# Patient Record
Sex: Female | Born: 1940 | Race: White | Hispanic: No | Smoking: Never smoker
Health system: Southern US, Community
[De-identification: ages and names within clinical notes are randomized; demographics above are authoritative.]

## PROBLEM LIST (undated history)

## (undated) ENCOUNTER — Ambulatory Visit: Source: Home / Self Care

## (undated) DIAGNOSIS — K829 Disease of gallbladder, unspecified: Secondary | ICD-10-CM

## (undated) DIAGNOSIS — K219 Gastro-esophageal reflux disease without esophagitis: Secondary | ICD-10-CM

## (undated) DIAGNOSIS — M549 Dorsalgia, unspecified: Secondary | ICD-10-CM

## (undated) DIAGNOSIS — I499 Cardiac arrhythmia, unspecified: Secondary | ICD-10-CM

## (undated) DIAGNOSIS — R112 Nausea with vomiting, unspecified: Secondary | ICD-10-CM

## (undated) DIAGNOSIS — E669 Obesity, unspecified: Secondary | ICD-10-CM

## (undated) DIAGNOSIS — C801 Malignant (primary) neoplasm, unspecified: Secondary | ICD-10-CM

## (undated) DIAGNOSIS — C50919 Malignant neoplasm of unspecified site of unspecified female breast: Secondary | ICD-10-CM

## (undated) DIAGNOSIS — M1990 Unspecified osteoarthritis, unspecified site: Secondary | ICD-10-CM

## (undated) DIAGNOSIS — M797 Fibromyalgia: Secondary | ICD-10-CM

## (undated) DIAGNOSIS — M2550 Pain in unspecified joint: Secondary | ICD-10-CM

## (undated) DIAGNOSIS — M5430 Sciatica, unspecified side: Secondary | ICD-10-CM

## (undated) DIAGNOSIS — Z9889 Other specified postprocedural states: Secondary | ICD-10-CM

## (undated) DIAGNOSIS — E785 Hyperlipidemia, unspecified: Secondary | ICD-10-CM

## (undated) DIAGNOSIS — M5020 Other cervical disc displacement, unspecified cervical region: Secondary | ICD-10-CM

## (undated) DIAGNOSIS — Z8042 Family history of malignant neoplasm of prostate: Secondary | ICD-10-CM

## (undated) DIAGNOSIS — G2581 Restless legs syndrome: Secondary | ICD-10-CM

## (undated) DIAGNOSIS — R600 Localized edema: Secondary | ICD-10-CM

## (undated) DIAGNOSIS — M069 Rheumatoid arthritis, unspecified: Secondary | ICD-10-CM

## (undated) DIAGNOSIS — Z8719 Personal history of other diseases of the digestive system: Secondary | ICD-10-CM

## (undated) DIAGNOSIS — D649 Anemia, unspecified: Secondary | ICD-10-CM

## (undated) DIAGNOSIS — IMO0001 Reserved for inherently not codable concepts without codable children: Secondary | ICD-10-CM

## (undated) DIAGNOSIS — E119 Type 2 diabetes mellitus without complications: Secondary | ICD-10-CM

## (undated) DIAGNOSIS — Z803 Family history of malignant neoplasm of breast: Secondary | ICD-10-CM

## (undated) DIAGNOSIS — N390 Urinary tract infection, site not specified: Secondary | ICD-10-CM

## (undated) DIAGNOSIS — K5790 Diverticulosis of intestine, part unspecified, without perforation or abscess without bleeding: Secondary | ICD-10-CM

## (undated) HISTORY — DX: Restless legs syndrome: G25.81

## (undated) HISTORY — PX: INCONTINENCE SURGERY: SHX676

## (undated) HISTORY — DX: Other cervical disc displacement, unspecified cervical region: M50.20

## (undated) HISTORY — DX: Malignant neoplasm of unspecified site of unspecified female breast: C50.919

## (undated) HISTORY — DX: Disease of gallbladder, unspecified: K82.9

## (undated) HISTORY — DX: Localized edema: R60.0

## (undated) HISTORY — PX: CYSTOSCOPY WITH BIOPSY: SHX5122

## (undated) HISTORY — DX: Rheumatoid arthritis, unspecified: M06.9

## (undated) HISTORY — PX: OTHER SURGICAL HISTORY: SHX169

## (undated) HISTORY — PX: ABDOMINAL HYSTERECTOMY: SHX81

## (undated) HISTORY — DX: Unspecified osteoarthritis, unspecified site: M19.90

## (undated) HISTORY — PX: CHOLECYSTECTOMY: SHX55

## (undated) HISTORY — DX: Anemia, unspecified: D64.9

## (undated) HISTORY — PX: JOINT REPLACEMENT: SHX530

## (undated) HISTORY — DX: Family history of malignant neoplasm of prostate: Z80.42

## (undated) HISTORY — DX: Malignant (primary) neoplasm, unspecified: C80.1

## (undated) HISTORY — DX: Dorsalgia, unspecified: M54.9

## (undated) HISTORY — DX: Type 2 diabetes mellitus without complications: E11.9

## (undated) HISTORY — DX: Obesity, unspecified: E66.9

## (undated) HISTORY — PX: KNEE ARTHROSCOPY: SUR90

## (undated) HISTORY — PX: ROTATOR CUFF REPAIR: SHX139

## (undated) HISTORY — DX: Pain in unspecified joint: M25.50

## (undated) HISTORY — PX: APPENDECTOMY: SHX54

## (undated) HISTORY — DX: Family history of malignant neoplasm of breast: Z80.3

---

## 1984-11-26 HISTORY — PX: OTHER SURGICAL HISTORY: SHX169

## 1984-11-26 HISTORY — PX: BREAST SURGERY: SHX581

## 2000-02-21 ENCOUNTER — Other Ambulatory Visit: Admission: RE | Admit: 2000-02-21 | Discharge: 2000-02-21 | Payer: Self-pay | Admitting: Obstetrics & Gynecology

## 2001-03-11 ENCOUNTER — Ambulatory Visit (HOSPITAL_BASED_OUTPATIENT_CLINIC_OR_DEPARTMENT_OTHER): Admission: RE | Admit: 2001-03-11 | Discharge: 2001-03-11 | Payer: Self-pay | Admitting: Orthopaedic Surgery

## 2001-03-24 ENCOUNTER — Other Ambulatory Visit: Admission: RE | Admit: 2001-03-24 | Discharge: 2001-03-24 | Payer: Self-pay | Admitting: Obstetrics & Gynecology

## 2002-06-12 ENCOUNTER — Other Ambulatory Visit: Admission: RE | Admit: 2002-06-12 | Discharge: 2002-06-12 | Payer: Self-pay | Admitting: Obstetrics & Gynecology

## 2003-07-09 ENCOUNTER — Other Ambulatory Visit: Admission: RE | Admit: 2003-07-09 | Discharge: 2003-07-09 | Payer: Self-pay | Admitting: Obstetrics & Gynecology

## 2004-08-23 ENCOUNTER — Other Ambulatory Visit: Admission: RE | Admit: 2004-08-23 | Discharge: 2004-08-23 | Payer: Self-pay | Admitting: Obstetrics & Gynecology

## 2005-09-20 ENCOUNTER — Other Ambulatory Visit: Admission: RE | Admit: 2005-09-20 | Discharge: 2005-09-20 | Payer: Self-pay | Admitting: Obstetrics & Gynecology

## 2007-11-27 HISTORY — PX: CARDIAC CATHETERIZATION: SHX172

## 2008-04-01 ENCOUNTER — Encounter: Admission: RE | Admit: 2008-04-01 | Discharge: 2008-04-01 | Payer: Self-pay | Admitting: Neurological Surgery

## 2011-04-13 NOTE — Op Note (Signed)
[place]  Patient:    Kiara Brown, CROSBY                         MRN: [id number] Proc. Date: 03/11/01 Attending:  Lubertha Basque. Jerl Santos, M.D.                           Operative Report  PREOPERATIVE DIAGNOSES: 1. Left knee torn medial meniscus. 2. Left knee arthritis.  POSTOPERATIVE DIAGNOSES: 1. Left knee torn medial meniscus. 2. Left knee arthritis.  PROCEDURES: 1. Left knee partial medial meniscectomy. 2. Left knee removal of loose bodies. 3. Left knee thorough chondroplasty, medial femoral condyle and patella.  ANESTHESIA:  General.  SURGEON:  Lubertha Basque. Jerl Santos, M.D.  ASSISTANT:  Prince Rome, P.A.  INDICATION FOR PROCEDURE:  The patient is a 70 year old woman with a known history of inflammatory arthritis.  She has been having difficulty with severe left knee pain for many months.  This has persisted despite various oral medications and an injection of cortisone, which did afford her transient relief only.  She underwent a preoperative MRI scan, which showed perhaps some degeneration of the medial meniscus with no frank tear.  This did show an effusion.  At this point, she is offered as an arthroscopy, as this has affected her activity level and her ability to rest.  The procedure was discussed with the patient, and informed operative consent was obtained after discussion of possible complications of, reaction to anesthesia, and infection.  DESCRIPTION OF PROCEDURE:  The patient was taken to the operating suite, where general anesthetic was applied without difficulty.  She was positioned supine and prepped and draped in normal sterile fashion.  After the administration of preop IV antibiotics, an arthroscopy of the left knee was performed in two inferior portals.  The suprapatellar pouch was benign, while the patellofemoral joint exhibited some grade 3 chondromalacia on its apex, and a chondroplasty was performed back to stable  structures.  The patella did track very well.  In the medial compartment, she had some degenerative tearing of the posterior horn.  This was addressed with a 10% partial medial meniscectomy.  She had some grade 3 chondromalacia on the medial femoral condyle, but most of this was grade 2.  She had several large cartilaginous loose bodies in the knee.  A few of these were removed from the area of the notch.  The lateral compartment was benign, with no evidence of meniscal or articular cartilage injury.  The knee was thoroughly irrigated at the end of the case, followed by placement of Marcaine with epinephrine and morphine plus a cortisone preparation.  Adaptic was placed over her portals, followed by dry gauze and a loose Ace wrap.  Estimated blood loss and intraoperative fluids can be obtained from the anesthesia records.  DISPOSITION:  The patient was extubated in the operating room and taken to the recovery room in stable condition.  Plans were for her to go home the same day and follow up in the office in less than a week.  I will contact her by phone tonight. DD:  03/11/01 TD:  03/12/01 Job: [id number]

## 2012-07-07 ENCOUNTER — Other Ambulatory Visit: Payer: Self-pay | Admitting: Orthopedic Surgery

## 2012-07-07 MED ORDER — DEXAMETHASONE SODIUM PHOSPHATE 10 MG/ML IJ SOLN: 10.0000 mg | Freq: Once | INTRAMUSCULAR | Status: DC

## 2012-07-07 MED ORDER — BUPIVACAINE 0.25 % ON-Q PUMP SINGLE CATH 300ML: 300.0000 mL | INJECTION | Status: DC

## 2012-07-07 NOTE — Progress Notes (Signed)
Preoperative surgical orders have been place into the Epic hospital system for Kiara Brown on 07/07/2012, 8:24 AM  by Patrica Duel for surgery on 08/04/2012.  Preop Total Knee orders including Bupivacaine On-Q pump, IV Tylenol, and IV Decadron as long as there are no contraindications to the above medications. Avel Peace, PA-C

## 2012-07-21 ENCOUNTER — Encounter (HOSPITAL_COMMUNITY): Payer: Self-pay | Admitting: Pharmacy Technician

## 2012-07-29 ENCOUNTER — Encounter (HOSPITAL_COMMUNITY): Payer: Self-pay

## 2012-07-29 ENCOUNTER — Encounter (HOSPITAL_COMMUNITY)
Admission: RE | Admit: 2012-07-29 | Discharge: 2012-07-29 | Disposition: A | Discharge status: Home / Self Care | Payer: Medicare Other | Source: Ambulatory Visit | Attending: Orthopedic Surgery | Admitting: Orthopedic Surgery

## 2012-07-29 ENCOUNTER — Ambulatory Visit (HOSPITAL_COMMUNITY)
Admission: RE | Admit: 2012-07-29 | Discharge: 2012-07-29 | Disposition: A | Discharge status: Home / Self Care | Payer: Medicare Other | Source: Ambulatory Visit | Attending: Orthopedic Surgery | Admitting: Orthopedic Surgery

## 2012-07-29 DIAGNOSIS — M1710 Unilateral primary osteoarthritis, unspecified knee: Secondary | ICD-10-CM | POA: Insufficient documentation

## 2012-07-29 DIAGNOSIS — Z01812 Encounter for preprocedural laboratory examination: Secondary | ICD-10-CM | POA: Insufficient documentation

## 2012-07-29 HISTORY — DX: Sciatica, unspecified side: M54.30

## 2012-07-29 HISTORY — DX: Hyperlipidemia, unspecified: E78.5

## 2012-07-29 HISTORY — DX: Personal history of other diseases of the digestive system: Z87.19

## 2012-07-29 HISTORY — DX: Other specified postprocedural states: Z98.890

## 2012-07-29 HISTORY — DX: Diverticulosis of intestine, part unspecified, without perforation or abscess without bleeding: K57.90

## 2012-07-29 HISTORY — DX: Unspecified osteoarthritis, unspecified site: M19.90

## 2012-07-29 HISTORY — DX: Nausea with vomiting, unspecified: R11.2

## 2012-07-29 HISTORY — DX: Fibromyalgia: M79.7

## 2012-07-29 LAB — SURGICAL PCR SCREEN: MRSA, PCR: NEGATIVE

## 2012-07-29 LAB — COMPREHENSIVE METABOLIC PANEL
AST: 37 U/L (ref 0–37)
Albumin: 3.7 g/dL (ref 3.5–5.2)
Calcium: 9.1 mg/dL (ref 8.4–10.5)
Creatinine, Ser: 0.91 mg/dL (ref 0.50–1.10)
Total Protein: 7.1 g/dL (ref 6.0–8.3)

## 2012-07-29 LAB — URINALYSIS, ROUTINE W REFLEX MICROSCOPIC
Protein, ur: NEGATIVE mg/dL
Urobilinogen, UA: 0.2 mg/dL (ref 0.0–1.0)

## 2012-07-29 LAB — CBC
MCH: 31.4 pg (ref 26.0–34.0)
MCV: 93.6 fL (ref 78.0–100.0)
Platelets: 326 10*3/uL (ref 150–400)
RDW: 13.5 % (ref 11.5–15.5)
WBC: 6.5 10*3/uL (ref 4.0–10.5)

## 2012-07-29 LAB — URINE MICROSCOPIC-ADD ON

## 2012-07-29 LAB — PROTIME-INR: INR: 0.98 (ref 0.00–1.49)

## 2012-07-29 NOTE — Progress Notes (Signed)
07/29/12 1119  OBSTRUCTIVE SLEEP APNEA  Have you ever been diagnosed with sleep apnea through a sleep study? No  Do you snore loudly (loud enough to be heard through closed doors)?  1  Do you often feel tired, fatigued, or sleepy during the daytime? 1  Has anyone observed you stop breathing during your sleep? 0  Do you have, or are you being treated for high blood pressure? 1  BMI more than 35 kg/m2? 1  Age over 71 years old? 1  Neck circumference greater than 40 cm/18 inches? 0  Gender: 0  Obstructive Sleep Apnea Score 5   Score 4 or greater  Updated health history;Results sent to PCP

## 2012-07-29 NOTE — Patient Instructions (Addendum)
20 AMANDEEP HOGSTON  07/29/2012   Your procedure is scheduled on:  08/04/12 Monday  Surgery [id number]  Report to [facility] at    1315  PM     1:15 PM  Call this number if you have problems the morning of surgery: [phone]     Or PST   [id number]  [facility]   Remember:   Do not eat food:After Midnight. Sunday NIGHT  May have clear liquids: UNTIL  0715 AM  Monday  THEN NONE  Clear liquids include soda, tea, black coffee, apple or grape juice, broth.  Take these medicines the morning of surgery with A SIP OF WATER:     ATENOLOL, WELLBUTRIN                                 TRAMADOL IF NEEDED   Do not wear jewelry, make-up or nail polish.  Do not wear lotions, powders, or perfumes. You may wear deodorant.  Do not shave 48 hours prior to surgery.  Do not bring valuables to the hospital.  Contacts, dentures or bridgework may not be worn into surgery.  Leave suitcase in the car. After surgery it may be brought to your room.  For patients admitted to the hospital, checkout time is 11:00 AM the day of discharge.   Patients discharged the day of surgery will not be allowed to drive home.  Name and phone number of your driver: [facility]                                                                     Special Instructions: CHG Shower Use Special Wash: 1/2 bottle night before surgery and 1/2 bottle morning of surgery. REGULAR SOAP FACE AND PRIVATES              LADIES- NO SHAVING 48 HOURS BEFORE USING BETASEPT SOAP.       Please read over the following fact sheets that you were given: MRSA Information

## 2012-07-29 NOTE — Pre-Procedure Instructions (Signed)
Faxed copy of labs to Dr Lequita Halt with confirmation and request to review urine with micro and positive antibioties

## 2012-07-31 NOTE — Pre-Procedure Instructions (Signed)
Received fax from Dr Lequita Halt- no action re labs from 07/29/12

## 2012-08-03 ENCOUNTER — Other Ambulatory Visit: Payer: Self-pay | Admitting: Orthopedic Surgery

## 2012-08-03 NOTE — H&P (Signed)
Kiara Brown  DOB: 14-Mar-1941 Married / Language: English / Race: White Female  Date of Admission:  08/04/2012  Chief Complaint:  Left Knee Pain  History of Present Illness The patient is a 71 year old female who comes in for a preoperative History and Physical. The patient is scheduled for a left total knee arthroplasty to be performed by Dr. Gus Rankin. Aluisio, MD at [facility] on 08/04/2012. The patient is a 71 year old female who presents with knee complaints. The patient is seen today for a second opinion. The patient reports left knee and right knee symptoms including: pain, instability, stiffness and grinding which began 1 year(s) ago without any known injury.The patient feels that the symptoms are worsening. The patient has the current diagnosis of knee osteoarthritis. Prior to being seen today the patient was previously evaluated by a colleague. Previous work-up for this problem has included knee x-rays and arthroscopy (on the left in 2002, and right in 2004). Past treatment for this problem has included intra-articular injection of corticosteroids (as well as Synvisc. The Synvisc didn't seem to help at all. Her last cortisone injection was in April.). Current treatment includes application of ice, nonsteroidal anti-inflammatory drugs (Celebrex) and non-opioid analgesics (Tylenol). Glena Norfolk has recently been treated by Dr. Thamas Jaegers in [facility]. He did the injections. Unfortunately, her knee is at a stage where it is hurting her all the time. This is preventing her from doing things she desires. She has pain in it day and night. The left knee is far worse than the right but her right knee hurts also. The knees are limiting her activities. She was told by Dr. Thamas Jaegers that she was too obese to have the knee replacement and she would be at high risk for infection and amputation. She is subsequently here today for a second opinion. She is ready to now proceed with  surgery. They have been treated conservatively in the past for the above stated problem and despite conservative measures, they continue to have progressive pain and severe functional limitations and dysfunction. They have failed non-operative management including home exercise, medications, and injections. It is felt that they would benefit from undergoing total joint replacement. Risks and benefits of the procedure have been discussed with the patient and they elect to proceed with surgery. There are no active contraindications to surgery such as ongoing infection or rapidly progressive neurological disease.  Problem List/Past Medical Osteoarthritis, Knee (715.96) Rheumatoid Arthritis Fibromyalgia Osteoarthritis Chronic Pain Diverticulosis Hypertension Peripheral Neuropathy Hyperlipidemia. Diet-controlled  Allergies No Known Drug Allergies. 05/22/2012   Family History Diabetes Mellitus. mother, sister and brother Heart Disease. mother, father, brother, grandfather mothers side and grandfather fathers side Heart disease in female family member before age 58 Congestive Heart Failure. mother and brother Cancer. mother, sister and brother Cerebrovascular Accident. mother, brother and grandfather fathers side Chronic Obstructive Lung Disease. brother Heart disease in female family member before age 33 Hypertension. mother and brother Osteoporosis. brother Sister 1. Living. age 52, Myasthenia Gravis   Social History Exercise. Exercises weekly; does other Drug/Alcohol Rehab (Currently). no Living situation. live with spouse Illicit drug use. no Alcohol use. never consumed alcohol Current work status. retired Copywriter, advertising. 2 Tobacco use. never smoker Tobacco / smoke exposure. no Number of flights of stairs before winded. less than 1 Marital status. married Previously in rehab. no Pain Contract. no   Medication History Orencia (250MG  For Solution,  Intravenous) Active. Gabapentin (100MG  Capsule, Oral) Active. BuPROPion HCl ER (XL) (150MG  Tablet  ER 24HR, Oral) Active. CeleBREX (200MG  Capsule, Oral) Active. TraMADol HCl (50MG  Tablet, Oral) Active. Atenolol (50MG  Tablet, Oral) Active. Folic Acid (1MG  Tablet, Oral) Active. Aspirin Childrens (81MG  Tablet Chewable, Oral) Active. Vitamin ( Oral daily) Active.   Pregnancy / Birth History Pregnant. no   Past Surgical History Arthroscopy of Knee. bilateral Arthroscopy of Shoulder. right Breast Biopsy. left; Benign Gallbladder Surgery. laporoscopic Hysterectomy. Date: 66. partial (non-cancerous) Other Surgery. Ulnar nerve release on right arm Rotator Cuff Repair. right Appendectomy. Date: 53. Done at same time of Hysterectomy Cystoscopy. Benign Tumor Removal and Sling Placement   Review of Systems General:Present- Weight Gain. Not Present- Chills, Fever, Night Sweats, Appetite Loss, Fatigue, Feeling sick and Weight Loss. Skin:Not Present- Itching, Rash, Skin Color Changes, Ulcer, Psoriasis and Change in Hair or Nails. HEENT:Not Present- Sensitivity to light, Nose Bleed, Visual Loss, Decreased Hearing and Ringing in the Ears. Neck:Not Present- Swollen Glands and Neck Mass. Respiratory:Present- Shortness of breath. Not Present- Snoring, Chronic Cough and Bloody sputum. Cardiovascular:Present- Shortness of Breath and Swelling of Extremities. Not Present- Chest Pain, Leg Cramps and Palpitations. Gastrointestinal:Present- Heartburn. Not Present- Bloody Stool, Abdominal Pain, Vomiting, Nausea and Incontinence of Stool. Musculoskeletal:Present- Joint Stiffness, Joint Swelling, Joint Pain and Back Pain. Not Present- Muscle Weakness and Muscle Pain. Neurological:Present- Tingling, Numbness and Burning. Not Present- Tremor, Headaches and Dizziness. Psychiatric:Not Present- Anxiety, Depression and Memory Loss. Endocrine:Not Present- Cold Intolerance, Heat Intolerance,  Excessive hunger and Excessive Thirst.   Vitals Weight: 240 lb Height: 64 in Body Surface Area: 2.22 m Body Mass Index: 41.2 kg/m Pulse: 76 (Regular) Resp.: 12 (Unlabored) BP: 142/82 (Sitting, Right Arm, Standard)   Physical Exam The physical exam findings are as follows:   General Mental Status - Alert, cooperative and good historian. General Appearance- pleasant. Not in acute distress. Orientation- Oriented X3. Build & Nutrition- Well nourished and Well developed.   Head and Neck Head- normocephalic, atraumatic . Neck Global Assessment- supple. no bruit auscultated on the right and no bruit auscultated on the left.   Eye Pupil- Bilateral- Regular and Round. Motion- Bilateral- EOMI. Patient does wear contacts.  Chest and Lung Exam Auscultation: Breath sounds:- clear at anterior chest wall and - clear at posterior chest wall. Adventitious sounds:- No Adventitious sounds.   Cardiovascular Auscultation:Rhythm- Regular rate and rhythm. Heart Sounds- S1 WNL and S2 WNL. Murmurs & Other Heart Sounds:Auscultation of the heart reveals - No Murmurs.   Abdomen Inspection:Contour- Generalized moderate distention. Palpation/Percussion:Tenderness- Abdomen is non-tender to palpation. Rigidity (guarding)- Abdomen is soft. Auscultation:Auscultation of the abdomen reveals - Bowel sounds normal.   Female Genitourinary Not done, not pertinent to present illness  Musculoskeletal She is a very pleasant, well developed female who is alert and oriented. No apparent distress. Both knees show no effusion. She does have significant morbid obesity with a lot of adipose distribution about her knees. Range is about 5-120 on each side. There is marked crepitus on range of motion. She does not have any peripheral edema. There is no instability noted about the knees.  RADIOGRAPHS: AP and lateral of both knees show advanced endstage arthritis in  both, mainly affecting the medial and patellofemoral compartments with bone on bone changes. She is a little worse on the left than the right. She also has tibial subluxation.  Assessment & Plan Osteoarthritis, Knee (715.96) Impression: Left Knee  Note: Patient is for a left total knee repalcement by Dr. Lequita Halt.  Patient wants to look into rehab.  PCP - Dr. Gildardo Griffes - Patient has  been seen preoperatively and felt to be stable for surgery.  Signed electronically by Roberts Gaudy, PA-C

## 2012-08-04 ENCOUNTER — Encounter (HOSPITAL_COMMUNITY): Payer: Self-pay | Admitting: Anesthesiology

## 2012-08-04 ENCOUNTER — Encounter (HOSPITAL_COMMUNITY): Payer: Self-pay | Admitting: *Deleted

## 2012-08-04 ENCOUNTER — Encounter (HOSPITAL_COMMUNITY)
Admission: RE | Disposition: A | Discharge status: Skilled Nursing Facility | Payer: Self-pay | Source: Ambulatory Visit | Attending: Orthopedic Surgery

## 2012-08-04 ENCOUNTER — Inpatient Hospital Stay (HOSPITAL_COMMUNITY): Payer: Medicare Other | Admitting: Anesthesiology

## 2012-08-04 ENCOUNTER — Inpatient Hospital Stay (HOSPITAL_COMMUNITY)
Admission: RE | Admit: 2012-08-04 | Discharge: 2012-08-07 | DRG: 470 | Disposition: A | Discharge status: Skilled Nursing Facility | Payer: Medicare Other | Source: Ambulatory Visit | Attending: Orthopedic Surgery | Admitting: Orthopedic Surgery

## 2012-08-04 DIAGNOSIS — Z96659 Presence of unspecified artificial knee joint: Secondary | ICD-10-CM

## 2012-08-04 DIAGNOSIS — Z6841 Body Mass Index (BMI) 40.0 and over, adult: Secondary | ICD-10-CM

## 2012-08-04 DIAGNOSIS — Z7982 Long term (current) use of aspirin: Secondary | ICD-10-CM

## 2012-08-04 DIAGNOSIS — Z90710 Acquired absence of both cervix and uterus: Secondary | ICD-10-CM

## 2012-08-04 DIAGNOSIS — G5600 Carpal tunnel syndrome, unspecified upper limb: Secondary | ICD-10-CM | POA: Diagnosis present

## 2012-08-04 DIAGNOSIS — M069 Rheumatoid arthritis, unspecified: Secondary | ICD-10-CM | POA: Diagnosis present

## 2012-08-04 DIAGNOSIS — M1710 Unilateral primary osteoarthritis, unspecified knee: Principal | ICD-10-CM | POA: Diagnosis present

## 2012-08-04 DIAGNOSIS — R110 Nausea: Secondary | ICD-10-CM | POA: Diagnosis not present

## 2012-08-04 DIAGNOSIS — M179 Osteoarthritis of knee, unspecified: Secondary | ICD-10-CM | POA: Diagnosis present

## 2012-08-04 DIAGNOSIS — Z79899 Other long term (current) drug therapy: Secondary | ICD-10-CM

## 2012-08-04 DIAGNOSIS — G8929 Other chronic pain: Secondary | ICD-10-CM | POA: Diagnosis present

## 2012-08-04 DIAGNOSIS — K449 Diaphragmatic hernia without obstruction or gangrene: Secondary | ICD-10-CM | POA: Diagnosis present

## 2012-08-04 DIAGNOSIS — I10 Essential (primary) hypertension: Secondary | ICD-10-CM | POA: Diagnosis present

## 2012-08-04 DIAGNOSIS — Z9089 Acquired absence of other organs: Secondary | ICD-10-CM

## 2012-08-04 DIAGNOSIS — M5430 Sciatica, unspecified side: Secondary | ICD-10-CM | POA: Diagnosis present

## 2012-08-04 DIAGNOSIS — E785 Hyperlipidemia, unspecified: Secondary | ICD-10-CM | POA: Diagnosis present

## 2012-08-04 DIAGNOSIS — G609 Hereditary and idiopathic neuropathy, unspecified: Secondary | ICD-10-CM | POA: Diagnosis present

## 2012-08-04 DIAGNOSIS — E876 Hypokalemia: Secondary | ICD-10-CM | POA: Diagnosis not present

## 2012-08-04 DIAGNOSIS — Z8719 Personal history of other diseases of the digestive system: Secondary | ICD-10-CM

## 2012-08-04 DIAGNOSIS — G4730 Sleep apnea, unspecified: Secondary | ICD-10-CM | POA: Diagnosis present

## 2012-08-04 DIAGNOSIS — E871 Hypo-osmolality and hyponatremia: Secondary | ICD-10-CM | POA: Diagnosis not present

## 2012-08-04 HISTORY — PX: TOTAL KNEE ARTHROPLASTY: SHX125

## 2012-08-04 SURGERY — ARTHROPLASTY, KNEE, TOTAL
Anesthesia: Spinal | Site: Knee | Laterality: Left | Wound class: Clean

## 2012-08-04 MED ORDER — CEFAZOLIN SODIUM-DEXTROSE 2-3 GM-% IV SOLR
2.0000 g | Freq: Four times a day (QID) | INTRAVENOUS | Status: AC
  Administered 2012-08-04 – 2012-08-05 (×2): 2 g via INTRAVENOUS
  Filled 2012-08-04 (×2): qty 50

## 2012-08-04 MED ORDER — ONDANSETRON HCL 4 MG/2ML IJ SOLN: 4.0000 mg | Freq: Four times a day (QID) | INTRAMUSCULAR | Status: DC | PRN

## 2012-08-04 MED ORDER — ONDANSETRON HCL 4 MG PO TABS: 4.0000 mg | ORAL_TABLET | Freq: Four times a day (QID) | ORAL | Status: DC | PRN

## 2012-08-04 MED ORDER — DIPHENHYDRAMINE HCL 12.5 MG/5ML PO ELIX
12.5000 mg | ORAL_SOLUTION | Freq: Four times a day (QID) | ORAL | Status: DC | PRN
  Filled 2012-08-04: qty 5

## 2012-08-04 MED ORDER — SODIUM CHLORIDE 0.9 % IR SOLN
Status: DC | PRN
  Administered 2012-08-04: 1000 mL

## 2012-08-04 MED ORDER — PHENYLEPHRINE HCL 10 MG/ML IJ SOLN
INTRAMUSCULAR | Status: DC | PRN
  Administered 2012-08-04: 80 ug via INTRAVENOUS
  Administered 2012-08-04: 160 ug via INTRAVENOUS

## 2012-08-04 MED ORDER — OXYCODONE HCL 5 MG PO TABS
5.0000 mg | ORAL_TABLET | ORAL | Status: DC | PRN
  Administered 2012-08-04 – 2012-08-05 (×3): 5 mg via ORAL
  Administered 2012-08-05 (×4): 10 mg via ORAL
  Administered 2012-08-06 (×2): 5 mg via ORAL
  Filled 2012-08-04: qty 2
  Filled 2012-08-04 (×3): qty 1
  Filled 2012-08-04: qty 2
  Filled 2012-08-04: qty 1
  Filled 2012-08-04: qty 2
  Filled 2012-08-04: qty 1
  Filled 2012-08-04: qty 2

## 2012-08-04 MED ORDER — MEPERIDINE HCL 50 MG/ML IJ SOLN
6.2500 mg | INTRAMUSCULAR | Status: DC | PRN
  Administered 2012-08-04: 12.5 mg via INTRAVENOUS

## 2012-08-04 MED ORDER — RIVAROXABAN 10 MG PO TABS
10.0000 mg | ORAL_TABLET | Freq: Every day | ORAL | Status: DC
  Administered 2012-08-05 – 2012-08-07 (×3): 10 mg via ORAL
  Filled 2012-08-04 (×5): qty 1

## 2012-08-04 MED ORDER — PHENOL 1.4 % MT LIQD
1.0000 | OROMUCOSAL | Status: DC | PRN
Start: 2012-08-04 — End: 2012-08-07
  Filled 2012-08-04: qty 177

## 2012-08-04 MED ORDER — PROMETHAZINE HCL 25 MG/ML IJ SOLN: 6.2500 mg | INTRAMUSCULAR | Status: DC | PRN

## 2012-08-04 MED ORDER — BUPIVACAINE ON-Q PAIN PUMP (FOR ORDER SET NO CHG)
INJECTION | Status: DC
  Filled 2012-08-04: qty 1

## 2012-08-04 MED ORDER — LACTATED RINGERS IV SOLN
INTRAVENOUS | Status: DC | PRN
  Administered 2012-08-04 (×3): via INTRAVENOUS

## 2012-08-04 MED ORDER — LACTATED RINGERS IV SOLN: INTRAVENOUS | Status: DC

## 2012-08-04 MED ORDER — MORPHINE SULFATE (PF) 1 MG/ML IV SOLN
INTRAVENOUS | Status: DC
  Administered 2012-08-04: 22:00:00 via INTRAVENOUS
  Administered 2012-08-04: 14 mg via INTRAVENOUS
  Administered 2012-08-05: 16 mg via INTRAVENOUS
  Filled 2012-08-04: qty 25

## 2012-08-04 MED ORDER — MORPHINE SULFATE (PF) 1 MG/ML IV SOLN
INTRAVENOUS | Status: DC
  Administered 2012-08-04: 1 mg via INTRAVENOUS

## 2012-08-04 MED ORDER — POLYETHYLENE GLYCOL 3350 17 G PO PACK: 17.0000 g | PACK | Freq: Every day | ORAL | Status: DC | PRN

## 2012-08-04 MED ORDER — ACETAMINOPHEN 10 MG/ML IV SOLN
1000.0000 mg | Freq: Four times a day (QID) | INTRAVENOUS | Status: AC
  Administered 2012-08-04 – 2012-08-05 (×4): 1000 mg via INTRAVENOUS
  Filled 2012-08-04 (×7): qty 100

## 2012-08-04 MED ORDER — SODIUM CHLORIDE 0.9 % IV SOLN: INTRAVENOUS | Status: DC

## 2012-08-04 MED ORDER — DIPHENHYDRAMINE HCL 12.5 MG/5ML PO ELIX: 12.5000 mg | ORAL_SOLUTION | Freq: Four times a day (QID) | ORAL | Status: DC | PRN

## 2012-08-04 MED ORDER — DOCUSATE SODIUM 100 MG PO CAPS
100.0000 mg | ORAL_CAPSULE | Freq: Two times a day (BID) | ORAL | Status: DC
Start: 2012-08-04 — End: 2012-08-07
  Administered 2012-08-04 – 2012-08-06 (×5): 100 mg via ORAL

## 2012-08-04 MED ORDER — METHOCARBAMOL 500 MG PO TABS
500.0000 mg | ORAL_TABLET | Freq: Four times a day (QID) | ORAL | Status: DC | PRN
  Administered 2012-08-05 – 2012-08-07 (×6): 500 mg via ORAL
  Filled 2012-08-04 (×7): qty 1

## 2012-08-04 MED ORDER — MENTHOL 3 MG MT LOZG
1.0000 | LOZENGE | OROMUCOSAL | Status: DC | PRN
  Filled 2012-08-04: qty 9

## 2012-08-04 MED ORDER — GABAPENTIN 100 MG PO CAPS
100.0000 mg | ORAL_CAPSULE | Freq: Every day | ORAL | Status: DC
  Administered 2012-08-04 – 2012-08-06 (×3): 100 mg via ORAL
  Filled 2012-08-04 (×4): qty 1

## 2012-08-04 MED ORDER — SODIUM CHLORIDE 0.9 % IJ SOLN: 9.0000 mL | INTRAMUSCULAR | Status: DC | PRN

## 2012-08-04 MED ORDER — METOCLOPRAMIDE HCL 5 MG/ML IJ SOLN
5.0000 mg | Freq: Three times a day (TID) | INTRAMUSCULAR | Status: DC | PRN
  Administered 2012-08-05: 10 mg via INTRAVENOUS
  Filled 2012-08-04: qty 2

## 2012-08-04 MED ORDER — BUPIVACAINE IN DEXTROSE 0.75-8.25 % IT SOLN
INTRATHECAL | Status: DC | PRN
  Administered 2012-08-04: 1.8 mL via INTRATHECAL

## 2012-08-04 MED ORDER — HYDROMORPHONE HCL PF 1 MG/ML IJ SOLN: 0.2500 mg | INTRAMUSCULAR | Status: DC | PRN

## 2012-08-04 MED ORDER — DIPHENHYDRAMINE HCL 50 MG/ML IJ SOLN: 12.5000 mg | Freq: Four times a day (QID) | INTRAMUSCULAR | Status: DC | PRN

## 2012-08-04 MED ORDER — TRAMADOL HCL 50 MG PO TABS
50.0000 mg | ORAL_TABLET | Freq: Four times a day (QID) | ORAL | Status: DC | PRN
  Administered 2012-08-06: 50 mg via ORAL
  Filled 2012-08-04: qty 1

## 2012-08-04 MED ORDER — 0.9 % SODIUM CHLORIDE (POUR BTL) OPTIME
TOPICAL | Status: DC | PRN
  Administered 2012-08-04: 1000 mL

## 2012-08-04 MED ORDER — NALOXONE HCL 0.4 MG/ML IJ SOLN: 0.4000 mg | INTRAMUSCULAR | Status: DC | PRN

## 2012-08-04 MED ORDER — FENTANYL CITRATE 0.05 MG/ML IJ SOLN
INTRAMUSCULAR | Status: DC | PRN
  Administered 2012-08-04: 50 ug via INTRAVENOUS

## 2012-08-04 MED ORDER — MIDAZOLAM HCL 5 MG/5ML IJ SOLN
INTRAMUSCULAR | Status: DC | PRN
  Administered 2012-08-04: 2 mg via INTRAVENOUS

## 2012-08-04 MED ORDER — MEPERIDINE HCL 50 MG/ML IJ SOLN
INTRAMUSCULAR | Status: AC
  Filled 2012-08-04: qty 1

## 2012-08-04 MED ORDER — METOCLOPRAMIDE HCL 10 MG PO TABS
5.0000 mg | ORAL_TABLET | Freq: Three times a day (TID) | ORAL | Status: DC | PRN
  Administered 2012-08-05: 10 mg via ORAL
  Filled 2012-08-04: qty 1

## 2012-08-04 MED ORDER — ACETAMINOPHEN 650 MG RE SUPP: 650.0000 mg | Freq: Four times a day (QID) | RECTAL | Status: DC | PRN

## 2012-08-04 MED ORDER — PROPOFOL INFUSION 10 MG/ML OPTIME
INTRAVENOUS | Status: DC | PRN
  Administered 2012-08-04: 100 ug/kg/min via INTRAVENOUS

## 2012-08-04 MED ORDER — LIDOCAINE HCL (CARDIAC) 20 MG/ML IV SOLN
INTRAVENOUS | Status: DC | PRN
  Administered 2012-08-04: 100 mg via INTRAVENOUS

## 2012-08-04 MED ORDER — POTASSIUM CHLORIDE IN NACL 20-0.9 MEQ/L-% IV SOLN
INTRAVENOUS | Status: DC
  Administered 2012-08-05: 04:00:00 via INTRAVENOUS
  Filled 2012-08-04 (×4): qty 1000

## 2012-08-04 MED ORDER — ACETAMINOPHEN 325 MG PO TABS: 650.0000 mg | ORAL_TABLET | Freq: Four times a day (QID) | ORAL | Status: DC | PRN

## 2012-08-04 MED ORDER — BUPIVACAINE 0.25 % ON-Q PUMP SINGLE CATH 100 ML
INJECTION | Status: DC | PRN
  Administered 2012-08-04: 100 mL

## 2012-08-04 MED ORDER — ATENOLOL 50 MG PO TABS
50.0000 mg | ORAL_TABLET | Freq: Every day | ORAL | Status: DC
  Administered 2012-08-05 – 2012-08-07 (×3): 50 mg via ORAL
  Filled 2012-08-04 (×4): qty 1

## 2012-08-04 MED ORDER — DIPHENHYDRAMINE HCL 12.5 MG/5ML PO ELIX: 12.5000 mg | ORAL_SOLUTION | ORAL | Status: DC | PRN

## 2012-08-04 MED ORDER — ONDANSETRON HCL 4 MG/2ML IJ SOLN
4.0000 mg | Freq: Four times a day (QID) | INTRAMUSCULAR | Status: DC | PRN
  Administered 2012-08-04 – 2012-08-05 (×2): 4 mg via INTRAVENOUS
  Filled 2012-08-04 (×2): qty 2

## 2012-08-04 MED ORDER — DEXTROSE 5 % IV SOLN
3.0000 g | INTRAVENOUS | Status: AC
  Administered 2012-08-04: 2 g via INTRAVENOUS
  Filled 2012-08-04: qty 3000

## 2012-08-04 MED ORDER — EPHEDRINE SULFATE 50 MG/ML IJ SOLN
INTRAMUSCULAR | Status: DC | PRN
  Administered 2012-08-04: 5 mg via INTRAVENOUS
  Administered 2012-08-04: 10 mg via INTRAVENOUS

## 2012-08-04 MED ORDER — ACETAMINOPHEN 10 MG/ML IV SOLN
1000.0000 mg | Freq: Once | INTRAVENOUS | Status: AC
  Administered 2012-08-04: 1000 mg via INTRAVENOUS

## 2012-08-04 MED ORDER — BUPROPION HCL ER (XL) 150 MG PO TB24
150.0000 mg | ORAL_TABLET | Freq: Every day | ORAL | Status: DC
  Administered 2012-08-05 – 2012-08-07 (×3): 150 mg via ORAL
  Filled 2012-08-04 (×4): qty 1

## 2012-08-04 MED ORDER — METHOCARBAMOL 100 MG/ML IJ SOLN
500.0000 mg | Freq: Four times a day (QID) | INTRAVENOUS | Status: DC | PRN
  Administered 2012-08-04: 500 mg via INTRAVENOUS
  Filled 2012-08-04: qty 5

## 2012-08-04 MED ORDER — FLEET ENEMA 7-19 GM/118ML RE ENEM: 1.0000 | ENEMA | Freq: Once | RECTAL | Status: AC | PRN

## 2012-08-04 MED ORDER — BISACODYL 10 MG RE SUPP: 10.0000 mg | Freq: Every day | RECTAL | Status: DC | PRN

## 2012-08-04 SURGICAL SUPPLY — 56 items
BAG SPEC THK2 15X12 ZIP CLS (MISCELLANEOUS) ×1
BAG ZIPLOCK 12X15 (MISCELLANEOUS) ×2 IMPLANT
BANDAGE ELASTIC 6 VELCRO ST LF (GAUZE/BANDAGES/DRESSINGS) ×2 IMPLANT
BANDAGE ESMARK 6X9 LF (GAUZE/BANDAGES/DRESSINGS) ×1 IMPLANT
BLADE SAG 18X100X1.27 (BLADE) ×2 IMPLANT
BLADE SAW SGTL 11.0X1.19X90.0M (BLADE) ×2 IMPLANT
BNDG CMPR 9X6 STRL LF SNTH (GAUZE/BANDAGES/DRESSINGS) ×1
BNDG ESMARK 6X9 LF (GAUZE/BANDAGES/DRESSINGS) ×2
BOWL SMART MIX CTS (DISPOSABLE) ×2 IMPLANT
CATH KIT ON-Q SILVERSOAK 5 (CATHETERS) ×1 IMPLANT
CATH KIT ON-Q SILVERSOAK 5IN (CATHETERS) ×2 IMPLANT
CEMENT HV SMART SET (Cement) ×4 IMPLANT
CLOTH BEACON ORANGE TIMEOUT ST (SAFETY) ×2 IMPLANT
CUFF TOURN SGL QUICK 34 (TOURNIQUET CUFF)
CUFF TOURN SGL QUICK 44 (TOURNIQUET CUFF) ×1 IMPLANT
CUFF TRNQT CYL 34X4X40X1 (TOURNIQUET CUFF) ×1 IMPLANT
DRAPE EXTREMITY T 121X128X90 (DRAPE) ×2 IMPLANT
DRAPE POUCH INSTRU U-SHP 10X18 (DRAPES) ×2 IMPLANT
DRAPE U-SHAPE 47X51 STRL (DRAPES) ×2 IMPLANT
DRSG ADAPTIC 3X8 NADH LF (GAUZE/BANDAGES/DRESSINGS) ×2 IMPLANT
DURAPREP 26ML APPLICATOR (WOUND CARE) ×2 IMPLANT
ELECT REM PT RETURN 9FT ADLT (ELECTROSURGICAL) ×2
ELECTRODE REM PT RTRN 9FT ADLT (ELECTROSURGICAL) ×1 IMPLANT
EVACUATOR 1/8 PVC DRAIN (DRAIN) ×2 IMPLANT
FACESHIELD LNG OPTICON STERILE (SAFETY) ×10 IMPLANT
GLOVE BIO SURGEON STRL SZ8 (GLOVE) ×2 IMPLANT
GLOVE BIOGEL PI IND STRL 6.5 (GLOVE) IMPLANT
GLOVE BIOGEL PI IND STRL 8 (GLOVE) ×2 IMPLANT
GLOVE BIOGEL PI INDICATOR 6.5 (GLOVE) ×1
GLOVE BIOGEL PI INDICATOR 8 (GLOVE) ×2
GLOVE ECLIPSE 8.0 STRL XLNG CF (GLOVE) ×2 IMPLANT
GLOVE SURG SS PI 6.5 STRL IVOR (GLOVE) ×5 IMPLANT
GOWN STRL NON-REIN LRG LVL3 (GOWN DISPOSABLE) ×4 IMPLANT
GOWN STRL REIN XL XLG (GOWN DISPOSABLE) ×3 IMPLANT
HANDPIECE INTERPULSE COAX TIP (DISPOSABLE) ×2
IMMOBILIZER KNEE 20 (SOFTGOODS) ×2
IMMOBILIZER KNEE 20 THIGH 36 (SOFTGOODS) ×1 IMPLANT
KIT BASIN OR (CUSTOM PROCEDURE TRAY) ×2 IMPLANT
MANIFOLD NEPTUNE II (INSTRUMENTS) ×2 IMPLANT
NS IRRIG 1000ML POUR BTL (IV SOLUTION) ×2 IMPLANT
PACK TOTAL JOINT (CUSTOM PROCEDURE TRAY) ×2 IMPLANT
PAD ABD 7.5X8 STRL (GAUZE/BANDAGES/DRESSINGS) ×2 IMPLANT
PADDING CAST COTTON 6X4 STRL (CAST SUPPLIES) ×4 IMPLANT
POSITIONER SURGICAL ARM (MISCELLANEOUS) ×2 IMPLANT
SET HNDPC FAN SPRY TIP SCT (DISPOSABLE) ×1 IMPLANT
SPONGE GAUZE 4X4 12PLY (GAUZE/BANDAGES/DRESSINGS) ×2 IMPLANT
STRIP CLOSURE SKIN 1/2X4 (GAUZE/BANDAGES/DRESSINGS) ×3 IMPLANT
SUCTION FRAZIER 12FR DISP (SUCTIONS) ×2 IMPLANT
SUT MNCRL AB 4-0 PS2 18 (SUTURE) ×2 IMPLANT
SUT VIC AB 2-0 CT1 27 (SUTURE) ×6
SUT VIC AB 2-0 CT1 TAPERPNT 27 (SUTURE) ×3 IMPLANT
SUT VLOC 180 0 24IN GS25 (SUTURE) ×2 IMPLANT
TOWEL OR 17X26 10 PK STRL BLUE (TOWEL DISPOSABLE) ×4 IMPLANT
TRAY FOLEY CATH 14FRSI W/METER (CATHETERS) ×2 IMPLANT
WATER STERILE IRR 1500ML POUR (IV SOLUTION) ×2 IMPLANT
WRAP KNEE MAXI GEL POST OP (GAUZE/BANDAGES/DRESSINGS) ×4 IMPLANT

## 2012-08-04 NOTE — Interval H&P Note (Signed)
History and Physical Interval Note:  08/04/2012 2:16 PM  Kiara Brown  has presented today for surgery, with the diagnosis of Osteoarthritis of the Left Knee  The various methods of treatment have been discussed with the patient and family. After consideration of risks, benefits and other options for treatment, the patient has consented to  Procedure(s) (LRB) with comments: TOTAL KNEE ARTHROPLASTY (Left) as a surgical intervention .  The patient's history has been reviewed, patient examined, no change in status, stable for surgery.  I have reviewed the patient's chart and labs.  Questions were answered to the patient's satisfaction.     Loanne Drilling

## 2012-08-04 NOTE — H&P (View-Only) (Signed)
Kiara Brown  DOB: 08/01/1941 Married / Language: English / Race: White Female  Date of Admission:  08/04/2012  Chief Complaint:  Left Knee Pain  History of Present Illness The patient is a 71 year old female who comes in for a preoperative History and Physical. The patient is scheduled for a left total knee arthroplasty to be performed by Dr. Frank V. Aluisio, MD at [facility] on 08/04/2012. The patient is a 70 year old female who presents with knee complaints. The patient is seen today for a second opinion. The patient reports left knee and right knee symptoms including: pain, instability, stiffness and grinding which began 1 year(s) ago without any known injury.The patient feels that the symptoms are worsening. The patient has the current diagnosis of knee osteoarthritis. Prior to being seen today the patient was previously evaluated by a colleague. Previous work-up for this problem has included knee x-rays and arthroscopy (on the left in 2002, and right in 2004). Past treatment for this problem has included intra-articular injection of corticosteroids (as well as Synvisc. The Synvisc didn't seem to help at all. Her last cortisone injection was in April.). Current treatment includes application of ice, nonsteroidal anti-inflammatory drugs (Celebrex) and non-opioid analgesics (Tylenol). [name] has recently been treated by Dr. Lennon in [place]. He did the injections. Unfortunately, her knee is at a stage where it is hurting her all the time. This is preventing her from doing things she desires. She has pain in it day and night. The left knee is far worse than the right but her right knee hurts also. The knees are limiting her activities. She was told by Dr. Lennon that she was too obese to have the knee replacement and she would be at high risk for infection and amputation. She is subsequently here today for a second opinion. She is ready to now proceed with  surgery. They have been treated conservatively in the past for the above stated problem and despite conservative measures, they continue to have progressive pain and severe functional limitations and dysfunction. They have failed non-operative management including home exercise, medications, and injections. It is felt that they would benefit from undergoing total joint replacement. Risks and benefits of the procedure have been discussed with the patient and they elect to proceed with surgery. There are no active contraindications to surgery such as ongoing infection or rapidly progressive neurological disease.  Problem List/Past Medical Osteoarthritis, Knee (715.96) Rheumatoid Arthritis Fibromyalgia Osteoarthritis Chronic Pain Diverticulosis Hypertension Peripheral Neuropathy Hyperlipidemia. Diet-controlled  Allergies No Known Drug Allergies. 05/22/2012   Family History Diabetes Mellitus. mother, sister and brother Heart Disease. mother, father, brother, grandfather mothers side and grandfather fathers side Heart disease in female family member before age 65 Congestive Heart Failure. mother and brother Cancer. mother, sister and brother Cerebrovascular Accident. mother, brother and grandfather fathers side Chronic Obstructive Lung Disease. brother Heart disease in female family member before age 55 Hypertension. mother and brother Osteoporosis. brother Sister 1. Living. age 66, Myasthenia Gravis   Social History Exercise. Exercises weekly; does other Drug/Alcohol Rehab (Currently). no Living situation. live with spouse Illicit drug use. no Alcohol use. never consumed alcohol Current work status. retired Children. 2 Tobacco use. never smoker Tobacco / smoke exposure. no Number of flights of stairs before winded. less than 1 Marital status. married Previously in rehab. no Pain Contract. no   Medication History Orencia (250MG For Solution,  Intravenous) Active. Gabapentin (100MG Capsule, Oral) Active. BuPROPion HCl ER (XL) (150MG Tablet   ER 24HR, Oral) Active. CeleBREX (200MG Capsule, Oral) Active. TraMADol HCl (50MG Tablet, Oral) Active. Atenolol (50MG Tablet, Oral) Active. Folic Acid (1MG Tablet, Oral) Active. Aspirin Childrens (81MG Tablet Chewable, Oral) Active. Vitamin ( Oral daily) Active.   Pregnancy / Birth History Pregnant. no   Past Surgical History Arthroscopy of Knee. bilateral Arthroscopy of Shoulder. right Breast Biopsy. left; Benign Gallbladder Surgery. laporoscopic Hysterectomy. Date: 1981. partial (non-cancerous) Other Surgery. Ulnar nerve release on right arm Rotator Cuff Repair. right Appendectomy. Date: 1981. Done at same time of Hysterectomy Cystoscopy. Benign Tumor Removal and Sling Placement   Review of Systems General:Present- Weight Gain. Not Present- Chills, Fever, Night Sweats, Appetite Loss, Fatigue, Feeling sick and Weight Loss. Skin:Not Present- Itching, Rash, Skin Color Changes, Ulcer, Psoriasis and Change in Hair or Nails. HEENT:Not Present- Sensitivity to light, Nose Bleed, Visual Loss, Decreased Hearing and Ringing in the Ears. Neck:Not Present- Swollen Glands and Neck Mass. Respiratory:Present- Shortness of breath. Not Present- Snoring, Chronic Cough and Bloody sputum. Cardiovascular:Present- Shortness of Breath and Swelling of Extremities. Not Present- Chest Pain, Leg Cramps and Palpitations. Gastrointestinal:Present- Heartburn. Not Present- Bloody Stool, Abdominal Pain, Vomiting, Nausea and Incontinence of Stool. Musculoskeletal:Present- Joint Stiffness, Joint Swelling, Joint Pain and Back Pain. Not Present- Muscle Weakness and Muscle Pain. Neurological:Present- Tingling, Numbness and Burning. Not Present- Tremor, Headaches and Dizziness. Psychiatric:Not Present- Anxiety, Depression and Memory Loss. Endocrine:Not Present- Cold Intolerance, Heat Intolerance,  Excessive hunger and Excessive Thirst.   Vitals Weight: 240 lb Height: 64 in Body Surface Area: 2.22 m Body Mass Index: 41.2 kg/m Pulse: 76 (Regular) Resp.: 12 (Unlabored) BP: 142/82 (Sitting, Right Arm, Standard)   Physical Exam The physical exam findings are as follows:   General Mental Status - Alert, cooperative and good historian. General Appearance- pleasant. Not in acute distress. Orientation- Oriented X3. Build & Nutrition- Well nourished and Well developed.   Head and Neck Head- normocephalic, atraumatic . Neck Global Assessment- supple. no bruit auscultated on the right and no bruit auscultated on the left.   Eye Pupil- Bilateral- Regular and Round. Motion- Bilateral- EOMI. Patient does wear contacts.  Chest and Lung Exam Auscultation: Breath sounds:- clear at anterior chest wall and - clear at posterior chest wall. Adventitious sounds:- No Adventitious sounds.   Cardiovascular Auscultation:Rhythm- Regular rate and rhythm. Heart Sounds- S1 WNL and S2 WNL. Murmurs & Other Heart Sounds:Auscultation of the heart reveals - No Murmurs.   Abdomen Inspection:Contour- Generalized moderate distention. Palpation/Percussion:Tenderness- Abdomen is non-tender to palpation. Rigidity (guarding)- Abdomen is soft. Auscultation:Auscultation of the abdomen reveals - Bowel sounds normal.   Female Genitourinary Not done, not pertinent to present illness  Musculoskeletal She is a very pleasant, well developed female who is alert and oriented. No apparent distress. Both knees show no effusion. She does have significant morbid obesity with a lot of adipose distribution about her knees. Range is about 5-120 on each side. There is marked crepitus on range of motion. She does not have any peripheral edema. There is no instability noted about the knees.  RADIOGRAPHS: AP and lateral of both knees show advanced endstage arthritis in  both, mainly affecting the medial and patellofemoral compartments with bone on bone changes. She is a little worse on the left than the right. She also has tibial subluxation.  Assessment & Plan Osteoarthritis, Knee (715.96) Impression: Left Knee  Note: Patient is for a left total knee repalcement by Dr. Aluisio.  Patient wants to look into rehab.  PCP - Dr. Kevin Burns - Patient has   been seen preoperatively and felt to be stable for surgery.  Signed electronically by DREW L PERKINS, PA-C  

## 2012-08-04 NOTE — Transfer of Care (Signed)
Immediate Anesthesia Transfer of Care Note  Patient: Kiara Brown  Procedure(s) Performed: Procedure(s) (LRB) with comments: TOTAL KNEE ARTHROPLASTY (Left)  Patient Location: PACU  Anesthesia Type: Regional and Spinal  Level of Consciousness: awake, alert , sedated and patient cooperative  Airway & Oxygen Therapy: Patient Spontanous Breathing and Patient connected to nasal cannula oxygen  Post-op Assessment: Report given to PACU RN and Post -op Vital signs reviewed and stable  Post vital signs: Reviewed and stable  Complications: No apparent anesthesia complications

## 2012-08-04 NOTE — Anesthesia Preprocedure Evaluation (Addendum)
Anesthesia Evaluation  Patient identified by MRN, date of birth, ID band Patient awake    Reviewed: Allergy & Precautions, H&P , NPO status , Patient's Chart, lab work & pertinent test results  History of Anesthesia Complications (+) AWARENESS UNDER ANESTHESIA  Airway Mallampati: III TM Distance: <3 FB Neck ROM: Limited    Dental No notable dental hx.    Pulmonary sleep apnea ,    + decreased breath sounds      Cardiovascular hypertension, Pt. on medications Rhythm:Regular Rate:Normal     Neuro/Psych negative neurological ROS  negative psych ROS   GI/Hepatic Neg liver ROS, hiatal hernia,   Endo/Other  Morbid obesity  Renal/GU negative Renal ROS  negative genitourinary   Musculoskeletal negative musculoskeletal ROS (+)   Abdominal   Peds negative pediatric ROS (+)  Hematology negative hematology ROS (+)   Anesthesia Other Findings   Reproductive/Obstetrics negative OB ROS                          Anesthesia Physical Anesthesia Plan  ASA: III  Anesthesia Plan: Spinal   Post-op Pain Management:    Induction:   Airway Management Planned: Simple Face Mask  Additional Equipment:   Intra-op Plan:   Post-operative Plan:   Informed Consent: I have reviewed the patients History and Physical, chart, labs and discussed the procedure including the risks, benefits and alternatives for the proposed anesthesia with the patient or authorized representative who has indicated his/her understanding and acceptance.   Dental advisory given  Plan Discussed with: CRNA and Surgeon  Anesthesia Plan Comments:        Anesthesia Quick Evaluation

## 2012-08-04 NOTE — Op Note (Signed)
Pre-operative diagnosis- Osteoarthritis  Left knee(s)  Post-operative diagnosis- Osteoarthritis Left knee(s)  Procedure-  Left  Total Knee Arthroplasty  Surgeon- Kiara Rankin. Caldwell Kronenberger, MD  Assistant- Leilani Able, PA-C   Anesthesia-  Spinal EBL-* No blood loss amount entered *  Drains Hemovac  Tourniquet time-  Total Tourniquet Time Documented: Thigh (Left) - 55 minutes   Complications- None  Condition-PACU - hemodynamically stable.   Brief Clinical Note  Kiara Brown is a 70 y.o. year old female with morbid obesity and end stage OA of her left knee with progressively worsening pain and dysfunction. She has constant pain, with activity and at rest and significant functional deficits with difficulties even with ADLs. She has had extensive non-op management including analgesics, injections of cortisone and viscosupplements, and home exercise program, but remains in significant pain with significant dysfunction. Radiographs show bone on bone arthritis medial and patellofemoral compartments with varus deformity. She presents now for left Total Knee Arthroplasty.    Procedure in detail---   The patient is brought into the operating room and positioned supine on the operating table. After successful administration of  Spinal,   a tourniquet is placed high on the  Left thigh(s) and the lower extremity is prepped and draped in the usual sterile fashion. Time out is performed by the operating team and then the  Left lower extremity is wrapped in Esmarch, knee flexed and the tourniquet inflated to 300 mmHg.       A midline incision is made with a ten blade through a very thick layer of subcutaneous tissue to the level of the extensor mechanism. Special retraction was necessary to access the extensor mechanism. A fresh blade is used to make a medial parapatellar arthrotomy. Soft tissue over the proximal medial tibia is subperiosteally elevated to the joint line with a knife and into the semimembranosus  bursa with a Cobb elevator. Soft tissue over the proximal lateral tibia is elevated with attention being paid to avoiding the patellar tendon on the tibial tubercle. The patella is everted, knee flexed 90 degrees and the ACL and PCL are removed. Findings are bone on bone medial and patellofemoral with large medial osteophytes.        The drill is used to create a starting hole in the distal femur and the canal is thoroughly irrigated with sterile saline to remove the fatty contents. The 5 degree Left  valgus alignment guide is placed into the femoral canal and the distal femoral cutting block is pinned to remove 10 mm off the distal femur. Resection is made with an oscillating saw.      The tibia is subluxed forward and the menisci are removed. The extramedullary alignment guide is placed referencing proximally at the medial aspect of the tibial tubercle and distally along the second metatarsal axis and tibial crest. The block is pinned to remove 2mm off the more deficient medial  side. Resection is made with an oscillating saw. Size 3 is the most appropriate size for the tibia and the proximal tibia is prepared with the modular drill and keel punch for that size. I then prepared for the MBT revision tray which I chose to utilize for better metaphyseal fixation given her morbid obesity.      The femoral sizing guide is placed and size 2.5 is most appropriate. Rotation is marked off the epicondylar axis and confirmed by creating a rectangular flexion gap at 90 degrees. The size 2.5 cutting block is pinned in this rotation and the  anterior, posterior and chamfer cuts are made with the oscillating saw. The intercondylar block is then placed and that cut is made.      Trial size 3 MBT revision tibial component, trial size 2.5 posterior stabilized femur and a 12.5  mm posterior stabilized rotating platform insert trial is placed. Full extension is achieved with excellent varus/valgus and anterior/posterior balance  throughout full range of motion. The patella is everted and thickness measured to be 22  mm. Free hand resection is taken to 12 mm, a 35 template is placed, lug holes are drilled, trial patella is placed, and it tracks normally. Osteophytes are removed off the posterior femur with the trial in place. All trials are removed and the cut bone surfaces prepared with pulsatile lavage. Cement is mixed and once ready for implantation, the size 3 MBT revision tibial implant, size  2.5 posterior stabilized femoral component, and the size 35 patella are cemented in place and the patella is held with the clamp. The trial insert is placed and the knee held in full extension. All extruded cement is removed and once the cement is hard the permanent 12.5 mm posterior stabilized rotating platform insert is placed into the tibial tray.      The wound is copiously irrigated with saline solution and the extensor mechanism closed over a hemovac drain with #1 PDS suture. The tourniquet is released for a total tourniquet time of 53  minutes. Flexion against gravity is 125 degrees, at which point the thigh and calf meet preventing further flexion, and the patella tracks normally. Subcutaneous tissue is closed with 2.0 vicryl and subcuticular with running 4.0 Monocryl. The catheter for the Marcaine pain pump is placed and the pump is initiated. The incision is cleaned and dried and steri-strips and a bulky sterile dressing are applied. The limb is placed into a knee immobilizer and the patient is awakened and transported to recovery in stable condition.      Please note that a surgical assistant was a medical necessity for this procedure in order to perform it in a safe and expeditious manner. Surgical assistant was necessary to retract the ligaments and vital neurovascular structures to prevent injury to them and also necessary for proper positioning of the limb to allow for anatomic placement of the prosthesis.   Kiara Rankin Lopaka Karge,  MD    08/04/2012, 3:53 PM

## 2012-08-04 NOTE — Anesthesia Postprocedure Evaluation (Signed)
  Anesthesia Post-op Note  Patient: Kiara Brown  Procedure(s) Performed: Procedure(s) (LRB): TOTAL KNEE ARTHROPLASTY (Left)  Patient Location: PACU  Anesthesia Type: Spinal  Level of Consciousness: awake and alert   Airway and Oxygen Therapy: Patient Spontanous Breathing  Post-op Pain: mild  Post-op Assessment: Post-op Vital signs reviewed, Patient's Cardiovascular Status Stable, Respiratory Function Stable, Patent Airway and No signs of Nausea or vomiting  Post-op Vital Signs: stable  Complications: No apparent anesthesia complications

## 2012-08-04 NOTE — Preoperative (Signed)
Beta Blockers   Reason not to administer Beta Blockers:Atenolol 50 mg at 0900 08-04-12

## 2012-08-04 NOTE — Anesthesia Procedure Notes (Signed)
Spinal  Patient location during procedure: OR Staffing Performed by: anesthesiologist  Preanesthetic Checklist Completed: patient identified, site marked, surgical consent, pre-op evaluation, timeout performed, IV checked, risks and benefits discussed and monitors and equipment checked Spinal Block Patient position: sitting Prep: Betadine Patient monitoring: heart rate, continuous pulse ox and blood pressure Injection technique: single-shot Needle Needle type: Quincke  Needle gauge: 22 G Needle length: 9 cm Additional Notes Expiration date of kit checked and confirmed. Patient tolerated procedure well, without complications.     

## 2012-08-04 NOTE — Plan of Care (Signed)
Problem: Consults Goal: Diagnosis- Total Joint Replacement Left total knee     

## 2012-08-05 ENCOUNTER — Encounter (HOSPITAL_COMMUNITY): Payer: Self-pay | Admitting: Orthopedic Surgery

## 2012-08-05 LAB — CBC
Hemoglobin: 11.3 g/dL — ABNORMAL LOW (ref 12.0–15.0)
MCV: 94.7 fL (ref 78.0–100.0)
Platelets: 262 10*3/uL (ref 150–400)
RBC: 3.56 MIL/uL — ABNORMAL LOW (ref 3.87–5.11)
WBC: 12.3 10*3/uL — ABNORMAL HIGH (ref 4.0–10.5)

## 2012-08-05 LAB — BASIC METABOLIC PANEL
CO2: 27 mEq/L (ref 19–32)
Chloride: 100 mEq/L (ref 96–112)
Creatinine, Ser: 0.88 mg/dL (ref 0.50–1.10)

## 2012-08-05 MED ORDER — MORPHINE SULFATE 2 MG/ML IJ SOLN: 1.0000 mg | INTRAMUSCULAR | Status: DC | PRN

## 2012-08-05 NOTE — Progress Notes (Signed)
Utilization review completed.  

## 2012-08-05 NOTE — Progress Notes (Signed)
Clinical Social Work Department CLINICAL SOCIAL WORK PLACEMENT NOTE 08/05/2012  Patient:  Kiara Brown, Kiara Brown  Account Number:  [id number] Admit date:  08/04/2012  Clinical Social Worker:  Cori Razor, LCSW  Date/time:  08/05/2012 02:45 PM  Clinical Social Work is seeking post-discharge placement for this patient at the following level of care:   SKILLED NURSING   (*CSW will update this form in Epic as items are completed)     Patient/family provided with [facility] Department of Clinical Social Work's list of facilities offering this level of care within the geographic area requested by the patient (or if unable, by the patient's family).    Patient/family informed of their freedom to choose among providers that offer the needed level of care, that participate in Medicare, Medicaid or managed care program needed by the patient, have an available bed and are willing to accept the patient.    Patient/family informed of [facility]' ownership interest in [facility], as well as of the fact that they are under no obligation to receive care at this facility.  PASARR submitted to EDS on 08/05/2012 PASARR number received from EDS on 08/05/2012  FL2 transmitted to all facilities in geographic area requested by pt/family on  08/05/2012 FL2 transmitted to all facilities within larger geographic area on   Patient informed that his/her managed care company has contracts with or will negotiate with  certain facilities, including the following:     Patient/family informed of bed offers received:  08/05/2012 Patient chooses bed at [facility] Physician recommends and patient chooses bed at    Patient to be transferred to [facility] on   Patient to be transferred to facility by   The following physician request were entered in Epic:   Additional Comments:  Cori Razor LCSW [phone]

## 2012-08-05 NOTE — Evaluation (Signed)
Physical Therapy Evaluation Patient Details Name: Kiara Brown MRN: [id number] DOB: 23-Nov-1941 Today's Date: 08/05/2012 Time: 4098-1191 PT Time Calculation (min): 35 min  PT Assessment / Plan / Recommendation Clinical Impression  Pt with L TKR presents with decreased L LE strength/ROM, increased pain and c/o dizziness with OOB limiting functional mobility    PT Assessment  Patient needs continued PT services    Follow Up Recommendations  Skilled nursing facility    Barriers to Discharge        Equipment Recommendations  Defer to next venue    Recommendations for Other Services OT consult   Frequency 7X/week    Precautions / Restrictions Precautions Precautions: Knee Required Braces or Orthoses: Knee Immobilizer - Left Knee Immobilizer - Left: Discontinue once straight leg raise with < 10 degree lag Restrictions Weight Bearing Restrictions: No Other Position/Activity Restrictions: WBAT   Pertinent Vitals/Pain 6/10 with activity; pt premedicated, ice packs provided; Pt with c/o dizziness - BP 150/83      Mobility  Bed Mobility Bed Mobility: Supine to Sit Supine to Sit: 3: Mod assist Details for Bed Mobility Assistance: cues for sequence and use of R LE to self assist Transfers Transfers: Sit to Stand;Stand to Sit Sit to Stand: 1: +2 Total assist;From elevated surface;From bed;With upper extremity assist Sit to Stand: Patient Percentage: 60% Stand to Sit: 1: +2 Total assist;With armrests;To chair/3-in-1 Stand to Sit: Patient Percentage: 70% Details for Transfer Assistance: cues for use of UEs and for LE management with assist Ambulation/Gait Ambulation/Gait Assistance: 1: +2 Total assist Ambulation/Gait: Patient Percentage: 70% Ambulation Distance (Feet): 4 Feet Assistive device: Rolling walker Ambulation/Gait Assistance Details: cues for sequence and posture - ltd by c/o increased dizziness Gait Pattern: Step-to pattern    Exercises Total Joint  Exercises Ankle Circles/Pumps: AROM;15 reps;Supine;Both Quad Sets: AROM;10 reps;Supine;Both Heel Slides: AAROM;10 reps;Supine;Left (multiple rests required to complete 10 reps) Straight Leg Raises: AAROM;10 reps;Supine;Left   PT Diagnosis: Difficulty walking  PT Problem List: Decreased strength;Decreased range of motion;Decreased activity tolerance;Decreased mobility;Decreased knowledge of use of DME;Pain;Obesity;Decreased knowledge of precautions PT Treatment Interventions: DME instruction;Gait training;Therapeutic activities;Functional mobility training;Stair training;Patient/family education;Therapeutic exercise   PT Goals Acute Rehab PT Goals PT Goal Formulation: With patient Time For Goal Achievement: 08/11/12 Potential to Achieve Goals: Good Pt will go Supine/Side to Sit: with supervision PT Goal: Supine/Side to Sit - Progress: Goal set today Pt will go Sit to Supine/Side: with supervision PT Goal: Sit to Supine/Side - Progress: Goal set today Pt will go Sit to Stand: with supervision PT Goal: Sit to Stand - Progress: Goal set today Pt will go Stand to Sit: with supervision PT Goal: Stand to Sit - Progress: Goal set today Pt will Ambulate: 51 - 150 feet;with supervision;with rolling walker PT Goal: Ambulate - Progress: Goal set today  Visit Information  Last PT Received On: 08/05/12 Assistance Needed: +2    Subjective Data  Subjective: I was sick all night Patient Stated Goal: Rehab and then home alone   Prior Functioning  Home Living Lives With: Alone Home Adaptive Equipment: Straight cane Prior Function Level of Independence: Independent with assistive device(s) Communication Communication: No difficulties    Cognition  Overall Cognitive Status: Appears within functional limits for tasks assessed/performed Arousal/Alertness: Awake/alert Orientation Level: Appears intact for tasks assessed Behavior During Session: [facility] for tasks performed    Extremity/Trunk  Assessment Right Upper Extremity Assessment RUE ROM/Strength/Tone: [facility] for tasks assessed Left Upper Extremity Assessment LUE ROM/Strength/Tone: [facility] for tasks assessed Right Lower Extremity  Assessment RLE ROM/Strength/Tone: [facility] for tasks assessed Left Lower Extremity Assessment LLE ROM/Strength/Tone: Deficits LLE ROM/Strength/Tone Deficits: 2/5 Quads, AAROM at knee -10 - 30 with ++ muscle guarding   Balance    End of Session PT - End of Session Equipment Utilized During Treatment: Left knee immobilizer Activity Tolerance: Other (comment) (dizzy) Patient left: in chair;with call bell/phone within reach;with family/visitor present Nurse Communication: Mobility status  GP     Kienna Moncada 08/05/2012, 9:37 AM

## 2012-08-05 NOTE — Progress Notes (Signed)
   Subjective: 1 Day Post-Op Procedure(s) (LRB): TOTAL KNEE ARTHROPLASTY (Left) Patient reports pain as mild.   Patient seen in rounds with Dr. Lequita Halt. Patient is well, but has had some minor complaints of nausea. We will start therapy today.  Plan is to go Home after hospital stay.  Objective: Vital signs in last 24 hours: Temp:  [95.9 F (35.5 C)-98.4 F (36.9 C)] 97.8 F (36.6 C) (09/10 0620) Pulse Rate:  [54-72] 67  (09/10 0620) Resp:  [11-18] 11  (09/10 0620) BP: (117-152)/(57-79) 130/72 mmHg (09/10 0620) SpO2:  [97 %-100 %] 99 % (09/10 0620) Weight:  [109.317 kg (241 lb)] 109.317 kg (241 lb) (09/09 1745)  Intake/Output from previous day:  Intake/Output Summary (Last 24 hours) at 08/05/12 0738 Last data filed at 08/05/12 0647  Gross per 24 hour  Intake   3475 ml  Output   1745 ml  Net   1730 ml    Intake/Output this shift:    Labs:  Basename 08/05/12 0410  HGB 11.3*    Basename 08/05/12 0410  WBC 12.3*  RBC 3.56*  HCT 33.7*  PLT 262    Basename 08/05/12 0410  NA 136  K 4.2  CL 100  CO2 27  BUN 17  CREATININE 0.88  GLUCOSE 156*  CALCIUM 8.7   No results found for this basename: LABPT:2,INR:2 in the last 72 hours  EXAM General - Patient is Alert, Appropriate and Oriented Extremity - Neurovascular intact Sensation intact distally Dorsiflexion/Plantar flexion intact Dressing - dressing C/D/I Motor Function - intact, moving foot and toes well on exam.  Hemovac pulled without difficulty.  Past Medical History  Diagnosis Date  . Diverticulosis   . Hyperlipidemia   . H/O hiatal hernia   . Arthritis     osteo, rheumatoid  . Sleep apnea     STOP BANG SCORE 5  . Hypertension     EKG, LOV with clearance Dr Lawerance Bach 7/13 CHART  . PONV (postoperative nausea and vomiting)     "THROAT WITH EXTREME BRUSING"  . Fibromyalgia     peripheral neuropathy  . Sciatica   . Cubital tunnel syndrome     Assessment/Plan: 1 Day Post-Op Procedure(s)  (LRB): TOTAL KNEE ARTHROPLASTY (Left) Principal Problem:  *OA (osteoarthritis) of knee   Advance diet Up with therapy Discharge home with home health  DVT Prophylaxis - Xarelto Weight-Bearing as tolerated to left leg No vaccines. D/C PCA Morphine, Change to IV push D/C O2 and Pulse OX and try on Room [address]  Patrica Duel 08/05/2012, 7:38 AM

## 2012-08-05 NOTE — Progress Notes (Signed)
Clinical Social Work Department BRIEF PSYCHOSOCIAL ASSESSMENT 08/05/2012  Patient:  Kiara Brown, Kiara Brown     Account Number:  [id number]     Admit date:  08/04/2012  Clinical Social Worker:  Candie Chroman  Date/Time:  08/05/2012 02:38 PM  Referred by:  Physician  Date Referred:  08/05/2012 Referred for  SNF Placement   Other Referral:   Interview type:  Patient Other interview type:    PSYCHOSOCIAL DATA Living Status:  HUSBAND Admitted from facility:   Level of care:   Primary support name:  Dellis Filbert Primary support relationship to patient:  SPOUSE Degree of support available:   unclear    CURRENT CONCERNS Current Concerns  Post-Acute Placement   Other Concerns:    SOCIAL WORK ASSESSMENT / PLAN Pt is a 71 yr old female living at home prior to hospitalization. CSW met with pt to assist with d/c planning. ST SNF placement is needed following hospital d/c. Pt has made arrangements to have rehab at [facility]. SNF contacted and d/c plan has been confirmed. CSW will follow to assist with d/c planning to SNF.   Assessment/plan status:  Psychosocial Support/Ongoing Assessment of Needs Other assessment/ plan:   Information/referral to community resources:   None needed at this time.    PATIENT'S/FAMILY'S RESPONSE TO PLAN OF CARE: Pt is looking forward to feeling better and having rehab at [facility].    Cori Razor LCSW [phone]

## 2012-08-06 DIAGNOSIS — E871 Hypo-osmolality and hyponatremia: Secondary | ICD-10-CM | POA: Diagnosis not present

## 2012-08-06 LAB — BASIC METABOLIC PANEL
BUN: 13 mg/dL (ref 6–23)
CO2: 29 mEq/L (ref 19–32)
Chloride: 94 mEq/L — ABNORMAL LOW (ref 96–112)
Creatinine, Ser: 0.89 mg/dL (ref 0.50–1.10)

## 2012-08-06 LAB — CBC
HCT: 32.7 % — ABNORMAL LOW (ref 36.0–46.0)
MCV: 93.2 fL (ref 78.0–100.0)
RBC: 3.51 MIL/uL — ABNORMAL LOW (ref 3.87–5.11)
RDW: 13.7 % (ref 11.5–15.5)
WBC: 14.4 10*3/uL — ABNORMAL HIGH (ref 4.0–10.5)

## 2012-08-06 MED ORDER — SODIUM CHLORIDE 0.9 % IV SOLN
Freq: Once | INTRAVENOUS | Status: AC
  Administered 2012-08-06: 500 mL/h via INTRAVENOUS

## 2012-08-06 MED ORDER — TRAMADOL HCL 50 MG PO TABS
50.0000 mg | ORAL_TABLET | Freq: Four times a day (QID) | ORAL | Status: DC | PRN
  Administered 2012-08-06: 50 mg via ORAL
  Administered 2012-08-07 (×2): 100 mg via ORAL
  Filled 2012-08-06 (×2): qty 2
  Filled 2012-08-06: qty 1

## 2012-08-06 NOTE — Progress Notes (Signed)
Physical Therapy Treatment Patient Details Name: Kiara Brown MRN: [id number] DOB: [birth year] Today's Date: 08/06/2012 Time: 6010-9323 PT Time Calculation (min): 39 min  PT Assessment / Plan / Recommendation Comments on Treatment Session  Pt with c/o increased dizziness with ambulation - returned to sitting, became briefly unresponsive.  RN assessing BP    Follow Up Recommendations  Skilled nursing facility    Barriers to Discharge        Equipment Recommendations  Defer to next venue    Recommendations for Other Services OT consult  Frequency 7X/week   Plan Discharge plan remains appropriate    Precautions / Restrictions Precautions Precautions: Knee Required Braces or Orthoses: Knee Immobilizer - Left Knee Immobilizer - Left: Discontinue once straight leg raise with < 10 degree lag Restrictions Weight Bearing Restrictions: No Other Position/Activity Restrictions: WBAT   Pertinent Vitals/Pain 5-6/10    Mobility  Bed Mobility Bed Mobility: Supine to Sit Supine to Sit: 3: Mod assist Details for Bed Mobility Assistance: cues for sequence and use of R LE to self assist Transfers Transfers: Sit to Stand;Stand to Sit Sit to Stand: 1: +2 Total assist;From bed;From elevated surface;With upper extremity assist Sit to Stand: Patient Percentage: 70% Stand to Sit: 1: +2 Total assist;With armrests;To chair/3-in-1 Stand to Sit: Patient Percentage: 50% Details for Transfer Assistance: cues for use of UEs and for LE management with assist Ambulation/Gait Ambulation/Gait Assistance: 1: +2 Total assist Ambulation/Gait: Patient Percentage: 70% Ambulation Distance (Feet): 20 Feet Assistive device: Rolling walker Ambulation/Gait Assistance Details: cues for posture, sequence, and position from RW Gait Pattern: Step-to pattern    Exercises Total Joint Exercises Ankle Circles/Pumps: AROM;20 reps;Supine;Both Quad Sets: AROM;20 reps;Both;Supine Heel Slides: AAROM;20  reps;Supine;Left (multiple rests required to complete full reps) Straight Leg Raises: AAROM;20 reps;Left;Supine   PT Diagnosis:    PT Problem List:   PT Treatment Interventions:     PT Goals Acute Rehab PT Goals PT Goal Formulation: With patient Time For Goal Achievement: 08/11/12 Potential to Achieve Goals: Good Pt will go Supine/Side to Sit: with supervision PT Goal: Supine/Side to Sit - Progress: Progressing toward goal Pt will go Sit to Supine/Side: with supervision PT Goal: Sit to Supine/Side - Progress: Progressing toward goal Pt will go Sit to Stand: with supervision PT Goal: Sit to Stand - Progress: Progressing toward goal Pt will go Stand to Sit: with supervision PT Goal: Stand to Sit - Progress: Progressing toward goal Pt will Ambulate: 51 - 150 feet;with supervision;with rolling walker PT Goal: Ambulate - Progress: Progressing toward goal  Visit Information  Last PT Received On: 08/06/12 Assistance Needed: +2    Subjective Data  Subjective: I think I need to sit down (after walking 20') Patient Stated Goal: Rehab and then home alone   Cognition  Overall Cognitive Status: Appears within functional limits for tasks assessed/performed Arousal/Alertness: Awake/alert Orientation Level: Appears intact for tasks assessed Behavior During Session: [facility] for tasks performed    Balance     End of Session PT - End of Session Activity Tolerance: Other (comment) Patient left: in chair;with call bell/phone within reach;with family/visitor present Nurse Communication: Mobility status   GP     Phoebe Marter 08/06/2012, 1:01 PM

## 2012-08-06 NOTE — Progress Notes (Signed)
Patient light-headed during PT. Patient states" weakness." BP 110/85. Avel Peace PA notified. New orders given.

## 2012-08-06 NOTE — Evaluation (Signed)
Occupational Therapy Evaluation Patient Details Name: Kiara Brown MRN: [id number] DOB: 1941/03/08 Today's Date: 08/06/2012 Time: 4098-1191 OT Time Calculation (min): 22 min  OT Assessment / Plan / Recommendation Clinical Impression  This 71 year old female was admitted for L TKA.  Pt currently needs A x 2 for mobility, LB ADLs for safety.  During eval, pt had brief episode of feeling dizzy and unresponsiveness--RN alerted.  Pt will benefit from continued OT to increase independence with adls with min to mod A level goals in acute    OT Assessment  Patient needs continued OT Services    Follow Up Recommendations  Skilled nursing facility    Barriers to Discharge      Equipment Recommendations  Defer to next venue    Recommendations for Other Services    Frequency  Min 2X/week    Precautions / Restrictions Precautions Precautions: Knee Required Braces or Orthoses: Knee Immobilizer - Left Knee Immobilizer - Left: Discontinue once straight leg raise with < 10 degree lag Restrictions Weight Bearing Restrictions: No Other Position/Activity Restrictions: WBAT   Pertinent Vitals/Pain No c/o pain.  Dynamap BP 132/85--RN retook manually with lower number; HR 90 sats 95% on RA    ADL  Eating/Feeding: Simulated;Independent Where Assessed - Eating/Feeding: Chair Grooming: Performed;Wash/dry face;Set up Where Assessed - Grooming: Supported sitting Upper Body Bathing: Simulated;Set up Where Assessed - Upper Body Bathing: Supported sitting Lower Body Bathing: +2 Total assistance Lower Body Bathing: Patient Percentage: 30% Where Assessed - Lower Body Bathing: Supported sit to stand Upper Body Dressing: Simulated;Set up Where Assessed - Upper Body Dressing: Supported sitting Lower Body Dressing: Simulated;+2 Total assistance Lower Body Dressing: Patient Percentage: 10% Where Assessed - Lower Body Dressing: Supported sit to stand Toilet Transfer: Simulated;+2 Total  assistance Toilet Transfer: Patient Percentage: 70% Statistician Method: Sit to Barista:  (bed, chair after walking to door) Therapist, nutritional and Hygiene: Simulated;+2 Total assistance Toileting - Architect and Hygiene: Patient Percentage: 30% Where Assessed - Glass blower/designer Manipulation and Hygiene: Sit to stand from 3-in-1 or toilet Transfers/Ambulation Related to ADLs: While walking just past door, pt stated she needed to sit and was momentarily unresponsive:  Vitals taken and RN alerted:  RN retook BP and called MD ADL Comments: Educated on use of AE.  Pt did not try any today--felt weak after episode    OT Diagnosis: Generalized weakness  OT Problem List: Decreased strength;Decreased activity tolerance;Decreased knowledge of use of DME or AE;Cardiopulmonary status limiting activity;Pain OT Treatment Interventions: Self-care/ADL training;DME and/or AE instruction;Patient/family education;Therapeutic activities   OT Goals Acute Rehab OT Goals OT Goal Formulation: With patient Time For Goal Achievement: 08/13/12 Potential to Achieve Goals: Good ADL Goals Pt Will Perform Grooming: with min assist;Standing at sink;Supported (min guard) ADL Goal: Grooming - Progress: Goal set today Pt Will Perform Lower Body Bathing: Sit to stand from chair;with adaptive equipment;with min assist ADL Goal: Lower Body Bathing - Progress: Goal set today Pt Will Perform Lower Body Dressing: with mod assist;Sit to stand from chair;with adaptive equipment ADL Goal: Lower Body Dressing - Progress: Goal set today Pt Will Transfer to Toilet: with min assist;Ambulation;3-in-1 ADL Goal: Toilet Transfer - Progress: Goal set today Pt Will Perform Toileting - Hygiene: with min assist;Sit to stand from 3-in-1/toilet (min guard) ADL Goal: Toileting - Hygiene - Progress: Goal set today  Visit Information  Last OT Received On: 08/06/12 Assistance Needed:  +2 PT/OT Co-Evaluation/Treatment: Yes    Subjective Data  Subjective: where do i get that (AE) Patient Stated Goal: rehab then home   Prior Functioning  Vision/Perception  Home Living Lives With: Alone Prior Function Level of Independence: Independent with assistive device(s) Communication Communication: No difficulties      Cognition  Overall Cognitive Status: Appears within functional limits for tasks assessed/performed Arousal/Alertness: Awake/alert Orientation Level: Appears intact for tasks assessed Behavior During Session: [facility] for tasks performed    Extremity/Trunk Assessment Right Upper Extremity Assessment RUE ROM/Strength/Tone: [facility] for tasks assessed Left Upper Extremity Assessment LUE ROM/Strength/Tone: WFL for tasks assessed   Mobility  Shoulder Instructions  Bed Mobility Bed Mobility: Supine to Sit Supine to Sit: 3: Mod assist Details for Bed Mobility Assistance: cues for sequence and use of R LE to self assist Transfers Sit to Stand: 1: +2 Total assist;From bed;From elevated surface;With upper extremity assist Sit to Stand: Patient Percentage: 70% Stand to Sit: 1: +2 Total assist;With armrests;To chair/3-in-1 Stand to Sit: Patient Percentage: 50% Details for Transfer Assistance: cues for use of UEs and for LE management with assist       Exercise     Balance     End of Session OT - End of Session Activity Tolerance: Treatment limited secondary to medical complications (Comment) Patient left: in chair;with call bell/phone within reach;with family/visitor present Nurse Communication: Other (comment) (pt briefly passed out)  GO     Kiara Brown 08/06/2012, 12:57 PM Kiara Brown, OTR/L [phone] 08/06/2012

## 2012-08-06 NOTE — Progress Notes (Signed)
Patient became dizzy while transfering from [facility] back to bed. Took blood pressure once back in bed was 91/58. Placed patient in trendelenburg and retook blood pressure after ten minutes. BP 109/68. Will continue to monitor.

## 2012-08-06 NOTE — Progress Notes (Signed)
Physical Therapy Treatment Patient Details Name: Kiara Brown MRN: [id number] DOB: 24-Jan-1941 Today's Date: 08/06/2012 Time: 7829-5621 PT Time Calculation (min): 19 min  PT Assessment / Plan / Recommendation Comments on Treatment Session  Pt with c/o increased dizziness with ambulation - RN aware    Follow Up Recommendations  Skilled nursing facility    Barriers to Discharge        Equipment Recommendations  Defer to next venue    Recommendations for Other Services OT consult  Frequency 7X/week   Plan Discharge plan remains appropriate    Precautions / Restrictions Precautions Precautions: Knee Required Braces or Orthoses: Knee Immobilizer - Left Knee Immobilizer - Left: Discontinue once straight leg raise with < 10 degree lag Restrictions Weight Bearing Restrictions: No Other Position/Activity Restrictions: WBAT   Pertinent Vitals/Pain 5/10    Mobility  Bed Mobility Bed Mobility: Sit to Supine Sit to Supine: 3: Mod assist Details for Bed Mobility Assistance: cues for sequence and use of R LE to self assist Transfers Transfers: Sit to Stand;Stand to Sit Sit to Stand: 4: Min assist;3: Mod assist Stand to Sit: 1: +2 Total assist;To bed;With upper extremity assist Stand to Sit: Patient Percentage: 70% Details for Transfer Assistance: cues for use of UEs and for LE management with assist Ambulation/Gait Ambulation/Gait Assistance: 1: +2 Total assist Ambulation/Gait: Patient Percentage: 70% Ambulation Distance (Feet): 20 Feet Assistive device: Rolling walker Ambulation/Gait Assistance Details: cues for posture, sequence, and position from RW Gait Pattern: Step-to pattern    Exercises     PT Diagnosis:    PT Problem List:   PT Treatment Interventions:     PT Goals Acute Rehab PT Goals PT Goal Formulation: With patient Time For Goal Achievement: 08/11/12 Potential to Achieve Goals: Good Pt will go Supine/Side to Sit: with supervision PT Goal: Supine/Side to  Sit - Progress: Progressing toward goal Pt will go Sit to Supine/Side: with supervision PT Goal: Sit to Supine/Side - Progress: Progressing toward goal Pt will go Sit to Stand: with supervision PT Goal: Sit to Stand - Progress: Progressing toward goal Pt will go Stand to Sit: with supervision PT Goal: Stand to Sit - Progress: Progressing toward goal Pt will Ambulate: 51 - 150 feet;with supervision;with rolling walker PT Goal: Ambulate - Progress: Progressing toward goal  Visit Information  Last PT Received On: 08/06/12 Assistance Needed: +2    Subjective Data  Subjective: I think I need to sit down (after walking 20') Patient Stated Goal: Rehab and then home alone   Cognition  Overall Cognitive Status: Appears within functional limits for tasks assessed/performed Arousal/Alertness: Awake/alert Orientation Level: Appears intact for tasks assessed Behavior During Session: [facility] for tasks performed    Balance     End of Session PT - End of Session Equipment Utilized During Treatment: Left knee immobilizer Activity Tolerance: Other (comment) Patient left: with call bell/phone within reach;in bed Nurse Communication: Mobility status   GP     Kiara Brown 08/06/2012, 4:30 PM

## 2012-08-06 NOTE — Care Management Note (Signed)
    Page 1 of 2   08/06/2012     5:36:28 PM   CARE MANAGEMENT NOTE 08/06/2012  Patient:  Kiara Brown, Kiara Brown   Account Number:  [id number]  Date Initiated:  08/06/2012  Documentation initiated by:  Colleen Can  Subjective/Objective Assessment:   dx osteoarthritis left knee; total knee replacemnt     Action/Plan:   SNF rehab   Anticipated DC Date:  08/07/2012   Anticipated DC Plan:  SKILLED NURSING FACILITY  In-house referral  Clinical Social Worker      DC Planning Services  NA      [facility] Choice  NA   Choice offered to / List presented to:  NA   DME arranged  NA      DME agency  NA     HH arranged  NA      HH agency  NA   Status of service:  Completed, signed off Medicare Important Message given?  NA - LOS <3 / Initial given by admissions (If response is "NO", the following Medicare IM given date fields will be blank) Date Medicare IM given:   Date Additional Medicare IM given:    Discharge Disposition:    Per UR Regulation:    If discussed at Long Length of Stay Meetings, dates discussed:    Comments:

## 2012-08-06 NOTE — Progress Notes (Signed)
   Subjective: 2 Days Post-Op Procedure(s) (LRB): TOTAL KNEE ARTHROPLASTY (Left) Patient reports pain as mild.   Patient seen in rounds for Dr. Lequita Halt. Patient is well, and has had no acute complaints or problems Plan is to go Skilled nursing facility after hospital stay.  Objective: Vital signs in last 24 hours: Temp:  [97.9 F (36.6 C)-99.3 F (37.4 C)] 99.3 F (37.4 C) (09/11 0502) Pulse Rate:  [70-104] 99  (09/11 0530) Resp:  [12-16] 14  (09/11 0502) BP: (91-141)/(58-79) 109/68 mmHg (09/11 0530) SpO2:  [93 %-99 %] 93 % (09/11 0502)  Intake/Output from previous day:  Intake/Output Summary (Last 24 hours) at 08/06/12 0802 Last data filed at 08/05/12 2328  Gross per 24 hour  Intake    240 ml  Output   1400 ml  Net  -1160 ml    Intake/Output this shift:    Labs:  Basename 08/06/12 0418 08/05/12 0410  HGB 10.7* 11.3*    Basename 08/06/12 0418 08/05/12 0410  WBC 14.4* 12.3*  RBC 3.51* 3.56*  HCT 32.7* 33.7*  PLT 260 262    Basename 08/06/12 0418 08/05/12 0410  NA 129* 136  K 4.0 4.2  CL 94* 100  CO2 29 27  BUN 13 17  CREATININE 0.89 0.88  GLUCOSE 141* 156*  CALCIUM 8.7 8.7   No results found for this basename: LABPT:2,INR:2 in the last 72 hours  EXAM General - Patient is Alert, Appropriate and Oriented Extremity - Neurovascular intact Sensation intact distally Dorsiflexion/Plantar flexion intact No cellulitis present Dressing/Incision - clean, dry, no drainage, healing Motor Function - intact, moving foot and toes well on exam.   Past Medical History  Diagnosis Date  . Diverticulosis   . Hyperlipidemia   . H/O hiatal hernia   . Arthritis     osteo, rheumatoid  . Sleep apnea     STOP BANG SCORE 5  . Hypertension     EKG, LOV with clearance Dr Lawerance Bach 7/13 CHART  . PONV (postoperative nausea and vomiting)     "THROAT WITH EXTREME BRUSING"  . Fibromyalgia     peripheral neuropathy  . Sciatica   . Cubital tunnel syndrome      Assessment/Plan: 2 Days Post-Op Procedure(s) (LRB): TOTAL KNEE ARTHROPLASTY (Left) Principal Problem:  *OA (osteoarthritis) of knee Active Problems:  Postop Hyponatremia Recheck BMET in AM  Up with therapy Plan for discharge tomorrow Discharge to SNF - Looking into [facility]  DVT Prophylaxis - Xarelto Weight-Bearing as tolerated to left leg  PERKINS, ALEXZANDREW 08/06/2012, 8:02 AM

## 2012-08-07 DIAGNOSIS — E876 Hypokalemia: Secondary | ICD-10-CM | POA: Diagnosis not present

## 2012-08-07 LAB — CBC
HCT: 28.9 % — ABNORMAL LOW (ref 36.0–46.0)
MCH: 31.5 pg (ref 26.0–34.0)
MCHC: 33.9 g/dL (ref 30.0–36.0)
MCV: 92.9 fL (ref 78.0–100.0)
RDW: 13.9 % (ref 11.5–15.5)

## 2012-08-07 LAB — BASIC METABOLIC PANEL
BUN: 13 mg/dL (ref 6–23)
Creatinine, Ser: 0.79 mg/dL (ref 0.50–1.10)
GFR calc Af Amer: >90 mL/min (ref >90)
GFR calc non Af Amer: 82 mL/min — ABNORMAL LOW (ref >90)

## 2012-08-07 MED ORDER — BISACODYL 10 MG RE SUPP: 10.0000 mg | Freq: Every day | RECTAL | Status: AC | PRN

## 2012-08-07 MED ORDER — OXYCODONE HCL 5 MG PO TABS: 5.0000 mg | ORAL_TABLET | ORAL | Status: AC | PRN

## 2012-08-07 MED ORDER — METHOCARBAMOL 500 MG PO TABS: 500.0000 mg | ORAL_TABLET | Freq: Four times a day (QID) | ORAL | Status: AC | PRN

## 2012-08-07 MED ORDER — RIVAROXABAN 10 MG PO TABS: 10.0000 mg | ORAL_TABLET | Freq: Every day | ORAL | Status: DC

## 2012-08-07 MED ORDER — DSS 100 MG PO CAPS: 100.0000 mg | ORAL_CAPSULE | Freq: Two times a day (BID) | ORAL | Status: AC

## 2012-08-07 MED ORDER — POTASSIUM CHLORIDE CRYS ER 20 MEQ PO TBCR
40.0000 meq | EXTENDED_RELEASE_TABLET | ORAL | Status: DC
  Filled 2012-08-07 (×2): qty 2

## 2012-08-07 MED ORDER — POLYSACCHARIDE IRON COMPLEX 150 MG PO CAPS: 150.0000 mg | ORAL_CAPSULE | Freq: Every day | ORAL | Status: DC

## 2012-08-07 MED ORDER — POLYSACCHARIDE IRON COMPLEX 150 MG PO CAPS
150.0000 mg | ORAL_CAPSULE | Freq: Every day | ORAL | Status: DC
  Filled 2012-08-07: qty 1

## 2012-08-07 MED ORDER — POLYETHYLENE GLYCOL 3350 17 G PO PACK: 17.0000 g | PACK | Freq: Every day | ORAL | Status: AC | PRN

## 2012-08-07 NOTE — Progress Notes (Signed)
   Subjective: 3 Days Post-Op Procedure(s) (LRB): TOTAL KNEE ARTHROPLASTY (Left) Patient reports pain as mild.   Patient seen in rounds with Dr. Lequita Halt. Patient is well, and has had no acute complaints or problems Patient is ready to go [facility] today.  Objective: Vital signs in last 24 hours: Temp:  [98.8 F (37.1 C)-100.1 F (37.8 C)] 98.8 F (37.1 C) (09/12 0618) Pulse Rate:  [88-105] 95  (09/12 0618) Resp:  [14-16] 14  (09/12 0618) BP: (110-145)/(66-85) 145/67 mmHg (09/12 0618) SpO2:  [92 %-94 %] 94 % (09/12 0618)  Intake/Output from previous day:  Intake/Output Summary (Last 24 hours) at 08/07/12 0754 Last data filed at 08/07/12 0618  Gross per 24 hour  Intake    660 ml  Output   2525 ml  Net  -1865 ml    Intake/Output this shift:    Labs:  Basename 08/07/12 0418 08/06/12 0418 08/05/12 0410  HGB 9.8* 10.7* 11.3*    Basename 08/07/12 0418 08/06/12 0418  WBC 14.2* 14.4*  RBC 3.11* 3.51*  HCT 28.9* 32.7*  PLT 241 260    Basename 08/07/12 0418 08/06/12 0418  NA 132* 129*  K 3.4* 4.0  CL 97 94*  CO2 24 29  BUN 13 13  CREATININE 0.79 0.89  GLUCOSE 134* 141*  CALCIUM 8.5 8.7   No results found for this basename: LABPT:2,INR:2 in the last 72 hours  EXAM: General - Patient is Alert, Appropriate and Oriented Extremity - Neurovascular intact Sensation intact distally Dorsiflexion/Plantar flexion intact No cellulitis present Incision - clean, dry, no drainage, healing Motor Function - intact, moving foot and toes well on exam.   Assessment/Plan: 3 Days Post-Op Procedure(s) (LRB): TOTAL KNEE ARTHROPLASTY (Left) Procedure(s) (LRB): TOTAL KNEE ARTHROPLASTY (Left) Past Medical History  Diagnosis Date  . Diverticulosis   . Hyperlipidemia   . H/O hiatal hernia   . Arthritis     osteo, rheumatoid  . Sleep apnea     STOP BANG SCORE 5  . Hypertension     EKG, LOV with clearance Dr Lawerance Bach 7/13 CHART  . PONV (postoperative nausea and vomiting)    "THROAT WITH EXTREME BRUSING"  . Fibromyalgia     peripheral neuropathy  . Sciatica   . Cubital tunnel syndrome    Principal Problem:  *OA (osteoarthritis) of knee Active Problems:  Postop Hyponatremia  Postop Hypokalemia   Discharge to SNF Diet - Cardiac diet Follow up - in 2 weeks Activity - WBAT Disposition - Skilled nursing facility Condition Upon Discharge - Good D/C Meds - See DC Summary DVT Prophylaxis - Xarelto  PERKINS, ALEXZANDREW 08/07/2012, 7:54 AM

## 2012-08-07 NOTE — Discharge Summary (Signed)
Physician Discharge Summary   Patient ID: Kiara Brown MRN: [id number] DOB/AGE: 1940/12/05 71 y.o.  Admit date: 08/04/2012 Discharge date: 08/07/2012  Primary Diagnosis: Osteoarthritis Left knee   Admission Diagnoses:  Past Medical History  Diagnosis Date  . Diverticulosis   . Hyperlipidemia   . H/O hiatal hernia   . Arthritis     osteo, rheumatoid  . Sleep apnea     STOP BANG SCORE 5  . Hypertension     EKG, LOV with clearance Dr Lawerance Bach 7/13 CHART  . PONV (postoperative nausea and vomiting)     "THROAT WITH EXTREME BRUSING"  . Fibromyalgia     peripheral neuropathy  . Sciatica   . Cubital tunnel syndrome    Discharge Diagnoses:   Principal Problem:  *OA (osteoarthritis) of knee Active Problems:  Postop Hyponatremia  Postop Hypokalemia  Procedure:  Procedure(s) (LRB): TOTAL KNEE ARTHROPLASTY (Left)   Consults: None  HPI: Kiara Brown is a 71 y.o. year old female with morbid obesity and end stage OA of her left knee with progressively worsening pain and dysfunction. She has constant pain, with activity and at rest and significant functional deficits with difficulties even with ADLs. She has had extensive non-op management including analgesics, injections of cortisone and viscosupplements, and home exercise program, but remains in significant pain with significant dysfunction. Radiographs show bone on bone arthritis medial and patellofemoral compartments with varus deformity. She presents now for left Total Knee Arthroplasty.       Laboratory Data: Hospital Outpatient Visit on 07/29/2012  Component Date Value Range Status  . aPTT 07/29/2012 36  24 - 37 seconds Final  . WBC 07/29/2012 6.5  4.0 - 10.5 K/uL Final  . RBC 07/29/2012 4.08  3.87 - 5.11 MIL/uL Final  . Hemoglobin 07/29/2012 12.8  12.0 - 15.0 g/dL Final  . HCT 40/98/1191 38.2  36.0 - 46.0 % Final  . MCV 07/29/2012 93.6  78.0 - 100.0 fL Final  . MCH 07/29/2012 31.4  26.0 - 34.0 pg Final  . MCHC  07/29/2012 33.5  30.0 - 36.0 g/dL Final  . RDW 47/82/9562 13.5  11.5 - 15.5 % Final  . Platelets 07/29/2012 326  150 - 400 K/uL Final  . Sodium 07/29/2012 138  135 - 145 mEq/L Final  . Potassium 07/29/2012 3.7  3.5 - 5.1 mEq/L Final  . Chloride 07/29/2012 101  96 - 112 mEq/L Final  . CO2 07/29/2012 26  19 - 32 mEq/L Final  . Glucose, Bld 07/29/2012 129* 70 - 99 mg/dL Final  . BUN 13/06/6577 13  6 - 23 mg/dL Final  . Creatinine, Ser 07/29/2012 0.91  0.50 - 1.10 mg/dL Final  . Calcium 46/96/2952 9.1  8.4 - 10.5 mg/dL Final  . Total Protein 07/29/2012 7.1  6.0 - 8.3 g/dL Final  . Albumin 84/13/2440 3.7  3.5 - 5.2 g/dL Final  . AST 09/22/2535 37  0 - 37 U/L Final  . ALT 07/29/2012 34  0 - 35 U/L Final  . Alkaline Phosphatase 07/29/2012 89  39 - 117 U/L Final  . Total Bilirubin 07/29/2012 0.3  0.3 - 1.2 mg/dL Final  . GFR calc non Af Amer 07/29/2012 62* >90 mL/min Final  . GFR calc Af Amer 07/29/2012 72* >90 mL/min Final   Comment:                                 The eGFR has  been calculated                          using the CKD EPI equation.                          This calculation has not been                          validated in all clinical                          situations.                          eGFR's persistently                          <90 mL/min signify                          possible Chronic Kidney Disease.  Marland Kitchen Prothrombin Time 07/29/2012 13.2  11.6 - 15.2 seconds Final  . INR 07/29/2012 0.98  0.00 - 1.49 Final  . Color, Urine 07/29/2012 YELLOW  YELLOW Final  . APPearance 07/29/2012 CLEAR  CLEAR Final  . Specific Gravity, Urine 07/29/2012 1.011  1.005 - 1.030 Final  . pH 07/29/2012 6.0  5.0 - 8.0 Final  . Glucose, UA 07/29/2012 NEGATIVE  NEGATIVE mg/dL Final  . Hgb urine dipstick 07/29/2012 SMALL* NEGATIVE Final  . Bilirubin Urine 07/29/2012 NEGATIVE  NEGATIVE Final  . Ketones, ur 07/29/2012 NEGATIVE  NEGATIVE mg/dL Final  . Protein, ur 16/08/9603 NEGATIVE   NEGATIVE mg/dL Final  . Urobilinogen, UA 07/29/2012 0.2  0.0 - 1.0 mg/dL Final  . Nitrite 54/07/8118 NEGATIVE  NEGATIVE Final  . Leukocytes, UA 07/29/2012 SMALL* NEGATIVE Final  . MRSA, PCR 07/29/2012 NEGATIVE  NEGATIVE Final  . Staphylococcus aureus 07/29/2012 NEGATIVE  NEGATIVE Final   Comment:                                 The Xpert SA Assay (FDA                          approved for NASAL specimens                          in patients over 87 years of age),                          is one component of                          a comprehensive surveillance                          program.  Test performance has                          been validated by [employer]  for patients greater                          than or equal to 4 year old.                          It is not intended                          to diagnose infection nor to                          guide or monitor treatment.  . ABO/RH(D) 07/29/2012 A NEG   Final  . Antibody Screen 07/29/2012 POS   Final  . Sample Expiration 07/29/2012 08/03/2012   Final  . Antibody Identification 07/29/2012 ANTI-D   Final  . DAT, IgG 07/29/2012 NEG   Final  . Squamous Epithelial / LPF 07/29/2012 RARE  RARE Final  . WBC, UA 07/29/2012 0-2  <3 WBC/hpf Final  . RBC / HPF 07/29/2012 0-2  <3 RBC/hpf Final  . Bacteria, UA 07/29/2012 RARE  RARE Final    Basename 08/07/12 0418 08/06/12 0418 08/05/12 0410  HGB 9.8* 10.7* 11.3*    Basename 08/07/12 0418 08/06/12 0418  WBC 14.2* 14.4*  RBC 3.11* 3.51*  HCT 28.9* 32.7*  PLT 241 260    Basename 08/07/12 0418 08/06/12 0418  NA 132* 129*  K 3.4* 4.0  CL 97 94*  CO2 24 29  BUN 13 13  CREATININE 0.79 0.89  GLUCOSE 134* 141*  CALCIUM 8.5 8.7   No results found for this basename: LABPT:2,INR:2 in the last 72 hours  X-Rays:Dg Chest 2 View  07/29/2012  *RADIOLOGY REPORT*  Clinical Data: Preop for knee arthroscopy.  CHEST - 2 VIEW  Comparison: None  Findings:  The cardiac silhouette, mediastinal and hilar contours are within normal limits.  The lungs are clear.  No pleural effusion.  The bony thorax is intact.  IMPRESSION: No acute cardiopulmonary findings.   Original Report Authenticated By: P. Loralie Champagne, M.D.     EKG:No orders found for this or any previous visit.   Hospital Course: Patient was admitted to [facility] and taken to the OR and underwent the above state procedure without complications.  Patient tolerated the procedure well and was later transferred to the recovery room and then to the orthopaedic floor for postoperative care.  They were given PO and IV analgesics for pain control following their surgery.  They were given 24 hours of postoperative antibiotics and started on DVT prophylaxis in the form of Xarelto.   PT and OT were ordered for total joint protocol.  Discharge planning consulted to help with postop disposition and equipment needs.  Patient had a good night on the evening of surgery and started to get up OOB with therapy on day one.  PCA Morphine was discontinued and they were weaned over to PO meds.  Hemovac drain was pulled without difficulty.  Continued to work with therapy into day two.  Dressing was changed on day two and the incision was healing well.  By day three, the patient had progressed with therapy and meeting their goals.  Incision was healing well.  Patient was seen in rounds and was ready to go home.  Discharge Medications: Prior to Admission medications   Medication Sig Start Date End Date Taking? Authorizing  Provider  acetaminophen (TYLENOL) 500 MG tablet Take 500 mg by mouth every 6 (six) hours as needed. Pain   Yes Historical Provider, MD  atenolol (TENORMIN) 50 MG tablet Take 50 mg by mouth daily before breakfast.   Yes Historical Provider, MD  buPROPion (WELLBUTRIN XL) 150 MG 24 hr tablet Take 150 mg by mouth daily before breakfast.   Yes Historical Provider, MD  celecoxib (CELEBREX) 200 MG  capsule Take 200 mg by mouth daily.   Yes Historical Provider, MD  gabapentin (NEURONTIN) 100 MG capsule Take 100 mg by mouth at bedtime.   Yes Historical Provider, MD  traMADol (ULTRAM) 50 MG tablet Take 50 mg by mouth every 6 (six) hours as needed.   Yes Historical Provider, MD  bisacodyl (DULCOLAX) 10 MG suppository Place 1 suppository (10 mg total) rectally daily as needed. 08/07/12 08/17/12  Keean Wilmeth, PA  docusate sodium 100 MG CAPS Take 100 mg by mouth 2 (two) times daily. 08/07/12 08/17/12  Sahas Sluka, PA  iron polysaccharides (NIFEREX) 150 MG capsule Take 1 capsule (150 mg total) by mouth daily. 08/07/12 08/07/13  Shamanda Len, PA  methocarbamol (ROBAXIN) 500 MG tablet Take 1 tablet (500 mg total) by mouth every 6 (six) hours as needed. 08/07/12 08/17/12  Rion Catala, PA  oxyCODONE (OXY IR/ROXICODONE) 5 MG immediate release tablet Take 1-2 tablets (5-10 mg total) by mouth every 4 (four) hours as needed for pain. 08/07/12 08/17/12  Mckale Haffey, PA  polyethylene glycol (MIRALAX / GLYCOLAX) packet Take 17 g by mouth daily as needed. 08/07/12 08/10/12  Harjit Leider Julien Girt, PA  rivaroxaban (XARELTO) 10 MG TABS tablet Take 1 tablet (10 mg total) by mouth daily with breakfast. Take Xarelto for two and a half more weeks, then discontinue Xarelto. Once the patient has completed the Xarelto, they may resume the 81 mg Aspirin. 08/07/12   Kalynne Womac Julien Girt, PA    Diet: Cardiac diet Activity:WBAT Follow-up:in 2 weeks Disposition - Skilled nursing facility - [facility] Discharged Condition: good   Discharge Orders    Future Orders Please Complete By Expires   Diet - low sodium heart healthy      Diet Carb Modified      Call MD / Call 911      Comments:   If you experience chest pain or shortness of breath, CALL 911 and be transported to the hospital emergency room.  If you develope a fever above 101 F, pus (white drainage) or increased drainage or redness at the  wound, or calf pain, call your surgeon's office.   Discharge instructions      Comments:   Pick up stool softner and laxative for home. Do not submerge incision under water. May shower. Continue to use ice for pain and swelling from surgery.  Take Xarelto for two and a half more weeks, then discontinue Xarelto. Once the patient has completed the Xarelto, they may resume the 81 mg Aspirin.  When discharged from the skilled rehab facility, please have the facility set up the patient's Home Health Physical Therapy prior to being released.  Also provide the patient with their medications at time of release from the facility to include their pain medication, the muscle relaxants, and their blood thinner medication.  If the patient is still at the rehab facility at time of follow up appointment, please also assist the patient in arranging follow up appointment in our office and any transportation needs.   Increase activity slowly as tolerated      Patient  may shower      Comments:   You may shower without a dressing once there is no drainage.  Do not wash over the wound.  If drainage remains, do not shower until drainage stops.   Driving restrictions      Comments:   No driving until released by the physician.   Lifting restrictions      Comments:   No lifting until released by the physician.   TED hose      Comments:   Use stockings (TED hose) for 3 weeks on both leg(s).  You may remove them at night for sleeping.   Change dressing      Comments:   Change dressing daily with sterile 4 x 4 inch gauze dressing and apply TED hose. Do not submerge the incision under water.   Do not put a pillow under the knee. Place it under the heel.      Do not sit on low chairs, stoools or toilet seats, as it may be difficult to get up from low surfaces      Constipation Prevention      Comments:   Drink plenty of fluids.  Prune juice may be helpful.  You may use a stool softener, such as Colace (over the  counter) 100 mg twice a day.  Use MiraLax (over the counter) for constipation as needed.       Medication List     As of 08/07/2012  8:03 AM    STOP taking these medications         aspirin EC 81 MG tablet      folic acid 1 MG tablet   Commonly known as: FOLVITE      multivitamin with minerals Tabs      sodium chloride 0.9 % SOLN with abatacept 250 MG SOLR      TAKE these medications         acetaminophen 500 MG tablet   Commonly known as: TYLENOL   Take 500 mg by mouth every 6 (six) hours as needed. Pain      atenolol 50 MG tablet   Commonly known as: TENORMIN   Take 50 mg by mouth daily before breakfast.      bisacodyl 10 MG suppository   Commonly known as: DULCOLAX   Place 1 suppository (10 mg total) rectally daily as needed.      buPROPion 150 MG 24 hr tablet   Commonly known as: WELLBUTRIN XL   Take 150 mg by mouth daily before breakfast.      celecoxib 200 MG capsule   Commonly known as: CELEBREX   Take 200 mg by mouth daily.      DSS 100 MG Caps   Take 100 mg by mouth 2 (two) times daily.      gabapentin 100 MG capsule   Commonly known as: NEURONTIN   Take 100 mg by mouth at bedtime.      iron polysaccharides 150 MG capsule   Commonly known as: NIFEREX   Take 1 capsule (150 mg total) by mouth daily.      methocarbamol 500 MG tablet   Commonly known as: ROBAXIN   Take 1 tablet (500 mg total) by mouth every 6 (six) hours as needed.      oxyCODONE 5 MG immediate release tablet   Commonly known as: Oxy IR/ROXICODONE   Take 1-2 tablets (5-10 mg total) by mouth every 4 (four) hours as needed for pain.  polyethylene glycol packet   Commonly known as: MIRALAX / GLYCOLAX   Take 17 g by mouth daily as needed.      rivaroxaban 10 MG Tabs tablet   Commonly known as: XARELTO   Take 1 tablet (10 mg total) by mouth daily with breakfast. Take Xarelto for two and a half more weeks, then discontinue Xarelto.  Once the patient has completed the Xarelto,  they may resume the 81 mg Aspirin.      traMADol 50 MG tablet   Commonly known as: ULTRAM   Take 50 mg by mouth every 6 (six) hours as needed.           Follow-up Information    Follow up with Loanne Drilling, MD. Schedule an appointment as soon as possible for a visit in 2 weeks.   Contact information:   [facility] [address], [address] [place] [address] [phone]          Signed: Patrica Duel 08/07/2012, 8:03 AM

## 2012-08-07 NOTE — Progress Notes (Signed)
Physical Therapy Treatment Patient Details Name: Kiara Brown MRN: [id number] DOB: 04-22-1941 Today's Date: 08/07/2012 Time: 7829-5621 PT Time Calculation (min): 20 min  PT Assessment / Plan / Recommendation Comments on Treatment Session  OOB deferred at pt request - transfer to SNF pending    Follow Up Recommendations  Skilled nursing facility    Barriers to Discharge        Equipment Recommendations  Defer to next venue    Recommendations for Other Services OT consult  Frequency 7X/week   Plan Discharge plan remains appropriate    Precautions / Restrictions Precautions Precautions: Knee Required Braces or Orthoses: Knee Immobilizer - Left Knee Immobilizer - Left: Discontinue once straight leg raise with < 10 degree lag Restrictions Weight Bearing Restrictions: No Other Position/Activity Restrictions: WBAT   Pertinent Vitals/Pain 5/10; premedicated    Mobility       Exercises Total Joint Exercises Ankle Circles/Pumps: AROM;20 reps;Supine;Both Quad Sets: AROM;20 reps;Both;Supine Heel Slides: AAROM;20 reps;Supine;Left Straight Leg Raises: AAROM;20 reps;Left;Supine   PT Diagnosis:    PT Problem List:   PT Treatment Interventions:     PT Goals Acute Rehab PT Goals PT Goal Formulation: With patient Time For Goal Achievement: 08/11/12  Visit Information  Last PT Received On: 08/07/12 Assistance Needed: +1    Subjective Data  Patient Stated Goal: Rehab and then home alone   Cognition  Overall Cognitive Status: Appears within functional limits for tasks assessed/performed Arousal/Alertness: Awake/alert Orientation Level: Appears intact for tasks assessed Behavior During Session: [facility] for tasks performed    Balance     End of Session PT - End of Session Activity Tolerance: Patient tolerated treatment well Patient left: with call bell/phone within reach;in bed   GP     Braylea Brancato 08/07/2012, 1:01 PM

## 2012-08-07 NOTE — Progress Notes (Signed)
Clinical Social Work Department CLINICAL SOCIAL WORK PLACEMENT NOTE 08/07/2012  Patient:  Kiara Brown, Kiara Brown  Account Number:  [id number] Admit date:  08/04/2012  Clinical Social Worker:  Cori Razor, LCSW  Date/time:  08/05/2012 02:45 PM  Clinical Social Work is seeking post-discharge placement for this patient at the following level of care:   SKILLED NURSING   (*CSW will update this form in Epic as items are completed)     Patient/family provided with [facility] Department of Clinical Social Work's list of facilities offering this level of care within the geographic area requested by the patient (or if unable, by the patient's family).    Patient/family informed of their freedom to choose among providers that offer the needed level of care, that participate in Medicare, Medicaid or managed care program needed by the patient, have an available bed and are willing to accept the patient.    Patient/family informed of [facility]' ownership interest in [facility], as well as of the fact that they are under no obligation to receive care at this facility.  PASARR submitted to [employer] on 08/05/2012 PASARR number received from [employer] on 08/05/2012  FL2 transmitted to all facilities in geographic area requested by pt/family on  08/05/2012 FL2 transmitted to all facilities within larger geographic area on   Patient informed that his/her managed care company has contracts with or will negotiate with  certain facilities, including the following:     Patient/family informed of bed offers received:  08/05/2012 Patient chooses bed at [facility] Physician recommends and patient chooses bed at    Patient to be transferred to [facility] on  08/07/2012 Patient to be transferred to facility by [facility]  The following physician request were entered in Epic:   Additional Comments:  Cori Razor LCSW [phone]

## 2012-08-08 LAB — TYPE AND SCREEN
ABO/RH(D): A NEG
DAT, IgG: NEGATIVE
Unit division: 0

## 2012-09-01 ENCOUNTER — Ambulatory Visit: Payer: Medicare Other | Attending: Orthopedic Surgery | Admitting: Physical Therapy

## 2012-09-01 DIAGNOSIS — IMO0001 Reserved for inherently not codable concepts without codable children: Secondary | ICD-10-CM | POA: Insufficient documentation

## 2012-09-01 DIAGNOSIS — R262 Difficulty in walking, not elsewhere classified: Secondary | ICD-10-CM | POA: Insufficient documentation

## 2012-09-01 DIAGNOSIS — Z96659 Presence of unspecified artificial knee joint: Secondary | ICD-10-CM | POA: Insufficient documentation

## 2012-09-01 DIAGNOSIS — M25669 Stiffness of unspecified knee, not elsewhere classified: Secondary | ICD-10-CM | POA: Insufficient documentation

## 2012-09-01 DIAGNOSIS — M25569 Pain in unspecified knee: Secondary | ICD-10-CM | POA: Insufficient documentation

## 2012-09-04 ENCOUNTER — Ambulatory Visit: Payer: Medicare Other | Admitting: Physical Therapy

## 2012-09-09 ENCOUNTER — Ambulatory Visit: Payer: Medicare Other | Admitting: Physical Therapy

## 2012-09-11 ENCOUNTER — Ambulatory Visit: Payer: Medicare Other | Admitting: Physical Therapy

## 2012-09-16 ENCOUNTER — Ambulatory Visit: Payer: Medicare Other | Admitting: Physical Therapy

## 2012-09-18 ENCOUNTER — Ambulatory Visit: Payer: Medicare Other | Admitting: Physical Therapy

## 2012-09-25 ENCOUNTER — Encounter: Payer: BLUE CROSS/BLUE SHIELD | Admitting: Physical Therapy

## 2012-10-02 ENCOUNTER — Ambulatory Visit: Payer: Medicare Other | Admitting: Physical Therapy

## 2012-10-06 ENCOUNTER — Ambulatory Visit: Payer: Medicare Other | Attending: Orthopedic Surgery | Admitting: Physical Therapy

## 2012-10-06 DIAGNOSIS — M25569 Pain in unspecified knee: Secondary | ICD-10-CM | POA: Insufficient documentation

## 2012-10-06 DIAGNOSIS — M25669 Stiffness of unspecified knee, not elsewhere classified: Secondary | ICD-10-CM | POA: Insufficient documentation

## 2012-10-06 DIAGNOSIS — IMO0001 Reserved for inherently not codable concepts without codable children: Secondary | ICD-10-CM | POA: Insufficient documentation

## 2012-10-06 DIAGNOSIS — R262 Difficulty in walking, not elsewhere classified: Secondary | ICD-10-CM | POA: Insufficient documentation

## 2012-10-06 DIAGNOSIS — Z96659 Presence of unspecified artificial knee joint: Secondary | ICD-10-CM | POA: Insufficient documentation

## 2013-06-03 ENCOUNTER — Other Ambulatory Visit: Payer: Self-pay | Admitting: Obstetrics & Gynecology

## 2013-06-03 DIAGNOSIS — Z1239 Encounter for other screening for malignant neoplasm of breast: Secondary | ICD-10-CM

## 2013-06-03 DIAGNOSIS — Z803 Family history of malignant neoplasm of breast: Secondary | ICD-10-CM

## 2013-07-20 ENCOUNTER — Other Ambulatory Visit: Payer: BLUE CROSS/BLUE SHIELD

## 2013-07-20 ENCOUNTER — Ambulatory Visit
Admission: RE | Admit: 2013-07-20 | Discharge: 2013-07-20 | Disposition: A | Discharge status: Home / Self Care | Payer: Medicare Other | Source: Ambulatory Visit | Attending: Obstetrics & Gynecology | Admitting: Obstetrics & Gynecology

## 2013-07-20 DIAGNOSIS — Z803 Family history of malignant neoplasm of breast: Secondary | ICD-10-CM

## 2013-07-20 DIAGNOSIS — Z1239 Encounter for other screening for malignant neoplasm of breast: Secondary | ICD-10-CM

## 2013-07-20 MED ORDER — GADOBENATE DIMEGLUMINE 529 MG/ML IV SOLN
20.0000 mL | Freq: Once | INTRAVENOUS | Status: AC | PRN
  Administered 2013-07-20: 20 mL via INTRAVENOUS

## 2013-07-22 ENCOUNTER — Other Ambulatory Visit: Payer: BLUE CROSS/BLUE SHIELD

## 2014-02-18 ENCOUNTER — Other Ambulatory Visit: Payer: Self-pay | Admitting: Orthopedic Surgery

## 2014-03-04 ENCOUNTER — Encounter (HOSPITAL_COMMUNITY): Payer: Self-pay | Admitting: Pharmacy Technician

## 2014-03-04 NOTE — Patient Instructions (Addendum)
20 Kiara Brown  03/04/2014   Your procedure is scheduled on: WEDNESDAY APRIL 22ND, 2015  Report to [place] at 1015 AM.  Call this number if you have problems the morning of surgery [phone]   Remember:  Do not eat food or drink liquids :After Midnight.     Take these medicines the morning of surgery with A SIP OF WATER: ATENOLOL, WELLBUTRIN                               You may not have any metal on your body including hair pins and piercings  Do not wear jewelry, make-up, lotions, powders, or deodorant.   Men may shave face and neck.  Do not bring valuables to the hospital. [place].  Contacts, dentures or bridgework may not be worn into surgery.  Leave suitcase in the car. After surgery it may be brought to your room.  For patients admitted to the hospital, checkout time is 11:00 AM the day of discharge.   Patients discharged the day of surgery will not be allowed to drive home.  Name and phone number of your driver: gerald spouse cell [phone]  Special Instructions: N/A  [facility] - Preparing for Surgery Before surgery, you can play an important role.  Because skin is not sterile, your skin needs to be as free of germs as possible.  You can reduce the number of germs on your skin by washing with CHG (chlorahexidine gluconate) soap before surgery.  CHG is an antiseptic cleaner which kills germs and bonds with the skin to continue killing germs even after washing. Please DO NOT use if you have an allergy to CHG or antibacterial soaps.  If your skin becomes reddened/irritated stop using the CHG and inform your nurse when you arrive at Short Stay. Do not shave (including legs and underarms) for at least 48 hours prior to the first CHG shower.  You may shave your face. Please follow these instructions carefully:  1.  Shower with CHG Soap the night before surgery and the  morning of Surgery.  2.  If you choose to wash your  hair, wash your hair first as usual with your  normal  shampoo.  3.  After you shampoo, rinse your hair and body thoroughly to remove the  shampoo.                           4.  Use CHG as you would any other liquid soap.  You can apply chg directly  to the skin and wash                       Gently with a scrungie or clean washcloth.  5.  Apply the CHG Soap to your body ONLY FROM THE NECK DOWN.   Do not use on open                           Wound or open sores. Avoid contact with eyes, ears mouth and genitals (private parts).                        Genitals (private parts) with your normal soap.             6.  Wash  thoroughly, paying special attention to the area where your surgery  will be performed.  7.  Thoroughly rinse your body with warm water from the neck down.  8.  DO NOT shower/wash with your normal soap after using and rinsing off  the CHG Soap.                9.  Pat yourself dry with a clean towel.            10.  Wear clean pajamas.            11.  Place clean sheets on your bed the night of your first shower and do not  sleep with pets. Day of Surgery : Do not apply any lotions/deodorants the morning of surgery.  Please wear clean clothes to the hospital/surgery center.  FAILURE TO FOLLOW THESE INSTRUCTIONS MAY RESULT IN THE CANCELLATION OF YOUR SURGERY PATIENT SIGNATURE_________________________________  NURSE SIGNATURE__________________________________  Incentive Spirometer  An incentive spirometer is a tool that can help keep your lungs clear and active. This tool measures how well you are filling your lungs with each breath. Taking long deep breaths may help reverse or decrease the chance of developing breathing (pulmonary) problems (especially infection) following:  A long period of time when you are unable to move or be active. BEFORE THE PROCEDURE   If the spirometer includes an indicator to show your best effort, your nurse or respiratory therapist will set it to a  desired goal.  If possible, sit up straight or lean slightly forward. Try not to slouch.  Hold the incentive spirometer in an upright position. INSTRUCTIONS FOR USE  1. Sit on the edge of your bed if possible, or sit up as far as you can in bed or on a chair. 2. Hold the incentive spirometer in an upright position. 3. Breathe out normally. 4. Place the mouthpiece in your mouth and seal your lips tightly around it. 5. Breathe in slowly and as deeply as possible, raising the piston or the ball toward the top of the column. 6. Hold your breath for 3-5 seconds or for as long as possible. Allow the piston or ball to fall to the bottom of the column. 7. Remove the mouthpiece from your mouth and breathe out normally. 8. Rest for a few seconds and repeat Steps 1 through 7 at least 10 times every 1-2 hours when you are awake. Take your time and take a few normal breaths between deep breaths. 9. The spirometer may include an indicator to show your best effort. Use the indicator as a goal to work toward during each repetition. 10. After each set of 10 deep breaths, practice coughing to be sure your lungs are clear. If you have an incision (the cut made at the time of surgery), support your incision when coughing by placing a pillow or rolled up towels firmly against it. Once you are able to get out of bed, walk around indoors and cough well. You may stop using the incentive spirometer when instructed by your caregiver.  RISKS AND COMPLICATIONS  Take your time so you do not get dizzy or light-headed.  If you are in pain, you may need to take or ask for pain medication before doing incentive spirometry. It is harder to take a deep breath if you are having pain. AFTER USE  Rest and breathe slowly and easily.  It can be helpful to keep track of a log of your progress. Your caregiver can provide you  with a simple table to help with this. If you are using the spirometer at home, follow these  instructions: [place] IF:   You are having difficultly using the spirometer.  You have trouble using the spirometer as often as instructed.  Your pain medication is not giving enough relief while using the spirometer.  You develop fever of 100.5 F (38.1 C) or higher. SEEK IMMEDIATE MEDICAL CARE IF:   You cough up bloody sputum that had not been present before.  You develop fever of 102 F (38.9 C) or greater.  You develop worsening pain at or near the incision site. MAKE SURE YOU:   Understand these instructions.  Will watch your condition.  Will get help right away if you are not doing well or get worse. Document Released: 03/25/2007 Document Revised: 02/04/2012 Document Reviewed: 05/26/2007 [facility] Patient Information 2014 [place], [place].

## 2014-03-04 NOTE — Progress Notes (Signed)
EKG 10-13-13 [facility] CHEST 2 VIEW XRAY 10-13-13 [facility]

## 2014-03-05 ENCOUNTER — Encounter (HOSPITAL_COMMUNITY)
Admission: RE | Admit: 2014-03-05 | Discharge: 2014-03-05 | Disposition: A | Discharge status: Home / Self Care | Payer: Medicare Other | Source: Ambulatory Visit | Attending: Orthopedic Surgery | Admitting: Orthopedic Surgery

## 2014-03-05 ENCOUNTER — Encounter (HOSPITAL_COMMUNITY): Payer: Self-pay

## 2014-03-05 DIAGNOSIS — Z01812 Encounter for preprocedural laboratory examination: Secondary | ICD-10-CM | POA: Insufficient documentation

## 2014-03-05 HISTORY — DX: Gastro-esophageal reflux disease without esophagitis: K21.9

## 2014-03-05 HISTORY — DX: Cardiac arrhythmia, unspecified: I49.9

## 2014-03-05 LAB — BASIC METABOLIC PANEL
BUN: 16 mg/dL (ref 6–23)
CALCIUM: 9.1 mg/dL (ref 8.4–10.5)
CO2: 25 mEq/L (ref 19–32)
CREATININE: 0.89 mg/dL (ref 0.50–1.10)
Chloride: 102 mEq/L (ref 96–112)
GFR calc non Af Amer: 63 mL/min — ABNORMAL LOW (ref >90)
GFR, EST AFRICAN AMERICAN: 73 mL/min — AB (ref >90)
Glucose, Bld: 124 mg/dL — ABNORMAL HIGH (ref 70–99)
Potassium: 4 mEq/L (ref 3.7–5.3)
Sodium: 139 mEq/L (ref 137–147)

## 2014-03-05 LAB — CBC
HCT: 37.4 % (ref 36.0–46.0)
Hemoglobin: 12.5 g/dL (ref 12.0–15.0)
MCH: 29.9 pg (ref 26.0–34.0)
MCHC: 33.4 g/dL (ref 30.0–36.0)
MCV: 89.5 fL (ref 78.0–100.0)
Platelets: 269 10*3/uL (ref 150–400)
RBC: 4.18 MIL/uL (ref 3.87–5.11)
RDW: 14.4 % (ref 11.5–15.5)
WBC: 8.1 10*3/uL (ref 4.0–10.5)

## 2014-03-16 DIAGNOSIS — M659 Synovitis and tenosynovitis, unspecified: Secondary | ICD-10-CM | POA: Diagnosis present

## 2014-03-16 NOTE — H&P (Signed)
CC- Kiara Brown is a 73 y.o. female who presents with left knee pain.  HPI- . Knee Pain: Patient presents with knee pain involving the  left knee. Onset of the symptoms was several months ago. 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 She had an attempt at PT which was not successful. She presents for arthroscopy and synovectomy. Current symptoms include crepitus sensation and popping sensation. Pain is aggravated by going up and down stairs and rising after sitting.    Past Medical History  Diagnosis Date  . Diverticulosis   . Hyperlipidemia   . H/O hiatal hernia   . Dysrhythmia     hx rapid heartbeat, no cardiologist  . GERD (gastroesophageal reflux disease)   . Fibromyalgia     peripheral neuropathy  . Sciatica   . Fibromyalgia   . PONV (postoperative nausea and vomiting)     "THROAT WITH EXTREME [id number]"1324, ulnar nerve surgery  . Arthritis     osteo, rheumatoid    Past Surgical History  Procedure Laterality Date  . Rotator cuff repair      right  . Appendectomy    . Breast surgery      biopsy  . Cystoscopy with biopsy    . Abdominal hysterectomy    . Knee arthroscopy      bilateral  . Cardiac catheterization  2009    "no blockage per pt"  . Cholecystectomy    . Incontinence surgery    . Total knee arthroplasty  08/04/2012    Procedure: TOTAL KNEE ARTHROPLASTY;  Surgeon: Gearlean Alf, MD;  Location: [facility];  Service: Orthopedics;  Laterality: Left;  . Ulnar nerve surgery Right 1986    Prior to Admission medications   Medication Sig Start Date End Date Taking? Authorizing Provider  Abatacept (ORENCIA IV) Inject 1,000 mg into the vein every 30 (thirty) days. Monthly at Dr. Prudencio Pair office. Last does 02/12/14    Historical Provider, MD  acetaminophen (TYLENOL) 500 MG tablet Take 500 mg by mouth every 6 (six) hours as needed (Pain). Pain    Historical  Provider, MD  aspirin EC 81 MG tablet Take 81 mg by mouth every evening.    Historical Provider, MD  atenolol (TENORMIN) 50 MG tablet Take 50 mg by mouth daily before breakfast.    Historical Provider, MD  buPROPion (WELLBUTRIN XL) 150 MG 24 hr tablet Take 150 mg by mouth daily before breakfast.    Historical Provider, MD  folic acid (FOLVITE) 1 MG tablet Take 1 mg by mouth daily.    Historical Provider, MD  traMADol (ULTRAM) 50 MG tablet Take 50 mg by mouth every 6 (six) hours as needed (Pain).    Historical Provider, MD   KNEE EXAM antalgic gait, no effusion, marked crepitus on range of motion  Physical Examination: General appearance - alert, well appearing, and in no distress Mental status - alert, oriented to person, place, and time Chest - clear to auscultation, no wheezes, rales or rhonchi, symmetric air entry Heart - normal rate, regular rhythm, normal S1, S2, no murmurs, rubs, clicks or gallops Abdomen - soft, nontender, nondistended, no masses or organomegaly Neurological - alert, oriented, normal speech, no focal findings or movement disorder noted   Asessment/Plan--- Left knee hypertrophic synovitis- - Plan left knee arthroscopy with synovectomy. Procedure risks and potential comps discussed with patient who elects to proceed. Goals are decreased pain  and increased function with a high likelihood of achieving both

## 2014-03-17 ENCOUNTER — Encounter (HOSPITAL_COMMUNITY)
Admission: RE | Disposition: A | Discharge status: Home / Self Care | Payer: Self-pay | Source: Ambulatory Visit | Attending: Orthopedic Surgery

## 2014-03-17 ENCOUNTER — Encounter (HOSPITAL_COMMUNITY): Payer: Self-pay

## 2014-03-17 ENCOUNTER — Ambulatory Visit (HOSPITAL_COMMUNITY): Payer: Medicare Other | Admitting: Anesthesiology

## 2014-03-17 ENCOUNTER — Encounter (HOSPITAL_COMMUNITY): Payer: Medicare Other | Admitting: Anesthesiology

## 2014-03-17 ENCOUNTER — Ambulatory Visit (HOSPITAL_COMMUNITY)
Admission: RE | Admit: 2014-03-17 | Discharge: 2014-03-17 | Disposition: A | Discharge status: Home / Self Care | Payer: Medicare Other | Source: Ambulatory Visit | Attending: Orthopedic Surgery | Admitting: Orthopedic Surgery

## 2014-03-17 DIAGNOSIS — Z96659 Presence of unspecified artificial knee joint: Secondary | ICD-10-CM | POA: Insufficient documentation

## 2014-03-17 DIAGNOSIS — G609 Hereditary and idiopathic neuropathy, unspecified: Secondary | ICD-10-CM | POA: Insufficient documentation

## 2014-03-17 DIAGNOSIS — Z79899 Other long term (current) drug therapy: Secondary | ICD-10-CM | POA: Insufficient documentation

## 2014-03-17 DIAGNOSIS — M1990 Unspecified osteoarthritis, unspecified site: Secondary | ICD-10-CM | POA: Insufficient documentation

## 2014-03-17 DIAGNOSIS — M659 Synovitis and tenosynovitis, unspecified: Secondary | ICD-10-CM | POA: Diagnosis present

## 2014-03-17 DIAGNOSIS — IMO0001 Reserved for inherently not codable concepts without codable children: Secondary | ICD-10-CM | POA: Insufficient documentation

## 2014-03-17 DIAGNOSIS — I10 Essential (primary) hypertension: Secondary | ICD-10-CM | POA: Insufficient documentation

## 2014-03-17 DIAGNOSIS — Z7982 Long term (current) use of aspirin: Secondary | ICD-10-CM | POA: Insufficient documentation

## 2014-03-17 DIAGNOSIS — M069 Rheumatoid arthritis, unspecified: Secondary | ICD-10-CM | POA: Insufficient documentation

## 2014-03-17 DIAGNOSIS — E785 Hyperlipidemia, unspecified: Secondary | ICD-10-CM | POA: Insufficient documentation

## 2014-03-17 DIAGNOSIS — K219 Gastro-esophageal reflux disease without esophagitis: Secondary | ICD-10-CM | POA: Insufficient documentation

## 2014-03-17 DIAGNOSIS — G4730 Sleep apnea, unspecified: Secondary | ICD-10-CM | POA: Insufficient documentation

## 2014-03-17 DIAGNOSIS — M6789 Other specified disorders of synovium and tendon, multiple sites: Secondary | ICD-10-CM | POA: Insufficient documentation

## 2014-03-17 HISTORY — PX: KNEE ARTHROSCOPY: SHX127

## 2014-03-17 HISTORY — DX: Urinary tract infection, site not specified: N39.0

## 2014-03-17 SURGERY — ARTHROSCOPY, KNEE
Anesthesia: General | Site: Knee | Laterality: Left

## 2014-03-17 MED ORDER — ACETAMINOPHEN 10 MG/ML IV SOLN
1000.0000 mg | Freq: Once | INTRAVENOUS | Status: AC
  Administered 2014-03-17: 1000 mg via INTRAVENOUS
  Filled 2014-03-17: qty 100

## 2014-03-17 MED ORDER — EPHEDRINE SULFATE 50 MG/ML IJ SOLN
INTRAMUSCULAR | Status: DC | PRN
  Administered 2014-03-17: 10 mg via INTRAVENOUS
  Administered 2014-03-17: 15 mg via INTRAVENOUS

## 2014-03-17 MED ORDER — HYDROCODONE-ACETAMINOPHEN 5-325 MG PO TABS: 1.0000 | ORAL_TABLET | Freq: Four times a day (QID) | ORAL | Status: DC | PRN

## 2014-03-17 MED ORDER — ATENOLOL 50 MG PO TABS
50.0000 mg | ORAL_TABLET | Freq: Once | ORAL | Status: AC
  Administered 2014-03-17: 50 mg via ORAL
  Filled 2014-03-17: qty 1

## 2014-03-17 MED ORDER — KETAMINE HCL 10 MG/ML IJ SOLN
INTRAMUSCULAR | Status: AC
  Filled 2014-03-17: qty 1

## 2014-03-17 MED ORDER — LIDOCAINE HCL (CARDIAC) 20 MG/ML IV SOLN
INTRAVENOUS | Status: AC
  Filled 2014-03-17: qty 5

## 2014-03-17 MED ORDER — SODIUM CHLORIDE 0.9 % IV SOLN: INTRAVENOUS | Status: DC

## 2014-03-17 MED ORDER — CHLORHEXIDINE GLUCONATE 4 % EX LIQD: 60.0000 mL | Freq: Once | CUTANEOUS | Status: DC

## 2014-03-17 MED ORDER — DEXAMETHASONE SODIUM PHOSPHATE 10 MG/ML IJ SOLN
10.0000 mg | Freq: Once | INTRAMUSCULAR | Status: AC
  Administered 2014-03-17: 10 mg via INTRAVENOUS

## 2014-03-17 MED ORDER — PROPOFOL 10 MG/ML IV BOLUS
INTRAVENOUS | Status: DC | PRN
  Administered 2014-03-17: 20 mg via INTRAVENOUS
  Administered 2014-03-17: 200 mg via INTRAVENOUS
  Administered 2014-03-17: 20 mg via INTRAVENOUS

## 2014-03-17 MED ORDER — DEXAMETHASONE SODIUM PHOSPHATE 10 MG/ML IJ SOLN
INTRAMUSCULAR | Status: AC
  Filled 2014-03-17: qty 1

## 2014-03-17 MED ORDER — FENTANYL CITRATE 0.05 MG/ML IJ SOLN
INTRAMUSCULAR | Status: AC
  Filled 2014-03-17: qty 2

## 2014-03-17 MED ORDER — HYDROMORPHONE HCL PF 1 MG/ML IJ SOLN
0.2500 mg | INTRAMUSCULAR | Status: DC | PRN
  Administered 2014-03-17 (×2): 0.25 mg via INTRAVENOUS
  Administered 2014-03-17: 0.5 mg via INTRAVENOUS

## 2014-03-17 MED ORDER — PROPOFOL 10 MG/ML IV BOLUS
INTRAVENOUS | Status: AC
  Filled 2014-03-17: qty 20

## 2014-03-17 MED ORDER — BUPIVACAINE HCL 0.25 % IJ SOLN
INTRAMUSCULAR | Status: DC | PRN
  Administered 2014-03-17: 20 mL

## 2014-03-17 MED ORDER — CEFAZOLIN SODIUM-DEXTROSE 2-3 GM-% IV SOLR
INTRAVENOUS | Status: AC
  Filled 2014-03-17: qty 50

## 2014-03-17 MED ORDER — ONDANSETRON HCL 4 MG/2ML IJ SOLN
INTRAMUSCULAR | Status: AC
  Filled 2014-03-17: qty 2

## 2014-03-17 MED ORDER — BUPIVACAINE HCL (PF) 0.25 % IJ SOLN
INTRAMUSCULAR | Status: AC
  Filled 2014-03-17: qty 30

## 2014-03-17 MED ORDER — CEFAZOLIN SODIUM-DEXTROSE 2-3 GM-% IV SOLR
2.0000 g | INTRAVENOUS | Status: AC
  Administered 2014-03-17: 2 g via INTRAVENOUS

## 2014-03-17 MED ORDER — OXYCODONE HCL 5 MG PO TABS
5.0000 mg | ORAL_TABLET | Freq: Once | ORAL | Status: AC | PRN
  Administered 2014-03-17: 5 mg via ORAL
  Filled 2014-03-17: qty 1

## 2014-03-17 MED ORDER — OXYCODONE HCL 5 MG/5ML PO SOLN
5.0000 mg | Freq: Once | ORAL | Status: AC | PRN
  Filled 2014-03-17: qty 5

## 2014-03-17 MED ORDER — PHENYLEPHRINE 40 MCG/ML (10ML) SYRINGE FOR IV PUSH (FOR BLOOD PRESSURE SUPPORT)
PREFILLED_SYRINGE | INTRAVENOUS | Status: AC
  Filled 2014-03-17: qty 10

## 2014-03-17 MED ORDER — MEPERIDINE HCL 50 MG/ML IJ SOLN: 6.2500 mg | INTRAMUSCULAR | Status: DC | PRN

## 2014-03-17 MED ORDER — PROMETHAZINE HCL 25 MG/ML IJ SOLN: 6.2500 mg | INTRAMUSCULAR | Status: DC | PRN

## 2014-03-17 MED ORDER — METHOCARBAMOL 500 MG PO TABS
500.0000 mg | ORAL_TABLET | Freq: Once | ORAL | Status: AC
  Administered 2014-03-17: 500 mg via ORAL
  Filled 2014-03-17: qty 1

## 2014-03-17 MED ORDER — SODIUM CHLORIDE 0.9 % IJ SOLN
INTRAMUSCULAR | Status: AC
  Filled 2014-03-17: qty 10

## 2014-03-17 MED ORDER — HYDROMORPHONE HCL PF 1 MG/ML IJ SOLN
INTRAMUSCULAR | Status: AC
  Filled 2014-03-17: qty 1

## 2014-03-17 MED ORDER — EPHEDRINE SULFATE 50 MG/ML IJ SOLN
INTRAMUSCULAR | Status: AC
  Filled 2014-03-17: qty 1

## 2014-03-17 MED ORDER — LACTATED RINGERS IV SOLN
INTRAVENOUS | Status: DC | PRN
  Administered 2014-03-17: 11:00:00 via INTRAVENOUS

## 2014-03-17 MED ORDER — LACTATED RINGERS IV SOLN
INTRAVENOUS | Status: DC
  Administered 2014-03-17: 12:00:00 via INTRAVENOUS
  Administered 2014-03-17: 1000 mL via INTRAVENOUS

## 2014-03-17 MED ORDER — ONDANSETRON HCL 4 MG/2ML IJ SOLN
INTRAMUSCULAR | Status: DC | PRN
  Administered 2014-03-17 (×2): 2 mg via INTRAVENOUS

## 2014-03-17 MED ORDER — FENTANYL CITRATE 0.05 MG/ML IJ SOLN
INTRAMUSCULAR | Status: DC | PRN
  Administered 2014-03-17: 50 ug via INTRAVENOUS
  Administered 2014-03-17: 100 ug via INTRAVENOUS

## 2014-03-17 MED ORDER — KETAMINE HCL 10 MG/ML IJ SOLN
INTRAMUSCULAR | Status: DC | PRN
  Administered 2014-03-17: 20 mg via INTRAVENOUS

## 2014-03-17 MED ORDER — LACTATED RINGERS IR SOLN
Status: DC | PRN
  Administered 2014-03-17: 12000 mL

## 2014-03-17 MED ORDER — METHOCARBAMOL 500 MG PO TABS: 500.0000 mg | ORAL_TABLET | Freq: Four times a day (QID) | ORAL | Status: DC

## 2014-03-17 SURGICAL SUPPLY — 28 items
BANDAGE ELASTIC 6 VELCRO ST LF (GAUZE/BANDAGES/DRESSINGS) ×2 IMPLANT
BLADE 4.2CUDA (BLADE) ×3 IMPLANT
CLOTH BEACON ORANGE TIMEOUT ST (SAFETY) ×3 IMPLANT
COUNTER NEEDLE 20 DBL MAG RED (NEEDLE) ×3 IMPLANT
COVER SURGICAL LIGHT HANDLE (MISCELLANEOUS) ×2 IMPLANT
CUFF TOURN SGL QUICK 34 (TOURNIQUET CUFF) ×3
CUFF TRNQT CYL 34X4X40X1 (TOURNIQUET CUFF) ×1 IMPLANT
DRAPE U-SHAPE 47X51 STRL (DRAPES) ×3 IMPLANT
DRSG EMULSION OIL 3X3 NADH (GAUZE/BANDAGES/DRESSINGS) ×3 IMPLANT
DRSG PAD ABDOMINAL 8X10 ST (GAUZE/BANDAGES/DRESSINGS) ×2 IMPLANT
DURAPREP 26ML APPLICATOR (WOUND CARE) ×3 IMPLANT
GLOVE BIO SURGEON STRL SZ8 (GLOVE) ×3 IMPLANT
GLOVE BIOGEL PI IND STRL 8 (GLOVE) ×1 IMPLANT
GLOVE BIOGEL PI INDICATOR 8 (GLOVE) ×2
GOWN STRL REUS W/TWL LRG LVL3 (GOWN DISPOSABLE) ×5 IMPLANT
HOVERMATT SINGLE USE (MISCELLANEOUS) ×2 IMPLANT
MANIFOLD NEPTUNE II (INSTRUMENTS) ×3 IMPLANT
PACK ARTHROSCOPY WL (CUSTOM PROCEDURE TRAY) ×3 IMPLANT
PACK ICE MAXI GEL EZY WRAP (MISCELLANEOUS) ×9 IMPLANT
PADDING CAST COTTON 6X4 STRL (CAST SUPPLIES) ×4 IMPLANT
POSITIONER SURGICAL ARM (MISCELLANEOUS) ×3 IMPLANT
SET ARTHROSCOPY TUBING (MISCELLANEOUS) ×3
SET ARTHROSCOPY TUBING LN (MISCELLANEOUS) ×1 IMPLANT
SPONGE GAUZE 4X4 12PLY (GAUZE/BANDAGES/DRESSINGS) ×2 IMPLANT
SUT ETHILON 4 0 PS 2 18 (SUTURE) ×3 IMPLANT
TOWEL OR 17X26 10 PK STRL BLUE (TOWEL DISPOSABLE) ×3 IMPLANT
WAND 90 DEG TURBOVAC W/CORD (SURGICAL WAND) ×2 IMPLANT
WRAP KNEE MAXI GEL POST OP (GAUZE/BANDAGES/DRESSINGS) ×3 IMPLANT

## 2014-03-17 NOTE — Progress Notes (Signed)
Has  Been treated for UTI since PAT visit and completed cipro

## 2014-03-17 NOTE — Interval H&P Note (Signed)
History and Physical Interval Note:  03/17/2014 11:02 AM  Kiara Brown  has presented today for surgery, with the diagnosis of left knee hypertrophic synovitis  The various methods of treatment have been discussed with the patient and family. After consideration of risks, benefits and other options for treatment, the patient has consented to  Procedure(s): ARTHROSCOPY LEFT KNEE WITH SYNOVECTOMY (Left) as a surgical intervention .  The patient's history has been reviewed, patient examined, no change in status, stable for surgery.  I have reviewed the patient's chart and labs.  Questions were answered to the patient's satisfaction.     Dione Plover Candyce Gambino

## 2014-03-17 NOTE — Anesthesia Postprocedure Evaluation (Signed)
Anesthesia Post Note  Patient: Kiara Brown  Procedure(s) Performed: Procedure(s) (LRB): ARTHROSCOPY LEFT KNEE WITH SYNOVECTOMY (Left)  Anesthesia type: General  Patient location: PACU  Post pain: Pain level controlled  Post assessment: Post-op Vital signs reviewed  Last Vitals: BP 110/61  Pulse 68  Temp(Src) 36.6 C (Oral)  Resp 16  SpO2 96%  Post vital signs: Reviewed  Level of consciousness: sedated  Complications: No apparent anesthesia complications

## 2014-03-17 NOTE — Anesthesia Preprocedure Evaluation (Signed)
Anesthesia Evaluation  Patient identified by MRN, date of birth, ID band Patient awake    Reviewed: Allergy & Precautions, H&P , NPO status , Patient's Chart, lab work & pertinent test results  History of Anesthesia Complications (+) PONV and history of anesthetic complications  Airway Mallampati: III TM Distance: <3 FB Neck ROM: Limited    Dental no notable dental hx.    Pulmonary sleep apnea ,    + decreased breath sounds      Cardiovascular hypertension, Pt. on medications and Pt. on home beta blockers + dysrhythmias Rhythm:Regular Rate:Normal     Neuro/Psych negative neurological ROS  negative psych ROS   GI/Hepatic Neg liver ROS, hiatal hernia, GERD-  ,  Endo/Other  Morbid obesity  Renal/GU negative Renal ROS     Musculoskeletal negative musculoskeletal ROS (+)   Abdominal   Peds  Hematology negative hematology ROS (+)   Anesthesia Other Findings   Reproductive/Obstetrics negative OB ROS                           Anesthesia Physical  Anesthesia Plan  ASA: III  Anesthesia Plan: General   Post-op Pain Management:    Induction: Intravenous  Airway Management Planned: LMA  Additional Equipment:   Intra-op Plan:   Post-operative Plan: Extubation in OR  Informed Consent: I have reviewed the patients History and Physical, chart, labs and discussed the procedure including the risks, benefits and alternatives for the proposed anesthesia with the patient or authorized representative who has indicated his/her understanding and acceptance.   Dental advisory given  Plan Discussed with: CRNA  Anesthesia Plan Comments:         Anesthesia Quick Evaluation

## 2014-03-17 NOTE — Op Note (Signed)
NAMEHARRY, BARK NO.:  [id number]  MEDICAL RECORD NO.:  [id number]  LOCATION:  [facility]                         FACILITY:  [facility]  PHYSICIAN:  Gaynelle Arabian, M.D.    DATE OF BIRTH:  December 25, 1940  DATE OF PROCEDURE: DATE OF DISCHARGE:  03/17/2014                              OPERATIVE REPORT   PREOPERATIVE DIAGNOSIS:  Left knee hypertrophic synovitis.  POSTOPERATIVE DIAGNOSIS:  Left knee hypertrophic synovitis.  PROCEDURE:  Left knee arthroscopy with synovectomy.  SURGEON:  Gaynelle Arabian, M.D.  ASSISTANT:  No assistant.  ANESTHESIA:  General.  ESTIMATED BLOOD LOSS:  Minimal.  DRAINS:  None.  COMPLICATIONS:  None.  CONDITION:  Stable to recovery.  BRIEF CLINICAL NOTE:  Ms. Lomeli is a 73 year old female, status post left total knee arthroplasty who has done extremely well and then over the past several months, she has developed painful popping in the knee. Exam and history consistent with patellar clunk syndrome.  She presents now for arthroscopy and synovectomy.  PROCEDURE IN DETAIL:  After successful administration of general anesthetic, tourniquet placed high on her left thigh and left lower extremity was prepped and draped in usual sterile fashion.  Standard superomedial, inferolateral incisions sites were made and the inflow cannula passed, superomedial camera passed inferolateral.  Arthroscopic visualization proceeds.  There is a large nodule of hypertrophic synovial tissue at the junction of the quadriceps tendon and the superior pole of patella.  This was essentially obliterated the suprapatellar pouch.  A superolateral portal site was created with an 11 blade and then using combination of the shaver and the ArthroCare device, this nodule was debrided back to normal-appearing tissue and cauterized.  The medial and lateral gutters were visualized.  Any abnormal tissue there was removed.  Inferiorly everything looks normal. Joints again  inspected.  No other abnormal tissue noted.  The suprapatellar pouch was completely restored.  The arthroscopic equipments were removed from the inferior portals which were closed with interrupted 4-0 nylon.  20 mL of 0.25% Marcaine with epinephrine was injected through the inflow cannula and that was removed and that portal closed with nylon.  Incisions cleaned and dried and a bulky sterile dressing applied.  She was then awakened and transported to recovery in stable condition.     Gaynelle Arabian, M.D.     FA/MEDQ  D:  03/17/2014  T:  03/17/2014  Job:  [id number]

## 2014-03-17 NOTE — Discharge Instructions (Signed)
Arthroscopic Procedure, Knee An arthroscopic procedure can find what is wrong with your knee. PROCEDURE Arthroscopy is a surgical technique that allows your orthopedic surgeon to diagnose and treat your knee injury with accuracy. They will look into your knee through a small instrument. This is almost like a small (pencil sized) telescope. Because arthroscopy affects your knee less than open knee surgery, you can anticipate a more rapid recovery. Taking an active role by following your caregiver's instructions will help with rapid and complete recovery. Use crutches, rest, elevation, ice, and knee exercises as instructed. The length of recovery depends on various factors including type of injury, age, physical condition, medical conditions, and your rehabilitation. Your knee is the joint between the large bones (femur and tibia) in your leg. Cartilage covers these bone ends which are smooth and slippery and allow your knee to bend and move smoothly. Two menisci, thick, semi-lunar shaped pads of cartilage which form a rim inside the joint, help absorb shock and stabilize your knee. Ligaments bind the bones together and support your knee joint. Muscles move the joint, help support your knee, and take stress off the joint itself. Because of this all programs and physical therapy to rehabilitate an injured or repaired knee require rebuilding and strengthening your muscles. AFTER THE PROCEDURE  After the procedure, you will be moved to a recovery area until most of the effects of the medication have worn off. Your caregiver will discuss the test results with you.   Only take over-the-counter or prescription medicines for pain, discomfort, or fever as directed by your caregiver.  SEEK MEDICAL CARE IF:   You have increased bleeding from your wounds.   You see redness, swelling, or have increasing pain in your wounds.   You have pus coming from your wound.   You have an oral temperature above 102 F (38.9  C).   You notice a bad smell coming from the wound or dressing.   You have severe pain with any motion of your knee.  SEEK IMMEDIATE MEDICAL CARE IF:   You develop a rash.   You have difficulty breathing.   You have any allergic problems.  FURTHER INSTRUCTIONS:  You may start showering two days after being discharged home but do not submerge the incisions under water.   Change dressing 48 hours after the procedure and then cover the small incisions with band aids until your follow up visit.  Avoid periods of inactivity such as sitting longer than an hour when not asleep. This helps prevent blood clots.   You may put full weight on your legs and walk as much as is comfortable.   Do not drive while taking narcotics.  Wear the elastic stockings for three weeks following surgery during the day but you may remove then at night.  Make sure you keep all of your appointments after your operation with all of your doctors and caregivers. You should call the office at [phone] and make an appointment for approximately one week after the date of your surgery.  Please pick up a stool softener and laxative for home use as long as you are requiring pain medications.  Continue to use ice on the knee for pain and swelling from surgery. You may notice swelling that will progress down to the foot and ankle.  This is normal after surgery.  Elevate the leg when you are not up walking on it.   RANGE OF MOTION AND STRENGTHENING EXERCISES  Rehabilitation of the knee is  important following a knee injury or an operation. After just a few days of immobilization, the muscles of the thigh which control the knee become weakened and shrink (atrophy). Knee exercises are designed to build up the tone and strength of the thigh muscles and to improve knee motion. Often times heat used for twenty to thirty minutes before working out will loosen up your tissues and help with improving the range of motion but do not  use heat for the first two weeks following surgery. These exercises can be done on a training (exercise) mat, on the floor, on a table or on a bed. Use what ever works the best and is most comfortable for you Knee exercises include: ° ° ° ° ° ° °QUAD STRENGTHENING EXERCISES °Strengthening Quadriceps Sets ° °Tighten muscles on top of thigh by pushing knees down into floor or table. °Hold for 20 seconds. Repeat 10 times. °Do 2 sessions per day. ° ° ° °Strengthening Terminal Knee Extension ° °With knee bent over bolster, straighten knee by tightening muscle on top of thigh. Be sure to keep bottom of knee on bolster. °Hold for 20 seconds. Repeat 10 times. °Do 2 sessions per day. ° ° °Straight Leg with Bent Knee ° °Lie on back with opposite leg bent. Keep involved knee slightly bent at knee and raise leg 4-6". Hold for 10 seconds. °Repeat 20 times per set. °Do 2 sets per session. °Do 2 sessions per day. ° °

## 2014-03-17 NOTE — Brief Op Note (Signed)
03/17/2014  12:00 PM  PATIENT:  Kiara Brown  73 y.o. female  PRE-OPERATIVE DIAGNOSIS:  left knee hypertrophic synovitis  POST-OPERATIVE DIAGNOSIS:  left knee hypertrophic synovitis  PROCEDURE:  Procedure(s): ARTHROSCOPY LEFT KNEE WITH SYNOVECTOMY (Left)  SURGEON:  Surgeon(s) and Role:    * Gearlean Alf, MD - Primary  PHYSICIAN ASSISTANT:   ASSISTANTS: none   ANESTHESIA:   general  EBL:  Total I/O In: 0  Out: 40 [Blood:40]  LOCAL MEDICATIONS USED:  MARCAINE     COUNTS:  YES  TOURNIQUET:   Total Tourniquet Time Documented: Thigh (Left) - 0 minutes Total: Thigh (Left) - 0 minutes   DICTATION: .Other Dictation: Dictation Number [id number]  PLAN OF CARE: Discharge to home after PACU  PATIENT DISPOSITION:  PACU - hemodynamically stable.

## 2014-03-17 NOTE — Transfer of Care (Signed)
Immediate Anesthesia Transfer of Care Note  Patient: Kiara Brown  Procedure(s) Performed: Procedure(s): ARTHROSCOPY LEFT KNEE WITH SYNOVECTOMY (Left)  Patient Location: PACU  Anesthesia Type:General  Level of Consciousness: sedated and patient cooperative  Airway & Oxygen Therapy: Patient Spontanous Breathing and Patient connected to face mask oxygen  Post-op Assessment: Report given to PACU RN and Post -op Vital signs reviewed and stable  Post vital signs: stable  Complications: No apparent anesthesia complications

## 2014-03-18 ENCOUNTER — Encounter (HOSPITAL_COMMUNITY): Payer: Self-pay | Admitting: Orthopedic Surgery

## 2015-03-15 ENCOUNTER — Ambulatory Visit: Payer: Self-pay | Admitting: Orthopedic Surgery

## 2015-03-15 NOTE — H&P (Signed)
Darrick Penna DOB: 01/16/1941 Married / Language: Cleophus Molt / Race: White Female Date of Admission:  04/11/2015 CC:  Right Knee Pain History of Present Illness The patient is a 74 year old female who comes in for a preoperative History and Physical. The patient is scheduled for a right total knee arthroplasty to be performed by Dr. Dione Plover. Aluisio, MD at [facility] on 04-11-2015. The patient is a 74 year old female who presents for follow up of their knee. The patient is being followed for their right knee pain. They are several months out from Synvisc series. Symptoms reported include: pain, aching, instability and difficulty ambulating. The patient feels that they are doing poorly. Current treatment includes: relative rest and icing after activity. The following medication has been used for pain control: tramadol. She states the Visco just really has not helped much. The cortisone has worked well for her, but it is short lived. She has decided to go ahead and get her knee replaced and has got that scheduled for May and does have an appointment to come in and see Dr. Wynelle Link for preoperatively discussion. She is going great with her total knee on the left. She is now set up for the right knee to be replaced. They have been treated conservatively in the past for the above stated problem and despite conservative measures, they continue to have progressive pain and severe functional limitations and dysfunction. They have failed non-operative management including home exercise, medications, and injections. It is felt that they would benefit from undergoing total joint replacement. Risks and benefits of the procedure have been discussed with the patient and they elect to proceed with surgery. There are no active contraindications to surgery such as ongoing infection or rapidly progressive neurological disease.  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 brother Sister 1 Living. age 84, Myasthenia Gravis Cerebrovascular Accident mother, brother and grandfather fathers side Heart Disease mother, father, brother, grandfather mothers side and grandfather fathers side Diabetes Mellitus mother, sister and brother Heart disease in female family member before age 60 Cancer mother, sister and brother Congestive Heart Failure mother and brother  Social History Living situation live with spouse Illicit drug use no Tobacco use Never smoker. never smoker Exercise Exercises weekly; does other Drug/Alcohol Rehab (Currently) no Marital status married Number of flights of stairs before winded less than 1 Children 2 Current work status retired Alcohol use never consumed alcohol Previously in rehab no Pain Contract no Tobacco / smoke exposure no Advance Directives Living Will, Healthcare POA  Medication History  Tylenol (prn) Active. Aspirin Childrens (81MG  Tablet Chewable, Oral) Active. Vitamin (Oral daily) Active. Orencia (250MG  For Solution, Intravenous) Active. BuPROPion HCl ER (XL) (150MG  Tablet ER 24HR, Oral) Active. Atenolol (50MG  Tablet, Oral) Active. Folic Acid (1MG  Tablet, Oral) Active. Omeprazole (20MG  Capsule DR, Oral) Active. TraMADol HCl (50MG  Tablet, Oral) Active.  Past Surgical History Rotator Cuff Repair  right Arthroscopy of Knee bilateral Hysterectomy Date: 15. partial (non-cancerous) Gallbladder Surgery laporoscopic Other Surgery Ulnar nerve release on right arm Arthroscopy of Shoulder right  Cystoscopy Benign Tumor Removal and Sling Placement Breast Biopsy left; Benign Appendectomy Date: 74. Done at same time of Hysterectomy Total Knee Replacement - Left Date: 2013.  Review of Systems General Present- Fatigue. Not Present- Chills, Fever, Memory Loss, Night Sweats, Weight Gain and Weight Loss. Skin Not Present- Eczema, Hives, Itching, Lesions and Rash. HEENT Not Present- Dentures, Double Vision, Headache, Hearing Loss, Tinnitus and Visual Loss. Respiratory Present- Shortness of breath with exertion. Not Present- Allergies, Chronic Cough, Coughing up blood and Shortness of breath at rest. Cardiovascular Not Present- Chest Pain, Difficulty Breathing Lying Down, Murmur, Palpitations, Racing/skipping heartbeats and Swelling. Gastrointestinal Not Present- Abdominal Pain, Bloody Stool, Constipation, Diarrhea, Difficulty Swallowing, Heartburn, Jaundice, Loss of appetitie, Nausea and Vomiting. Female Genitourinary Not Present- Blood in Urine, Discharge, Flank Pain, Incontinence, Painful Urination, Urgency, Urinary frequency, Urinary Retention, Urinating at Night and Weak urinary stream. Musculoskeletal Present- Back Pain, Joint Pain, Joint Swelling, Morning Stiffness and Muscle Pain. Not Present- Muscle Weakness and Spasms. Neurological Not Present- Blackout spells, Difficulty with balance, Dizziness, Paralysis, Tremor and Weakness. Psychiatric Not Present- Insomnia.  Vitals Weight: 246 lb Height: 63in Body Surface Area: 2.11 m Body Mass Index: 43.58 kg/m  BP: 158/82 (Sitting, Left Arm, Standard)  Physical Exam General Mental Status -Alert, cooperative and good historian. General Appearance-pleasant, Not in acute distress. Orientation-Oriented X3. Build &  Nutrition-Well nourished and Well developed.  Head and Neck Head-normocephalic, atraumatic . Neck Global Assessment - supple, no bruit auscultated on the right, no bruit auscultated on the left.  Eye Pupil - Bilateral-Regular and Round. Motion - Bilateral-EOMI. Note: Patient does wear contacts.   Chest and Lung Exam Auscultation Breath sounds - clear at anterior chest wall and clear at posterior chest wall. Adventitious sounds - No Adventitious sounds.  Cardiovascular Auscultation Rhythm - Regular rate and rhythm. Heart Sounds - S1 WNL and S2 WNL. Murmurs & Other Heart Sounds - Auscultation of the heart reveals - No Murmurs.  Abdomen Inspection Contour - Generalized moderate distention. Palpation/Percussion Tenderness - Abdomen is non-tender to palpation. Rigidity (guarding) - Abdomen is soft. Auscultation Auscultation of the abdomen reveals - Bowel sounds normal.  Female Genitourinary Note: Not done, not pertinent to present illness   Musculoskeletal Note: As far as her right knee is concerned today, she has full extension and further flexion to 115 degrees. Crepitation throughout the range of motion. No effusion. Hip and thigh obesity is noted. RADIOGRAPHS: New x rays are obtained and show bone on bone medial compartment, moderate lateral, and moderately severe patellofemoral changes.  Assessment & Plan Primary osteoarthritis of right knee (M17.11) Note:Surgical Plans: Right Total Knee Replacement  Disposition: Home versus [place]  PCP: Dr. Nance Pew - Patient has been seen preoperatively and felt to be stable for surgery.  IV TXA  Anesthesia Issues: None  Signed electronically by Joelene Millin, III PA-C

## 2015-03-15 NOTE — H&P (Signed)
H&P     Kiara Brown DOB: 1941-07-23 Married / Language: Cleophus Molt / Race: White Female Date of Admission: 04/11/2015 CC: Right Knee Pain History of Present Illness The patient is a 74 year old female who comes in for a preoperative History and Physical. The patient is scheduled for a right total knee arthroplasty to be performed by Dr. Dione Plover. Aluisio, MD at [facility] on 04-11-2015. The patient is a 74 year old female who presents for follow up of their knee. The patient is being followed for their right knee pain. They are several months out from Synvisc series. Symptoms reported include: pain, aching, instability and difficulty ambulating. The patient feels that they are doing poorly. Current treatment includes: relative rest and icing after activity. The following medication has been used for pain control: tramadol. She states the Visco just really has not helped much. The cortisone has worked well for her, but it is short lived. She has decided to go ahead and get her knee replaced and has got that scheduled for May and does have an appointment to come in and see Dr. Wynelle Link for preoperatively discussion. She is going great with her total knee on the left. She is now set up for the right knee to be replaced. They have been treated conservatively in the past for the above stated problem and despite conservative measures, they continue to have progressive pain and severe functional limitations and dysfunction. They have failed non-operative management including home exercise, medications, and injections. It is felt that they would benefit from undergoing total joint replacement. Risks and benefits of the procedure have been discussed with the patient and they elect to proceed with surgery. There are no active contraindications to surgery such as ongoing infection or rapidly progressive neurological disease.  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 brother Sister 1 Living. age 51, Myasthenia Gravis Cerebrovascular Accident mother, brother and grandfather fathers side Heart Disease mother, father, brother, grandfather mothers side and grandfather fathers side Diabetes Mellitus mother, sister and brother Heart disease in female family member before age 19 Cancer mother, sister and brother Congestive Heart Failure mother and brother  Social History Living situation live with spouse Illicit drug use no Tobacco use Never smoker. never smoker Exercise Exercises weekly; does other Drug/Alcohol Rehab (Currently) no Marital status married Number of flights of stairs before winded less than 1 Children 2 Current work status retired Alcohol use never consumed alcohol Previously in rehab no Pain Contract no Tobacco / smoke exposure no Advance Directives Living Will, Healthcare POA  Medication History  Tylenol (prn) Active. Aspirin Childrens (81MG  Tablet Chewable, Oral) Active. Vitamin (Oral daily) Active. Orencia (250MG  For Solution, Intravenous) Active. BuPROPion HCl ER (XL) (150MG  Tablet ER 24HR, Oral) Active. Atenolol (50MG  Tablet, Oral) Active. Folic Acid (1MG  Tablet, Oral) Active. Omeprazole (20MG  Capsule DR, Oral) Active. TraMADol HCl (50MG  Tablet, Oral) Active.  Past Surgical History Rotator  Cuff Repair right Arthroscopy of Knee bilateral Hysterectomy Date: 13. partial (non-cancerous) Gallbladder Surgery laporoscopic Other Surgery Ulnar nerve release on right arm Arthroscopy  of Shoulder right Cystoscopy Benign Tumor Removal and Sling Placement Breast Biopsy left; Benign Appendectomy Date: 42. Done at same time of Hysterectomy Total Knee Replacement - Left Date: 2013.  Review of Systems General Present- Fatigue. Not Present- Chills, Fever, Memory Loss, Night Sweats, Weight Gain and Weight Loss. Skin Not Present- Eczema, Hives, Itching, Lesions and Rash. HEENT Not Present- Dentures, Double Vision, Headache, Hearing Loss, Tinnitus and Visual Loss. Respiratory Present- Shortness of breath with exertion. Not Present- Allergies, Chronic Cough, Coughing up blood and Shortness of breath at rest. Cardiovascular Not Present- Chest Pain, Difficulty Breathing Lying Down, Murmur, Palpitations, Racing/skipping heartbeats and Swelling. Gastrointestinal Not Present- Abdominal Pain, Bloody Stool, Constipation, Diarrhea, Difficulty Swallowing, Heartburn, Jaundice, Loss of appetitie, Nausea and Vomiting. Female Genitourinary Not Present- Blood in Urine, Discharge, Flank Pain, Incontinence, Painful Urination, Urgency, Urinary frequency, Urinary Retention, Urinating at Night and Weak urinary stream. Musculoskeletal Present- Back Pain, Joint Pain, Joint Swelling, Morning Stiffness and Muscle Pain. Not Present- Muscle Weakness and Spasms. Neurological Not Present- Blackout spells, Difficulty with balance, Dizziness, Paralysis, Tremor and Weakness. Psychiatric Not Present- Insomnia.  Vitals Weight: 246 lb Height: 63in Body Surface Area: 2.11 m Body Mass Index: 43.58 kg/m  BP: 158/82 (Sitting, Left Arm, Standard)  Physical Exam General Mental Status -Alert, cooperative and good historian. General Appearance-pleasant, Not in acute distress. Orientation-Oriented  X3. Build & Nutrition-Well nourished and Well developed.  Head and Neck Head-normocephalic, atraumatic . Neck Global Assessment - supple, no bruit auscultated on the right, no bruit auscultated on the left.  Eye Pupil - Bilateral-Regular and Round. Motion - Bilateral-EOMI. Note: Patient does wear contacts.   Chest and Lung Exam Auscultation Breath sounds - clear at anterior chest wall and clear at posterior chest wall. Adventitious sounds - No Adventitious sounds.  Cardiovascular Auscultation Rhythm - Regular rate and rhythm. Heart Sounds - S1 WNL and S2 WNL. Murmurs & Other Heart Sounds - Auscultation of the heart reveals - No Murmurs.  Abdomen Inspection Contour - Generalized moderate distention. Palpation/Percussion Tenderness - Abdomen is non-tender to palpation. Rigidity (guarding) - Abdomen is soft. Auscultation Auscultation of the abdomen reveals - Bowel sounds normal.  Female Genitourinary Note: Not done, not pertinent to present illness   Musculoskeletal Note: As far as her right knee is concerned today, she has full extension and further flexion to 115 degrees. Crepitation throughout the range of motion. No effusion. Hip and thigh obesity is noted. RADIOGRAPHS: New x rays are obtained and show bone on bone medial compartment, moderate lateral, and moderately severe patellofemoral changes.  Assessment & Plan Primary osteoarthritis of right knee (M17.11) Note:Surgical Plans: Right Total Knee Replacement  Disposition: Home versus [place]  PCP: Dr. Nance Pew - Patient has been seen preoperatively and felt to be stable for surgery.  IV TXA  Anesthesia Issues: None  Signed electronically by Joelene Millin, III PA-C

## 2015-03-24 NOTE — Progress Notes (Signed)
Please put orders in Epic surgery 04-11-15 pre op 04-06-15 Thanks

## 2015-03-31 ENCOUNTER — Ambulatory Visit: Payer: Self-pay | Admitting: Orthopedic Surgery

## 2015-03-31 NOTE — Progress Notes (Signed)
Preoperative surgical orders have been place into the Epic hospital system for Kiara Brown on 03/31/2015, 9:31 PM  by Mickel Crow for surgery on 04-11-15.  Preop Total Knee orders including Experal, IV Tylenol, and IV Decadron as long as there are no contraindications to the above medications. Arlee Muslim, PA-C

## 2015-04-05 ENCOUNTER — Other Ambulatory Visit (HOSPITAL_COMMUNITY): Payer: Self-pay | Admitting: *Deleted

## 2015-04-05 NOTE — Progress Notes (Signed)
Medical Clearance note from Dr. Nance Pew 03-08-15 on chart for 04-11-15 surgery

## 2015-04-05 NOTE — Patient Instructions (Addendum)
Kiara Brown  04/05/2015   Your procedure is scheduled on:  04-11-15  Report to [facility] Main  Entrance and follow signs to               [place] at 6:25 AM.  Call this number if you have problems the morning of surgery [phone]   Remember: ONLY 1 PERSON MAY GO WITH YOU TO SHORT STAY TO GET  READY MORNING OF [place].  Do not eat food or drink liquids :After Midnight.     Take these medicines the morning of surgery with A SIP OF WATER: atenolol, bupropion, duloxetine, omeprazole                               You may not have any metal on your body including hair pins and              piercings  Do not wear jewelry, make-up, lotions, powders or perfumes., deodorant.             Do not wear nail polish.  Do not shave  48 hours prior to surgery.     Do not bring valuables to the hospital. [place].  Contacts, dentures or bridgework may not be worn into surgery.  Leave suitcase in the car. After surgery it may be brought to your room.      :  Special Instructions: coughing and deep breathing exercises, leg exercises              Please read over the following fact sheets you were given: _____________________________________________________________________             [facility] - Preparing for Surgery Before surgery, you can play an important role.  Because skin is not sterile, your skin needs to be as free of germs as possible.  You can reduce the number of germs on your skin by washing with CHG (chlorahexidine gluconate) soap before surgery.  CHG is an antiseptic cleaner which kills germs and bonds with the skin to continue killing germs even after washing. Please DO NOT use if you have an allergy to CHG or antibacterial soaps.  If your skin becomes reddened/irritated stop using the CHG and inform your nurse when you arrive at Short Stay. Do not shave (including legs and underarms)  for at least 48 hours prior to the first CHG shower.  You may shave your face/neck. Please follow these instructions carefully:  1.  Shower with CHG Soap the night before surgery and the  morning of Surgery.  2.  If you choose to wash your hair, wash your hair first as usual with your  normal  shampoo.  3.  After you shampoo, rinse your hair and body thoroughly to remove the  shampoo.                           4.  Use CHG as you would any other liquid soap.  You can apply chg directly  to the skin and wash                       Gently with a scrungie or clean washcloth.  5.  Apply the CHG Soap to your body ONLY FROM THE NECK DOWN.   Do not use on face/ open                           Wound or open sores. Avoid contact with eyes, ears mouth and genitals (private parts).                       Wash face,  Genitals (private parts) with your normal soap.             6.  Wash thoroughly, paying special attention to the area where your surgery  will be performed.  7.  Thoroughly rinse your body with warm water from the neck down.  8.  DO NOT shower/wash with your normal soap after using and rinsing off  the CHG Soap.                9.  Pat yourself dry with a clean towel.            10.  Wear clean pajamas.            11.  Place clean sheets on your bed the night of your first shower and do not  sleep with pets. Day of Surgery : Do not apply any lotions/deodorants the morning of surgery.  Please wear clean clothes to the hospital/surgery center.  FAILURE TO FOLLOW THESE INSTRUCTIONS MAY RESULT IN THE CANCELLATION OF YOUR SURGERY PATIENT SIGNATURE_________________________________  NURSE SIGNATURE__________________________________  ________________________________________________________________________  WHAT IS A BLOOD TRANSFUSION? Blood Transfusion Information  A transfusion is the replacement of blood or some of its parts. Blood is made up of multiple cells which provide different  functions.  Red blood cells carry oxygen and are used for blood loss replacement.  White blood cells fight against infection.  Platelets control bleeding.  Plasma helps clot blood.  Other blood products are available for specialized needs, such as hemophilia or other clotting disorders. BEFORE THE TRANSFUSION  Who gives blood for transfusions?   Healthy volunteers who are fully evaluated to make sure their blood is safe. This is blood bank blood. Transfusion therapy is the safest it has ever been in the practice of medicine. Before blood is taken from a donor, a complete history is taken to make sure that person has no history of diseases nor engages in risky social behavior (examples are intravenous drug use or sexual activity with multiple partners). The donor's travel history is screened to minimize risk of transmitting infections, such as malaria. The donated blood is tested for signs of infectious diseases, such as HIV and hepatitis. The blood is then tested to be sure it is compatible with you in order to minimize the chance of a transfusion reaction. If you or a relative donates blood, this is often done in anticipation of surgery and is not appropriate for emergency situations. It takes many days to process the donated blood. RISKS AND COMPLICATIONS Although transfusion therapy is very safe and saves many lives, the main dangers of transfusion include:  1. Getting an infectious disease. 2. Developing a transfusion reaction. This is an allergic reaction to something in the blood you were given. Every precaution is taken to prevent this. The decision to have a blood transfusion has been considered carefully by your caregiver before blood is given. Blood is not given unless the benefits outweigh the risks. AFTER THE TRANSFUSION  Right after receiving a  blood transfusion, you will usually feel much better and more energetic. This is especially true if your red blood cells have gotten low  (anemic). The transfusion raises the level of the red blood cells which carry oxygen, and this usually causes an energy increase.  The nurse administering the transfusion will monitor you carefully for complications. HOME CARE INSTRUCTIONS  No special instructions are needed after a transfusion. You may find your energy is better. Speak with your caregiver about any limitations on activity for underlying diseases you may have. SEEK MEDICAL CARE IF:   Your condition is not improving after your transfusion.  You develop redness or irritation at the intravenous (IV) site. SEEK IMMEDIATE MEDICAL CARE IF:  Any of the following symptoms occur over the next 12 hours:  Shaking chills.  You have a temperature by mouth above 102 F (38.9 C), not controlled by medicine.  Chest, back, or muscle pain.  People around you feel you are not acting correctly or are confused.  Shortness of breath or difficulty breathing.  Dizziness and fainting.  You get a rash or develop hives.  You have a decrease in urine output.  Your urine turns a dark color or changes to pink, red, or brown. Any of the following symptoms occur over the next 10 days:  You have a temperature by mouth above 102 F (38.9 C), not controlled by medicine.  Shortness of breath.  Weakness after normal activity.  The white part of the eye turns yellow (jaundice).  You have a decrease in the amount of urine or are urinating less often.  Your urine turns a dark color or changes to pink, red, or brown. Document Released: 11/09/2000 Document Revised: 02/04/2012 Document Reviewed: 06/28/2008 ExitCare Patient Information 2014 [place].  _______________________________________________________________________  Incentive Spirometer  An incentive spirometer is a tool that can help keep your lungs clear and active. This tool measures how well you are filling your lungs with each breath. Taking long deep breaths may help  reverse or decrease the chance of developing breathing (pulmonary) problems (especially infection) following:  A long period of time when you are unable to move or be active. BEFORE THE PROCEDURE   If the spirometer includes an indicator to show your best effort, your nurse or respiratory therapist will set it to a desired goal.  If possible, sit up straight or lean slightly forward. Try not to slouch.  Hold the incentive spirometer in an upright position. INSTRUCTIONS FOR USE  3. Sit on the edge of your bed if possible, or sit up as far as you can in bed or on a chair. 4. Hold the incentive spirometer in an upright position. 5. Breathe out normally. 6. Place the mouthpiece in your mouth and seal your lips tightly around it. 7. Breathe in slowly and as deeply as possible, raising the piston or the ball toward the top of the column. 8. Hold your breath for 3-5 seconds or for as long as possible. Allow the piston or ball to fall to the bottom of the column. 9. Remove the mouthpiece from your mouth and breathe out normally. 10. Rest for a few seconds and repeat Steps 1 through 7 at least 10 times every 1-2 hours when you are awake. Take your time and take a few normal breaths between deep breaths. 11. The spirometer may include an indicator to show your best effort. Use the indicator as a goal to work toward during each repetition. 12. After each set of 10  deep breaths, practice coughing to be sure your lungs are clear. If you have an incision (the cut made at the time of surgery), support your incision when coughing by placing a pillow or rolled up towels firmly against it. Once you are able to get out of bed, walk around indoors and cough well. You may stop using the incentive spirometer when instructed by your caregiver.  RISKS AND COMPLICATIONS  Take your time so you do not get dizzy or light-headed.  If you are in pain, you may need to take or ask for pain medication before doing  incentive spirometry. It is harder to take a deep breath if you are having pain. AFTER USE  Rest and breathe slowly and easily.  It can be helpful to keep track of a log of your progress. Your caregiver can provide you with a simple table to help with this. If you are using the spirometer at home, follow these instructions: [place] IF:   You are having difficultly using the spirometer.  You have trouble using the spirometer as often as instructed.  Your pain medication is not giving enough relief while using the spirometer.  You develop fever of 100.5 F (38.1 C) or higher. SEEK IMMEDIATE MEDICAL CARE IF:   You cough up bloody sputum that had not been present before.  You develop fever of 102 F (38.9 C) or greater.  You develop worsening pain at or near the incision site. MAKE SURE YOU:   Understand these instructions.  Will watch your condition.  Will get help right away if you are not doing well or get worse. Document Released: 03/25/2007 Document Revised: 02/04/2012 Document Reviewed: 05/26/2007 [facility] Patient Information 2014 [place], [place].   ________________________________________________________________________

## 2015-04-06 ENCOUNTER — Encounter (HOSPITAL_COMMUNITY)
Admission: RE | Admit: 2015-04-06 | Discharge: 2015-04-06 | Disposition: A | Discharge status: Home / Self Care | Payer: Medicare Other | Source: Ambulatory Visit | Attending: Orthopedic Surgery | Admitting: Orthopedic Surgery

## 2015-04-06 ENCOUNTER — Encounter (HOSPITAL_COMMUNITY): Payer: Self-pay

## 2015-04-06 ENCOUNTER — Other Ambulatory Visit: Payer: Self-pay

## 2015-04-06 DIAGNOSIS — M1711 Unilateral primary osteoarthritis, right knee: Secondary | ICD-10-CM | POA: Diagnosis not present

## 2015-04-06 DIAGNOSIS — Z01810 Encounter for preprocedural cardiovascular examination: Secondary | ICD-10-CM | POA: Diagnosis not present

## 2015-04-06 DIAGNOSIS — Z01812 Encounter for preprocedural laboratory examination: Secondary | ICD-10-CM | POA: Diagnosis present

## 2015-04-06 HISTORY — DX: Reserved for inherently not codable concepts without codable children: IMO0001

## 2015-04-06 LAB — URINALYSIS, ROUTINE W REFLEX MICROSCOPIC
Bilirubin Urine: NEGATIVE
GLUCOSE, UA: NEGATIVE mg/dL
KETONES UR: NEGATIVE mg/dL
NITRITE: NEGATIVE
PH: 6 (ref 5.0–8.0)
PROTEIN: NEGATIVE mg/dL
Specific Gravity, Urine: 1.008 (ref 1.005–1.030)
Urobilinogen, UA: 0.2 mg/dL (ref 0.0–1.0)

## 2015-04-06 LAB — COMPREHENSIVE METABOLIC PANEL
ALBUMIN: 3.8 g/dL (ref 3.5–5.0)
ALK PHOS: 89 U/L (ref 38–126)
ALT: 16 U/L (ref 14–54)
ANION GAP: 9 (ref 5–15)
AST: 20 U/L (ref 15–41)
BUN: 15 mg/dL (ref 6–20)
CHLORIDE: 98 mmol/L — AB (ref 101–111)
CO2: 27 mmol/L (ref 22–32)
Calcium: 8.9 mg/dL (ref 8.9–10.3)
Creatinine, Ser: 0.93 mg/dL (ref 0.44–1.00)
GFR calc Af Amer: >60 mL/min (ref >60)
GFR calc non Af Amer: 60 mL/min — ABNORMAL LOW (ref >60)
Glucose, Bld: 98 mg/dL (ref 70–99)
POTASSIUM: 4 mmol/L (ref 3.5–5.1)
SODIUM: 134 mmol/L — AB (ref 135–145)
TOTAL PROTEIN: 7 g/dL (ref 6.5–8.1)
Total Bilirubin: 0.5 mg/dL (ref 0.3–1.2)

## 2015-04-06 LAB — SURGICAL PCR SCREEN
MRSA, PCR: NEGATIVE
STAPHYLOCOCCUS AUREUS: NEGATIVE

## 2015-04-06 LAB — URINE MICROSCOPIC-ADD ON

## 2015-04-06 LAB — CBC
HCT: 38.8 % (ref 36.0–46.0)
Hemoglobin: 12.2 g/dL (ref 12.0–15.0)
MCH: 28.8 pg (ref 26.0–34.0)
MCHC: 31.4 g/dL (ref 30.0–36.0)
MCV: 91.7 fL (ref 78.0–100.0)
Platelets: 310 10*3/uL (ref 150–400)
RBC: 4.23 MIL/uL (ref 3.87–5.11)
RDW: 14.4 % (ref 11.5–15.5)
WBC: 9.2 10*3/uL (ref 4.0–10.5)

## 2015-04-06 LAB — PROTIME-INR
INR: 0.92 (ref 0.00–1.49)
PROTHROMBIN TIME: 12.5 s (ref 11.6–15.2)

## 2015-04-06 LAB — APTT: APTT: 35 s (ref 24–37)

## 2015-04-06 NOTE — Progress Notes (Signed)
Faxed UA and Micro results of 04-06-15 to Dr. Wynelle Link by [facility]

## 2015-04-06 NOTE — Progress Notes (Signed)
   04/06/15 1306  OBSTRUCTIVE SLEEP APNEA  Have you ever been diagnosed with sleep apnea through a sleep study? No  Do you snore loudly (loud enough to be heard through closed doors)?  1  Do you often feel tired, fatigued, or sleepy during the daytime? 1  Has anyone observed you stop breathing during your sleep? 0  Do you have, or are you being treated for high blood pressure? 1  BMI more than 35 kg/m2? 1  Age over 74 years old? 1  Neck circumference greater than 40 cm/16 inches? 0  Gender: 0  Obstructive Sleep Apnea Score 5

## 2015-04-07 NOTE — Progress Notes (Signed)
Pt will need new tube of blood drawn day of surgery due to positive antibodies with type and screen per PAT visit 04/06/2015

## 2015-04-08 ENCOUNTER — Other Ambulatory Visit (HOSPITAL_COMMUNITY): Payer: Self-pay | Admitting: *Deleted

## 2015-04-11 ENCOUNTER — Inpatient Hospital Stay (HOSPITAL_COMMUNITY): Payer: Medicare Other | Admitting: Anesthesiology

## 2015-04-11 ENCOUNTER — Encounter (HOSPITAL_COMMUNITY): Payer: Self-pay | Admitting: *Deleted

## 2015-04-11 ENCOUNTER — Inpatient Hospital Stay (HOSPITAL_COMMUNITY)
Admission: RE | Admit: 2015-04-11 | Discharge: 2015-04-13 | DRG: 470 | Disposition: A | Discharge status: Skilled Nursing Facility | Payer: Medicare Other | Source: Ambulatory Visit | Attending: Orthopedic Surgery | Admitting: Orthopedic Surgery

## 2015-04-11 ENCOUNTER — Encounter (HOSPITAL_COMMUNITY)
Admission: RE | Disposition: A | Discharge status: Skilled Nursing Facility | Payer: Self-pay | Source: Ambulatory Visit | Attending: Orthopedic Surgery

## 2015-04-11 DIAGNOSIS — M069 Rheumatoid arthritis, unspecified: Secondary | ICD-10-CM | POA: Diagnosis present

## 2015-04-11 DIAGNOSIS — K219 Gastro-esophageal reflux disease without esophagitis: Secondary | ICD-10-CM | POA: Diagnosis present

## 2015-04-11 DIAGNOSIS — M1711 Unilateral primary osteoarthritis, right knee: Secondary | ICD-10-CM | POA: Diagnosis present

## 2015-04-11 DIAGNOSIS — Z6841 Body Mass Index (BMI) 40.0 and over, adult: Secondary | ICD-10-CM

## 2015-04-11 DIAGNOSIS — G629 Polyneuropathy, unspecified: Secondary | ICD-10-CM | POA: Diagnosis present

## 2015-04-11 DIAGNOSIS — M179 Osteoarthritis of knee, unspecified: Secondary | ICD-10-CM | POA: Diagnosis present

## 2015-04-11 DIAGNOSIS — Z79899 Other long term (current) drug therapy: Secondary | ICD-10-CM

## 2015-04-11 DIAGNOSIS — Z96652 Presence of left artificial knee joint: Secondary | ICD-10-CM | POA: Diagnosis present

## 2015-04-11 DIAGNOSIS — M797 Fibromyalgia: Secondary | ICD-10-CM | POA: Diagnosis present

## 2015-04-11 DIAGNOSIS — E785 Hyperlipidemia, unspecified: Secondary | ICD-10-CM | POA: Diagnosis present

## 2015-04-11 DIAGNOSIS — G8929 Other chronic pain: Secondary | ICD-10-CM | POA: Diagnosis present

## 2015-04-11 DIAGNOSIS — K5732 Diverticulitis of large intestine without perforation or abscess without bleeding: Secondary | ICD-10-CM | POA: Diagnosis present

## 2015-04-11 DIAGNOSIS — Z7982 Long term (current) use of aspirin: Secondary | ICD-10-CM

## 2015-04-11 DIAGNOSIS — I10 Essential (primary) hypertension: Secondary | ICD-10-CM | POA: Diagnosis present

## 2015-04-11 DIAGNOSIS — Z90710 Acquired absence of both cervix and uterus: Secondary | ICD-10-CM | POA: Diagnosis not present

## 2015-04-11 DIAGNOSIS — M1710 Unilateral primary osteoarthritis, unspecified knee: Secondary | ICD-10-CM | POA: Diagnosis present

## 2015-04-11 HISTORY — PX: TOTAL KNEE ARTHROPLASTY: SHX125

## 2015-04-11 LAB — TYPE AND SCREEN
ABO/RH(D): A NEG
Antibody Screen: POSITIVE
DAT, IGG: NEGATIVE
UNIT DIVISION: 0

## 2015-04-11 SURGERY — ARTHROPLASTY, KNEE, TOTAL
Anesthesia: Spinal | Site: Knee | Laterality: Right

## 2015-04-11 MED ORDER — ONDANSETRON HCL 4 MG/2ML IJ SOLN: 4.0000 mg | Freq: Once | INTRAMUSCULAR | Status: DC | PRN

## 2015-04-11 MED ORDER — BUPIVACAINE LIPOSOME 1.3 % IJ SUSP
INTRAMUSCULAR | Status: DC | PRN
  Administered 2015-04-11: 20 mL

## 2015-04-11 MED ORDER — TRANEXAMIC ACID 1000 MG/10ML IV SOLN
1000.0000 mg | INTRAVENOUS | Status: AC
  Administered 2015-04-11: 1000 mg via INTRAVENOUS
  Filled 2015-04-11: qty 10

## 2015-04-11 MED ORDER — PROPOFOL 10 MG/ML IV BOLUS
INTRAVENOUS | Status: AC
  Filled 2015-04-11: qty 20

## 2015-04-11 MED ORDER — ACETAMINOPHEN 325 MG PO TABS: 650.0000 mg | ORAL_TABLET | Freq: Four times a day (QID) | ORAL | Status: DC | PRN

## 2015-04-11 MED ORDER — POLYETHYLENE GLYCOL 3350 17 G PO PACK: 17.0000 g | PACK | Freq: Every day | ORAL | Status: DC | PRN

## 2015-04-11 MED ORDER — DIPHENHYDRAMINE HCL 12.5 MG/5ML PO ELIX: 12.5000 mg | ORAL_SOLUTION | ORAL | Status: DC | PRN

## 2015-04-11 MED ORDER — ONDANSETRON HCL 4 MG/2ML IJ SOLN: 4.0000 mg | Freq: Four times a day (QID) | INTRAMUSCULAR | Status: DC | PRN

## 2015-04-11 MED ORDER — LACTATED RINGERS IV SOLN
INTRAVENOUS | Status: DC
  Administered 2015-04-11: 09:00:00 via INTRAVENOUS
  Administered 2015-04-11: 1000 mL via INTRAVENOUS
  Administered 2015-04-11: 10:00:00 via INTRAVENOUS

## 2015-04-11 MED ORDER — 0.9 % SODIUM CHLORIDE (POUR BTL) OPTIME
TOPICAL | Status: DC | PRN
  Administered 2015-04-11: 1000 mL

## 2015-04-11 MED ORDER — SODIUM CHLORIDE 0.9 % IR SOLN
Status: DC | PRN
  Administered 2015-04-11: 1000 mL

## 2015-04-11 MED ORDER — SODIUM CHLORIDE 0.9 % IV SOLN: INTRAVENOUS | Status: DC

## 2015-04-11 MED ORDER — KETOROLAC TROMETHAMINE 15 MG/ML IJ SOLN
7.5000 mg | Freq: Four times a day (QID) | INTRAMUSCULAR | Status: AC | PRN
  Administered 2015-04-11 – 2015-04-12 (×4): 7.5 mg via INTRAVENOUS
  Filled 2015-04-11 (×4): qty 1

## 2015-04-11 MED ORDER — ONDANSETRON HCL 4 MG/2ML IJ SOLN
INTRAMUSCULAR | Status: DC | PRN
  Administered 2015-04-11: 4 mg via INTRAVENOUS

## 2015-04-11 MED ORDER — DEXAMETHASONE SODIUM PHOSPHATE 10 MG/ML IJ SOLN
INTRAMUSCULAR | Status: AC
  Filled 2015-04-11: qty 1

## 2015-04-11 MED ORDER — DEXAMETHASONE SODIUM PHOSPHATE 10 MG/ML IJ SOLN
10.0000 mg | Freq: Once | INTRAMUSCULAR | Status: AC
  Administered 2015-04-11: 10 mg via INTRAVENOUS

## 2015-04-11 MED ORDER — PHENOL 1.4 % MT LIQD: 1.0000 | OROMUCOSAL | Status: DC | PRN

## 2015-04-11 MED ORDER — PHENYLEPHRINE 40 MCG/ML (10ML) SYRINGE FOR IV PUSH (FOR BLOOD PRESSURE SUPPORT)
PREFILLED_SYRINGE | INTRAVENOUS | Status: AC
  Filled 2015-04-11: qty 10

## 2015-04-11 MED ORDER — TRAMADOL HCL 50 MG PO TABS: 50.0000 mg | ORAL_TABLET | Freq: Four times a day (QID) | ORAL | Status: DC | PRN

## 2015-04-11 MED ORDER — ACETAMINOPHEN 10 MG/ML IV SOLN
1000.0000 mg | Freq: Once | INTRAVENOUS | Status: AC
  Administered 2015-04-11: 1000 mg via INTRAVENOUS
  Filled 2015-04-11: qty 100

## 2015-04-11 MED ORDER — MENTHOL 3 MG MT LOZG: 1.0000 | LOZENGE | OROMUCOSAL | Status: DC | PRN

## 2015-04-11 MED ORDER — CEFAZOLIN SODIUM-DEXTROSE 2-3 GM-% IV SOLR
INTRAVENOUS | Status: AC
  Filled 2015-04-11: qty 50

## 2015-04-11 MED ORDER — SODIUM CHLORIDE 0.9 % IJ SOLN
INTRAMUSCULAR | Status: AC
  Filled 2015-04-11: qty 10

## 2015-04-11 MED ORDER — METOCLOPRAMIDE HCL 5 MG/ML IJ SOLN
5.0000 mg | Freq: Three times a day (TID) | INTRAMUSCULAR | Status: DC | PRN
Start: 2015-04-11 — End: 2015-04-13

## 2015-04-11 MED ORDER — SODIUM CHLORIDE 0.9 % IJ SOLN
INTRAMUSCULAR | Status: DC | PRN
  Administered 2015-04-11: 30 mL

## 2015-04-11 MED ORDER — BUPROPION HCL ER (XL) 150 MG PO TB24
150.0000 mg | ORAL_TABLET | Freq: Every day | ORAL | Status: DC
  Administered 2015-04-12 – 2015-04-13 (×2): 150 mg via ORAL
  Filled 2015-04-11 (×3): qty 1

## 2015-04-11 MED ORDER — OXYCODONE HCL 5 MG PO TABS
5.0000 mg | ORAL_TABLET | ORAL | Status: DC | PRN
  Administered 2015-04-11 (×4): 5 mg via ORAL
  Administered 2015-04-12 (×2): 10 mg via ORAL
  Administered 2015-04-12: 5 mg via ORAL
  Administered 2015-04-12: 10 mg via ORAL
  Administered 2015-04-12: 5 mg via ORAL
  Administered 2015-04-12: 10 mg via ORAL
  Administered 2015-04-13: 5 mg via ORAL
  Administered 2015-04-13: 10 mg via ORAL
  Administered 2015-04-13: 5 mg via ORAL
  Administered 2015-04-13: 10 mg via ORAL
  Filled 2015-04-11: qty 2
  Filled 2015-04-11: qty 1
  Filled 2015-04-11 (×2): qty 2
  Filled 2015-04-11: qty 1
  Filled 2015-04-11: qty 2
  Filled 2015-04-11: qty 1
  Filled 2015-04-11: qty 2
  Filled 2015-04-11: qty 1
  Filled 2015-04-11 (×3): qty 2

## 2015-04-11 MED ORDER — DEXAMETHASONE SODIUM PHOSPHATE 10 MG/ML IJ SOLN
10.0000 mg | Freq: Once | INTRAMUSCULAR | Status: DC
  Filled 2015-04-11: qty 1

## 2015-04-11 MED ORDER — PANTOPRAZOLE SODIUM 40 MG PO TBEC
80.0000 mg | DELAYED_RELEASE_TABLET | Freq: Every day | ORAL | Status: DC
  Filled 2015-04-11: qty 2

## 2015-04-11 MED ORDER — METHOCARBAMOL 500 MG PO TABS
500.0000 mg | ORAL_TABLET | Freq: Four times a day (QID) | ORAL | Status: DC | PRN
  Administered 2015-04-11 – 2015-04-12 (×4): 500 mg via ORAL
  Filled 2015-04-11 (×5): qty 1

## 2015-04-11 MED ORDER — ONDANSETRON HCL 4 MG/2ML IJ SOLN
INTRAMUSCULAR | Status: AC
  Filled 2015-04-11: qty 2

## 2015-04-11 MED ORDER — ACETAMINOPHEN 650 MG RE SUPP: 650.0000 mg | Freq: Four times a day (QID) | RECTAL | Status: DC | PRN

## 2015-04-11 MED ORDER — RIVAROXABAN 10 MG PO TABS
10.0000 mg | ORAL_TABLET | Freq: Every day | ORAL | Status: DC
  Administered 2015-04-12 – 2015-04-13 (×2): 10 mg via ORAL
  Filled 2015-04-11 (×3): qty 1

## 2015-04-11 MED ORDER — PROPOFOL INFUSION 10 MG/ML OPTIME
INTRAVENOUS | Status: DC | PRN
  Administered 2015-04-11: 120 ug/kg/min via INTRAVENOUS

## 2015-04-11 MED ORDER — METOCLOPRAMIDE HCL 10 MG PO TABS: 5.0000 mg | ORAL_TABLET | Freq: Three times a day (TID) | ORAL | Status: DC | PRN

## 2015-04-11 MED ORDER — PROPOFOL 10 MG/ML IV BOLUS
INTRAVENOUS | Status: DC | PRN
  Administered 2015-04-11 (×3): 20 mg via INTRAVENOUS

## 2015-04-11 MED ORDER — DOCUSATE SODIUM 100 MG PO CAPS
100.0000 mg | ORAL_CAPSULE | Freq: Two times a day (BID) | ORAL | Status: DC
  Administered 2015-04-11 – 2015-04-13 (×4): 100 mg via ORAL

## 2015-04-11 MED ORDER — CEFAZOLIN SODIUM-DEXTROSE 2-3 GM-% IV SOLR
2.0000 g | INTRAVENOUS | Status: AC
Start: 2015-04-11 — End: 2015-04-11
  Administered 2015-04-11: 2 g via INTRAVENOUS

## 2015-04-11 MED ORDER — HYDROMORPHONE HCL 1 MG/ML IJ SOLN: 0.2500 mg | INTRAMUSCULAR | Status: DC | PRN

## 2015-04-11 MED ORDER — BISACODYL 10 MG RE SUPP: 10.0000 mg | Freq: Every day | RECTAL | Status: DC | PRN

## 2015-04-11 MED ORDER — PHENYLEPHRINE HCL 10 MG/ML IJ SOLN
INTRAMUSCULAR | Status: DC | PRN
  Administered 2015-04-11 (×5): 80 ug via INTRAVENOUS

## 2015-04-11 MED ORDER — DULOXETINE HCL 30 MG PO CPEP
30.0000 mg | ORAL_CAPSULE | Freq: Every day | ORAL | Status: DC
  Administered 2015-04-12 – 2015-04-13 (×2): 30 mg via ORAL
  Filled 2015-04-11 (×2): qty 1

## 2015-04-11 MED ORDER — CEFAZOLIN SODIUM-DEXTROSE 2-3 GM-% IV SOLR
2.0000 g | Freq: Four times a day (QID) | INTRAVENOUS | Status: AC
  Administered 2015-04-11 (×2): 2 g via INTRAVENOUS
  Filled 2015-04-11 (×2): qty 50

## 2015-04-11 MED ORDER — MORPHINE SULFATE 2 MG/ML IJ SOLN
1.0000 mg | INTRAMUSCULAR | Status: DC | PRN
  Administered 2015-04-11: 1 mg via INTRAVENOUS
  Administered 2015-04-11: 2 mg via INTRAVENOUS
  Filled 2015-04-11 (×2): qty 1

## 2015-04-11 MED ORDER — BUPIVACAINE LIPOSOME 1.3 % IJ SUSP
20.0000 mL | Freq: Once | INTRAMUSCULAR | Status: DC
  Filled 2015-04-11: qty 20

## 2015-04-11 MED ORDER — ONDANSETRON HCL 4 MG PO TABS: 4.0000 mg | ORAL_TABLET | Freq: Four times a day (QID) | ORAL | Status: DC | PRN

## 2015-04-11 MED ORDER — EPHEDRINE SULFATE 50 MG/ML IJ SOLN
INTRAMUSCULAR | Status: AC
  Filled 2015-04-11: qty 1

## 2015-04-11 MED ORDER — MEPERIDINE HCL 50 MG/ML IJ SOLN: 6.2500 mg | INTRAMUSCULAR | Status: DC | PRN

## 2015-04-11 MED ORDER — FLEET ENEMA 7-19 GM/118ML RE ENEM: 1.0000 | ENEMA | Freq: Once | RECTAL | Status: AC | PRN

## 2015-04-11 MED ORDER — ACETAMINOPHEN 500 MG PO TABS
1000.0000 mg | ORAL_TABLET | Freq: Four times a day (QID) | ORAL | Status: AC
  Administered 2015-04-11 – 2015-04-12 (×4): 1000 mg via ORAL
  Filled 2015-04-11 (×4): qty 2

## 2015-04-11 MED ORDER — METHOCARBAMOL 1000 MG/10ML IJ SOLN
500.0000 mg | Freq: Four times a day (QID) | INTRAVENOUS | Status: DC | PRN
  Filled 2015-04-11 (×2): qty 5

## 2015-04-11 MED ORDER — SODIUM CHLORIDE 0.9 % IJ SOLN
INTRAMUSCULAR | Status: AC
  Filled 2015-04-11: qty 50

## 2015-04-11 MED ORDER — SODIUM CHLORIDE 0.9 % IV SOLN
INTRAVENOUS | Status: DC
  Administered 2015-04-11 – 2015-04-12 (×2): via INTRAVENOUS

## 2015-04-11 MED ORDER — ATENOLOL 100 MG PO TABS
100.0000 mg | ORAL_TABLET | Freq: Every day | ORAL | Status: DC
  Administered 2015-04-12 – 2015-04-13 (×2): 100 mg via ORAL
  Filled 2015-04-11 (×2): qty 1

## 2015-04-11 MED ORDER — BUPIVACAINE HCL (PF) 0.25 % IJ SOLN
INTRAMUSCULAR | Status: AC
  Filled 2015-04-11: qty 30

## 2015-04-11 MED ORDER — BUPIVACAINE HCL 0.25 % IJ SOLN
INTRAMUSCULAR | Status: DC | PRN
  Administered 2015-04-11: 20 mL

## 2015-04-11 SURGICAL SUPPLY — 64 items
BAG SPEC THK2 15X12 ZIP CLS (MISCELLANEOUS) ×1
BAG ZIPLOCK 12X15 (MISCELLANEOUS) ×3 IMPLANT
BANDAGE ELASTIC 6 VELCRO ST LF (GAUZE/BANDAGES/DRESSINGS) ×3 IMPLANT
BANDAGE ESMARK 6X9 LF (GAUZE/BANDAGES/DRESSINGS) ×1 IMPLANT
BLADE SAG 18X100X1.27 (BLADE) ×3 IMPLANT
BLADE SAW SGTL 11.0X1.19X90.0M (BLADE) ×3 IMPLANT
BNDG CMPR 9X6 STRL LF SNTH (GAUZE/BANDAGES/DRESSINGS) ×1
BNDG ESMARK 6X9 LF (GAUZE/BANDAGES/DRESSINGS) ×3
BOWL SMART MIX CTS (DISPOSABLE) ×3 IMPLANT
CAP KNEE TOTAL 3 SIGMA ×2 IMPLANT
CEMENT HV SMART SET (Cement) ×6 IMPLANT
CLOSURE WOUND 1/2 X4 (GAUZE/BANDAGES/DRESSINGS) ×2
CUFF TOURN SGL QUICK 34 (TOURNIQUET CUFF) ×3
CUFF TRNQT CYL 34X4X40X1 (TOURNIQUET CUFF) ×1 IMPLANT
DECANTER SPIKE VIAL GLASS SM (MISCELLANEOUS) ×3 IMPLANT
DRAPE EXTREMITY T 121X128X90 (DRAPE) ×3 IMPLANT
DRAPE POUCH INSTRU U-SHP 10X18 (DRAPES) ×3 IMPLANT
DRAPE U-SHAPE 47X51 STRL (DRAPES) ×3 IMPLANT
DRSG ADAPTIC 3X8 NADH LF (GAUZE/BANDAGES/DRESSINGS) ×3 IMPLANT
DRSG PAD ABDOMINAL 8X10 ST (GAUZE/BANDAGES/DRESSINGS) ×3 IMPLANT
DURAPREP 26ML APPLICATOR (WOUND CARE) ×3 IMPLANT
ELECT REM PT RETURN 9FT ADLT (ELECTROSURGICAL) ×3
ELECTRODE REM PT RTRN 9FT ADLT (ELECTROSURGICAL) ×1 IMPLANT
EVACUATOR 1/8 PVC DRAIN (DRAIN) ×3 IMPLANT
FACESHIELD WRAPAROUND (MASK) ×12 IMPLANT
FACESHIELD WRAPAROUND OR TEAM (MASK) ×5 IMPLANT
GAUZE SPONGE 4X4 12PLY STRL (GAUZE/BANDAGES/DRESSINGS) ×3 IMPLANT
GLOVE BIO SURGEON STRL SZ8 (GLOVE) ×3 IMPLANT
GLOVE BIOGEL PI IND STRL 6.5 (GLOVE) IMPLANT
GLOVE BIOGEL PI IND STRL 8 (GLOVE) ×1 IMPLANT
GLOVE BIOGEL PI INDICATOR 6.5 (GLOVE) ×6
GLOVE BIOGEL PI INDICATOR 8 (GLOVE) ×2
GLOVE SURG SS PI 6.0 STRL IVOR (GLOVE) ×2 IMPLANT
GLOVE SURG SS PI 6.5 STRL IVOR (GLOVE) ×4 IMPLANT
GOWN BRE IMP SLV SIRUS LXLNG (GOWN DISPOSABLE) ×2 IMPLANT
GOWN STRL REUS W/TWL LRG LVL3 (GOWN DISPOSABLE) ×7 IMPLANT
HANDPIECE INTERPULSE COAX TIP (DISPOSABLE) ×3
IMMOBILIZER KNEE 20 (SOFTGOODS) ×3
IMMOBILIZER KNEE 20 THIGH 36 (SOFTGOODS) ×1 IMPLANT
KIT BASIN OR (CUSTOM PROCEDURE TRAY) ×3 IMPLANT
MANIFOLD NEPTUNE II (INSTRUMENTS) ×3 IMPLANT
NDL SAFETY ECLIPSE 18X1.5 (NEEDLE) ×2 IMPLANT
NEEDLE HYPO 18GX1.5 SHARP (NEEDLE) ×6
NS IRRIG 1000ML POUR BTL (IV SOLUTION) ×3 IMPLANT
PACK TOTAL JOINT (CUSTOM PROCEDURE TRAY) ×3 IMPLANT
PADDING CAST COTTON 6X4 STRL (CAST SUPPLIES) ×9 IMPLANT
PEN SKIN MARKING BROAD (MISCELLANEOUS) ×3 IMPLANT
POSITIONER SURGICAL ARM (MISCELLANEOUS) ×3 IMPLANT
SET HNDPC FAN SPRY TIP SCT (DISPOSABLE) ×1 IMPLANT
STRIP CLOSURE SKIN 1/2X4 (GAUZE/BANDAGES/DRESSINGS) ×4 IMPLANT
SUCTION FRAZIER 12FR DISP (SUCTIONS) ×3 IMPLANT
SUT MNCRL AB 4-0 PS2 18 (SUTURE) ×3 IMPLANT
SUT VIC AB 2-0 CT1 27 (SUTURE) ×12
SUT VIC AB 2-0 CT1 TAPERPNT 27 (SUTURE) ×3 IMPLANT
SUT VLOC 180 0 24IN GS25 (SUTURE) ×3 IMPLANT
SYR 20CC LL (SYRINGE) ×3 IMPLANT
SYR 50ML LL SCALE MARK (SYRINGE) ×3 IMPLANT
TOWEL OR 17X26 10 PK STRL BLUE (TOWEL DISPOSABLE) ×3 IMPLANT
TOWEL OR NON WOVEN STRL DISP B (DISPOSABLE) ×2 IMPLANT
TRAY FOLEY W/METER SILVER 14FR (SET/KITS/TRAYS/PACK) ×3 IMPLANT
TRAY REVISION SZ 2.5 (Knees) ×2 IMPLANT
WATER STERILE IRR 1500ML POUR (IV SOLUTION) ×3 IMPLANT
WRAP KNEE MAXI GEL POST OP (GAUZE/BANDAGES/DRESSINGS) ×3 IMPLANT
YANKAUER SUCT BULB TIP 10FT TU (MISCELLANEOUS) ×3 IMPLANT

## 2015-04-11 NOTE — Anesthesia Procedure Notes (Addendum)
Spinal Patient location during procedure: OR Start time: 04/11/2015 9:00 AM End time: 04/11/2015 9:07 AM Staffing Anesthesiologist: Lillia Abed Performed by: anesthesiologist  Preanesthetic Checklist Completed: patient identified, site marked, surgical consent, pre-op evaluation, timeout performed, IV checked, risks and benefits discussed and monitors and equipment checked Spinal Block Patient position: sitting Prep: Betadine Patient monitoring: heart rate, cardiac monitor, continuous pulse ox and blood pressure Approach: right paramedian Location: L3-4 Injection technique: single-shot Needle Needle type: Sprotte  Needle gauge: 24 G Needle length: 9 cm Needle insertion depth: 6 cm Assessment Sensory level: T8  Procedure Name: MAC Date/Time: 04/11/2015 9:06 AM Performed by: Dione Booze Pre-anesthesia Checklist: Patient identified, Emergency Drugs available, Suction available and Patient being monitored Patient Re-evaluated:Patient Re-evaluated prior to inductionOxygen Delivery Method: Simple face mask Placement Confirmation: positive ETCO2

## 2015-04-11 NOTE — Anesthesia Postprocedure Evaluation (Signed)
Anesthesia Post Note  Patient: Kiara Brown  Procedure(s) Performed: Procedure(s) (LRB): RIGHT TOTAL KNEE ARTHROPLASTY (Right)  Anesthesia type: spinal  Patient location: PACU  Post pain: Pain level controlled  Post assessment: Patient's Cardiovascular Status Stable  Last Vitals:  Filed Vitals:   04/11/15 1215  BP: 175/59  Pulse: 58  Temp: 36.3 C  Resp: 14    Post vital signs: Reviewed and stable  Level of consciousness: awake  Complications: No apparent anesthesia complications

## 2015-04-11 NOTE — Progress Notes (Signed)
PT Cancellation Note  Patient Details Name: Kiara Brown MRN: [id number] DOB: 03/06/1941   Cancelled Treatment:    Reason Eval/Treat Not Completed: Patient declined, no reason specified ("maybe tomorrow, not today")   [facility] 04/11/2015, 2:18 PM

## 2015-04-11 NOTE — Anesthesia Preprocedure Evaluation (Signed)
Anesthesia Evaluation  Patient identified by MRN, date of birth, ID band Patient awake    Reviewed: Allergy & Precautions, NPO status , Patient's Chart, lab work & pertinent test results  History of Anesthesia Complications (+) PONV  Airway Mallampati: I  TM Distance: >3 FB Neck ROM: Full    Dental   Pulmonary    Pulmonary exam normal       Cardiovascular Normal cardiovascular exam    Neuro/Psych    GI/Hepatic GERD-  Medicated and Controlled,  Endo/Other    Renal/GU      Musculoskeletal  (+) Arthritis -, Rheumatoid disorders,    Abdominal   Peds  Hematology   Anesthesia Other Findings   Reproductive/Obstetrics                             Anesthesia Physical Anesthesia Plan  ASA: II  Anesthesia Plan: Spinal   Post-op Pain Management:    Induction: Intravenous  Airway Management Planned: Natural Airway  Additional Equipment:   Intra-op Plan:   Post-operative Plan:   Informed Consent: I have reviewed the patients History and Physical, chart, labs and discussed the procedure including the risks, benefits and alternatives for the proposed anesthesia with the patient or authorized representative who has indicated his/her understanding and acceptance.     Plan Discussed with: CRNA and Surgeon  Anesthesia Plan Comments:         Anesthesia Quick Evaluation

## 2015-04-11 NOTE — Op Note (Signed)
Pre-operative diagnosis- Osteoarthritis  Right knee(s)  Post-operative diagnosis- Osteoarthritis Right knee(s)  Procedure-  Right  Total Knee Arthroplasty  Surgeon- Dione Plover. Brice Kossman, MD  Assistant- Ardeen Jourdain, PA-C   Anesthesia-  Spinal  EBL-* No blood loss amount entered *   Drains Hemovac  Tourniquet time-  Total Tourniquet Time Documented: Thigh (Right) - 44 minutes Total: Thigh (Right) - 44 minutes     Complications- None  Condition-PACU - hemodynamically stable.   Brief Clinical Note  Kiara Brown is a 74 y.o. year old female with end stage OA of her right knee with progressively worsening pain and dysfunction. She has constant pain, with activity and at rest and significant functional deficits with difficulties even with ADLs. She has had extensive non-op management including analgesics, injections of cortisone and viscosupplements, and home exercise program, but remains in significant pain with significant dysfunction.Radiographs show bone on bone arthritis  Medial and patellofemoral. She presents now for right Total Knee Arthroplasty.    Procedure in detail---   The patient is brought into the operating room and positioned supine on the operating table. After successful administration of  Spinal,   a tourniquet is placed high on the  Right thigh(s) and the lower extremity is prepped and draped in the usual sterile fashion. Time out is performed by the operating team and then the  Right lower extremity is wrapped in Esmarch, knee flexed and the tourniquet inflated to 300 mmHg.       A midline incision is made with a ten blade through the subcutaneous tissue to the level of the extensor mechanism. A fresh blade is used to make a medial parapatellar arthrotomy. Soft tissue over the proximal medial tibia is subperiosteally elevated to the joint line with a knife and into the semimembranosus bursa with a Cobb elevator. Soft tissue over the proximal lateral tibia is elevated  with attention being paid to avoiding the patellar tendon on the tibial tubercle. The patella is everted, knee flexed 90 degrees and the ACL and PCL are removed. Findings are bone on bone medial and patellofemoral with large global osteophytes.        The drill is used to create a starting hole in the distal femur and the canal is thoroughly irrigated with sterile saline to remove the fatty contents. The 5 degree Right  valgus alignment guide is placed into the femoral canal and the distal femoral cutting block is pinned to remove 10 mm off the distal femur. Resection is made with an oscillating saw.      The tibia is subluxed forward and the menisci are removed. The extramedullary alignment guide is placed referencing proximally at the medial aspect of the tibial tubercle and distally along the second metatarsal axis and tibial crest. The block is pinned to remove 12mm off the more deficient medial  side. Resection is made with an oscillating saw. Size 2.5is the most appropriate size for the tibia and the proximal tibia is prepared with the modular drill and keel punch for that size. I prepared for an MBT revision tibia given her morbid obesity      The femoral sizing guide is placed and size 3 is most appropriate. Rotation is marked off the epicondylar axis and confirmed by creating a rectangular flexion gap at 90 degrees. The size 3 cutting block is pinned in this rotation and the anterior, posterior and chamfer cuts are made with the oscillating saw. The intercondylar block is then placed and that cut is  made.      Trial size 2.5 tibial component, trial size 3 posterior stabilized femur and a 10  mm posterior stabilized rotating platform insert trial is placed. Full extension is achieved with excellent varus/valgus and anterior/posterior balance throughout full range of motion. The patella is everted and thickness measured to be 22  mm. Free hand resection is taken to 12 mm, a 35 template is placed, lug holes  are drilled, trial patella is placed, and it tracks normally. Osteophytes are removed off the posterior femur with the trial in place. All trials are removed and the cut bone surfaces prepared with pulsatile lavage. Cement is mixed and once ready for implantation, the size 2.5 tibial implant, size  3 posterior stabilized femoral component, and the size 35 patella are cemented in place and the patella is held with the clamp. The trial insert is placed and the knee held in full extension. The Exparel (20 ml mixed with 30 ml saline) and .25% Bupivicaine, are injected into the extensor mechanism, posterior capsule, medial and lateral gutters and subcutaneous tissues.  All extruded cement is removed and once the cement is hard the permanent 12.5 mm posterior stabilized rotating platform insert is placed into the tibial tray.      The wound is copiously irrigated with saline solution and the extensor mechanism closed over a hemovac drain with #1 V-loc suture. The tourniquet is released for a total tourniquet time of 44  minutes. Flexion against gravity is 125 degrees and the patella tracks normally. Subcutaneous tissue is closed with 2.0 vicryl and subcuticular with running 4.0 Monocryl. The incision is cleaned and dried and steri-strips and a bulky sterile dressing are applied. The limb is placed into a knee immobilizer and the patient is awakened and transported to recovery in stable condition.      Please note that a surgical assistant was a medical necessity for this procedure in order to perform it in a safe and expeditious manner. Surgical assistant was necessary to retract the ligaments and vital neurovascular structures to prevent injury to them and also necessary for proper positioning of the limb to allow for anatomic placement of the prosthesis.   Dione Plover Olman Yono, MD    04/11/2015, 10:13 AM

## 2015-04-11 NOTE — Transfer of Care (Signed)
Immediate Anesthesia Transfer of Care Note  Patient: Kiara Brown  Procedure(s) Performed: Procedure(s): RIGHT TOTAL KNEE ARTHROPLASTY (Right)  Patient Location: PACU  Anesthesia Type:Spinal  Level of Consciousness: awake, alert  and oriented  Airway & Oxygen Therapy: Patient Spontanous Breathing and Patient connected to face mask oxygen  Post-op Assessment: Report given to RN and Post -op Vital signs reviewed and stable  Post vital signs: Reviewed and stable  Last Vitals:  Filed Vitals:   04/11/15 0655  BP: 154/58  Pulse: 72  Temp: 36.6 C  Resp: 16    Complications: No apparent anesthesia complications

## 2015-04-11 NOTE — Interval H&P Note (Signed)
History and Physical Interval Note:  04/11/2015 6:59 AM  Kiara Brown  has presented today for surgery, with the diagnosis of right knee osteoarthritis  The various methods of treatment have been discussed with the patient and family. After consideration of risks, benefits and other options for treatment, the patient has consented to  Procedure(s): RIGHT TOTAL KNEE ARTHROPLASTY (Right) as a surgical intervention .  The patient's history has been reviewed, patient examined, no change in status, stable for surgery.  I have reviewed the patient's chart and labs.  Questions were answered to the patient's satisfaction.     Gearlean Alf

## 2015-04-12 ENCOUNTER — Encounter (HOSPITAL_COMMUNITY): Payer: Self-pay | Admitting: Orthopedic Surgery

## 2015-04-12 LAB — BASIC METABOLIC PANEL
Anion gap: 9 (ref 5–15)
BUN: 13 mg/dL (ref 6–20)
CHLORIDE: 105 mmol/L (ref 101–111)
CO2: 24 mmol/L (ref 22–32)
CREATININE: 0.8 mg/dL (ref 0.44–1.00)
Calcium: 8.3 mg/dL — ABNORMAL LOW (ref 8.9–10.3)
GFR calc non Af Amer: >60 mL/min (ref >60)
Glucose, Bld: 137 mg/dL — ABNORMAL HIGH (ref 65–99)
Potassium: 4 mmol/L (ref 3.5–5.1)
Sodium: 138 mmol/L (ref 135–145)

## 2015-04-12 LAB — CBC
HCT: 33.3 % — ABNORMAL LOW (ref 36.0–46.0)
HEMOGLOBIN: 10.7 g/dL — AB (ref 12.0–15.0)
MCH: 29.6 pg (ref 26.0–34.0)
MCHC: 32.1 g/dL (ref 30.0–36.0)
MCV: 92 fL (ref 78.0–100.0)
Platelets: 276 10*3/uL (ref 150–400)
RBC: 3.62 MIL/uL — AB (ref 3.87–5.11)
RDW: 14.4 % (ref 11.5–15.5)
WBC: 13.5 10*3/uL — AB (ref 4.0–10.5)

## 2015-04-12 MED ORDER — OMEPRAZOLE 20 MG PO CPDR
40.0000 mg | DELAYED_RELEASE_CAPSULE | Freq: Every day | ORAL | Status: DC
  Administered 2015-04-12 – 2015-04-13 (×2): 40 mg via ORAL
  Filled 2015-04-12 (×2): qty 2

## 2015-04-12 MED ORDER — POLYETHYLENE GLYCOL 3350 17 G PO PACK: 17.0000 g | PACK | Freq: Every day | ORAL | Status: DC | PRN

## 2015-04-12 MED ORDER — TRAMADOL HCL 50 MG PO TABS: 50.0000 mg | ORAL_TABLET | Freq: Four times a day (QID) | ORAL | Status: DC | PRN

## 2015-04-12 MED ORDER — METHOCARBAMOL 500 MG PO TABS: 500.0000 mg | ORAL_TABLET | Freq: Four times a day (QID) | ORAL | Status: DC | PRN

## 2015-04-12 MED ORDER — ONDANSETRON HCL 4 MG PO TABS: 4.0000 mg | ORAL_TABLET | Freq: Four times a day (QID) | ORAL | Status: DC | PRN

## 2015-04-12 MED ORDER — NON FORMULARY: 40.0000 mg | Freq: Every day | Status: DC

## 2015-04-12 MED ORDER — METOCLOPRAMIDE HCL 5 MG PO TABS: 5.0000 mg | ORAL_TABLET | Freq: Three times a day (TID) | ORAL | Status: DC | PRN

## 2015-04-12 MED ORDER — OXYCODONE HCL 5 MG PO TABS: 5.0000 mg | ORAL_TABLET | ORAL | Status: DC | PRN

## 2015-04-12 MED ORDER — RIVAROXABAN 10 MG PO TABS: 10.0000 mg | ORAL_TABLET | Freq: Every day | ORAL | Status: DC

## 2015-04-12 MED ORDER — DOCUSATE SODIUM 100 MG PO CAPS: 100.0000 mg | ORAL_CAPSULE | Freq: Two times a day (BID) | ORAL | Status: DC

## 2015-04-12 MED ORDER — BISACODYL 10 MG RE SUPP: 10.0000 mg | Freq: Every day | RECTAL | Status: DC | PRN

## 2015-04-12 NOTE — Clinical Social Work Note (Signed)
Clinical Social Work Assessment  Patient Details  Name: Kiara Brown MRN: [id number] Date of Birth: Dec 13, 1940  Date of referral:  04/12/15               Reason for consult:  Facility Placement, Discharge Planning                Permission sought to share information with:    Permission granted to share information::     Name::        Agency::     Relationship::     Contact Information:     Housing/Transportation Living arrangements for the past 2 months:  Single Family Home Source of Information:  Patient Patient Interpreter Needed:  None Criminal Activity/Legal Involvement Pertinent to Current Situation/Hospitalization:  Yes Significant Relationships:  Spouse Lives with:  Spouse Do you feel safe going back to the place where you live?   (SNF placement) Need for family participation in patient care:  No (Coment)  Care giving concerns:  No concerns at this time.   Social Worker assessment / plan:  Planned admission for Right Total knee replacement. Pt has made prior arrangements to have [place] [facility] place following hospital d/c. D/c plan has been confirmed.  Employment status:  Retired Forensic scientist:  Medicare PT Recommendations:  [place] / Referral to community resources:   (Insurance coverage for SNF and ambulance transport reviewed.)  Patient/Family's Response to care:  PT has recommended [facility] placement. Pt agrees with this plan.  Patient/Family's Understanding of and Emotional Response to Diagnosis, Current Treatment, and Prognosis:  Pt is motivated to work with therapy and is looking forward to having rehab at [facility] placement.  Emotional Assessment Appearance:  Appears stated age Attitude/Demeanor/Rapport:   (cooperative) Affect (typically observed):  Pleasant, Calm Orientation:  Oriented to Self, Oriented to Place, Oriented to  Time Alcohol / Substance use:  Not Applicable Psych involvement (Current and /or in the  community):  No (Comment)  Discharge Needs  Concerns to be addressed:  Discharge Planning Concerns Readmission within the last 30 days:  No Current discharge risk:  None Barriers to Discharge:  No Barriers Identified   Akua Blethen, Randall An, LCSW 04/12/2015, 4:26 PM

## 2015-04-12 NOTE — Discharge Instructions (Signed)
Dr. Gaynelle Arabian Total Joint Specialist [facility] [address] [place], [place] [address] [phone]  TOTAL KNEE REPLACEMENT POSTOPERATIVE DIRECTIONS  Knee Rehabilitation, Guidelines Following Surgery  Results after knee surgery are often greatly improved when you follow the exercise, range of motion and muscle strengthening exercises prescribed by your doctor. Safety measures are also important to protect the knee from further injury. Any time any of these exercises cause you to have increased pain or swelling in your knee joint, decrease the amount until you are comfortable again and slowly increase them. If you have problems or questions, call your caregiver or physical therapist for advice.   HOME CARE INSTRUCTIONS  Remove items at home which could result in a fall. This includes throw rugs or furniture in walking pathways.   ICE to the affected knee every three hours for 30 minutes at a time and then as needed for pain and swelling.  Continue to use ice on the knee for pain and swelling from surgery. You may notice swelling that will progress down to the foot and ankle.  This is normal after surgery.  Elevate the leg when you are not up walking on it.    Continue to use the breathing machine which will help keep your temperature down.  It is common for your temperature to cycle up and down following surgery, especially at night when you are not up moving around and exerting yourself.  The breathing machine keeps your lungs expanded and your temperature down.  Do not place pillow under knee, focus on keeping the knee straight while resting  DIET You may resume your previous home diet once your are discharged from the hospital.  DRESSING / WOUND CARE / SHOWERING You may change your dressing 3-5 days after surgery.  Then change the dressing every day with sterile gauze.  Please use good hand washing techniques before changing the dressing.  Do not use any  lotions or creams on the incision until instructed by your surgeon. and You may shower 3 days after surgery, but keep the wounds dry during showering.  You may use an occlusive plastic wrap (Press'n Seal for example), NO SOAKING/SUBMERGING IN THE BATHTUB.  If the bandage gets wet, change with a clean dry gauze.  If the incision gets wet, pat the wound dry with a clean towel. You may start showering once you are discharged home but do not submerge the incision under water. Just pat the incision dry and apply a dry gauze dressing on daily. Change the surgical dressing daily and reapply a dry dressing each time.  ACTIVITY Walk with your walker as instructed. Use walker as long as suggested by your caregivers. Avoid periods of inactivity such as sitting longer than an hour when not asleep. This helps prevent blood clots.  You may resume a sexual relationship in one month or when given the OK by your doctor.  You may return to work once you are cleared by your doctor.  Do not drive a car for 6 weeks or until released by you surgeon.  Do not drive while taking narcotics.  WEIGHT BEARING Weight bearing as tolerated with assist device (walker, cane, etc) as directed, use it as long as suggested by your surgeon or therapist, typically at least 4-6 weeks.  POSTOPERATIVE CONSTIPATION PROTOCOL Constipation - defined medically as fewer than three stools per week and severe constipation as less than one stool per week.  One of the most common issues patients have  following surgery is constipation.  Even if you have a regular bowel pattern at home, your normal regimen is likely to be disrupted due to multiple reasons following surgery.  Combination of anesthesia, postoperative narcotics, change in appetite and fluid intake all can affect your bowels.  In order to avoid complications following surgery, here are some recommendations in order to help you during your recovery period.  Colace (docusate) - Pick up  an over-the-counter form of Colace or another stool softener and take twice a day as long as you are requiring postoperative pain medications.  Take with a full glass of water daily.  If you experience loose stools or diarrhea, hold the colace until you stool forms back up.  If your symptoms do not get better within 1 week or if they get worse, check with your doctor.  Dulcolax (bisacodyl) - Pick up over-the-counter and take as directed by the product packaging as needed to assist with the movement of your bowels.  Take with a full glass of water.  Use this product as needed if not relieved by Colace only.   MiraLax (polyethylene glycol) - Pick up over-the-counter to have on hand.  MiraLax is a solution that will increase the amount of water in your bowels to assist with bowel movements.  Take as directed and can mix with a glass of water, juice, soda, coffee, or tea.  Take if you go more than two days without a movement. Do not use MiraLax more than once per day. Call your doctor if you are still constipated or irregular after using this medication for 7 days in a row.  If you continue to have problems with postoperative constipation, please contact the office for further assistance and recommendations.  If you experience "the worst abdominal pain ever" or develop nausea or vomiting, please contact the office immediatly for further recommendations for treatment.  ITCHING  If you experience itching with your medications, try taking only a single pain pill, or even half a pain pill at a time.  You can also use Benadryl over the counter for itching or also to help with sleep.   TED HOSE STOCKINGS Wear the elastic stockings on both legs for three weeks following surgery during the day but you may remove then at night for sleeping.  MEDICATIONS See your medication summary on the After Visit Summary that the nursing staff will review with you prior to discharge.  You may have some home medications which  will be placed on hold until you complete the course of blood thinner medication.  It is important for you to complete the blood thinner medication as prescribed by your surgeon.  Continue your approved medications as instructed at time of discharge.  PRECAUTIONS If you experience chest pain or shortness of breath - call 911 immediately for transfer to the hospital emergency department.  If you develop a fever greater that 101 F, purulent drainage from wound, increased redness or drainage from wound, foul odor from the wound/dressing, or calf pain - CONTACT YOUR SURGEON.                                                   FOLLOW-UP APPOINTMENTS Make sure you keep all of your appointments after your operation with your surgeon and caregivers. You should call the office at the above phone number and  make an appointment for approximately two weeks after the date of your surgery or on the date instructed by your surgeon outlined in the "After Visit Summary".   RANGE OF MOTION AND STRENGTHENING EXERCISES  Rehabilitation of the knee is important following a knee injury or an operation. After just a few days of immobilization, the muscles of the thigh which control the knee become weakened and shrink (atrophy). Knee exercises are designed to build up the tone and strength of the thigh muscles and to improve knee motion. Often times heat used for twenty to thirty minutes before working out will loosen up your tissues and help with improving the range of motion but do not use heat for the first two weeks following surgery. These exercises can be done on a training (exercise) mat, on the floor, on a table or on a bed. Use what ever works the best and is most comfortable for you Knee exercises include:  Leg Lifts - While your knee is still immobilized in a splint or cast, you can do straight leg raises. Lift the leg to 60 degrees, hold for 3 sec, and slowly lower the leg. Repeat 10-20 times 2-3 times daily. Perform  this exercise against resistance later as your knee gets better.  Quad and Hamstring Sets - Tighten up the muscle on the front of the thigh (Quad) and hold for 5-10 sec. Repeat this 10-20 times hourly. Hamstring sets are done by pushing the foot backward against an object and holding for 5-10 sec. Repeat as with quad sets.   Leg Slides: Lying on your back, slowly slide your foot toward your buttocks, bending your knee up off the floor (only go as far as is comfortable). Then slowly slide your foot back down until your leg is flat on the floor again.  Angel Wings: Lying on your back spread your legs to the side as far apart as you can without causing discomfort.  A rehabilitation program following serious knee injuries can speed recovery and prevent re-injury in the future due to weakened muscles. Contact your doctor or a physical therapist for more information on knee rehabilitation.   IF YOU ARE TRANSFERRED TO A SKILLED REHAB FACILITY If the patient is transferred to a skilled rehab facility following release from the hospital, a list of the current medications will be sent to the facility for the patient to continue.  When discharged from the skilled rehab facility, please have the facility set up the patient's [place] prior to being released. Also, the skilled facility will be responsible for providing the patient with their medications at time of release from the facility to include their pain medication, the muscle relaxants, and their blood thinner medication. If the patient is still at the rehab facility at time of the two week follow up appointment, the skilled rehab facility will also need to assist the patient in arranging follow up appointment in our office and any transportation needs.  MAKE SURE YOU:  Understand these instructions.  Get help right away if you are not doing well or get worse.    Pick up stool softner and laxative for home use following surgery while  on pain medications. Do not submerge incision under water. Please use good hand washing techniques while changing dressing each day. May shower starting three days after surgery. Please use a clean towel to pat the incision dry following showers. Continue to use ice for pain and swelling after surgery. Do not use any lotions  or creams on the incision until instructed by your surgeon.  Take Xarelto for two and a half more weeks, then discontinue Xarelto. Once the patient has completed the Xarelto, they may resume the 81 mg Aspirin.   Information on my medicine - XARELTO (Rivaroxaban)  This medication education was reviewed with me or my healthcare representative as part of my discharge preparation.  The pharmacist that spoke with me during my hospital stay was:  [facility]  Why was Xarelto prescribed for you? Xarelto was prescribed for you to reduce the risk of blood clots forming after orthopedic surgery. The medical term for these abnormal blood clots is venous thromboembolism (VTE).  What do you need to know about xarelto ? Take your Xarelto ONCE DAILY at the same time every day. You may take it either with or without food.  If you have difficulty swallowing the tablet whole, you may crush it and mix in applesauce just prior to taking your dose.  Take Xarelto exactly as prescribed by your doctor and DO NOT stop taking Xarelto without talking to the doctor who prescribed the medication.  Stopping without other VTE prevention medication to take the place of Xarelto may increase your risk of developing a clot.  After discharge, you should have regular check-up appointments with your healthcare provider that is prescribing your Xarelto.    What do you do if you miss a dose? If you miss a dose, take it as soon as you remember on the same day then continue your regularly scheduled once daily regimen the next day. Do not take two doses of Xarelto on the same day.    Important Safety Information A possible side effect of Xarelto is bleeding. You should call your healthcare provider right away if you experience any of the following: ? Bleeding from an injury or your nose that does not stop. ? Unusual colored urine (red or dark brown) or unusual colored stools (red or black). ? Unusual bruising for unknown reasons. ? A serious fall or if you hit your head (even if there is no bleeding).  Some medicines may interact with Xarelto and might increase your risk of bleeding while on Xarelto. To help avoid this, consult your healthcare provider or pharmacist prior to using any new prescription or non-prescription medications, including herbals, vitamins, non-steroidal anti-inflammatory drugs (NSAIDs) and supplements.  This website has more information on Xarelto: [email or web address]

## 2015-04-12 NOTE — Progress Notes (Signed)
Physical Therapy Treatment Note    04/12/15 1500  PT Visit Information  Last PT Received On 04/12/15  Assistance Needed +1  History of Present Illness Pt is 74 year old female s/p R TKA with hx of L TKA  PT Time Calculation  PT Start Time (ACUTE ONLY) 1421  PT Stop Time (ACUTE ONLY) 1439  PT Time Calculation (min) (ACUTE ONLY) 18 min  Subjective Data  Subjective Pt ambulated in hallway and then assisted back to bed  Precautions  Precautions Knee  Required Braces or Orthoses Knee Immobilizer - Right  Knee Immobilizer - Right Discontinue once straight leg raise with < 10 degree lag  Restrictions  Other Position/Activity Restrictions WBAT  Pain Assessment  Pain Assessment 0-10  Pain Score 5  Pain Location R knee  Pain Descriptors / Indicators Aching;Sore  Pain Intervention(s) Monitored during session;RN gave pain meds during session;Limited activity within patient's tolerance;Repositioned  Cognition  Arousal/Alertness Awake/alert  Behavior During Therapy WFL for tasks assessed/performed  Overall Cognitive Status Within Functional Limits for tasks assessed  Bed Mobility  Overal bed mobility Needs Assistance  Bed Mobility Sit to Supine  Sit to supine Min assist  General bed mobility comments verbal cues for technique, assist for R LE  Transfers  Overall transfer level Needs assistance  Equipment used Rolling walker (2 wheeled)  Transfers Sit to/from Stand  Sit to Stand Min guard  General transfer comment verbal cues for safe technique  Ambulation/Gait  Ambulation/Gait assistance Min guard  Ambulation Distance (Feet) 80 Feet  Assistive device Rolling walker (2 wheeled)  General Gait Details verbal cues for sequence, RW distance, step length, WBing through RW for pain control, recliner following  Gait Pattern/deviations Step-to pattern;Antalgic  Gait velocity decr  PT - End of Session  Equipment Utilized During Treatment Right knee immobilizer  Activity Tolerance Patient  tolerated treatment well  Patient left in bed;with call bell/phone within reach;with family/visitor present  PT - Assessment/Plan  PT Plan Current plan remains appropriate  PT Frequency (ACUTE ONLY) 7X/week  Follow Up Recommendations SNF  PT equipment None recommended by PT  PT Goal Progression  Progress towards PT goals Progressing toward goals  PT General Charges  $$ ACUTE PT VISIT 1 Procedure  PT Treatments  $Gait Training 8-22 mins   Carmelia Bake, PT, DPT 04/12/2015 Pager: [phone]

## 2015-04-12 NOTE — Progress Notes (Signed)
   Subjective: 1 Day Post-Op Procedure(s) (LRB): RIGHT TOTAL KNEE ARTHROPLASTY (Right) Patient reports pain as mild.   [name]. Doing okay this morning. Patient is well, but has had some minor complaints of pain in the knee, requiring pain medications We will start therapy today.  Plan is to go Skilled nursing facility after hospital stay.  Objective: Vital signs in last 24 hours: Temp:  [97.3 F (36.3 C)-98 F (36.7 C)] 97.5 F (36.4 C) (05/17 0645) Pulse Rate:  [52-68] 61 (05/17 0645) Resp:  [14-18] 16 (05/17 0645) BP: (121-175)/(42-67) 125/55 mmHg (05/17 0645) SpO2:  [93 %-100 %] 96 % (05/17 0645) Weight:  [111.13 kg (245 lb)] 111.13 kg (245 lb) (05/16 1237)  Intake/Output from previous day:  Intake/Output Summary (Last 24 hours) at 04/12/15 0901 Last data filed at 04/12/15 0645  Gross per 24 hour  Intake   4790 ml  Output   4640 ml  Net    150 ml    Intake/Output this shift: UOP 1300 since around [place]  Labs:  Recent Labs  04/12/15 0530  HGB 10.7*    Recent Labs  04/12/15 0530  WBC 13.5*  RBC 3.62*  HCT 33.3*  PLT 276    Recent Labs  04/12/15 0530  NA 138  K 4.0  CL 105  CO2 24  BUN 13  CREATININE 0.80  GLUCOSE 137*  CALCIUM 8.3*   No results for input(s): LABPT, INR in the last 72 hours.  EXAM General - Patient is Alert, Appropriate and Oriented Extremity - Neurovascular intact Sensation intact distally Dorsiflexion/Plantar flexion intact Dressing - dressing C/D/I Motor Function - intact, moving foot and toes well on exam.  Hemovac pulled without difficulty.  Past Medical History  Diagnosis Date  . Diverticulosis   . Hyperlipidemia   . H/O hiatal hernia   . Dysrhythmia     hx rapid heartbeat, no cardiologist  . GERD (gastroesophageal reflux disease)   . Fibromyalgia     peripheral neuropathy  . Sciatica   . Fibromyalgia   . PONV (postoperative nausea and vomiting)     "THROAT WITH EXTREME  [id number]"9323, ulnar nerve surgery  . Arthritis     osteo, rheumatoid  . UTI (lower urinary tract infection)   . Shortness of breath dyspnea     with exertion    Assessment/Plan: 1 Day Post-Op Procedure(s) (LRB): RIGHT TOTAL KNEE ARTHROPLASTY (Right) Active Problems:   OA (osteoarthritis) of knee  Estimated body mass index is 43.41 kg/(m^2) as calculated from the following:   Height as of this encounter: 5' 2.99" (1.6 m).   Weight as of this encounter: 111.13 kg (245 lb). Advance diet Up with therapy Discharge to SNF when bed ready and patient ready  DVT Prophylaxis - Xarelto Weight-Bearing as tolerated to right leg D/C O2 and Pulse OX and try on Room Air  Arlee Muslim, PA-C Orthopaedic Surgery 04/12/2015, 9:01 AM

## 2015-04-12 NOTE — Clinical Social Work Placement (Signed)
   CLINICAL SOCIAL WORK PLACEMENT  NOTE  Date:  04/12/2015  Patient Details  Name: Kiara Brown MRN: [id number] Date of Birth: 07-14-1941  Clinical Social Work is seeking post-discharge placement for this patient at the [place] level of care (*CSW will initial, date and re-position this form in  chart as items are completed):  No   Patient/family provided with [place] Work Department's list of facilities offering this level of care within the geographic area requested by the patient (or if unable, by the patient's family).  Yes   Patient/family informed of their freedom to choose among providers that offer the needed level of care, that participate in Medicare, Medicaid or managed care program needed by the patient, have an available bed and are willing to accept the patient.  No   Patient/family informed of [place]'s ownership interest in [facility] and [facility], as well as of the fact that they are under no obligation to receive care at these facilities.  PASRR submitted to EDS on       PASRR number received on       Existing PASRR number confirmed on 04/12/15     FL2 transmitted to all facilities in geographic area requested by pt/family on 04/12/15     FL2 transmitted to all facilities within larger geographic area on       Patient informed that his/her managed care company has contracts with or will negotiate with certain facilities, including the following:        Yes   Patient/family informed of bed offers received.  Patient chooses bed at [facility]     Physician recommends and patient chooses bed at      Patient to be transferred to   on  .  Patient to be transferred to facility by       Patient family notified on   of transfer.  Name of family member notified:        PHYSICIAN       Additional Comment:    _______________________________________________ Loraine Maple   [phone] 04/12/2015, 4:32 PM

## 2015-04-12 NOTE — Discharge Summary (Signed)
Physician Discharge Summary   Patient ID: Kiara Brown MRN: [id number] DOB/AGE: 1941/02/19 74 y.o.  Admit date: 04/11/2015 Discharge date: 04/13/2015  Primary Diagnosis:  Osteoarthritis Right knee(s)  Admission Diagnoses:  Past Medical History  Diagnosis Date  . Diverticulosis   . Hyperlipidemia   . H/O hiatal hernia   . Dysrhythmia     hx rapid heartbeat, no cardiologist  . GERD (gastroesophageal reflux disease)   . Fibromyalgia     peripheral neuropathy  . Sciatica   . Fibromyalgia   . PONV (postoperative nausea and vomiting)     "THROAT WITH EXTREME [id number]"6010, ulnar nerve surgery  . Arthritis     osteo, rheumatoid  . UTI (lower urinary tract infection)   . Shortness of breath dyspnea     with exertion   Discharge Diagnoses:   Active Problems:   OA (osteoarthritis) of knee  Estimated body mass index is 43.41 kg/(m^2) as calculated from the following:   Height as of this encounter: 5' 2.99" (1.6 m).   Weight as of this encounter: 111.13 kg (245 lb).  Procedure:  Procedure(s) (LRB): RIGHT TOTAL KNEE ARTHROPLASTY (Right)   Consults: None  HPI: Kiara Brown is a 74 y.o. year old female with end stage OA of her right knee with progressively worsening pain and dysfunction. She has constant pain, with activity and at rest and significant functional deficits with difficulties even with ADLs. She has had extensive non-op management including analgesics, injections of cortisone and viscosupplements, and home exercise program, but remains in significant pain with significant dysfunction.Radiographs show bone on bone arthritis Medial and patellofemoral. She presents now for right Total Knee Arthroplasty.  Laboratory Data: Admission on 04/11/2015  Component Date Value Ref Range Status  . ABO/RH(D) 04/11/2015 A NEG   Final  . Antibody Screen 04/11/2015 POS   Final  . Sample Expiration 04/11/2015 04/14/2015   Final  . Antibody Identification 93/23/5573 ANTI D    Final  . DAT, IgG 04/11/2015 NEG   Final  . Unit Number 04/11/2015 [id number]   Final  . Blood Component Type 04/11/2015 RBC LR PHER2   Final  . Unit division 04/11/2015 00   Final  . Status of Unit 04/11/2015 ALLOCATED   Final  . Transfusion Status 04/11/2015 OK TO TRANSFUSE   Final  . Crossmatch Result 04/11/2015 COMPATIBLE   Final  . Unit Number 04/11/2015 [id number]   Final  . Blood Component Type 04/11/2015 RED CELLS,LR   Final  . Unit division 04/11/2015 00   Final  . Status of Unit 04/11/2015 ALLOCATED   Final  . Transfusion Status 04/11/2015 OK TO TRANSFUSE   Final  . Crossmatch Result 04/11/2015 COMPATIBLE   Final  . WBC 04/12/2015 13.5* 4.0 - 10.5 K/uL Final  . RBC 04/12/2015 3.62* 3.87 - 5.11 MIL/uL Final  . Hemoglobin 04/12/2015 10.7* 12.0 - 15.0 g/dL Final  . HCT 04/12/2015 33.3* 36.0 - 46.0 % Final  . MCV 04/12/2015 92.0  78.0 - 100.0 fL Final  . MCH 04/12/2015 29.6  26.0 - 34.0 pg Final  . MCHC 04/12/2015 32.1  30.0 - 36.0 g/dL Final  . RDW 04/12/2015 14.4  11.5 - 15.5 % Final  . Platelets 04/12/2015 276  150 - 400 K/uL Final  . Sodium 04/12/2015 138  135 - 145 mmol/L Final  . Potassium 04/12/2015 4.0  3.5 - 5.1 mmol/L Final  . Chloride 04/12/2015 105  101 - 111 mmol/L Final  . CO2 04/12/2015 24  22 -  32 mmol/L Final  . Glucose, Bld 04/12/2015 137* 65 - 99 mg/dL Final  . BUN 04/12/2015 13  6 - 20 mg/dL Final  . Creatinine, Ser 04/12/2015 0.80  0.44 - 1.00 mg/dL Final  . Calcium 04/12/2015 8.3* 8.9 - 10.3 mg/dL Final  . GFR calc non Af Amer 04/12/2015 >60  >60 mL/min Final  . GFR calc Af Amer 04/12/2015 >60  >60 mL/min Final   Comment: (NOTE) The eGFR has been calculated using the CKD EPI equation. This calculation has not been validated in all clinical situations. eGFR's persistently <60 mL/min signify possible Chronic Kidney Disease.   . Anion gap 04/12/2015 9  5 - 15 Final  . WBC 04/13/2015 12.3* 4.0 - 10.5 K/uL Final  . RBC 04/13/2015 3.55* 3.87 - 5.11  MIL/uL Final  . Hemoglobin 04/13/2015 10.2* 12.0 - 15.0 g/dL Final  . HCT 04/13/2015 32.7* 36.0 - 46.0 % Final  . MCV 04/13/2015 92.1  78.0 - 100.0 fL Final  . MCH 04/13/2015 28.7  26.0 - 34.0 pg Final  . MCHC 04/13/2015 31.2  30.0 - 36.0 g/dL Final  . RDW 04/13/2015 14.7  11.5 - 15.5 % Final  . Platelets 04/13/2015 245  150 - 400 K/uL Final  . Sodium 04/13/2015 139  135 - 145 mmol/L Final  . Potassium 04/13/2015 4.1  3.5 - 5.1 mmol/L Final  . Chloride 04/13/2015 104  101 - 111 mmol/L Final  . CO2 04/13/2015 27  22 - 32 mmol/L Final  . Glucose, Bld 04/13/2015 114* 65 - 99 mg/dL Final  . BUN 04/13/2015 15  6 - 20 mg/dL Final  . Creatinine, Ser 04/13/2015 0.95  0.44 - 1.00 mg/dL Final  . Calcium 04/13/2015 8.6* 8.9 - 10.3 mg/dL Final  . GFR calc non Af Amer 04/13/2015 58* >60 mL/min Final  . GFR calc Af Amer 04/13/2015 >60  >60 mL/min Final   Comment: (NOTE) The eGFR has been calculated using the CKD EPI equation. This calculation has not been validated in all clinical situations. eGFR's persistently <60 mL/min signify possible Chronic Kidney Disease.   Georgiann Hahn gap 04/13/2015 8  5 - 15 Final  Hospital Outpatient Visit on 04/06/2015  Component Date Value Ref Range Status  . MRSA, PCR 04/06/2015 NEGATIVE  NEGATIVE Final  . Staphylococcus aureus 04/06/2015 NEGATIVE  NEGATIVE Final   Comment:        The Xpert SA Assay (FDA approved for NASAL specimens in patients over 63 years of age), is one component of a comprehensive surveillance program.  Test performance has been validated by [facility] for patients greater than or equal to 8 year old. It is not intended to diagnose infection nor to guide or monitor treatment.   Marland Kitchen aPTT 04/06/2015 35  24 - 37 seconds Final  . WBC 04/06/2015 9.2  4.0 - 10.5 K/uL Final  . RBC 04/06/2015 4.23  3.87 - 5.11 MIL/uL Final  . Hemoglobin 04/06/2015 12.2  12.0 - 15.0 g/dL Final  . HCT 04/06/2015 38.8  36.0 - 46.0 % Final  . MCV 04/06/2015 91.7   78.0 - 100.0 fL Final  . MCH 04/06/2015 28.8  26.0 - 34.0 pg Final  . MCHC 04/06/2015 31.4  30.0 - 36.0 g/dL Final  . RDW 04/06/2015 14.4  11.5 - 15.5 % Final  . Platelets 04/06/2015 310  150 - 400 K/uL Final  . Sodium 04/06/2015 134* 135 - 145 mmol/L Final  . Potassium 04/06/2015 4.0  3.5 - 5.1 mmol/L Final  .  Chloride 04/06/2015 98* 101 - 111 mmol/L Final  . CO2 04/06/2015 27  22 - 32 mmol/L Final  . Glucose, Bld 04/06/2015 98  70 - 99 mg/dL Final  . BUN 04/06/2015 15  6 - 20 mg/dL Final  . Creatinine, Ser 04/06/2015 0.93  0.44 - 1.00 mg/dL Final  . Calcium 04/06/2015 8.9  8.9 - 10.3 mg/dL Final  . Total Protein 04/06/2015 7.0  6.5 - 8.1 g/dL Final  . Albumin 04/06/2015 3.8  3.5 - 5.0 g/dL Final  . AST 04/06/2015 20  15 - 41 U/L Final  . ALT 04/06/2015 16  14 - 54 U/L Final  . Alkaline Phosphatase 04/06/2015 89  38 - 126 U/L Final  . Total Bilirubin 04/06/2015 0.5  0.3 - 1.2 mg/dL Final  . GFR calc non Af Amer 04/06/2015 60* >60 mL/min Final  . GFR calc Af Amer 04/06/2015 >60  >60 mL/min Final   Comment: (NOTE) The eGFR has been calculated using the CKD EPI equation. This calculation has not been validated in all clinical situations. eGFR's persistently <60 mL/min signify possible Chronic Kidney Disease.   . Anion gap 04/06/2015 9  5 - 15 Final  . Prothrombin Time 04/06/2015 12.5  11.6 - 15.2 seconds Final  . INR 04/06/2015 0.92  0.00 - 1.49 Final  . ABO/RH(D) 04/06/2015 A NEG   Final  . Antibody Screen 04/06/2015 POS   Final  . Sample Expiration 04/06/2015 04/10/2015   Final  . Antibody Identification 41/28/[id number] ANTI D   Final  . DAT, IgG 04/06/2015 NEG   Final  . Unit Number 04/06/2015 [id number]   Final  . Blood Component Type 04/06/2015 RED CELLS,LR   Final  . Unit division 04/06/2015 00   Final  . Status of Unit 04/06/2015 REL FROM [facility]   Final  . Transfusion Status 04/06/2015 OK TO TRANSFUSE   Final  . Crossmatch Result 04/06/2015 COMPATIBLE   Final  . Color,  Urine 04/06/2015 YELLOW  YELLOW Final  . APPearance 04/06/2015 CLOUDY* CLEAR Final  . Specific Gravity, Urine 04/06/2015 1.008  1.005 - 1.030 Final  . pH 04/06/2015 6.0  5.0 - 8.0 Final  . Glucose, UA 04/06/2015 NEGATIVE  NEGATIVE mg/dL Final  . Hgb urine dipstick 04/06/2015 SMALL* NEGATIVE Final  . Bilirubin Urine 04/06/2015 NEGATIVE  NEGATIVE Final  . Ketones, ur 04/06/2015 NEGATIVE  NEGATIVE mg/dL Final  . Protein, ur 04/06/2015 NEGATIVE  NEGATIVE mg/dL Final  . Urobilinogen, UA 04/06/2015 0.2  0.0 - 1.0 mg/dL Final  . Nitrite 04/06/2015 NEGATIVE  NEGATIVE Final  . Leukocytes, UA 04/06/2015 MODERATE* NEGATIVE Final  . Squamous Epithelial / LPF 04/06/2015 FEW* RARE Final  . WBC, UA 04/06/2015 7-10  <3 WBC/hpf Final  . RBC / HPF 04/06/2015 0-2  <3 RBC/hpf Final  . Bacteria, UA 04/06/2015 RARE  RARE Final     X-Rays:No results found.  EKG: Orders placed or performed in visit on 04/06/15  . EKG 12-Lead     Hospital Course: Kiara Brown is a 74 y.o. who was admitted to [facility]. They were brought to the operating room on 04/11/2015 and underwent Procedure(s): RIGHT TOTAL KNEE ARTHROPLASTY.  Patient tolerated the procedure well and was later transferred to the recovery room and then to the orthopaedic floor for postoperative care.  They were given PO and IV analgesics for pain control following their surgery.  They were given 24 hours of postoperative antibiotics of  Anti-infectives    Start  Dose/Rate Route Frequency Ordered Stop   04/11/15 1600  ceFAZolin (ANCEF) IVPB 2 g/50 mL premix     2 g 100 mL/hr over 30 Minutes Intravenous Every 6 hours 04/11/15 1234 04/11/15 2134   04/11/15 0650  ceFAZolin (ANCEF) IVPB 2 g/50 mL premix     2 g 100 mL/hr over 30 Minutes Intravenous On call to O.R. 04/11/15 6720 04/11/15 0913     and started on DVT prophylaxis in the form of Xarelto.   PT and OT were ordered for total joint protocol.  Discharge planning consulted to help  with postop disposition and equipment needs. Social worker consulted to assist with placement of the patient. Patient had a decnt night on the evening of surgery and doing fairly well on day one.  They started to get up OOB with therapy on day one. Hemovac drain was pulled without difficulty.  Continued to work with therapy into day two.  Dressing was changed on day two and the incision was healing well.  Patient was seen in rounds and was ready to go to the SNF.   Diet: Regular diet Activity:WBAT Follow-up:in 2 weeks Disposition - [facility] Discharged Condition: good       Discharge Instructions    Call MD / Call 911    Complete by:  As directed   If you experience chest pain or shortness of breath, CALL 911 and be transported to the hospital emergency room.  If you develope a fever above 101 F, pus (white drainage) or increased drainage or redness at the wound, or calf pain, call your surgeon's office.     Change dressing    Complete by:  As directed   Change dressing daily with sterile 4 x 4 inch gauze dressing and apply TED hose. Do not submerge the incision under water.     Constipation Prevention    Complete by:  As directed   Drink plenty of fluids.  Prune juice may be helpful.  You may use a stool softener, such as Colace (over the counter) 100 mg twice a day.  Use MiraLax (over the counter) for constipation as needed.     Diet general    Complete by:  As directed      Discharge instructions    Complete by:  As directed   Pick up stool softner and laxative for home use following surgery while on pain medications. Do not submerge incision under water. Please use good hand washing techniques while changing dressing each day. May shower starting three days after surgery. Please use a clean towel to pat the incision dry following showers. Continue to use ice for pain and swelling after surgery. Do not use any lotions or creams on the incision until  instructed by your surgeon.  Take Xarelto for two and a half more weeks, then discontinue Xarelto. Once the patient has completed the Xarelto, they may resume the 81 mg Aspirin.  Postoperative Constipation Protocol  Constipation - defined medically as fewer than three stools per week and severe constipation as less than one stool per week.  One of the most common issues patients have following surgery is constipation.  Even if you have a regular bowel pattern at home, your normal regimen is likely to be disrupted due to multiple reasons following surgery.  Combination of anesthesia, postoperative narcotics, change in appetite and fluid intake all can affect your bowels.  In order to avoid complications following surgery, here are some recommendations in  order to help you during your recovery period.  Colace (docusate) - Pick up an over-the-counter form of Colace or another stool softener and take twice a day as long as you are requiring postoperative pain medications.  Take with a full glass of water daily.  If you experience loose stools or diarrhea, hold the colace until you stool forms back up.  If your symptoms do not get better within 1 week or if they get worse, check with your doctor.  Dulcolax (bisacodyl) - Pick up over-the-counter and take as directed by the product packaging as needed to assist with the movement of your bowels.  Take with a full glass of water.  Use this product as needed if not relieved by Colace only.   MiraLax (polyethylene glycol) - Pick up over-the-counter to have on hand.  MiraLax is a solution that will increase the amount of water in your bowels to assist with bowel movements.  Take as directed and can mix with a glass of water, juice, soda, coffee, or tea.  Take if you go more than two days without a movement. Do not use MiraLax more than once per day. Call your doctor if you are still constipated or irregular after using this medication for 7 days in a row.  If  you continue to have problems with postoperative constipation, please contact the office for further assistance and recommendations.  If you experience "the worst abdominal pain ever" or develop nausea or vomiting, please contact the office immediatly for further recommendations for treatment.  When discharged from the skilled rehab facility, please have the facility set up the patient's [place] prior to being released.  Please make sure this gets set up prior to release in order to avoid any lapse of therapy following the rehab stay.  Also provide the patient with their medications at time of release from the facility to include their pain medication, the muscle relaxants, and their blood thinner medication.  If the patient is still at the rehab facility at time of follow up appointment, please also assist the patient in arranging follow up appointment in our office and any transportation needs. ICE to the affected knee or hip every three hours for 30 minutes at a time and then as needed for pain and swelling.     Do not put a pillow under the knee. Place it under the heel.    Complete by:  As directed      Do not sit on low chairs, stoools or toilet seats, as it may be difficult to get up from low surfaces    Complete by:  As directed      Driving restrictions    Complete by:  As directed   No driving until released by the physician.     Increase activity slowly as tolerated    Complete by:  As directed      Lifting restrictions    Complete by:  As directed   No lifting until released by the physician.     Patient may shower    Complete by:  As directed   You may shower without a dressing once there is no drainage.  Do not wash over the wound.  If drainage remains, do not shower until drainage stops.     TED hose    Complete by:  As directed   Use stockings (TED hose) for 3 weeks on both leg(s).  You may remove them at night for sleeping.  Weight bearing as tolerated     Complete by:  As directed   Laterality:  right  Extremity:  Lower            Medication List    STOP taking these medications        aspirin EC 81 MG tablet     ORENCIA IV     TURMERIC CURCUMIN PO      TAKE these medications        acetaminophen 500 MG tablet  Commonly known as:  TYLENOL  Take 500 mg by mouth every 6 (six) hours as needed (Pain). Pain     atenolol 100 MG tablet  Commonly known as:  TENORMIN  Take 100 mg by mouth daily after breakfast.     bisacodyl 10 MG suppository  Commonly known as:  DULCOLAX  Place 1 suppository (10 mg total) rectally daily as needed for moderate constipation.     buPROPion 150 MG 24 hr tablet  Commonly known as:  WELLBUTRIN XL  Take 150 mg by mouth daily before breakfast.     docusate sodium 100 MG capsule  Commonly known as:  COLACE  Take 1 capsule (100 mg total) by mouth 2 (two) times daily.     DULoxetine 30 MG capsule  Commonly known as:  CYMBALTA  Take 30 mg by mouth daily.     folic acid 1 MG tablet  Commonly known as:  FOLVITE  Take 1 mg by mouth daily.     methocarbamol 500 MG tablet  Commonly known as:  ROBAXIN  Take 1 tablet (500 mg total) by mouth every 6 (six) hours as needed for muscle spasms.     metoCLOPramide 5 MG tablet  Commonly known as:  REGLAN  Take 1-2 tablets (5-10 mg total) by mouth every 8 (eight) hours as needed for nausea (if ondansetron (ZOFRAN) ineffective.).     omeprazole 40 MG capsule  Commonly known as:  PRILOSEC  Take 40 mg by mouth every morning.     ondansetron 4 MG tablet  Commonly known as:  ZOFRAN  Take 1 tablet (4 mg total) by mouth every 6 (six) hours as needed for nausea.     oxyCODONE 5 MG immediate release tablet  Commonly known as:  Oxy IR/ROXICODONE  Take 1-2 tablets (5-10 mg total) by mouth every 3 (three) hours as needed for moderate pain, severe pain or breakthrough pain.     polyethylene glycol packet  Commonly known as:  MIRALAX / GLYCOLAX  Take 17 g by  mouth daily as needed for mild constipation.     rivaroxaban 10 MG Tabs tablet  Commonly known as:  XARELTO  - Take 1 tablet (10 mg total) by mouth daily with breakfast. Take Xarelto for two and a half more weeks, then discontinue Xarelto.  - Once the patient has completed the Xarelto, they may resume the 81 mg Aspirin.     traMADol 50 MG tablet  Commonly known as:  ULTRAM  Take 1-2 tablets (50-100 mg total) by mouth every 6 (six) hours as needed (mild pain).       Follow-up Information    Follow up with Gearlean Alf, MD. Schedule an appointment as soon as possible for a visit on 04/26/2015.   Specialty:  Orthopedic Surgery   Why:  Call office at [phone] to setup appointment on Tuesday 04/26/2015 with Dr. Wynelle Link.   Contact information:   [address] [place] [address] [phone]  Signed: Arlee Muslim, PA-C Orthopaedic Surgery 04/13/2015, 10:21 AM

## 2015-04-12 NOTE — Evaluation (Signed)
Physical Therapy Evaluation Patient Details Name: Kiara Brown MRN: [id number] DOB: [birth year] Today's Date: 04/12/2015   History of Present Illness  Pt is 74 year old female s/p R TKA with hx of L TKA  Clinical Impression  Pt is s/p R TKA resulting in the deficits listed below (see PT Problem List).  Pt will benefit from skilled PT to increase their independence and safety with mobility to allow discharge to the venue listed below.   Pt able to tolerate short distance ambulation POD #1 and able to tolerate exercises.  Pt reports she plans to d/c to [facility].     Follow Up Recommendations SNF    Equipment Recommendations  None recommended by PT    Recommendations for Other Services       Precautions / Restrictions Precautions Precautions: Knee Required Braces or Orthoses: Knee Immobilizer - Right Knee Immobilizer - Right: Discontinue once straight leg raise with < 10 degree lag Restrictions Other Position/Activity Restrictions: WBAT      Mobility  Bed Mobility Overal bed mobility: Needs Assistance Bed Mobility: Supine to Sit     Supine to sit: HOB elevated;Min assist     General bed mobility comments: verbal cues for technique, assist for R LE  Transfers Overall transfer level: Needs assistance Equipment used: Rolling walker (2 wheeled) Transfers: Sit to/from Stand Sit to Stand: Min assist;From elevated surface         General transfer comment: verbal cues for hand placement, pt preferred bed elevated (states she has lift chair at home)  Ambulation/Gait Ambulation/Gait assistance: Min assist Ambulation Distance (Feet): 20 Feet Assistive device: Rolling walker (2 wheeled) Gait Pattern/deviations: Step-to pattern;Antalgic Gait velocity: decr   General Gait Details: verbal cues for sequence, RW distance, step length, WBing through RW for pain control, recliner following  Stairs            Wheelchair Mobility    Modified Rankin (Stroke  Patients Only)       Balance                                             Pertinent Vitals/Pain Pain Assessment: 0-10 Pain Score: 3  Pain Location: R knee Pain Descriptors / Indicators: Sore;Aching Pain Intervention(s): Limited activity within patient's tolerance;Monitored during session;Repositioned;Ice applied;Premedicated before session    Home Living Family/patient expects to be discharged to:: Skilled nursing facility Living Arrangements: Spouse/significant other               Additional Comments: spouse works    Prior Function Level of Independence: Independent with assistive device(s)               Hand Dominance        Extremity/Trunk Assessment               Lower Extremity Assessment: RLE deficits/detail RLE Deficits / Details: unable to perform SLR, good quad contraction, approx 30* AAROM R knee flexion       Communication   Communication: No difficulties  Cognition Arousal/Alertness: Awake/alert Behavior During Therapy: WFL for tasks assessed/performed Overall Cognitive Status: Within Functional Limits for tasks assessed                      General Comments      Exercises Total Joint Exercises Ankle Circles/Pumps: AROM;Both;10 reps Quad Sets: AROM;Both;10 reps Short Arc Quad: AROM;Right;10  reps Heel Slides: AAROM;Right;10 reps Hip ABduction/ADduction: AROM;Right;10 reps Straight Leg Raises: AAROM;Right;10 reps      Assessment/Plan    PT Assessment Patient needs continued PT services  PT Diagnosis Difficulty walking;Acute pain   PT Problem List Decreased range of motion;Decreased strength;Decreased mobility;Obesity;Decreased knowledge of use of DME;Pain  PT Treatment Interventions DME instruction;Gait training;Functional mobility training;Therapeutic activities;Therapeutic exercise;Patient/family education   PT Goals (Current goals can be found in the Care Plan section) Acute Rehab PT Goals PT  Goal Formulation: With patient Time For Goal Achievement: 04/19/15 Potential to Achieve Goals: Good    Frequency 7X/week   Barriers to discharge        Co-evaluation               End of Session Equipment Utilized During Treatment: Gait belt;Right knee immobilizer Activity Tolerance: Patient tolerated treatment well Patient left: with call bell/phone within reach;in chair           Time: 7517-0017 PT Time Calculation (min) (ACUTE ONLY): 38 min   Charges:   PT Evaluation $Initial PT Evaluation Tier I: 1 Procedure PT Treatments $Gait Training: 8-22 mins $Therapeutic Exercise: 8-22 mins   PT G Codes:        Margareta Laureano,KATHrine E 04/12/2015, 12:43 PM Carmelia Bake, PT, DPT 04/12/2015 Pager: [phone]

## 2015-04-12 NOTE — Progress Notes (Signed)
OT Cancellation Note  Patient Details Name: Kiara Brown MRN: [id number] DOB: [birth year]   Cancelled Treatment:    Reason Eval/Treat Not Completed: Other (comment).  Pt plans SNF for rehab after acute stay.  Will defer OT to that venue.  Hurshel Bouillon 04/12/2015, 10:19 AM  Lesle Chris, OTR/L [phone] 04/12/2015

## 2015-04-13 LAB — CBC
HCT: 32.7 % — ABNORMAL LOW (ref 36.0–46.0)
HEMOGLOBIN: 10.2 g/dL — AB (ref 12.0–15.0)
MCH: 28.7 pg (ref 26.0–34.0)
MCHC: 31.2 g/dL (ref 30.0–36.0)
MCV: 92.1 fL (ref 78.0–100.0)
PLATELETS: 245 10*3/uL (ref 150–400)
RBC: 3.55 MIL/uL — AB (ref 3.87–5.11)
RDW: 14.7 % (ref 11.5–15.5)
WBC: 12.3 10*3/uL — ABNORMAL HIGH (ref 4.0–10.5)

## 2015-04-13 LAB — BASIC METABOLIC PANEL
ANION GAP: 8 (ref 5–15)
BUN: 15 mg/dL (ref 6–20)
CO2: 27 mmol/L (ref 22–32)
Calcium: 8.6 mg/dL — ABNORMAL LOW (ref 8.9–10.3)
Chloride: 104 mmol/L (ref 101–111)
Creatinine, Ser: 0.95 mg/dL (ref 0.44–1.00)
GFR calc non Af Amer: 58 mL/min — ABNORMAL LOW (ref >60)
Glucose, Bld: 114 mg/dL — ABNORMAL HIGH (ref 65–99)
Potassium: 4.1 mmol/L (ref 3.5–5.1)
Sodium: 139 mmol/L (ref 135–145)

## 2015-04-13 NOTE — Progress Notes (Signed)
   Subjective: 2 Days Post-Op Procedure(s) (LRB): RIGHT TOTAL KNEE ARTHROPLASTY (Right) Patient reports pain as mild.   [name]. Patient is well, but has had some minor complaints of pain in the knee, requiring pain medications Patient is ready to go to SNF - [facility]  Objective: Vital signs in last 24 hours: Temp:  [97.6 F (36.4 C)-99.1 F (37.3 C)] 99.1 F (37.3 C) (05/18 0434) Pulse Rate:  [59-70] 70 (05/18 0434) Resp:  [18] 18 (05/18 0434) BP: (131-147)/(43-62) 131/43 mmHg (05/18 0434) SpO2:  [94 %-97 %] 97 % (05/18 0434)  Intake/Output from previous day:  Intake/Output Summary (Last 24 hours) at 04/13/15 1020 Last data filed at 04/12/15 2320  Gross per 24 hour  Intake    612 ml  Output   1550 ml  Net   -938 ml    Labs:  Recent Labs  04/12/15 0530 04/13/15 0428  HGB 10.7* 10.2*    Recent Labs  04/12/15 0530 04/13/15 0428  WBC 13.5* 12.3*  RBC 3.62* 3.55*  HCT 33.3* 32.7*  PLT 276 245    Recent Labs  04/12/15 0530 04/13/15 0428  NA 138 139  K 4.0 4.1  CL 105 104  CO2 24 27  BUN 13 15  CREATININE 0.80 0.95  GLUCOSE 137* 114*  CALCIUM 8.3* 8.6*   No results for input(s): LABPT, INR in the last 72 hours.  EXAM: General - Patient is Alert, Appropriate and Oriented Extremity - Neurovascular intact Sensation intact distally Dorsiflexion/Plantar flexion intact Incision - clean, dry, no drainage, healing Motor Function - intact, moving foot and toes well on exam.   Assessment/Plan: 2 Days Post-Op Procedure(s) (LRB): RIGHT TOTAL KNEE ARTHROPLASTY (Right) Procedure(s) (LRB): RIGHT TOTAL KNEE ARTHROPLASTY (Right) Past Medical History  Diagnosis Date  . Diverticulosis   . Hyperlipidemia   . H/O hiatal hernia   . Dysrhythmia     hx rapid heartbeat, no cardiologist  . GERD (gastroesophageal reflux disease)   . Fibromyalgia     peripheral neuropathy  . Sciatica   . Fibromyalgia   . PONV (postoperative  nausea and vomiting)     "THROAT WITH EXTREME EYCXKGY"1856, ulnar nerve surgery  . Arthritis     osteo, rheumatoid  . UTI (lower urinary tract infection)   . Shortness of breath dyspnea     with exertion   Active Problems:   OA (osteoarthritis) of knee  Estimated body mass index is 43.41 kg/(m^2) as calculated from the following:   Height as of this encounter: 5' 2.99" (1.6 m).   Weight as of this encounter: 111.13 kg (245 lb). Up with therapy Discharge to SNF Diet - Regular diet Follow up - in 2 weeks Activity - WBAT Disposition - [place] Condition Upon Discharge - Good D/C Meds - See DC Summary DVT Prophylaxis - Xarelto  Kiara Muslim, PA-C Orthopaedic Surgery 04/13/2015, 10:20 AM

## 2015-04-13 NOTE — Care Management Note (Signed)
Case Management Note  Patient Details  Name: MERIDITH ROMICK MRN: [id number] Date of Birth: [birth year]  Subjective/Objective:                  OA  Action/Plan:  Discharge planning Expected Discharge Date:                  Expected Discharge Plan:  [place]  In-House Referral:     Discharge planning Services  CM Consult  Post Acute Care Choice:    Choice offered to:     DME Arranged:    DME Agency:     HH Arranged:    [place] Agency:     Status of Service:  Completed, signed off  Medicare Important Message Given:    Date Medicare IM Given:    Medicare IM give by:    Date Additional Medicare IM Given:    Additional Medicare Important Message give by:     If discussed at [place] of Stay Meetings, dates discussed:    Additional Comments: CM notes pt to go to SNF; CSW arranging.  No other CM needs were communicated. Dellie Catholic, RN 04/13/2015, 9:31 AM

## 2015-04-13 NOTE — Progress Notes (Signed)
Report called to sheri p at [facility]  D [employer]

## 2015-04-13 NOTE — Clinical Social Work Placement (Signed)
   CLINICAL SOCIAL WORK PLACEMENT  NOTE  Date:  04/13/2015  Patient Details  Name: Kiara Brown MRN: [id number] Date of Birth: 08-Oct-1941  Clinical Social Work is seeking post-discharge placement for this patient at the [place] level of care (*CSW will initial, date and re-position this form in  chart as items are completed):  No   Patient/family provided with [facility] Work Department's list of facilities offering this level of care within the geographic area requested by the patient (or if unable, by the patient's family).  Yes   Patient/family informed of their freedom to choose among providers that offer the needed level of care, that participate in Medicare, Medicaid or managed care program needed by the patient, have an available bed and are willing to accept the patient.  No   Patient/family informed of [facility]'s ownership interest in [facility] and [facility], as well as of the fact that they are under no obligation to receive care at these facilities.  PASRR submitted to EDS on       PASRR number received on       Existing PASRR number confirmed on 04/12/15     FL2 transmitted to all facilities in geographic area requested by pt/family on 04/12/15     FL2 transmitted to all facilities within larger geographic area on       Patient informed that his/her managed care company has contracts with or will negotiate with certain facilities, including the following:        Yes   Patient/family informed of bed offers received.  Patient chooses bed at [facility]     Physician recommends and patient chooses bed at      Patient to be transferred to  [facility] on  04/13/15  Patient to be transferred to facility by   Family transport    Patient family notified on  04/13/15 of transfer.  Name of family member notified:     Pt contacted spouse directly.   PHYSICIAN       Additional Comment: Pt is in agreement with d/c to  [facility] today. NSG approved transport by car. NSG reviewed d/c summary, scripts, avs. Scripts included in d/c packet. D/C packet provided to pt prior to d/c.    _______________________________________________ Luretha Rued, LCSW [phone] 04/13/2015, 3:48 PM

## 2015-04-13 NOTE — Progress Notes (Signed)
Physical Therapy Treatment Patient Details Name: Kiara Brown MRN: [id number] DOB: 05-04-1941 Today's Date: 05-12-2015    History of Present Illness Pt is 74 year old female s/p R TKA with hx of L TKA    PT Comments    Pt ambulated in hallway and performed LE exercises.  Pt to d/c to SNF today.  Follow Up Recommendations  SNF     Equipment Recommendations  None recommended by PT    Recommendations for Other Services       Precautions / Restrictions Precautions Precautions: Knee Required Braces or Orthoses: Knee Immobilizer - Right Knee Immobilizer - Right: Discontinue once straight leg raise with < 10 degree lag Restrictions Other Position/Activity Restrictions: WBAT    Mobility  Bed Mobility Overal bed mobility: Needs Assistance Bed Mobility: Supine to Sit     Supine to sit: Min assist     General bed mobility comments: verbal cues for technique, assist for R LE  Transfers Overall transfer level: Needs assistance Equipment used: Rolling walker (2 wheeled) Transfers: Sit to/from Stand Sit to Stand: Min guard         General transfer comment: verbal cues for safe technique  Ambulation/Gait Ambulation/Gait assistance: Min guard Ambulation Distance (Feet): 40 Feet Assistive device: Rolling walker (2 wheeled) Gait Pattern/deviations: Step-to pattern;Antalgic Gait velocity: decr   General Gait Details: verbal cues for sequence, RW distance, step length, WBing through RW for pain control   Stairs            Wheelchair Mobility    Modified Rankin (Stroke Patients Only)       Balance                                    Cognition Arousal/Alertness: Awake/alert Behavior During Therapy: WFL for tasks assessed/performed Overall Cognitive Status: Within Functional Limits for tasks assessed                      Exercises Total Joint Exercises Ankle Circles/Pumps: AROM;Both;10 reps Quad Sets: AROM;Both;10 reps Towel  Squeeze: AROM;10 reps;Both Short Arc Quad: AROM;Right;10 reps Heel Slides: AAROM;Right;10 reps Hip ABduction/ADduction: AROM;Right;10 reps Straight Leg Raises: AAROM;Right;10 reps    General Comments        Pertinent Vitals/Pain Pain Assessment: 0-10 Pain Score: 3  Pain Location: R knee Pain Descriptors / Indicators: Sore;Aching Pain Intervention(s): Limited activity within patient's tolerance;Monitored during session;Premedicated before session;Repositioned;Ice applied    Home Living                      Prior Function            PT Goals (current goals can now be found in the care plan section) Progress towards PT goals: Progressing toward goals    Frequency  7X/week    PT Plan Current plan remains appropriate    Co-evaluation             End of Session Equipment Utilized During Treatment: Right knee immobilizer;Gait belt Activity Tolerance: Patient tolerated treatment well Patient left: with call bell/phone within reach;in chair     Time: 8299-3716 PT Time Calculation (min) (ACUTE ONLY): 26 min  Charges:  $Gait Training: 8-22 mins $Therapeutic Exercise: 8-22 mins                    G Codes:      Haille Pardi,KATHrine E 2015-05-12, 1:42 PM  Carmelia Bake, PT, DPT 04/13/2015 Pager: [phone]

## 2015-04-14 ENCOUNTER — Non-Acute Institutional Stay (SKILLED_NURSING_FACILITY): Payer: Medicare Other | Admitting: Adult Health

## 2015-04-14 ENCOUNTER — Encounter: Payer: Self-pay | Admitting: Adult Health

## 2015-04-14 DIAGNOSIS — I10 Essential (primary) hypertension: Secondary | ICD-10-CM | POA: Diagnosis not present

## 2015-04-14 DIAGNOSIS — M1711 Unilateral primary osteoarthritis, right knee: Secondary | ICD-10-CM

## 2015-04-14 DIAGNOSIS — G629 Polyneuropathy, unspecified: Secondary | ICD-10-CM | POA: Diagnosis not present

## 2015-04-14 DIAGNOSIS — K5900 Constipation, unspecified: Secondary | ICD-10-CM | POA: Diagnosis not present

## 2015-04-14 DIAGNOSIS — K219 Gastro-esophageal reflux disease without esophagitis: Secondary | ICD-10-CM | POA: Diagnosis not present

## 2015-04-14 DIAGNOSIS — F329 Major depressive disorder, single episode, unspecified: Secondary | ICD-10-CM | POA: Diagnosis not present

## 2015-04-14 DIAGNOSIS — D72829 Elevated white blood cell count, unspecified: Secondary | ICD-10-CM

## 2015-04-14 DIAGNOSIS — F32A Depression, unspecified: Secondary | ICD-10-CM

## 2015-04-14 NOTE — Progress Notes (Signed)
Patient ID: Kiara Brown, female   DOB: 1941-10-18, 74 y.o.   MRN: [id number]   04/14/2015  Facility:  Nursing Home Location:  [place] Room Number: 703-P LEVEL OF CARE:  SNF (31)    Chief Complaint  Patient presents with  . Hospitalization Follow-up    Osteoarthritis S/P right total knee arthroplasty, hypertension, depression, constipation, neuropathy, leukocytosis and GERD    HISTORY OF PRESENT ILLNESS:  This is a 74 year old female who was been admitted to [facility] on 04/[facility] from [facility] with osteoarthritis S/P right total knee arthroplasty. She has PMH of diverticulosis, hyperlipidemia and history of hiatal hernia.   She has been admitted for a short-term rehabilitation.  PAST MEDICAL HISTORY:  Past Medical History  Diagnosis Date  . Diverticulosis   . Hyperlipidemia   . H/O hiatal hernia   . Dysrhythmia     hx rapid heartbeat, no cardiologist  . GERD (gastroesophageal reflux disease)   . Fibromyalgia     peripheral neuropathy  . Sciatica   . Fibromyalgia   . PONV (postoperative nausea and vomiting)     "THROAT WITH EXTREME FVCBSWH"6759, ulnar nerve surgery  . Arthritis     osteo, rheumatoid  . UTI (lower urinary tract infection)   . Shortness of breath dyspnea     with exertion    CURRENT MEDICATIONS: Reviewed per MAR/see medication list  No Known Allergies   REVIEW OF SYSTEMS:  GENERAL: no change in appetite, no fatigue, no weight changes, no fever, chills or weakness RESPIRATORY: no cough, SOB, DOE, wheezing, hemoptysis CARDIAC: no chest pain, edema or palpitations GI: no abdominal pain, diarrhea, constipation, heart burn, nausea or vomiting  PHYSICAL EXAMINATION  GENERAL: no acute distress, obese SKIN:  Right knee surgical incision is dry, no redness EYES: conjunctivae normal, sclerae normal, normal eye lids NECK: supple, trachea midline, no neck masses, no thyroid tenderness, no  thyromegaly LYMPHATICS: no LAN in the neck, no supraclavicular LAN RESPIRATORY: breathing is even & unlabored, BS CTAB CARDIAC: RRR, no murmur,no extra heart sounds, no edema GI: abdomen soft, normal BS, no masses, no tenderness, no hepatomegaly, no splenomegaly EXTREMITIES: Able to move 4 extremities PSYCHIATRIC: the patient is alert & oriented to person, affect & behavior appropriate  LABS/RADIOLOGY: Labs reviewed: Basic Metabolic Panel:  Recent Labs  04/06/15 1335 04/12/15 0530 04/13/15 0428  NA 134* 138 139  K 4.0 4.0 4.1  CL 98* 105 104  CO2 27 24 27   GLUCOSE 98 137* 114*  BUN 15 13 15   CREATININE 0.93 0.80 0.95  CALCIUM 8.9 8.3* 8.6*   Liver Function Tests:  Recent Labs  04/06/15 1335  AST 20  ALT 16  ALKPHOS 89  BILITOT 0.5  PROT 7.0  ALBUMIN 3.8   CBC:  Recent Labs  04/06/15 1335 04/12/15 0530 04/13/15 0428  WBC 9.2 13.5* 12.3*  HGB 12.2 10.7* 10.2*  HCT 38.8 33.3* 32.7*  MCV 91.7 92.0 92.1  PLT 310 276 245     ASSESSMENT/PLAN:  Osteoarthritis S/P right total knee arthroplasty - for rehabilitation; continue Xarelto 10 mg by mouth daily for 2  1/2 weeks then aspirin 81 mg by mouth daily for DVT prophylaxis; oxycodone 5 mg 1-2 tabs by mouth every 3 hours when necessary and Ultram 50 mg 1-2 tabs by mouth every 6 hours when necessary for pain; and Robaxin 500 mg 1 tab by mouth every 6 hours when necessary for muscle spasm Hypertension - well controlled; continue  atenolol 100 mg by mouth daily Depression - mood is stable; continue Wellbutrin XL 150 mg by mouth daily Constipation - continue Colace 100 mg by mouth twice a day and MiraLAX 17 g by mouth daily when necessary Neuropathy - continue Cymbalta 30 mg by mouth daily GERD - continue Prilosec 40 mg by mouth daily Leukocytosis - wbc 12.3; down compared to 5/17 wbc 13.5   Goals of care:  Short-term rehabilitation  Spent 50 minutes in patient care.    [facility], NP [employer] [phone]

## 2015-04-15 ENCOUNTER — Encounter: Payer: Self-pay | Admitting: Adult Health

## 2015-04-15 ENCOUNTER — Non-Acute Institutional Stay (SKILLED_NURSING_FACILITY): Payer: Medicare Other | Admitting: Adult Health

## 2015-04-15 DIAGNOSIS — M1711 Unilateral primary osteoarthritis, right knee: Secondary | ICD-10-CM

## 2015-04-15 DIAGNOSIS — I10 Essential (primary) hypertension: Secondary | ICD-10-CM

## 2015-04-15 DIAGNOSIS — F329 Major depressive disorder, single episode, unspecified: Secondary | ICD-10-CM

## 2015-04-15 DIAGNOSIS — K5900 Constipation, unspecified: Secondary | ICD-10-CM | POA: Diagnosis not present

## 2015-04-15 DIAGNOSIS — G629 Polyneuropathy, unspecified: Secondary | ICD-10-CM

## 2015-04-15 DIAGNOSIS — D72829 Elevated white blood cell count, unspecified: Secondary | ICD-10-CM | POA: Diagnosis not present

## 2015-04-15 DIAGNOSIS — K219 Gastro-esophageal reflux disease without esophagitis: Secondary | ICD-10-CM | POA: Diagnosis not present

## 2015-04-15 DIAGNOSIS — F32A Depression, unspecified: Secondary | ICD-10-CM

## 2015-04-15 LAB — TYPE AND SCREEN
ABO/RH(D): A NEG
Antibody Screen: POSITIVE
DAT, IGG: NEGATIVE
Unit division: 0
Unit division: 0

## 2015-04-15 NOTE — Progress Notes (Signed)
Patient ID: Kiara Brown, female   DOB: Sep 09, 1941, 74 y.o.   MRN: [id number]    04/15/2015  Facility:  Nursing Home Location:  [place] Room Number: 703-P LEVEL OF CARE:  SNF (31)    Chief Complaint  Patient presents with  . Discharge Note    Osteoarthritis S/P right total knee arthroplasty, hypertension, depression, constipation, neuropathy, leukocytosis and GERD    HISTORY OF PRESENT ILLNESS:  This is a 74 year old female who is for discharge home and will have outpatient rehabilitation. She has been admitted to [facility] on 04/13/15 from [facility] with osteoarthritis S/P right total knee arthroplasty. She has PMH of diverticulosis, hyperlipidemia and history of hiatal hernia.   Patient was admitted to this facility for short-term rehabilitation after the patient's recent hospitalization.  Patient has completed SNF rehabilitation and therapy has cleared the patient for discharge.  PAST MEDICAL HISTORY:  Past Medical History  Diagnosis Date  . Diverticulosis   . Hyperlipidemia   . H/O hiatal hernia   . Dysrhythmia     hx rapid heartbeat, no cardiologist  . GERD (gastroesophageal reflux disease)   . Fibromyalgia     peripheral neuropathy  . Sciatica   . Fibromyalgia   . PONV (postoperative nausea and vomiting)     "THROAT WITH EXTREME YIFOYDX"4128, ulnar nerve surgery  . Arthritis     osteo, rheumatoid  . UTI (lower urinary tract infection)   . Shortness of breath dyspnea     with exertion    CURRENT MEDICATIONS: Reviewed per MAR/see medication list  No Known Allergies   REVIEW OF SYSTEMS:  GENERAL: no change in appetite, no fatigue, no weight changes, no fever, chills or weakness RESPIRATORY: no cough, SOB, DOE, wheezing, hemoptysis CARDIAC: no chest pain, edema or palpitations GI: no abdominal pain, diarrhea, heart burn, nausea or vomiting, +constipation  PHYSICAL EXAMINATION  GENERAL: no acute distress,  obese SKIN:  Right knee surgical incision is dry, no redness NECK: supple, trachea midline, no neck masses, no thyroid tenderness, no thyromegaly LYMPHATICS: no LAN in the neck, no supraclavicular LAN RESPIRATORY: breathing is even & unlabored, BS CTAB CARDIAC: RRR, no murmur,no extra heart sounds, no edema GI: abdomen soft, normal BS, no masses, no tenderness, no hepatomegaly, no splenomegaly EXTREMITIES: Able to move 4 extremities PSYCHIATRIC: the patient is alert & oriented to person, affect & behavior appropriate  LABS/RADIOLOGY: Labs reviewed: Basic Metabolic Panel:  Recent Labs  04/06/15 1335 04/12/15 0530 04/13/15 0428  NA 134* 138 139  K 4.0 4.0 4.1  CL 98* 105 104  CO2 27 24 27   GLUCOSE 98 137* 114*  BUN 15 13 15   CREATININE 0.93 0.80 0.95  CALCIUM 8.9 8.3* 8.6*   Liver Function Tests:  Recent Labs  04/06/15 1335  AST 20  ALT 16  ALKPHOS 89  BILITOT 0.5  PROT 7.0  ALBUMIN 3.8   CBC:  Recent Labs  04/06/15 1335 04/12/15 0530 04/13/15 0428  WBC 9.2 13.5* 12.3*  HGB 12.2 10.7* 10.2*  HCT 38.8 33.3* 32.7*  MCV 91.7 92.0 92.1  PLT 310 276 245     ASSESSMENT/PLAN:  Osteoarthritis S/P right total knee arthroplasty - for outpatient rehabilitation; continue Xarelto 10 mg by mouth daily for 13 more days after dischared then aspirin 81 mg by mouth daily for DVT prophylaxis; oxycodone 5 mg 1-2 tabs by mouth every 3 hours when necessary and Ultram 50 mg 1-2 tabs by mouth every 6 hours  when necessary for pain; and Robaxin 500 mg 1 tab by mouth every 6 hours when necessary for muscle spasm Hypertension - well controlled; continue atenolol 100 mg by mouth daily Depression - mood is stable; continue Wellbutrin XL 150 mg by mouth daily Constipation - continue Colace 100 mg by mouth twice a day MiraLAX 17 g by mouth daily when necessary; start Lactulose 20mg /30 ml PO BID X 1 day Neuropathy - continue Cymbalta 30 mg by mouth daily GERD - continue Prilosec 40 mg by  mouth daily Leukocytosis - wbc 12.3; down compared to 5/17 wbc 13.5    I have filled out patient's discharge paperwork and written prescriptions.  Patient will have outpatient rehabilitation..  Total discharge time: Less than 30 minutes  Discharge time involved coordination of the discharge process with social worker, nursing staff and therapy department.     [facility], NP [employer] [phone]

## 2015-04-19 ENCOUNTER — Ambulatory Visit: Payer: Medicare Other | Attending: Orthopedic Surgery | Admitting: Physical Therapy

## 2015-04-19 ENCOUNTER — Encounter: Payer: Self-pay | Admitting: Physical Therapy

## 2015-04-19 DIAGNOSIS — R262 Difficulty in walking, not elsewhere classified: Secondary | ICD-10-CM | POA: Insufficient documentation

## 2015-04-19 DIAGNOSIS — M25561 Pain in right knee: Secondary | ICD-10-CM | POA: Insufficient documentation

## 2015-04-19 DIAGNOSIS — R29898 Other symptoms and signs involving the musculoskeletal system: Secondary | ICD-10-CM | POA: Diagnosis not present

## 2015-04-19 DIAGNOSIS — M25661 Stiffness of right knee, not elsewhere classified: Secondary | ICD-10-CM

## 2015-04-19 NOTE — Therapy (Signed)
[place], [place], [address] Phone: [phone]   Fax:  [phone]  Physical Therapy Evaluation  Patient Details  Name: Kiara Brown MRN: [id number] Date of Birth: 1941-06-13 Referring Provider:  Gaynelle Arabian, MD  Encounter Date: 04/19/2015      PT End of Session - 04/19/15 1645    Visit Number 1   Date for PT Re-Evaluation 06/19/15   PT Start Time 1619   PT Stop Time 1710   PT Time Calculation (min) 51 min   Activity Tolerance Patient tolerated treatment well;Patient limited by pain      Past Medical History  Diagnosis Date  . Diverticulosis   . Hyperlipidemia   . H/O hiatal hernia   . Dysrhythmia     hx rapid heartbeat, no cardiologist  . GERD (gastroesophageal reflux disease)   . Fibromyalgia     peripheral neuropathy  . Sciatica   . Fibromyalgia   . PONV (postoperative nausea and vomiting)     "THROAT WITH EXTREME [id number]"0272, ulnar nerve surgery  . Arthritis     osteo, rheumatoid  . UTI (lower urinary tract infection)   . Shortness of breath dyspnea     with exertion    Past Surgical History  Procedure Laterality Date  . Rotator cuff repair      right  . Appendectomy    . Breast surgery      biopsy  . Cystoscopy with biopsy    . Abdominal hysterectomy    . Knee arthroscopy      bilateral  . Cardiac catheterization  2009    "no blockage per pt"  . Cholecystectomy    . Incontinence surgery    . Total knee arthroplasty  08/04/2012    Procedure: TOTAL KNEE ARTHROPLASTY;  Surgeon: Gearlean Alf, MD;  Location: [facility];  Service: Orthopedics;  Laterality: Left;  . Ulnar nerve surgery Right 1986  . Knee arthroscopy Left 03/17/2014    Procedure: ARTHROSCOPY LEFT KNEE WITH SYNOVECTOMY;  Surgeon: Gearlean Alf, MD;  Location: [facility];  Service: Orthopedics;  Laterality: Left;  . Total knee arthroplasty Right 04/11/2015    Procedure: RIGHT TOTAL KNEE ARTHROPLASTY;   Surgeon: Gaynelle Arabian, MD;  Location: [facility];  Service: Orthopedics;  Laterality: Right;    There were no vitals filed for this visit.  Visit Diagnosis:  Right knee pain - Plan: PT plan of care cert/re-cert  Decreased ROM of right knee - Plan: PT plan of care cert/re-cert  Difficulty walking - Plan: PT plan of care cert/re-cert      Subjective Assessment - 04/19/15 1620    Subjective Patient underwent a right TKR on 04/11/15.  2 day hospital stay and then 5 days in rehab.  She reports that she is having a lot of pain   Limitations Walking;Standing;House hold activities   How long can you sit comfortably? 15   How long can you stand comfortably? 5   How long can you walk comfortably? 5   Patient Stated Goals no pain and walk better   Currently in Pain? Yes   Pain Score 8    Pain Location Knee   Pain Orientation Right   Pain Descriptors / Indicators Aching   Pain Type Surgical pain   Pain Onset 1 to 4 weeks ago   Pain Frequency Constant   Aggravating Factors  being up on feet and walking pain up to 10/10   Pain Relieving  Factors ice and rest help             [facility] PT Assessment - 04/19/15 0001    Assessment   Medical Diagnosis right TKR    Onset Date/Surgical Date 04/11/15   Prior Therapy in hospital and rehab facility   Precautions   Precautions None   Restrictions   Weight Bearing Restrictions No   Balance Screen   Has the patient fallen in the past 6 months Yes   How many times? 2   Has the patient had a decrease in activity level because of a fear of falling?  No   Is the patient reluctant to leave their home because of a fear of falling?  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, some walking,  lifting bags   Leisure did not exercise   AROM   Overall AROM Comments AROM of the right knee 30-77 degrees flexion, PROM 10-90 degrees flexion   Strength   Overall Strength Comments 4/5 with pain   Palpation   Palpation comment significant adipose tissue around the lower legs, some limits in ROM due to this, swelling, scar is tight and sensitive   Ambulation/Gait   Gait Comments uses a walker, very slow gait, step to pattern, stiff right knee with walking                   [facility] Adult PT Treatment/Exercise - 04/19/15 0001    Knee/Hip Exercises: Aerobic   Isokinetic NuStep L4 x 5 minutes   Cryotherapy   Number Minutes Cryotherapy 15 Minutes   Cryotherapy Location Knee   Type of Cryotherapy Ice pack   Electrical Stimulation   Electrical Stimulation Location right knee   Electrical Stimulation Parameters IFC   Electrical Stimulation Goals Pain;Edema                PT Education - 04/19/15 1644    Education provided Yes   Education Details HEP for low load long duration stretch into flexion and extension   Person(s) Educated Patient   Methods Explanation;Demonstration;Handout   Comprehension Verbalized understanding          PT Short Term Goals - 04/19/15 1648    PT SHORT TERM GOAL #1   Title independent with initial HEP   Time 1   Period Weeks   Status New           PT Long Term Goals - 04/19/15 1648    PT LONG TERM GOAL #1   Title walk 500 feet without assistive device   Time 8   Period Weeks   Status New   PT LONG TERM GOAL #2   Title decrease pain 50%   Time 8   Period Weeks   Status New   PT LONG TERM GOAL #3   Title increase AROM of the right knee to 10-110 degrees flexion   Time 8   Period Weeks   Status New   PT LONG TERM GOAL #4   Title be independent with RICE   Time 8   Period Weeks   Status New   PT LONG TERM GOAL #5   Title increase strength to 4+/5   Time  [place]  - 2015-05-07 1646    Clinical Impression Statement Patient with right TKR 8 days ago, c/o pain, has significant stiffness and difficulty walking.  She has very poor ROM to 77 degrees flexion actively   Pt will benefit from skilled therapeutic intervention in order to improve on the following deficits Abnormal gait;Decreased activity tolerance;Decreased mobility;Decreased range of motion;Difficulty walking;Decreased strength;Increased edema;Pain   Rehab Potential Good   PT Frequency 3x / week   PT Duration 4 weeks   PT Treatment/Interventions Cryotherapy;Electrical Stimulation;Gait training;Functional mobility Scientist, forensic;Therapeutic exercise;Balance training;Manual techniques;Patient/family education;Vasopneumatic Device   PT Next Visit Plan add exercises and gait as tolerated   Consulted and Agree with Plan of Care Patient          G-Codes - 05-07-15 1649    Functional Assessment Tool Used FOTO   Functional Limitation Mobility: Walking and moving around   Mobility: Walking and Moving Around Current Status [phone]) At least 60 percent but less than 80 percent impaired, limited or restricted   Mobility: Walking and Moving Around Goal Status [phone]) At least 40 percent but less than 60 percent impaired, limited or restricted       Problem List Patient Active Problem List   Diagnosis Date Noted  . Synovitis of knee 03/16/2014  . Postop Hypokalemia 08/07/2012  . Postop Hyponatremia 08/06/2012  . OA (osteoarthritis) of knee 08/04/2012    Sumner Boast., PT 05-07-2015, 4:51 PM  [address] [place], [place], [address] Phone: [phone]   Fax:  [phone]

## 2015-04-22 ENCOUNTER — Ambulatory Visit: Payer: Medicare Other | Admitting: Physical Therapy

## 2015-04-22 ENCOUNTER — Encounter: Payer: Self-pay | Admitting: Physical Therapy

## 2015-04-22 DIAGNOSIS — R262 Difficulty in walking, not elsewhere classified: Secondary | ICD-10-CM

## 2015-04-22 DIAGNOSIS — M25561 Pain in right knee: Secondary | ICD-10-CM | POA: Diagnosis not present

## 2015-04-22 DIAGNOSIS — M25661 Stiffness of right knee, not elsewhere classified: Secondary | ICD-10-CM

## 2015-04-22 NOTE — Therapy (Signed)
[place], [place], [address] Phone: [phone]   Fax:  [phone]  Physical Therapy Treatment  Patient Details  Name: MARCENA DIAS MRN: [id number] Date of Birth: 1941-07-11 Referring Provider:  Gaynelle Arabian, MD  Encounter Date: 04/22/2015      PT End of Session - 04/22/15 1137    Visit Number 2   PT Start Time 5465   PT Stop Time 6812   PT Time Calculation (min) 48 min      Past Medical History  Diagnosis Date  . Diverticulosis   . Hyperlipidemia   . H/O hiatal hernia   . Dysrhythmia     hx rapid heartbeat, no cardiologist  . GERD (gastroesophageal reflux disease)   . Fibromyalgia     peripheral neuropathy  . Sciatica   . Fibromyalgia   . PONV (postoperative nausea and vomiting)     "THROAT WITH EXTREME [id number]"7494, ulnar nerve surgery  . Arthritis     osteo, rheumatoid  . UTI (lower urinary tract infection)   . Shortness of breath dyspnea     with exertion    Past Surgical History  Procedure Laterality Date  . Rotator cuff repair      right  . Appendectomy    . Breast surgery      biopsy  . Cystoscopy with biopsy    . Abdominal hysterectomy    . Knee arthroscopy      bilateral  . Cardiac catheterization  2009    "no blockage per pt"  . Cholecystectomy    . Incontinence surgery    . Total knee arthroplasty  08/04/2012    Procedure: TOTAL KNEE ARTHROPLASTY;  Surgeon: Gearlean Alf, MD;  [facility]: [facility];  Service: Orthopedics;  Laterality: Left;  . Ulnar nerve surgery Right 1986  . Knee arthroscopy Left 03/17/2014    Procedure: ARTHROSCOPY LEFT KNEE WITH SYNOVECTOMY;  Surgeon: Gearlean Alf, MD;  [facility];  Service: Orthopedics;  Laterality: Left;  . Total knee arthroplasty Right 04/11/2015    Procedure: RIGHT TOTAL KNEE ARTHROPLASTY;  Surgeon: Gaynelle Arabian, MD;  Location: [facility];  Service: Orthopedics;  Laterality: Right;    There were no vitals  filed for this visit.  Visit Diagnosis:  Right knee pain  Decreased ROM of right knee  Difficulty walking      Subjective Assessment - 04/22/15 1105    Subjective I am swimmy headed today.  Reports that she has been in a lot of pain.   Currently in Pain? Yes   Pain Score 8    Pain Location Knee   Pain Orientation Right   Pain Descriptors / Indicators Aching   Pain Type Surgical pain                         [facility] Adult PT Treatment/Exercise - 04/22/15 0001    Knee/Hip Exercises: Aerobic   Isokinetic NuStep Level 1 x 5 minutes   Knee/Hip Exercises: Seated   Long Arc Quad Right  tried with 3#, but pain, decreased to no weight   Other Seated Knee Exercises ankle pumps, ball b/n knees squeeze   Cryotherapy   Number Minutes Cryotherapy 15 Minutes   Cryotherapy Location Knee   Type of Cryotherapy Ice pack   Electrical Stimulation   Electrical Stimulation Location right knee   Electrical Stimulation Parameters IFC   Electrical Stimulation Goals Pain;Edema   Manual Therapy  Manual therapy comments tried some gentle PROM and scar mobilization, she was unable to tolerate today   Knee/Hip Exercises: Machines for Strengthening   Cybex Knee Extension 5# x 5 , unable to do more due to pain in back of leg from pressure of machine   Cybex Knee Flexion 25# 3x10                  PT Short Term Goals - 04/19/15 1648    PT SHORT TERM GOAL #1   Title independent with initial HEP   Time 1   Period Weeks   Status New           PT Long Term Goals - 04/19/15 1648    PT LONG TERM GOAL #1   Title walk 500 feet without assistive device   Time 8   Period Weeks   Status New   PT LONG TERM GOAL #2   Title decrease pain 50%   Time 8   Period Weeks   Status New   PT LONG TERM GOAL #3   Title increase AROM of the right knee to 10-110 degrees flexion   Time 8   Period Weeks   Status New   PT LONG TERM GOAL #4   Title be independent with RICE   Time 8    Period Weeks   Status New   PT LONG TERM GOAL #5   Title increase strength to 4+/5   Time 8   Period Weeks   Status New               Plan - 04/22/15 1138    Clinical Impression Statement Patient with increased c/o pain and sensitivity to touch on the right knee today and in the right thigh, she does complain of being dizzy today.   PT Next Visit Plan try to get her moving and tolerant of touch and motions   Consulted and Agree with Plan of Care Patient        Problem List Patient Active Problem List   Diagnosis Date Noted  . Synovitis of knee 03/16/2014  . Postop Hypokalemia 08/07/2012  . Postop Hyponatremia 08/06/2012  . OA (osteoarthritis) of knee 08/04/2012    Sumner Boast., PT 04/22/2015, 11:41 AM  [address] [place], [place], [address] Phone: [phone]   Fax:  [phone]

## 2015-04-26 ENCOUNTER — Encounter: Payer: Self-pay | Admitting: Physical Therapy

## 2015-04-26 ENCOUNTER — Ambulatory Visit: Payer: Medicare Other | Admitting: Physical Therapy

## 2015-04-26 DIAGNOSIS — M25561 Pain in right knee: Secondary | ICD-10-CM

## 2015-04-26 DIAGNOSIS — M25661 Stiffness of right knee, not elsewhere classified: Secondary | ICD-10-CM

## 2015-04-26 DIAGNOSIS — R262 Difficulty in walking, not elsewhere classified: Secondary | ICD-10-CM

## 2015-04-26 NOTE — Therapy (Signed)
[place] [address] [place], [place], [address] Phone: [phone]   Fax:  [phone]  Physical Therapy Treatment  Patient Details  Name: Kiara Brown MRN: [id number] Date of Birth: [birth year] Referring Provider:  Gaynelle Arabian, MD  Encounter Date: 04/26/2015      PT End of Session - 04/26/15 1755    Visit Number 3   Date for PT Re-Evaluation 06/19/15   PT Start Time 1703   PT Stop Time 1754   PT Time Calculation (min) 51 min   Activity Tolerance Patient limited by pain   Behavior During Therapy [facility] for tasks assessed/performed      Past Medical History  Diagnosis Date  . Diverticulosis   . Hyperlipidemia   . H/O hiatal hernia   . Dysrhythmia     hx rapid heartbeat, no cardiologist  . GERD (gastroesophageal reflux disease)   . Fibromyalgia     peripheral neuropathy  . Sciatica   . Fibromyalgia   . PONV (postoperative nausea and vomiting)     "THROAT WITH EXTREME [id number]"2683, ulnar nerve surgery  . Arthritis     osteo, rheumatoid  . UTI (lower urinary tract infection)   . Shortness of breath dyspnea     with exertion    Past Surgical History  Procedure Laterality Date  . Rotator cuff repair      right  . Appendectomy    . Breast surgery      biopsy  . Cystoscopy with biopsy    . Abdominal hysterectomy    . Knee arthroscopy      bilateral  . Cardiac catheterization  2009    "no blockage per pt"  . Cholecystectomy    . Incontinence surgery    . Total knee arthroplasty  08/04/2012    Procedure: TOTAL KNEE ARTHROPLASTY;  Surgeon: Gearlean Alf, MD;  Location: [facility];  Service: Orthopedics;  Laterality: Left;  . Ulnar nerve surgery Right 1986  . Knee arthroscopy Left 03/17/2014    Procedure: ARTHROSCOPY LEFT KNEE WITH SYNOVECTOMY;  Surgeon: Gearlean Alf, MD;  [facility]: [facility];  Service: Orthopedics;  Laterality: Left;  . Total knee arthroplasty Right 04/11/2015    Procedure: RIGHT  TOTAL KNEE ARTHROPLASTY;  Surgeon: Gaynelle Arabian, MD;  Location: [facility];  Service: Orthopedics;  Laterality: Right;    There were no vitals filed for this visit.  Visit Diagnosis:  Right knee pain  Decreased ROM of right knee  Difficulty walking      Subjective Assessment - 04/26/15 1705    Subjective Saw MD today, he was pleased with my ROM.  I am very sore right now.     Currently in Pain? Yes   Pain Score 7    Pain Location Knee   Pain Orientation Right   Pain Descriptors / Indicators Aching   Pain Type Surgical pain                         [facility] Adult PT Treatment/Exercise - 04/26/15 0001    Ambulation/Gait   Gait Comments worked on gait iwth a SPC and without, some cues for safety and placement needed   Knee/Hip Exercises: Aerobic   Stationary Bike partial revs x 6 minutes   Isokinetic NuStep Level 4 x 5 minutes   Knee/Hip Exercises: Machines for Strengthening   Cybex Knee Flexion 25# 3x10   Knee/Hip Exercises: Seated   Long Arc Quad Right  able to do 10 reps with 3# and then 10 reps without weight   Other Seated Knee Exercises ankle pumps, ball b/n knees squeeze   Cryotherapy   Number Minutes Cryotherapy 15 Minutes   Cryotherapy Location Knee   Type of Cryotherapy Ice pack   Electrical Stimulation   Electrical Stimulation Location right knee   Electrical Stimulation Parameters IFC   Electrical Stimulation Goals Pain   Manual Therapy   Manual therapy comments some PROM and scar mobilization                  PT Short Term Goals - 04/19/15 1648    PT SHORT TERM GOAL #1   Title independent with initial HEP   Time 1   Period Weeks   Status New           PT Long Term Goals - 04/19/15 1648    PT LONG TERM GOAL #1   Title walk 500 feet without assistive device   Time 8   Period Weeks   Status New   PT LONG TERM GOAL #2   Title decrease pain 50%   Time 8   Period Weeks   Status New   PT LONG TERM GOAL #3   Title increase  AROM of the right knee to 10-110 degrees flexion   Time 8   Period Weeks   Status New   PT LONG TERM GOAL #4   Title be independent with RICE   Time 8   Period Weeks   Status New   PT LONG TERM GOAL #5   Title increase strength to 4+/5   Time 8   Period Weeks   Status New               Plan - 04/26/15 1756    Clinical Impression Statement Doing well with mobility, just very painful with any exercises, does not tolerate exercises or much PROM.   PT Next Visit Plan try to get her moving and tolerant of touch and motions   Consulted and Agree with Plan of Care Patient        Problem List Patient Active Problem List   Diagnosis Date Noted  . Synovitis of knee 03/16/2014  . Postop Hypokalemia 08/07/2012  . Postop Hyponatremia 08/06/2012  . OA (osteoarthritis) of knee 08/04/2012    Sumner Boast., PT 04/26/2015, 5:57 PM  [address] [place], [place], [address] Phone: [phone]   Fax:  [phone]

## 2015-04-28 ENCOUNTER — Other Ambulatory Visit: Payer: Self-pay | Admitting: Adult Health

## 2015-04-28 ENCOUNTER — Encounter: Payer: Self-pay | Admitting: Physical Therapy

## 2015-04-28 ENCOUNTER — Ambulatory Visit: Payer: Medicare Other | Attending: Orthopedic Surgery | Admitting: Physical Therapy

## 2015-04-28 DIAGNOSIS — R262 Difficulty in walking, not elsewhere classified: Secondary | ICD-10-CM | POA: Diagnosis present

## 2015-04-28 DIAGNOSIS — M25561 Pain in right knee: Secondary | ICD-10-CM | POA: Diagnosis not present

## 2015-04-28 DIAGNOSIS — R29898 Other symptoms and signs involving the musculoskeletal system: Secondary | ICD-10-CM | POA: Insufficient documentation

## 2015-04-28 DIAGNOSIS — M25661 Stiffness of right knee, not elsewhere classified: Secondary | ICD-10-CM

## 2015-04-28 NOTE — Therapy (Signed)
[place], [place], [address] Phone: [phone]   Fax:  [phone]  Physical Therapy Treatment  Patient Details  Name: Kiara Brown MRN: [id number] Date of Birth: 13-May-1941 Referring Provider:  Gaynelle Arabian, MD  Encounter Date: 04/28/2015      PT End of Session - 04/28/15 1625    Visit Number 4   Date for PT Re-Evaluation 06/19/15   PT Start Time 1618   PT Stop Time 1720   PT Time Calculation (min) 62 min      Past Medical History  Diagnosis Date  . Diverticulosis   . Hyperlipidemia   . H/O hiatal hernia   . Dysrhythmia     hx rapid heartbeat, no cardiologist  . GERD (gastroesophageal reflux disease)   . Fibromyalgia     peripheral neuropathy  . Sciatica   . Fibromyalgia   . PONV (postoperative nausea and vomiting)     "THROAT WITH EXTREME [id number]"0272, ulnar nerve surgery  . Arthritis     osteo, rheumatoid  . UTI (lower urinary tract infection)   . Shortness of breath dyspnea     with exertion    Past Surgical History  Procedure Laterality Date  . Rotator cuff repair      right  . Appendectomy    . Breast surgery      biopsy  . Cystoscopy with biopsy    . Abdominal hysterectomy    . Knee arthroscopy      bilateral  . Cardiac catheterization  2009    "no blockage per pt"  . Cholecystectomy    . Incontinence surgery    . Total knee arthroplasty  08/04/2012    Procedure: TOTAL KNEE ARTHROPLASTY;  Surgeon: Gearlean Alf, MD;  [facility]: [facility];  Service: Orthopedics;  Laterality: Left;  . Ulnar nerve surgery Right 1986  . Knee arthroscopy Left 03/17/2014    Procedure: ARTHROSCOPY LEFT KNEE WITH SYNOVECTOMY;  Surgeon: Gearlean Alf, MD;  [facility];  Service: Orthopedics;  Laterality: Left;  . Total knee arthroplasty Right 04/11/2015    Procedure: RIGHT TOTAL KNEE ARTHROPLASTY;  Surgeon: Gaynelle Arabian, MD;  Location: [facility];  Service: Orthopedics;  Laterality:  Right;    There were no vitals filed for this visit.  Visit Diagnosis:  Right knee pain  Decreased ROM of right knee      Subjective Assessment - 04/28/15 1619    Subjective very sore and tired,verb doing HEP    Currently in Pain? Yes   Pain Score 6    Pain Location Knee   Pain Orientation Right            [facility] PT Assessment - 04/28/15 0001    AROM   Overall AROM Comments 10-108                     [facility] Adult PT Treatment/Exercise - 04/28/15 0001    Exercises   Exercises Knee/Hip   Knee/Hip Exercises: Aerobic   Stationary Bike 6 min full rev backward  pt fatigued very easily   Isokinetic NuStep Level 5 x 6 minutes   Knee/Hip Exercises: Standing   Other Standing Knee Exercises RT leg 3# 10 reps marching, hip flex,ext,abd  with RW for support   Knee/Hip Exercises: Seated   Long Arc Quad Strengthening;Right;2 sets;10 reps  3 sec hold   Other Seated Knee Exercises HS curl green tband 2 sets 10  Knee/Hip Exercises: Supine   Other Supine Knee Exercises leg press 20# 2 sets 10   Other Supine Knee Exercises calf raises 20# 2 sets 10   Cryotherapy   Number Minutes Cryotherapy 15 Minutes   Cryotherapy Location Knee   Type of Cryotherapy Ice pack   Electrical Stimulation   Electrical Stimulation Location right knee   Electrical Stimulation Parameters IFC   Electrical Stimulation Goals Pain;Edema                  PT Short Term Goals - 04/28/15 1653    PT SHORT TERM GOAL #1   Title independent with initial HEP   Status Achieved           PT Long Term Goals - 04/28/15 1653    PT LONG TERM GOAL #1   Title walk 500 feet without assistive device   Status On-going   PT LONG TERM GOAL #2   Title decrease pain 50%   Status On-going   PT LONG TERM GOAL #3   Title increase AROM of the right knee to 10-110 degrees flexion   Status Partially Met   PT LONG TERM GOAL #4   Title be independent with RICE   Status Achieved   PT LONG TERM  GOAL #5   Title increase strength to 4+/5   Status On-going               Plan - 04/28/15 1701    Clinical Impression Statement pt is amb some without AD and doing well just slow.signicant increase in ROM and strength improving. pt with very low endurance and fatigues easily requiring freq rest breaks   PT Next Visit Plan STW to knee, gait without AD, strength        Problem List Patient Active Problem List   Diagnosis Date Noted  . Synovitis of knee 03/16/2014  . Postop Hypokalemia 08/07/2012  . Postop Hyponatremia 08/06/2012  . OA (osteoarthritis) of knee 08/04/2012    Avantae Bither,ANGIE PTA 04/28/2015, 5:04 PM  [place] [address] [address] [place], [place], [address] Phone: [phone]   Fax:  [phone]

## 2015-05-04 ENCOUNTER — Ambulatory Visit: Payer: Medicare Other | Admitting: Rehabilitation

## 2015-05-06 ENCOUNTER — Encounter: Payer: Self-pay | Admitting: Physical Therapy

## 2015-05-06 ENCOUNTER — Ambulatory Visit: Payer: Medicare Other | Admitting: Physical Therapy

## 2015-05-06 DIAGNOSIS — M25561 Pain in right knee: Secondary | ICD-10-CM | POA: Diagnosis not present

## 2015-05-06 DIAGNOSIS — M25661 Stiffness of right knee, not elsewhere classified: Secondary | ICD-10-CM

## 2015-05-06 DIAGNOSIS — R262 Difficulty in walking, not elsewhere classified: Secondary | ICD-10-CM

## 2015-05-06 NOTE — Therapy (Signed)
[facility] [place], [place], [address] Phone: [phone]   Fax:  [phone]  Physical Therapy Treatment  Patient Details  Name: Kiara Brown MRN: [id number] Date of Birth: January 09, 1941 Referring Provider:  Gaynelle Arabian, MD  Encounter Date: 05/06/2015      PT End of Session - 05/06/15 0915    Visit Number 5   PT Start Time 1324   PT Stop Time 0932   PT Time Calculation (min) 45 min      Past Medical History  Diagnosis Date  . Diverticulosis   . Hyperlipidemia   . H/O hiatal hernia   . Dysrhythmia     hx rapid heartbeat, no cardiologist  . GERD (gastroesophageal reflux disease)   . Fibromyalgia     peripheral neuropathy  . Sciatica   . Fibromyalgia   . PONV (postoperative nausea and vomiting)     "THROAT WITH EXTREME [id number]"5366, ulnar nerve surgery  . Arthritis     osteo, rheumatoid  . UTI (lower urinary tract infection)   . Shortness of breath dyspnea     with exertion    Past Surgical History  Procedure Laterality Date  . Rotator cuff repair      right  . Appendectomy    . Breast surgery      biopsy  . Cystoscopy with biopsy    . Abdominal hysterectomy    . Knee arthroscopy      bilateral  . Cardiac catheterization  2009    "no blockage per pt"  . Cholecystectomy    . Incontinence surgery    . Total knee arthroplasty  08/04/2012    Procedure: TOTAL KNEE ARTHROPLASTY;  Surgeon: Gearlean Alf, MD;  Location: [facility];  Service: Orthopedics;  Laterality: Left;  . Ulnar nerve surgery Right 1986  . Knee arthroscopy Left 03/17/2014    Procedure: ARTHROSCOPY LEFT KNEE WITH SYNOVECTOMY;  Surgeon: Gearlean Alf, MD;  Location: [facility];  Service: Orthopedics;  Laterality: Left;  . Total knee arthroplasty Right 04/11/2015    Procedure: RIGHT TOTAL KNEE ARTHROPLASTY;  Surgeon: Gaynelle Arabian, MD;  Location: [facility];  Service: Orthopedics;  Laterality: Right;    There were no vitals  filed for this visit.  Visit Diagnosis:  Right knee pain  Decreased ROM of right knee  Difficulty walking      Subjective Assessment - 05/06/15 0850    Subjective I think we overdid it last time, I was really sore for a long time after.   Currently in Pain? Yes   Pain Score 6    Pain Location Knee   Pain Orientation Right   Pain Descriptors / Indicators Aching   Aggravating Factors  bending   Pain Relieving Factors rest                         [facility] Adult PT Treatment/Exercise - 05/06/15 0001    Ambulation/Gait   Gait Comments gait without assistive device 200 feet   High Level Balance   High Level Balance Activities Side stepping;Backward walking;Marching forwards   Knee/Hip Exercises: Aerobic   Stationary Bike 6 min full rev backward   Isokinetic NuStep Level 5 x 6 minutes   Knee/Hip Exercises: Machines for Strengthening   Cybex Knee Flexion 25# 3x10   Knee/Hip Exercises: Seated   Long Arc Quad 3 sets;10 reps   Cryotherapy   Number Minutes Cryotherapy 15 Minutes  Cryotherapy Location Knee   Type of Cryotherapy Ice pack   Electrical Stimulation   Electrical Stimulation Location right knee   Electrical Stimulation Parameters IFC   Electrical Stimulation Goals Pain;Edema                  PT Short Term Goals - 04/28/15 1653    PT SHORT TERM GOAL #1   Title independent with initial HEP   Status Achieved           PT Long Term Goals - 05/06/15 0916    PT LONG TERM GOAL #2   Title decrease pain 50%   Status On-going               Plan - 05/06/15 0915    Clinical Impression Statement ROM is very good, her gait is good but she is very limited by pain, has some pain in the right buttock as well   PT Next Visit Plan may see 1x next week   Consulted and Agree with Plan of Care Patient        Problem List Patient Active Problem List   Diagnosis Date Noted  . Synovitis of knee 03/16/2014  . Postop Hypokalemia 08/07/2012   . Postop Hyponatremia 08/06/2012  . OA (osteoarthritis) of knee 08/04/2012    Sumner Boast., PT 05/06/2015, 9:17 AM  [address] [place], [place], [address] Phone: [phone]   Fax:  [phone]

## 2015-05-11 ENCOUNTER — Encounter: Payer: Self-pay | Admitting: Physical Therapy

## 2015-05-11 ENCOUNTER — Ambulatory Visit: Payer: Medicare Other | Admitting: Physical Therapy

## 2015-05-11 DIAGNOSIS — M25561 Pain in right knee: Secondary | ICD-10-CM | POA: Diagnosis not present

## 2015-05-11 DIAGNOSIS — R262 Difficulty in walking, not elsewhere classified: Secondary | ICD-10-CM

## 2015-05-11 DIAGNOSIS — M25661 Stiffness of right knee, not elsewhere classified: Secondary | ICD-10-CM

## 2015-05-11 NOTE — Therapy (Signed)
[place], [place], [address] Phone: [phone]   Fax:  [phone]  Physical Therapy Treatment  Patient Details  Name: Kiara Brown MRN: [id number] Date of Birth: 12-02-1940 Referring Provider:  Gaynelle Arabian, MD  Encounter Date: 05/11/2015      PT End of Session - 05/11/15 1512    Visit Number 6   PT Start Time 6962   PT Stop Time 1500   PT Time Calculation (min) 53 min   Activity Tolerance Patient limited by pain   Behavior During Therapy Anxious;Agitated      Past Medical History  Diagnosis Date  . Diverticulosis   . Hyperlipidemia   . H/O hiatal hernia   . Dysrhythmia     hx rapid heartbeat, no cardiologist  . GERD (gastroesophageal reflux disease)   . Fibromyalgia     peripheral neuropathy  . Sciatica   . Fibromyalgia   . PONV (postoperative nausea and vomiting)     "THROAT WITH EXTREME [id number]"2440, ulnar nerve surgery  . Arthritis     osteo, rheumatoid  . UTI (lower urinary tract infection)   . Shortness of breath dyspnea     with exertion    Past Surgical History  Procedure Laterality Date  . Rotator cuff repair      right  . Appendectomy    . Breast surgery      biopsy  . Cystoscopy with biopsy    . Abdominal hysterectomy    . Knee arthroscopy      bilateral  . Cardiac catheterization  2009    "no blockage per pt"  . Cholecystectomy    . Incontinence surgery    . Total knee arthroplasty  08/04/2012    Procedure: TOTAL KNEE ARTHROPLASTY;  Surgeon: Gearlean Alf, MD;  Location: [facility];  Service: Orthopedics;  Laterality: Left;  . Ulnar nerve surgery Right 1986  . Knee arthroscopy Left 03/17/2014    Procedure: ARTHROSCOPY LEFT KNEE WITH SYNOVECTOMY;  Surgeon: Gearlean Alf, MD;  Location: [facility];  Service: Orthopedics;  Laterality: Left;  . Total knee arthroplasty Right 04/11/2015    Procedure: RIGHT TOTAL KNEE ARTHROPLASTY;  Surgeon: Gaynelle Arabian, MD;   Location: [facility];  Service: Orthopedics;  Laterality: Right;    There were no vitals filed for this visit.  Visit Diagnosis:  Right knee pain  Decreased ROM of right knee  Difficulty walking      Subjective Assessment - 05/11/15 1409    Subjective Doing well but pain is still a 6/10 at most times.   Currently in Pain? Yes   Pain Score 6    Pain Location Knee   Pain Orientation Right   Pain Descriptors / Indicators Aching   Pain Type Surgical pain   Aggravating Factors  walking and doing things pain is up to 8/10   Pain Relieving Factors reports that with ice and some pain meds the pain will get down to a 2/10   Effect of Pain on Daily Activities can't sleep very well due to pain            [facility] PT Assessment - 05/11/15 0001    AROM   Overall AROM Comments 5-110 degrees flexion   Palpation   Palpation comment very tender, even to light touch along the knee, there is no increase of temperature noted, no pain in the calf, just hypersensitive to touch  [facility] Adult PT Treatment/Exercise - 05/11/15 0001    Knee/Hip Exercises: Aerobic   Stationary Bike 6 min full rev backward   Isokinetic NuStep Level 5 x 6 minutes   Cryotherapy   Number Minutes Cryotherapy 15 Minutes   Cryotherapy Location Knee   Type of Cryotherapy Ice pack   Electrical Stimulation   Electrical Stimulation Location right knee   Electrical Stimulation Parameters IFC   Electrical Stimulation Goals Edema;Pain   Manual Therapy   Manual therapy comments PROM. scar mobilization, gentle desensitization techniques, tried joint distraction                  PT Short Term Goals - 04/28/15 1653    PT SHORT TERM GOAL #1   Title independent with initial HEP   Status Achieved           PT Long Term Goals - 05/11/15 1602    PT LONG TERM GOAL #1   Title walk 500 feet without assistive device   Status Achieved   PT LONG TERM GOAL #2   Title decrease pain 50%    Status Not Met               Plan - 05/11/15 1527    Clinical Impression Statement Patient with hands shaking today.  Seemed to be more anxious and was worried about pain.  She is very tender around the knee even to light touch.  Continues to hvae a high rating of pain   PT Next Visit Plan write MD note next visit   Consulted and Agree with Plan of Care Patient        Problem List Patient Active Problem List   Diagnosis Date Noted  . Synovitis of knee 03/16/2014  . Postop Hypokalemia 08/07/2012  . Postop Hyponatremia 08/06/2012  . OA (osteoarthritis) of knee 08/04/2012    Sumner Boast., PT 05/11/2015, 4:03 PM  [address] [place], [place], [address] Phone: [phone]   Fax:  [phone]

## 2015-05-13 ENCOUNTER — Ambulatory Visit: Payer: Medicare Other | Admitting: Physical Therapy

## 2015-05-16 ENCOUNTER — Encounter: Payer: Self-pay | Admitting: Physical Therapy

## 2015-05-16 ENCOUNTER — Ambulatory Visit: Payer: Medicare Other | Admitting: Physical Therapy

## 2015-05-16 DIAGNOSIS — M25561 Pain in right knee: Secondary | ICD-10-CM | POA: Diagnosis not present

## 2015-05-16 DIAGNOSIS — M25661 Stiffness of right knee, not elsewhere classified: Secondary | ICD-10-CM

## 2015-05-16 DIAGNOSIS — R262 Difficulty in walking, not elsewhere classified: Secondary | ICD-10-CM

## 2015-05-16 NOTE — Therapy (Signed)
[place], [place], [address] Phone: [phone]   Fax:  [phone]  Physical Therapy Treatment  Patient Details  Name: Kiara Brown MRN: [id number] Date of Birth: 09-13-1941 Referring Provider:  Gaynelle Arabian, MD  Encounter Date: 05/16/2015      PT End of Session - 05/16/15 1340    Visit Number 7   PT Start Time 0600   PT Stop Time 1415   PT Time Calculation (min) 58 min   Activity Tolerance Patient limited by pain      Past Medical History  Diagnosis Date  . Diverticulosis   . Hyperlipidemia   . H/O hiatal hernia   . Dysrhythmia     hx rapid heartbeat, no cardiologist  . GERD (gastroesophageal reflux disease)   . Fibromyalgia     peripheral neuropathy  . Sciatica   . Fibromyalgia   . PONV (postoperative nausea and vomiting)     "THROAT WITH EXTREME [id number]"1423, ulnar nerve surgery  . Arthritis     osteo, rheumatoid  . UTI (lower urinary tract infection)   . Shortness of breath dyspnea     with exertion    Past Surgical History  Procedure Laterality Date  . Rotator cuff repair      right  . Appendectomy    . Breast surgery      biopsy  . Cystoscopy with biopsy    . Abdominal hysterectomy    . Knee arthroscopy      bilateral  . Cardiac catheterization  2009    "no blockage per pt"  . Cholecystectomy    . Incontinence surgery    . Total knee arthroplasty  08/04/2012    Procedure: TOTAL KNEE ARTHROPLASTY;  Surgeon: Gearlean Alf, MD;  Location: [facility];  Service: Orthopedics;  Laterality: Left;  . Ulnar nerve surgery Right 1986  . Knee arthroscopy Left 03/17/2014    Procedure: ARTHROSCOPY LEFT KNEE WITH SYNOVECTOMY;  Surgeon: Gearlean Alf, MD;  Location: [facility];  Service: Orthopedics;  Laterality: Left;  . Total knee arthroplasty Right 04/11/2015    Procedure: RIGHT TOTAL KNEE ARTHROPLASTY;  Surgeon: Gaynelle Arabian, MD;  Location: [facility];  Service: Orthopedics;   Laterality: Right;    There were no vitals filed for this visit.  Visit Diagnosis:  Right knee pain  Decreased ROM of right knee  Difficulty walking      Subjective Assessment - 05/16/15 1321    Subjective Patient continues to c/o pain a 6/10 with any activity, reports no changes with any medications.  She does report that with RICE.   Currently in Pain? Yes   Pain Score 6    Pain Location Knee   Pain Orientation Right   Pain Descriptors / Indicators Aching   Pain Type Surgical pain   Aggravating Factors  any activity causes pain to be 6/10   Pain Relieving Factors RICE            [facility] PT Assessment - 05/16/15 0001    AROM   Overall AROM Comments 5-110 degrees flexion   Palpation   Patella mobility Patella seems very tight, however it is hard to assess secondary to her being so sensitive to palpation in this area                     [facility] Adult PT Treatment/Exercise - 05/16/15 0001    Knee/Hip Exercises: Aerobic   Stationary Bike 6  min full rev backward and forward   Isokinetic NuStep L4vel 5 x 6 minutes   Cryotherapy   Number Minutes Cryotherapy 15 Minutes   Cryotherapy Location Knee   Type of Cryotherapy Ice pack   Electrical Stimulation   Electrical Stimulation Location right knee   Electrical Stimulation Parameters IFC   Electrical Stimulation Goals Edema;Pain   Manual Therapy   Manual Therapy Soft tissue mobilization;Joint mobilization;Passive ROM   Manual therapy comments PROM. scar mobilization, gentle desensitization techniques, tried joint distraction                  PT Short Term Goals - 04/28/15 1653    PT SHORT TERM GOAL #1   Title independent with initial HEP   Status Achieved           PT Long Term Goals - 05/11/15 1602    PT LONG TERM GOAL #1   Title walk 500 feet without assistive device   Status Achieved   PT LONG TERM GOAL #2   Title decrease pain 50%   Status Not Met               Plan -  05/16/15 1340    Clinical Impression Statement Patient continues to have high rating of pain with any activity, over all her ROM and gait have greatly improved pain is the biggest.   PT Next Visit Plan Sent note to MD, will see if they want [place] to continue with treatment   Consulted and Agree with Plan of Care Patient        Problem List Patient Active Problem List   Diagnosis Date Noted  . Synovitis of knee 03/16/2014  . Postop Hypokalemia 08/07/2012  . Postop Hyponatremia 08/06/2012  . OA (osteoarthritis) of knee 08/04/2012    Sumner Boast., PT 05/16/2015, 1:53 PM  [address] [place], [place], [address] Phone: [phone]   Fax:  [phone]

## 2015-12-19 DIAGNOSIS — M0609 Rheumatoid arthritis without rheumatoid factor, multiple sites: Secondary | ICD-10-CM | POA: Diagnosis not present

## 2015-12-26 DIAGNOSIS — M0609 Rheumatoid arthritis without rheumatoid factor, multiple sites: Secondary | ICD-10-CM | POA: Diagnosis not present

## 2015-12-26 DIAGNOSIS — M7582 Other shoulder lesions, left shoulder: Secondary | ICD-10-CM | POA: Diagnosis not present

## 2015-12-26 DIAGNOSIS — Z79899 Other long term (current) drug therapy: Secondary | ICD-10-CM | POA: Diagnosis not present

## 2015-12-26 DIAGNOSIS — G2581 Restless legs syndrome: Secondary | ICD-10-CM | POA: Diagnosis not present

## 2016-01-02 DIAGNOSIS — Z96651 Presence of right artificial knee joint: Secondary | ICD-10-CM | POA: Diagnosis not present

## 2016-01-02 DIAGNOSIS — Z471 Aftercare following joint replacement surgery: Secondary | ICD-10-CM | POA: Diagnosis not present

## 2016-01-16 DIAGNOSIS — R5383 Other fatigue: Secondary | ICD-10-CM | POA: Diagnosis not present

## 2016-01-16 DIAGNOSIS — M0609 Rheumatoid arthritis without rheumatoid factor, multiple sites: Secondary | ICD-10-CM | POA: Diagnosis not present

## 2016-01-16 DIAGNOSIS — Z79899 Other long term (current) drug therapy: Secondary | ICD-10-CM | POA: Diagnosis not present

## 2016-02-07 DIAGNOSIS — F5101 Primary insomnia: Secondary | ICD-10-CM | POA: Diagnosis not present

## 2016-02-07 DIAGNOSIS — M0579 Rheumatoid arthritis with rheumatoid factor of multiple sites without organ or systems involvement: Secondary | ICD-10-CM | POA: Diagnosis not present

## 2016-02-07 DIAGNOSIS — Z6841 Body Mass Index (BMI) 40.0 and over, adult: Secondary | ICD-10-CM | POA: Diagnosis not present

## 2016-02-14 DIAGNOSIS — M0609 Rheumatoid arthritis without rheumatoid factor, multiple sites: Secondary | ICD-10-CM | POA: Diagnosis not present

## 2016-03-13 DIAGNOSIS — Z79899 Other long term (current) drug therapy: Secondary | ICD-10-CM | POA: Diagnosis not present

## 2016-03-13 DIAGNOSIS — M0579 Rheumatoid arthritis with rheumatoid factor of multiple sites without organ or systems involvement: Secondary | ICD-10-CM | POA: Diagnosis not present

## 2016-03-13 DIAGNOSIS — M0609 Rheumatoid arthritis without rheumatoid factor, multiple sites: Secondary | ICD-10-CM | POA: Diagnosis not present

## 2016-04-10 DIAGNOSIS — Z1231 Encounter for screening mammogram for malignant neoplasm of breast: Secondary | ICD-10-CM | POA: Diagnosis not present

## 2016-04-11 DIAGNOSIS — Z79899 Other long term (current) drug therapy: Secondary | ICD-10-CM | POA: Diagnosis not present

## 2016-04-11 DIAGNOSIS — M0609 Rheumatoid arthritis without rheumatoid factor, multiple sites: Secondary | ICD-10-CM | POA: Diagnosis not present

## 2016-05-10 DIAGNOSIS — M0609 Rheumatoid arthritis without rheumatoid factor, multiple sites: Secondary | ICD-10-CM | POA: Diagnosis not present

## 2016-05-28 ENCOUNTER — Emergency Department (HOSPITAL_BASED_OUTPATIENT_CLINIC_OR_DEPARTMENT_OTHER)
Admission: EM | Admit: 2016-05-28 | Discharge: 2016-05-28 | Disposition: A | Discharge status: Home / Self Care | Payer: Medicare Other | Attending: Emergency Medicine | Admitting: Emergency Medicine

## 2016-05-28 ENCOUNTER — Encounter (HOSPITAL_BASED_OUTPATIENT_CLINIC_OR_DEPARTMENT_OTHER): Payer: Self-pay | Admitting: *Deleted

## 2016-05-28 ENCOUNTER — Emergency Department (HOSPITAL_BASED_OUTPATIENT_CLINIC_OR_DEPARTMENT_OTHER): Payer: Medicare Other

## 2016-05-28 DIAGNOSIS — M1990 Unspecified osteoarthritis, unspecified site: Secondary | ICD-10-CM | POA: Diagnosis not present

## 2016-05-28 DIAGNOSIS — Y939 Activity, unspecified: Secondary | ICD-10-CM | POA: Insufficient documentation

## 2016-05-28 DIAGNOSIS — W228XXA Striking against or struck by other objects, initial encounter: Secondary | ICD-10-CM | POA: Diagnosis not present

## 2016-05-28 DIAGNOSIS — Z791 Long term (current) use of non-steroidal anti-inflammatories (NSAID): Secondary | ICD-10-CM | POA: Insufficient documentation

## 2016-05-28 DIAGNOSIS — Y929 Unspecified place or not applicable: Secondary | ICD-10-CM | POA: Diagnosis not present

## 2016-05-28 DIAGNOSIS — Z79899 Other long term (current) drug therapy: Secondary | ICD-10-CM | POA: Insufficient documentation

## 2016-05-28 DIAGNOSIS — S99922A Unspecified injury of left foot, initial encounter: Secondary | ICD-10-CM | POA: Diagnosis present

## 2016-05-28 DIAGNOSIS — S92512A Displaced fracture of proximal phalanx of left lesser toe(s), initial encounter for closed fracture: Secondary | ICD-10-CM | POA: Diagnosis not present

## 2016-05-28 DIAGNOSIS — Y999 Unspecified external cause status: Secondary | ICD-10-CM | POA: Diagnosis not present

## 2016-05-28 DIAGNOSIS — S92912A Unspecified fracture of left toe(s), initial encounter for closed fracture: Secondary | ICD-10-CM

## 2016-05-28 DIAGNOSIS — E785 Hyperlipidemia, unspecified: Secondary | ICD-10-CM | POA: Diagnosis not present

## 2016-05-28 NOTE — ED Notes (Signed)
She kicked something last night. Bruising and pain to her left foot.

## 2016-05-28 NOTE — ED Provider Notes (Signed)
CSN: [id number]     Arrival date & time 05/28/16  2140 History  By signing my name below, I, Dyke Brackett, attest that this documentation has been prepared under the direction and in the presence of Blanchie Dessert, MD . Electronically Signed: Dyke Brackett, Scribe. 05/28/2016. 10:33 PM.   Chief Complaint  Patient presents with  . Foot Injury   The history is provided by the patient. No language interpreter was used.  HPI Comments:  Kiara Brown is a 75 y.o. female who presents to the Emergency Department complaining of sudden onset, moderate left fifth toe pain onset last night. She states she kicked a door jam last night. She has associated erythema and bruising on left fifth toe. Pt states she was concerned that she had blood poisoning due to the redness of her toe.  No alleviating factors noted. No other complaints at this time.  Past Medical History  Diagnosis Date  . Diverticulosis   . Hyperlipidemia   . H/O hiatal hernia   . Dysrhythmia     hx rapid heartbeat, no cardiologist  . GERD (gastroesophageal reflux disease)   . Fibromyalgia     peripheral neuropathy  . Sciatica   . Fibromyalgia   . PONV (postoperative nausea and vomiting)     "THROAT WITH EXTREME IF:[id number], ulnar nerve surgery  . Arthritis     osteo, rheumatoid  . UTI (lower urinary tract infection)   . Shortness of breath dyspnea     with exertion   Past Surgical History  Procedure Laterality Date  . Rotator cuff repair      right  . Appendectomy    . Breast surgery      biopsy  . Cystoscopy with biopsy    . Abdominal hysterectomy    . Knee arthroscopy      bilateral  . Cardiac catheterization  2009    "no blockage per pt"  . Cholecystectomy    . Incontinence surgery    . Total knee arthroplasty  08/04/2012    Procedure: TOTAL KNEE ARTHROPLASTY;  Surgeon: Gearlean Alf, MD;  Location: [facility];  Service: Orthopedics;  Laterality: Left;  . Ulnar nerve surgery Right 1986  . Knee arthroscopy Left  03/17/2014    Procedure: ARTHROSCOPY LEFT KNEE WITH SYNOVECTOMY;  Surgeon: Gearlean Alf, MD;  Location: [facility];  Service: Orthopedics;  Laterality: Left;  . Total knee arthroplasty Right 04/11/2015    Procedure: RIGHT TOTAL KNEE ARTHROPLASTY;  Surgeon: Gaynelle Arabian, MD;  Location: [facility];  Service: Orthopedics;  Laterality: Right;   Family History  Problem Relation Age of Onset  . Hypertension Mother   . Stroke Mother   . Hypertension Father     MI  . Hypertension Brother     MI  . Hypertension Brother     MI  . Hypertension Brother     MI   Social History  Substance Use Topics  . Smoking status: Never Smoker   . Smokeless tobacco: Never Used  . Alcohol Use: No   OB History    No data available     Review of Systems  Musculoskeletal: Positive for myalgias and arthralgias.  Skin: Positive for color change.  All other systems reviewed and are negative.   Allergies  Review of patient's allergies indicates no known allergies.  Home Medications   Prior to Admission medications   Medication Sig Start Date End Date Taking? Authorizing Provider  acetaminophen (TYLENOL) 500 MG tablet Take 500 mg by  mouth every 6 (six) hours as needed (Pain). Pain   Yes Historical Provider, MD  atenolol (TENORMIN) 100 MG tablet Take 100 mg by mouth daily after breakfast.   Yes Historical Provider, MD  bisacodyl (DULCOLAX) 10 MG suppository Place 1 suppository (10 mg total) rectally daily as needed for moderate constipation. 04/12/15  Yes Arlee Muslim, PA-C  buPROPion (WELLBUTRIN XL) 150 MG 24 hr tablet Take 150 mg by mouth daily before breakfast.   Yes Historical Provider, MD  docusate sodium (COLACE) 100 MG capsule Take 1 capsule (100 mg total) by mouth 2 (two) times daily. 04/12/15  Yes Arlee Muslim, PA-C  folic acid (FOLVITE) 1 MG tablet Take 1 mg by mouth daily.   Yes Historical Provider, MD  omeprazole (PRILOSEC) 40 MG capsule Take 40 mg by mouth every morning.   Yes Historical Provider, MD   traMADol (ULTRAM) 50 MG tablet Take 1-2 tablets (50-100 mg total) by mouth every 6 (six) hours as needed (mild pain). 04/12/15  Yes Arlee Muslim, PA-C  DULoxetine (CYMBALTA) 30 MG capsule Take 30 mg by mouth daily.    Historical Provider, MD  methocarbamol (ROBAXIN) 500 MG tablet Take 1 tablet (500 mg total) by mouth every 6 (six) hours as needed for muscle spasms. 04/12/15   Arlee Muslim, PA-C  metoCLOPramide (REGLAN) 5 MG tablet Take 1-2 tablets (5-10 mg total) by mouth every 8 (eight) hours as needed for nausea (if ondansetron (ZOFRAN) ineffective.). 04/12/15   Arlee Muslim, PA-C  ondansetron (ZOFRAN) 4 MG tablet Take 1 tablet (4 mg total) by mouth every 6 (six) hours as needed for nausea. 04/12/15   Arlee Muslim, PA-C  oxyCODONE (OXY IR/ROXICODONE) 5 MG immediate release tablet Take 1-2 tablets (5-10 mg total) by mouth every 3 (three) hours as needed for moderate pain, severe pain or breakthrough pain. 04/12/15   Arlee Muslim, PA-C  polyethylene glycol [facility] / GLYCOLAX) packet Take 17 g by mouth daily as needed for mild constipation. 04/12/15   Arlee Muslim, PA-C  rivaroxaban (XARELTO) 10 MG TABS tablet Take 1 tablet (10 mg total) by mouth daily with breakfast. Take Xarelto for two and a half more weeks, then discontinue Xarelto. Once the patient has completed the Xarelto, they may resume the 81 mg Aspirin. 04/12/15   Arlee Muslim, PA-C   BP 164/82 mmHg  Pulse 70  Temp(Src) 98.6 F (37 C) (Oral)  Resp 20  Ht 5\' 3"  (1.6 m)  Wt 325 lb (147.419 kg)  BMI 57.59 kg/m2  SpO2 98% Physical Exam  Constitutional: She is oriented to person, place, and time. She appears well-developed and well-nourished. No distress.  HENT:  Head: Normocephalic and atraumatic.  Right Ear: Hearing normal.  Left Ear: Hearing normal.  Nose: Nose normal.  Mouth/Throat: Oropharynx is clear and moist and mucous membranes are normal.  Eyes: Conjunctivae and EOM are normal. Pupils are equal, round, and reactive to light.   Neck: Normal range of motion. Neck supple.  Cardiovascular: Normal rate, regular rhythm, S1 normal and S2 normal.  Exam reveals no gallop and no friction rub.   No murmur heard. Pulmonary/Chest: Effort normal and breath sounds normal. No respiratory distress. She exhibits no tenderness.  Abdominal: Soft. Normal appearance and bowel sounds are normal. She exhibits no distension. There is no hepatosplenomegaly. There is no tenderness. There is no rebound, no guarding, no tenderness at McBurney's point and negative Murphy's sign. No hernia.  Musculoskeletal: Normal range of motion. She exhibits tenderness.  Left foot has pain, swelling, ecchymosis  over left 5th digit. No ankle tenderness.  Neurological: She is alert and oriented to person, place, and time. She has normal strength. No cranial nerve deficit or sensory deficit. Coordination normal. GCS eye subscore is 4. GCS verbal subscore is 5. GCS motor subscore is 6.  Skin: Skin is warm, dry and intact. No rash noted. No cyanosis.  Psychiatric: She has a normal mood and affect. Her speech is normal and behavior is normal. Thought content normal.  Nursing note and vitals reviewed.   ED Course  Procedures  DIAGNOSTIC STUDIES:  Oxygen Saturation is 98% on RA, normal by my interpretation.    COORDINATION OF CARE:  10:25 PM Discussed treatment plan with pt at bedside and pt agreed to plan.  Labs Review Labs Reviewed - No data to display  Imaging Review Dg Foot Complete Left  05/28/2016  CLINICAL DATA:  Stubbed left fifth toe, with bruising and swelling. Initial encounter. EXAM: LEFT FOOT - COMPLETE 3+ VIEW COMPARISON:  None. FINDINGS: There is a mildly displaced fracture through the proximal aspect of the fifth proximal phalanx. No additional fractures are seen. Mild surrounding soft tissue swelling is noted. Visualized joint spaces are grossly unremarkable in appearance. The subtalar joint is unremarkable. A plantar calcaneal spur is seen.  Diffuse dorsal soft tissue swelling is noted along the forefoot and hindfoot. IMPRESSION: Mildly displaced fracture through the proximal aspect of the fifth proximal phalanx, with mild surrounding soft tissue swelling. Diffuse dorsal soft tissue swelling along the forefoot and hindfoot. Electronically Signed   By: Garald Balding M.D.   On: 05/28/2016 22:17   I have personally reviewed and evaluated these images and lab results as part of my medical decision-making.   EKG Interpretation None      MDM   Final diagnoses:  Toe fracture, left, closed, initial encounter    Patient with an injury to the left fifth toe yesterday while hitting it on a door jam. It has continued to Bruce and swell and she was concerned. No other signs of injury at this time. No skin breakdown or signs of infection. X-rays consistent with mildly displaced fracture through the proximal aspect of the fifth proximal phalanx. Findings discussed with the patient. Instructed her to wear a good shoe into take Tylenol as needed.  I personally performed the services described in this documentation, which was scribed in my presence.  The recorded information has been reviewed and considered.    Blanchie Dessert, MD 05/28/16 [phone]

## 2016-06-07 DIAGNOSIS — M0609 Rheumatoid arthritis without rheumatoid factor, multiple sites: Secondary | ICD-10-CM | POA: Diagnosis not present

## 2016-06-28 DIAGNOSIS — H2513 Age-related nuclear cataract, bilateral: Secondary | ICD-10-CM | POA: Diagnosis not present

## 2016-07-09 DIAGNOSIS — M0609 Rheumatoid arthritis without rheumatoid factor, multiple sites: Secondary | ICD-10-CM | POA: Diagnosis not present

## 2016-07-30 DIAGNOSIS — N3001 Acute cystitis with hematuria: Secondary | ICD-10-CM | POA: Diagnosis not present

## 2016-07-30 DIAGNOSIS — Z8719 Personal history of other diseases of the digestive system: Secondary | ICD-10-CM | POA: Diagnosis not present

## 2016-07-30 DIAGNOSIS — R1032 Left lower quadrant pain: Secondary | ICD-10-CM | POA: Diagnosis not present

## 2016-08-09 DIAGNOSIS — F5101 Primary insomnia: Secondary | ICD-10-CM | POA: Diagnosis not present

## 2016-08-09 DIAGNOSIS — Z23 Encounter for immunization: Secondary | ICD-10-CM | POA: Diagnosis not present

## 2016-08-09 DIAGNOSIS — R1032 Left lower quadrant pain: Secondary | ICD-10-CM | POA: Diagnosis not present

## 2016-08-09 DIAGNOSIS — R0789 Other chest pain: Secondary | ICD-10-CM | POA: Diagnosis not present

## 2016-08-09 DIAGNOSIS — I509 Heart failure, unspecified: Secondary | ICD-10-CM | POA: Diagnosis not present

## 2016-08-09 DIAGNOSIS — M1990 Unspecified osteoarthritis, unspecified site: Secondary | ICD-10-CM | POA: Diagnosis not present

## 2016-08-09 DIAGNOSIS — M0579 Rheumatoid arthritis with rheumatoid factor of multiple sites without organ or systems involvement: Secondary | ICD-10-CM | POA: Diagnosis not present

## 2016-08-13 DIAGNOSIS — R319 Hematuria, unspecified: Secondary | ICD-10-CM | POA: Diagnosis not present

## 2016-08-13 DIAGNOSIS — R109 Unspecified abdominal pain: Secondary | ICD-10-CM | POA: Diagnosis not present

## 2016-08-13 DIAGNOSIS — Z6839 Body mass index (BMI) 39.0-39.9, adult: Secondary | ICD-10-CM | POA: Diagnosis not present

## 2016-08-23 DIAGNOSIS — Z79899 Other long term (current) drug therapy: Secondary | ICD-10-CM | POA: Diagnosis not present

## 2016-08-23 DIAGNOSIS — M0609 Rheumatoid arthritis without rheumatoid factor, multiple sites: Secondary | ICD-10-CM | POA: Diagnosis not present

## 2016-08-23 DIAGNOSIS — G609 Hereditary and idiopathic neuropathy, unspecified: Secondary | ICD-10-CM | POA: Diagnosis not present

## 2016-08-29 DIAGNOSIS — I10 Essential (primary) hypertension: Secondary | ICD-10-CM | POA: Diagnosis not present

## 2016-08-29 DIAGNOSIS — R000 Tachycardia, unspecified: Secondary | ICD-10-CM | POA: Diagnosis not present

## 2016-09-20 DIAGNOSIS — M0609 Rheumatoid arthritis without rheumatoid factor, multiple sites: Secondary | ICD-10-CM | POA: Diagnosis not present

## 2016-10-08 DIAGNOSIS — T83711S Erosion of implanted vaginal mesh and other prosthetic materials to surrounding organ or tissue, sequela: Secondary | ICD-10-CM | POA: Diagnosis not present

## 2016-10-08 DIAGNOSIS — Z6841 Body Mass Index (BMI) 40.0 and over, adult: Secondary | ICD-10-CM | POA: Diagnosis not present

## 2016-10-23 DIAGNOSIS — M0609 Rheumatoid arthritis without rheumatoid factor, multiple sites: Secondary | ICD-10-CM | POA: Diagnosis not present

## 2016-10-23 DIAGNOSIS — M0600 Rheumatoid arthritis without rheumatoid factor, unspecified site: Secondary | ICD-10-CM | POA: Diagnosis not present

## 2016-10-23 DIAGNOSIS — Z79899 Other long term (current) drug therapy: Secondary | ICD-10-CM | POA: Diagnosis not present

## 2016-11-20 DIAGNOSIS — M0609 Rheumatoid arthritis without rheumatoid factor, multiple sites: Secondary | ICD-10-CM | POA: Diagnosis not present

## 2016-12-24 DIAGNOSIS — M159 Polyosteoarthritis, unspecified: Secondary | ICD-10-CM | POA: Diagnosis not present

## 2016-12-24 DIAGNOSIS — M0609 Rheumatoid arthritis without rheumatoid factor, multiple sites: Secondary | ICD-10-CM | POA: Diagnosis not present

## 2016-12-24 DIAGNOSIS — Z79899 Other long term (current) drug therapy: Secondary | ICD-10-CM | POA: Diagnosis not present

## 2016-12-27 DIAGNOSIS — Z96651 Presence of right artificial knee joint: Secondary | ICD-10-CM | POA: Diagnosis not present

## 2016-12-27 DIAGNOSIS — Z471 Aftercare following joint replacement surgery: Secondary | ICD-10-CM | POA: Diagnosis not present

## 2016-12-27 DIAGNOSIS — M25552 Pain in left hip: Secondary | ICD-10-CM | POA: Diagnosis not present

## 2016-12-28 DIAGNOSIS — M25551 Pain in right hip: Secondary | ICD-10-CM | POA: Diagnosis not present

## 2017-01-22 DIAGNOSIS — Z79899 Other long term (current) drug therapy: Secondary | ICD-10-CM | POA: Diagnosis not present

## 2017-01-22 DIAGNOSIS — M0609 Rheumatoid arthritis without rheumatoid factor, multiple sites: Secondary | ICD-10-CM | POA: Diagnosis not present

## 2017-02-14 DIAGNOSIS — Z0000 Encounter for general adult medical examination without abnormal findings: Secondary | ICD-10-CM | POA: Diagnosis not present

## 2017-02-14 DIAGNOSIS — M0579 Rheumatoid arthritis with rheumatoid factor of multiple sites without organ or systems involvement: Secondary | ICD-10-CM | POA: Diagnosis not present

## 2017-02-14 DIAGNOSIS — I10 Essential (primary) hypertension: Secondary | ICD-10-CM | POA: Diagnosis not present

## 2017-02-14 DIAGNOSIS — G4701 Insomnia due to medical condition: Secondary | ICD-10-CM | POA: Diagnosis not present

## 2017-02-21 DIAGNOSIS — Z5111 Encounter for antineoplastic chemotherapy: Secondary | ICD-10-CM | POA: Diagnosis not present

## 2017-02-21 DIAGNOSIS — M0609 Rheumatoid arthritis without rheumatoid factor, multiple sites: Secondary | ICD-10-CM | POA: Diagnosis not present

## 2017-03-28 DIAGNOSIS — M0609 Rheumatoid arthritis without rheumatoid factor, multiple sites: Secondary | ICD-10-CM | POA: Diagnosis not present

## 2017-04-02 DIAGNOSIS — H2513 Age-related nuclear cataract, bilateral: Secondary | ICD-10-CM | POA: Diagnosis not present

## 2017-04-29 DIAGNOSIS — Z79899 Other long term (current) drug therapy: Secondary | ICD-10-CM | POA: Diagnosis not present

## 2017-04-29 DIAGNOSIS — M0609 Rheumatoid arthritis without rheumatoid factor, multiple sites: Secondary | ICD-10-CM | POA: Diagnosis not present

## 2017-05-20 DIAGNOSIS — M7989 Other specified soft tissue disorders: Secondary | ICD-10-CM | POA: Diagnosis not present

## 2017-05-20 DIAGNOSIS — M7732 Calcaneal spur, left foot: Secondary | ICD-10-CM | POA: Diagnosis not present

## 2017-05-20 DIAGNOSIS — M5136 Other intervertebral disc degeneration, lumbar region: Secondary | ICD-10-CM | POA: Diagnosis not present

## 2017-05-20 DIAGNOSIS — M5137 Other intervertebral disc degeneration, lumbosacral region: Secondary | ICD-10-CM | POA: Diagnosis not present

## 2017-05-20 DIAGNOSIS — M7731 Calcaneal spur, right foot: Secondary | ICD-10-CM | POA: Diagnosis not present

## 2017-05-20 DIAGNOSIS — M19071 Primary osteoarthritis, right ankle and foot: Secondary | ICD-10-CM | POA: Diagnosis not present

## 2017-05-20 DIAGNOSIS — M79672 Pain in left foot: Secondary | ICD-10-CM | POA: Diagnosis not present

## 2017-05-20 DIAGNOSIS — G8929 Other chronic pain: Secondary | ICD-10-CM | POA: Diagnosis not present

## 2017-05-20 DIAGNOSIS — M47819 Spondylosis without myelopathy or radiculopathy, site unspecified: Secondary | ICD-10-CM | POA: Diagnosis not present

## 2017-05-20 DIAGNOSIS — M47897 Other spondylosis, lumbosacral region: Secondary | ICD-10-CM | POA: Diagnosis not present

## 2017-05-20 DIAGNOSIS — Z79899 Other long term (current) drug therapy: Secondary | ICD-10-CM | POA: Diagnosis not present

## 2017-05-20 DIAGNOSIS — M19072 Primary osteoarthritis, left ankle and foot: Secondary | ICD-10-CM | POA: Diagnosis not present

## 2017-05-20 DIAGNOSIS — M79671 Pain in right foot: Secondary | ICD-10-CM | POA: Diagnosis not present

## 2017-05-20 DIAGNOSIS — M47896 Other spondylosis, lumbar region: Secondary | ICD-10-CM | POA: Diagnosis not present

## 2017-05-20 DIAGNOSIS — M0609 Rheumatoid arthritis without rheumatoid factor, multiple sites: Secondary | ICD-10-CM | POA: Diagnosis not present

## 2017-05-20 DIAGNOSIS — M5440 Lumbago with sciatica, unspecified side: Secondary | ICD-10-CM | POA: Diagnosis not present

## 2017-05-22 DIAGNOSIS — N6310 Unspecified lump in the right breast, unspecified quadrant: Secondary | ICD-10-CM | POA: Diagnosis not present

## 2017-05-22 DIAGNOSIS — Z1272 Encounter for screening for malignant neoplasm of vagina: Secondary | ICD-10-CM | POA: Diagnosis not present

## 2017-05-22 DIAGNOSIS — Z01419 Encounter for gynecological examination (general) (routine) without abnormal findings: Secondary | ICD-10-CM | POA: Diagnosis not present

## 2017-05-22 DIAGNOSIS — Z124 Encounter for screening for malignant neoplasm of cervix: Secondary | ICD-10-CM | POA: Diagnosis not present

## 2017-05-23 DIAGNOSIS — N6312 Unspecified lump in the right breast, upper inner quadrant: Secondary | ICD-10-CM | POA: Diagnosis not present

## 2017-05-24 DIAGNOSIS — M5116 Intervertebral disc disorders with radiculopathy, lumbar region: Secondary | ICD-10-CM | POA: Diagnosis not present

## 2017-05-24 DIAGNOSIS — M4726 Other spondylosis with radiculopathy, lumbar region: Secondary | ICD-10-CM | POA: Diagnosis not present

## 2017-05-27 ENCOUNTER — Other Ambulatory Visit: Payer: Self-pay | Admitting: Radiology

## 2017-05-27 DIAGNOSIS — D0511 Intraductal carcinoma in situ of right breast: Secondary | ICD-10-CM | POA: Diagnosis not present

## 2017-05-27 DIAGNOSIS — D0591 Unspecified type of carcinoma in situ of right breast: Secondary | ICD-10-CM | POA: Diagnosis not present

## 2017-05-27 DIAGNOSIS — C50111 Malignant neoplasm of central portion of right female breast: Secondary | ICD-10-CM | POA: Diagnosis not present

## 2017-05-27 DIAGNOSIS — C50211 Malignant neoplasm of upper-inner quadrant of right female breast: Secondary | ICD-10-CM | POA: Diagnosis not present

## 2017-05-30 ENCOUNTER — Telehealth: Payer: Self-pay | Admitting: *Deleted

## 2017-05-30 NOTE — Telephone Encounter (Signed)
Left message for a return phone call to schedule for [facility] 7/11.

## 2017-05-31 ENCOUNTER — Encounter: Payer: Self-pay | Admitting: Genetics

## 2017-05-31 ENCOUNTER — Telehealth: Payer: Self-pay | Admitting: *Deleted

## 2017-05-31 DIAGNOSIS — Z171 Estrogen receptor negative status [ER-]: Principal | ICD-10-CM

## 2017-05-31 DIAGNOSIS — C50211 Malignant neoplasm of upper-inner quadrant of right female breast: Secondary | ICD-10-CM | POA: Insufficient documentation

## 2017-05-31 DIAGNOSIS — Z170 Estrogen receptor positive status [ER+]: Secondary | ICD-10-CM

## 2017-05-31 NOTE — Telephone Encounter (Signed)
Received return phone call from patient.  Confirmed [facility] for 06/05/17 at 12:15pm .  Instructions and contact information given.

## 2017-06-05 ENCOUNTER — Other Ambulatory Visit (HOSPITAL_BASED_OUTPATIENT_CLINIC_OR_DEPARTMENT_OTHER): Payer: Medicare Other

## 2017-06-05 ENCOUNTER — Encounter: Payer: Self-pay | Admitting: Oncology

## 2017-06-05 ENCOUNTER — Ambulatory Visit
Admission: RE | Admit: 2017-06-05 | Discharge: 2017-06-05 | Disposition: A | Discharge status: Home / Self Care | Payer: Medicare Other | Source: Ambulatory Visit | Attending: Radiation Oncology | Admitting: Radiation Oncology

## 2017-06-05 ENCOUNTER — Ambulatory Visit: Payer: Medicare Other | Attending: General Surgery | Admitting: Physical Therapy

## 2017-06-05 ENCOUNTER — Other Ambulatory Visit: Payer: Self-pay | Admitting: General Surgery

## 2017-06-05 ENCOUNTER — Ambulatory Visit: Payer: Self-pay | Admitting: General Surgery

## 2017-06-05 ENCOUNTER — Ambulatory Visit (HOSPITAL_BASED_OUTPATIENT_CLINIC_OR_DEPARTMENT_OTHER): Payer: Medicare Other | Admitting: Oncology

## 2017-06-05 ENCOUNTER — Other Ambulatory Visit: Payer: Self-pay | Admitting: *Deleted

## 2017-06-05 ENCOUNTER — Encounter: Payer: Self-pay | Admitting: Physical Therapy

## 2017-06-05 VITALS — BP 154/80 | HR 73 | Temp 97.4°F | Resp 20 | Ht 63.0 in | Wt 265.3 lb

## 2017-06-05 DIAGNOSIS — Z807 Family history of other malignant neoplasms of lymphoid, hematopoietic and related tissues: Secondary | ICD-10-CM | POA: Diagnosis not present

## 2017-06-05 DIAGNOSIS — M25511 Pain in right shoulder: Secondary | ICD-10-CM | POA: Diagnosis not present

## 2017-06-05 DIAGNOSIS — G4700 Insomnia, unspecified: Secondary | ICD-10-CM

## 2017-06-05 DIAGNOSIS — C50211 Malignant neoplasm of upper-inner quadrant of right female breast: Secondary | ICD-10-CM

## 2017-06-05 DIAGNOSIS — G8929 Other chronic pain: Secondary | ICD-10-CM | POA: Diagnosis not present

## 2017-06-05 DIAGNOSIS — M2550 Pain in unspecified joint: Secondary | ICD-10-CM | POA: Diagnosis not present

## 2017-06-05 DIAGNOSIS — Z171 Estrogen receptor negative status [ER-]: Secondary | ICD-10-CM | POA: Diagnosis not present

## 2017-06-05 DIAGNOSIS — D0511 Intraductal carcinoma in situ of right breast: Secondary | ICD-10-CM | POA: Diagnosis not present

## 2017-06-05 DIAGNOSIS — M545 Low back pain: Secondary | ICD-10-CM | POA: Diagnosis not present

## 2017-06-05 DIAGNOSIS — R293 Abnormal posture: Secondary | ICD-10-CM

## 2017-06-05 DIAGNOSIS — Z170 Estrogen receptor positive status [ER+]: Secondary | ICD-10-CM | POA: Insufficient documentation

## 2017-06-05 DIAGNOSIS — Z803 Family history of malignant neoplasm of breast: Secondary | ICD-10-CM | POA: Diagnosis not present

## 2017-06-05 DIAGNOSIS — Z8042 Family history of malignant neoplasm of prostate: Secondary | ICD-10-CM | POA: Diagnosis not present

## 2017-06-05 DIAGNOSIS — C50911 Malignant neoplasm of unspecified site of right female breast: Secondary | ICD-10-CM

## 2017-06-05 DIAGNOSIS — Z801 Family history of malignant neoplasm of trachea, bronchus and lung: Secondary | ICD-10-CM | POA: Diagnosis not present

## 2017-06-05 DIAGNOSIS — M25512 Pain in left shoulder: Secondary | ICD-10-CM | POA: Insufficient documentation

## 2017-06-05 LAB — COMPREHENSIVE METABOLIC PANEL
ALT: 14 U/L (ref 0–55)
AST: 18 U/L (ref 5–34)
Albumin: 3.4 g/dL — ABNORMAL LOW (ref 3.5–5.0)
Alkaline Phosphatase: 102 U/L (ref 40–150)
Anion Gap: 9 mEq/L (ref 3–11)
BILIRUBIN TOTAL: 0.43 mg/dL (ref 0.20–1.20)
BUN: 16.2 mg/dL (ref 7.0–26.0)
CO2: 25 mEq/L (ref 22–29)
CREATININE: 1 mg/dL (ref 0.6–1.1)
Calcium: 9.4 mg/dL (ref 8.4–10.4)
Chloride: 101 mEq/L (ref 98–109)
EGFR: 55 mL/min/{1.73_m2} — ABNORMAL LOW (ref >90)
GLUCOSE: 152 mg/dL — AB (ref 70–140)
Potassium: 4.2 mEq/L (ref 3.5–5.1)
SODIUM: 136 meq/L (ref 136–145)
Total Protein: 6.7 g/dL (ref 6.4–8.3)

## 2017-06-05 LAB — CBC WITH DIFFERENTIAL/PLATELET
BASO%: 0.6 % (ref 0.0–2.0)
Basophils Absolute: 0 10*3/uL (ref 0.0–0.1)
EOS%: 0.8 % (ref 0.0–7.0)
Eosinophils Absolute: 0.1 10*3/uL (ref 0.0–0.5)
HCT: 33.9 % — ABNORMAL LOW (ref 34.8–46.6)
HGB: 11 g/dL — ABNORMAL LOW (ref 11.6–15.9)
LYMPH%: 31.3 % (ref 14.0–49.7)
MCH: 27 pg (ref 25.1–34.0)
MCHC: 32.5 g/dL (ref 31.5–36.0)
MCV: 83.2 fL (ref 79.5–101.0)
MONO#: 0.6 10*3/uL (ref 0.1–0.9)
MONO%: 7.9 % (ref 0.0–14.0)
NEUT%: 59.4 % (ref 38.4–76.8)
NEUTROS ABS: 4.6 10*3/uL (ref 1.5–6.5)
Platelets: 306 10*3/uL (ref 145–400)
RBC: 4.07 10*6/uL (ref 3.70–5.45)
RDW: 15.4 % — AB (ref 11.2–14.5)
WBC: 7.7 10*3/uL (ref 3.9–10.3)
lymph#: 2.4 10*3/uL (ref 0.9–3.3)

## 2017-06-05 NOTE — Progress Notes (Signed)
[facility]  [phone] [phone]     ID: Raniya M Scurlock DOB: [birth year]  MR#: [id number]  CSN#:[id number]  Patient Care Team: Woodyear, Wynne E, MD as PCP - General (Internal Medicine) Hoxworth, Benjamin, MD as Consulting Physician (General Surgery) ,  C, MD as Consulting Physician (Oncology) Kinard, James, MD as Consulting Physician (Radiation Oncology) , C, MD OTHER MD:  CHIEF COMPLAINT: Estrogen receptor positive breast cancer  CURRENT TREATMENT: Awaiting definitive surgery   BREAST CANCER HISTORY: "[name]" herself noted a change around her nipple on the right and brought it to medical attention. She was set up for bilateral diagnostic mammography with tomography and right breast ultrasonography at [facility] 05/23/2017. The breast density was category C. In the right breast upper inner quadrant there was a 2.5 cm area of grouped pleomorphic calcifications correlating with the palpable mass. Also there was a 1.2 cm macrolobulated mass in the upper inner quadrant also in the palpable region. These masses were identifiable by ultrasound with the same dimensions. The right axilla was sonographically negative.  On 05/27/2017 the patient underwent biopsy of the 2 masses in question. 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  The patient's subsequent history is as detailed below.  INTERVAL HISTORY: [name] was evaluated in the multidisciplinary breast cancer clinic 06/04/2017 accompanied by her husband Gerald. Her case was also  presented in the multidisciplinary breast cancer conference that same morning. At that time a preliminary plan was proposed: Likely mastectomy, likely chemotherapy with trastuzumab, likely no radiation, and anti-estrogens  REVIEW OF SYSTEMS: Aside from the mass itself there were no specific symptoms leading to the original mammogram, which was routinely scheduled. The patient denies unusual headaches, visual changes, nausea, vomiting, stiff neck, dizziness, or gait imbalance. There has been no cough, phlegm production, or pleurisy, no chest pain or pressure, and no change in bowel or bladder habits. The patient denies fever, rash, bleeding, unexplained fatigue or unexplained weight loss. The patient does complain of some insomnia, some low back and joint pain which is not more persistent or intense than usual, occasional ankle swelling, shortness of breath when climbing stairs, some difficulty walking. A detailed review of systems was otherwise entirely negative.    PAST MEDICAL HISTORY: Past Medical History:  Diagnosis Date  . Arthritis    osteo, rheumatoid  . Diverticulosis   . Dysrhythmia    hx rapid heartbeat, no cardiologist  . Fibromyalgia    peripheral neuropathy  . Fibromyalgia   . GERD (gastroesophageal reflux disease)   . H/O hiatal hernia   . Hyperlipidemia   . PONV (postoperative nausea and vomiting)    "THROAT WITH EXTREME BRUSING"1986, ulnar nerve surgery  . Sciatica   . Shortness of breath dyspnea    with exertion  . UTI (lower urinary tract infection)     PAST SURGICAL HISTORY: Past Surgical History:  Procedure Laterality Date  . ABDOMINAL HYSTERECTOMY    . APPENDECTOMY    . BREAST SURGERY     biopsy  . CARDIAC CATHETERIZATION  2009   "no blockage per pt"  . CHOLECYSTECTOMY    . CYSTOSCOPY   WITH BIOPSY    . INCONTINENCE SURGERY    . KNEE ARTHROSCOPY     bilateral  . KNEE ARTHROSCOPY Left 03/17/2014   Procedure: ARTHROSCOPY LEFT KNEE WITH SYNOVECTOMY;  Surgeon:  Frank V Aluisio, MD;  Location: [facility];  Service: Orthopedics;  Laterality: Left;  . ROTATOR CUFF REPAIR     right  . TOTAL KNEE ARTHROPLASTY  08/04/2012   Procedure: TOTAL KNEE ARTHROPLASTY;  Surgeon: Frank V Aluisio, MD;  Location: [facility];  Service: Orthopedics;  Laterality: Left;  . TOTAL KNEE ARTHROPLASTY Right 04/11/2015   Procedure: RIGHT TOTAL KNEE ARTHROPLASTY;  Surgeon: Frank Aluisio, MD;  Location: [facility];  Service: Orthopedics;  Laterality: Right;  . ulnar nerve surgery Right 1986    FAMILY HISTORY Family History  Problem Relation Age of Onset  . Hypertension Mother   . Stroke Mother   . Breast cancer Mother   . Hypertension Father        MI  . Hypertension Brother        MI  . Prostate cancer Brother   . Hypertension Brother        MI  . Lung cancer Brother   . Hypertension Brother        MI  . Breast cancer Sister   . Non-Hodgkin's lymphoma Sister    the patient's father died from a heart attack at age 68. The patient's mother was diagnosed with breast cancer at age 70 and died at age 77. The patient had 5 brothers, 3 sisters. One sister had breast cancer at age 75. One brother was diagnosed with prostate cancer at age 50. On Brother died from lung cancer at age 79. One sister had non-Hodgkin's lymphoma diagnosed at age 75.   GYNECOLOGIC HISTORY:  No LMP recorded. Patient has had a hysterectomy.  menarche age 11, first live birth age 25. All 3 of her births were premature including 1 set of twins. One of the twins died a few hours after birth. She underwent hysterectomy in 1981 with unilateral salpingo-oophorectomy. 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 Her husband Gerald began a business installing fences which is now carried on by his son Tyler. Gerald is also the church organist. Son Travis is a truck driver in [place]. The patient has one grandchild. She is a  [employer].     ADVANCED DIRECTIVES: In place. The patient's husband is her healthcare power of attorney   HEALTH MAINTENANCE: Social History  Substance Use Topics  . Smoking status: Never Smoker  . Smokeless tobacco: Never Used  . Alcohol use No     Colonoscopy:2017 / [place]   PAP:  Bone density:June 2014, normal    Allergies  Allergen Reactions  . Duloxetine Other (See Comments)    unknown  . Oseltamivir Diarrhea    Abdominal pain Abdominal pain  . Nsaids Rash    Current Outpatient Prescriptions  Medication Sig Dispense Refill  . Abatacept (ORENCIA IV) Inject 1,000 mg into the vein every 30 (thirty) days.    . acetaminophen (TYLENOL) 500 MG tablet Take 500 mg by mouth every 6 (six) hours as needed (Pain). Pain    . amitriptyline (ELAVIL) 10 MG tablet Take 10 mg by mouth at bedtime as needed for sleep.    . aspirin 81 MG chewable tablet Chew by mouth daily.    . atenolol (TENORMIN) 100 MG tablet   Take 50 mg by mouth daily after breakfast.     . bisacodyl (DULCOLAX) 10 MG suppository Place 1 suppository (10 mg total) rectally daily as needed for moderate constipation. 12 suppository 0  . buPROPion (WELLBUTRIN XL) 150 MG 24 hr tablet Take 150 mg by mouth daily before breakfast.    . cyclobenzaprine (FLEXERIL) 5 MG tablet Take 5 mg by mouth at bedtime as needed for muscle spasms.    Marland Kitchen docusate sodium (COLACE) 100 MG capsule Take 1 capsule (100 mg total) by mouth 2 (two) times daily. (Patient taking differently: Take 100 mg by mouth 2 (two) times daily as needed. ) 10 capsule 0  . folic acid (FOLVITE) 1 MG tablet Take 1 mg by mouth daily.    Marland Kitchen omeprazole (PRILOSEC) 40 MG capsule Take 40 mg by mouth every morning.    . polyethylene glycol (MIRALAX / GLYCOLAX) packet Take 17 g by mouth daily as needed for mild constipation. 14 each 0  . DULoxetine (CYMBALTA) 30 MG capsule Take 30 mg by mouth daily.    . methocarbamol (ROBAXIN) 500 MG tablet Take 1 tablet (500 mg total) by mouth  every 6 (six) hours as needed for muscle spasms. (Patient not taking: Reported on 06/05/2017) 80 tablet 0  . metoCLOPramide (REGLAN) 5 MG tablet Take 1-2 tablets (5-10 mg total) by mouth every 8 (eight) hours as needed for nausea (if ondansetron (ZOFRAN) ineffective.). (Patient not taking: Reported on 06/05/2017) 40 tablet 0  . ondansetron (ZOFRAN) 4 MG tablet Take 1 tablet (4 mg total) by mouth every 6 (six) hours as needed for nausea. (Patient not taking: Reported on 06/05/2017) 40 tablet 0  . oxyCODONE (OXY IR/ROXICODONE) 5 MG immediate release tablet Take 1-2 tablets (5-10 mg total) by mouth every 3 (three) hours as needed for moderate pain, severe pain or breakthrough pain. (Patient not taking: Reported on 06/05/2017) 90 tablet 0  . rivaroxaban (XARELTO) 10 MG TABS tablet Take 1 tablet (10 mg total) by mouth daily with breakfast. Take Xarelto for two and a half more weeks, then discontinue Xarelto. Once the patient has completed the Xarelto, they may resume the 81 mg Aspirin. (Patient not taking: Reported on 06/05/2017) 19 tablet 0  . traMADol (ULTRAM) 50 MG tablet Take 1-2 tablets (50-100 mg total) by mouth every 6 (six) hours as needed (mild pain). (Patient not taking: Reported on 06/05/2017) 60 tablet 1   No current facility-administered medications for this visit.     OBJECTIVE: Older white woman in no acute distress  Vitals:   06/05/17 1311  BP: (!) 154/80  Pulse: 73  Resp: 20  Temp: (!) 97.4 F (36.3 C)     Body mass index is 47 kg/m.  @.WEIGHTLAST3@    ECOG FS:1 - Symptomatic but completely ambulatory  Ocular: Sclerae unicteric, pupils round and equal Ear-nose-throat: Oropharynx clear and moist Lymphatic: No cervical or supraclavicular adenopathy Lungs no rales or rhonchi Heart regular rate and rhythm Abd soft, nontender, positive bowel sounds MSK no focal spinal tenderness, no joint edema Neuro: non-focal, well-oriented, appropriate affect Breasts:  the area in the right  breast that is of concern is lateral to the nipple, which is more firmness and a well-defined mass. There is no overlying erythema. The left breast is unremarkable. Both axillae are benign.    LAB RESULTS:  CMP     Component Value Date/Time   NA 136 06/05/2017 1257   K 4.2 06/05/2017 1257   CL 104 04/13/2015 0428   CO2  25 06/05/2017 1257   GLUCOSE 152 (H) 06/05/2017 1257   BUN 16.2 06/05/2017 1257   CREATININE 1.0 06/05/2017 1257   CALCIUM 9.4 06/05/2017 1257   PROT 6.7 06/05/2017 1257   ALBUMIN 3.4 (L) 06/05/2017 1257   AST 18 06/05/2017 1257   ALT 14 06/05/2017 1257   ALKPHOS 102 06/05/2017 1257   BILITOT 0.43 06/05/2017 1257   GFRNONAA 58 (L) 04/13/2015 0428   GFRAA >60 04/13/2015 0428    No results found for: TOTALPROTELP, ALBUMINELP, A1GS, A2GS, BETS, BETA2SER, GAMS, MSPIKE, SPEI  No results found for: KPAFRELGTCHN, LAMBDASER, KAPLAMBRATIO  Lab Results  Component Value Date   WBC 7.7 06/05/2017   NEUTROABS 4.6 06/05/2017   HGB 11.0 (L) 06/05/2017   HCT 33.9 (L) 06/05/2017   MCV 83.2 06/05/2017   PLT 306 06/05/2017      Chemistry      Component Value Date/Time   NA 136 06/05/2017 1257   K 4.2 06/05/2017 1257   CL 104 04/13/2015 0428   CO2 25 06/05/2017 1257   BUN 16.2 06/05/2017 1257   CREATININE 1.0 06/05/2017 1257      Component Value Date/Time   CALCIUM 9.4 06/05/2017 1257   ALKPHOS 102 06/05/2017 1257   AST 18 06/05/2017 1257   ALT 14 06/05/2017 1257   BILITOT 0.43 06/05/2017 1257       No results found for: LABCA2  No components found for: LABCAN125  No results for input(s): INR in the last 168 hours.  Urinalysis    Component Value Date/Time   COLORURINE YELLOW 04/06/2015 1332   APPEARANCEUR CLOUDY (A) 04/06/2015 1332   LABSPEC 1.008 04/06/2015 1332   PHURINE 6.0 04/06/2015 1332   GLUCOSEU NEGATIVE 04/06/2015 1332   HGBUR SMALL (A) 04/06/2015 1332   BILIRUBINUR NEGATIVE 04/06/2015 1332   KETONESUR NEGATIVE 04/06/2015 1332    PROTEINUR NEGATIVE 04/06/2015 1332   UROBILINOGEN 0.2 04/06/2015 1332   NITRITE NEGATIVE 04/06/2015 1332   LEUKOCYTESUR MODERATE (A) 04/06/2015 1332     STUDIES: Outside studies reviewed with the patient   ELIGIBLE FOR AVAILABLE RESEARCH PROTOCOL:   ASSESSMENT: 75 y.o. [place] woman status post right breast upper inner quadrant biopsy 2 on 05/27/2017 showing a clinical T1c N0, stage IA invasive ductal carcinoma, grade 3, estrogen and progesterone receptor positive, HER-2 amplified, with an MIB-1 of 60%  (1) genetics testing pending  (2) anastrozole started 06/05/2017, to be continued and minimum of 5 years   (3) definitive surgery pending: Likely mastectomy   (4) trastuzumab for 6 months to be given adjuvantly  (5) adjuvant radiation as appropriate  PLAN: We spent the better part of today's hour-long appointment discussing the biology of breast cancer in general, and the specifics of the patient's tumor in particular. We first reviewed the fact that cancer is not one disease but more than 100 different diseases and that it is important to keep them separate-- otherwise when friends and relatives discuss their own cancer experiences with Sophia confusion can result. Similarly we explained that if breast cancer spreads to the bone or liver, the patient would not have bone cancer or liver cancer, but breast cancer in the bone and breast cancer in the liver: one cancer in three places-- not 3 different cancers which otherwise would have to be treated in 3 different ways.  We discussed the difference between local and systemic therapy. In terms of loco-regional treatment, lumpectomy plus radiation is equivalent to mastectomy as far as survival is concerned. However in polys case   the amount of tumor and specifically ductal carcinoma in situ is such that mastectomy may be unavoidable. We also noted that in terms of sequencing of treatments, whether systemic therapy or surgery is done first  does not affect the ultimate outcome.   We then discussed the rationale for systemic therapy. There is some risk that this cancer may have already spread to other parts of her body. Patients frequently ask at this point about bone scans, CAT scans and PET scans to find out if they have occult breast cancer somewhere else. The problem is that in early stage disease we are much more likely to find false positives then true cancers and this would expose the patient to unnecessary procedures as well as unnecessary radiation. Scans cannot answer the question the patient really would like to know, which is whether she has microscopic disease elsewhere in her body. For those reasons we do not recommend them.  Of course we would proceed to aggressive evaluation of any symptoms that might suggest metastatic disease, but that is not the case here.  Next we went over the options for systemic therapy which are anti-estrogens, anti-HER-2 immunotherapy, and chemotherapy. Cella meets criteria for all 3. Specifically since the invasive tumor is estrogen receptor positive and HER-2 positive she will need anti-HER-2 treatment and anti-estrogen treatment.  The contribution of chemotherapy in this setting is likely small. We do not have category 1 data for giving anti-HER-2 treatment adjuvantly in the absence of some form of chemotherapy, which in this case would be paclitaxel. We do have evidence from stage IV disease that the combination of anti-estrogens and anti-HER-2 treatment is at least additive if not synergistic. We also know that in HER-2 positive cases there is "cross talk" with the estrogen receptor unless the HER-2 receptor is silenced and this means we cannot simply treat Corayma with anti-estrogens alone as we woul like to do, because the chance of response to antiestrogens alone is low.  Accordingly my plan in this case is to treat with trastuzumab as well as anastrozole. We have gone ahead and started the  anastrozole to make sure that she will be able to tolerated and to start treatment while full workup is pending. We discussed the possible toxicities, side effects and complications of this agent and she had a normal bone density in 2014. This will be repeated at some point this year  In short the overall plan is to start with definitive surgery, a port will be placed at the same time, she will also have an echocardiogram, and if she needs radiation which seems unlikely that would follow. If not what would follow is trastuzumab for a minimum of 6 months, possibly 12, depending on tolerance. She will continue anastrozole for a total of 5 years.  Ahnika has a good understanding of the overall plan. She agrees with it. She knows the goal of treatment in her case is cure. She will call with any problems that may develop before her next visit here.  Chauncey Cruel, MD   06/05/2017 3:05 PM Medical Oncology and Hematology [facility] [address] [place], [place] [address] Tel. [phone]    Fax. [phone]

## 2017-06-05 NOTE — Therapy (Signed)
[place], [place], [address] Phone: [phone]   Fax:  [phone]  Physical Therapy Evaluation  Patient Details  Name: Kiara Brown MRN: [id number] Date of Birth: [birth year] Referring Provider: Dr. Excell Seltzer  Encounter Date: 06/05/2017      PT End of Session - 06/05/17 1520    Visit Number 1   Number of Visits 1   PT Start Time 1436   PT Stop Time 1500   PT Time Calculation (min) 24 min   Activity Tolerance Patient tolerated treatment well   Behavior During Therapy [facility] for tasks assessed/performed      Past Medical History:  Diagnosis Date  . Arthritis    osteo, rheumatoid  . Diverticulosis   . Dysrhythmia    hx rapid heartbeat, no cardiologist  . Fibromyalgia    peripheral neuropathy  . Fibromyalgia   . GERD (gastroesophageal reflux disease)   . H/O hiatal hernia   . Hyperlipidemia   . PONV (postoperative nausea and vomiting)    "THROAT WITH EXTREME [id number]"8341, ulnar nerve surgery  . Sciatica   . Shortness of breath dyspnea    with exertion  . UTI (lower urinary tract infection)     Past Surgical History:  Procedure Laterality Date  . ABDOMINAL HYSTERECTOMY    . APPENDECTOMY    . BREAST SURGERY     biopsy  . CARDIAC CATHETERIZATION  2009   "no blockage per pt"  . CHOLECYSTECTOMY    . CYSTOSCOPY WITH BIOPSY    . INCONTINENCE SURGERY    . KNEE ARTHROSCOPY     bilateral  . KNEE ARTHROSCOPY Left 03/17/2014   Procedure: ARTHROSCOPY LEFT KNEE WITH SYNOVECTOMY;  Surgeon: Gearlean Alf, MD;  Location: [facility];  Service: Orthopedics;  Laterality: Left;  . ROTATOR CUFF REPAIR     right  . TOTAL KNEE ARTHROPLASTY  08/04/2012   Procedure: TOTAL KNEE ARTHROPLASTY;  Surgeon: Gearlean Alf, MD;  Location: [facility];  Service: Orthopedics;  Laterality: Left;  . TOTAL KNEE ARTHROPLASTY Right 04/11/2015   Procedure: RIGHT TOTAL KNEE ARTHROPLASTY;  Surgeon: Gaynelle Arabian, MD;   Location: [facility];  Service: Orthopedics;  Laterality: Right;  . ulnar nerve surgery Right 1986    There were no vitals filed for this visit.       Subjective Assessment - 06/05/17 1504    Subjective Patient reports she is here today to be seen by her medical team for her newly diagnosed right breast cancer.   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 There are 2 areas: 2.5 cm located at 12:00 which is DCIS and ER/PR negative. The other area measures 1.2 cm and is located in the upper inner quadrant which is Triple positive with a Ki67 of 60%.   Patient Stated Goals reduce lymphedema risk and learn post op shoulder ROM HEP   Currently in Pain? Yes  Many areas systemically due to fibromyalgia and rheumatoid arthritis.; both shoulders involved   Pain Score 7   Worst area of pain is low back   Pain Location Back   Pain Orientation Lower   Pain Descriptors / Indicators Aching  Stiffness   Pain Type Chronic pain   Pain Onset More than a month ago   Pain Frequency Intermittent   Aggravating Factors  Prolonged sitting or walking   Pain Relieving Factors Unknown  [facility] PT Assessment - 06/05/17 0001      Assessment   Medical Diagnosis Right breast cancer   Referring Provider Dr. Excell Seltzer   Onset Date/Surgical Date 05/23/17   Hand Dominance Right   Prior Therapy none     Precautions   Precautions Other (comment)   Precaution Comments active cancer     Restrictions   Weight Bearing Restrictions No     Balance Screen   Has the patient fallen in the past 6 months No   Has the patient had a decrease in activity level because of a fear of falling?  No   Is the patient reluctant to leave their home because of a fear of falling?  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   Leisure She does not exercise     Cognition   Overall Cognitive Status Within Functional Limits for tasks assessed     Posture/Postural Control   Posture/Postural Control Postural limitations   Postural Limitations Rounded Shoulders;Forward head     ROM / Strength   AROM / PROM / Strength AROM;Strength     AROM   AROM Assessment Site Shoulder;Cervical   Right/Left Shoulder Right;Left   Right Shoulder Extension 47 Degrees   Right Shoulder Flexion 150 Degrees   Right Shoulder ABduction 132 Degrees   Right Shoulder Internal Rotation 63 Degrees   Right Shoulder External Rotation 75 Degrees   Left Shoulder Extension 48 Degrees   Left Shoulder Flexion 150 Degrees   Left Shoulder ABduction 139 Degrees   Left Shoulder Internal Rotation 78 Degrees   Left Shoulder External Rotation 82 Degrees   Cervical Flexion WNL   Cervical Extension 75% limited   Cervical - Right Side Bend 75% limited   Cervical - Left Side Bend 75% limited   Cervical - Right Rotation 50% limited   Cervical - Left Rotation 75% limited     Strength   Overall Strength Within functional limits for tasks performed           LYMPHEDEMA/ONCOLOGY QUESTIONNAIRE - 06/05/17 1518      Type   Cancer Type Right breast cancer     Lymphedema Assessments   Lymphedema Assessments Upper extremities     Right Upper Extremity Lymphedema   10 cm Proximal to Olecranon Process 47.6 cm   Olecranon Process 32 cm   10 cm Proximal to Ulnar Styloid Process 27.6 cm   Just Proximal to Ulnar Styloid Process 18.4 cm   Across Hand at [employer] 20.4 cm   At [place] of 2nd Digit 6.7 cm     Left Upper Extremity Lymphedema   10 cm Proximal to Olecranon Process 52.6 cm   Olecranon Process 32.2 cm   10  cm Proximal to Ulnar Styloid Process 28.7 cm   Just Proximal to Ulnar Styloid Process 19.9 cm   Across Hand at [employer] 19.3 cm   At [place] of 2nd Digit 6.4 cm         Objective measurements completed on examination: See above findings.       Patient was instructed today in a home exercise program today for post op shoulder range of motion. These  included active assist shoulder flexion in sitting, scapular retraction, wall walking with shoulder abduction, and hands behind head external rotation.  She was encouraged to do these twice a day, holding 3 seconds and repeating 5 times when permitted by her physician.               PT Education - 06/05/17 1520    Education provided Yes   Education Details Lymphedema risk reduction and post op shoulder ROM HEP   Person(s) Educated Patient;Spouse   Methods Explanation;Demonstration;Handout   Comprehension Returned demonstration;Verbalized understanding              Breast Clinic Goals - 06/05/17 1540      Patient will be able to verbalize understanding of pertinent lymphedema risk reduction practices relevant to her diagnosis specifically related to skin care.   Time 1   Period Days   Status Achieved     Patient will be able to return demonstrate and/or verbalize understanding of the post-op home exercise program related to regaining shoulder range of motion.   Time 1   Period Days   Status Achieved     Patient will be able to verbalize understanding of the importance of attending the postoperative After Breast Cancer Class for further lymphedema risk reduction education and therapeutic exercise.   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 There are 2 areas: 2.5 cm located at 12:00 which is DCIS and ER/PR negative. The other area  measures 1.2 cm and is located in the upper inner quadrant which is Triple positive with a Ki67 of 60%. Her multidisciplinary medical team met prior to her assessments to determine a recommended treatment plan. She is planning to have a right mastectomy and sentinel node biopsy followed by Herceptin and anti-estrogen therapy. She may benefit from post op PT to regain shoulder ROM and reduce her risk of lymphedema.   History and Personal Factors relevant to plan of care: Fibromyalgia and rheumatoid arthritis   Clinical Presentation Stable   Clinical Presentation due to: Stable condition   Clinical Decision Making Low   Rehab Potential Excellent   Clinical Impairments Affecting Rehab Potential Above stated comorbidities   PT Frequency One time visit   PT Treatment/Interventions Therapeutic exercise;Patient/family education   PT Next Visit Plan Will f/u after surgery to determine PT needs   PT Home Exercise Plan Post op shoulder ROM HEP   Consulted and Agree with Plan of Care Patient;Family member/caregiver   Family Member Consulted Husband      Patient will benefit from skilled therapeutic intervention in order to improve the following deficits and impairments:  Postural dysfunction, Decreased knowledge of precautions, Pain, Impaired UE functional use, Decreased range of motion  Visit Diagnosis: Abnormal posture - Plan: PT plan of care cert/re-cert  Malignant neoplasm of upper-inner quadrant of right breast in female, estrogen receptor positive ([place]) - Plan: PT plan of care cert/re-cert  Chronic left shoulder pain - Plan: PT plan of care cert/re-cert  Chronic right shoulder pain - Plan: PT plan of care cert/re-cert  Patient will follow up at outpatient cancer rehab if needed following surgery.  If the patient requires physical therapy at that time, a specific plan will be dictated and sent to the referring physician for  approval. The patient was educated today on appropriate basic range of  motion exercises to begin post operatively and the importance of attending the After Breast Cancer class following surgery.  Patient was educated today on lymphedema risk reduction practices as it pertains to recommendations that will benefit the patient immediately following surgery.  She verbalized good understanding.  No additional physical therapy is indicated at this time.        G-Codes - Jun 11, 2017 1540    Functional Assessment Tool Used (Outpatient Only) Clinical Judgement   Functional Limitation Other PT primary   Other PT Primary Current Status (V7473) At least 1 percent but less than 20 percent impaired, limited or restricted   Other PT Primary Goal Status (U0370) At least 1 percent but less than 20 percent impaired, limited or restricted   Other PT Primary Discharge Status (D6438) At least 1 percent but less than 20 percent impaired, limited or restricted       Problem List Patient Active Problem List   Diagnosis Date Noted  . Malignant neoplasm of upper-inner quadrant of right breast in female, estrogen receptor negative ([place]) 05/31/2017  . Synovitis of knee 03/16/2014  . Postop Hypokalemia 08/07/2012  . Postop Hyponatremia 08/06/2012  . OA (osteoarthritis) of knee 08/04/2012    Annia Friendly, PT 2017/06/11 3:43 PM  [place], [place], [address] Phone: [phone]   Fax:  [phone]  Name: DORSEY CHARETTE MRN: [id number] Date of Birth: 1941/10/25

## 2017-06-05 NOTE — Patient Instructions (Signed)

## 2017-06-05 NOTE — Progress Notes (Signed)
Radiation Oncology         [phone] ________________________________  Initial Outpatient Consultation  Name: Kiara Brown MRN: [id number]  Date: 06/05/2017  DOB: 08-Aug-1941  [id number], Kiara Footman, MD  Excell Seltzer, MD   REFERRING PHYSICIAN: Excell Seltzer, MD  DIAGNOSIS: The encounter diagnosis was Malignant neoplasm of upper-inner quadrant of right breast in female, estrogen receptor negative ([place]).  Stage T1c Nx Mx Right Breast Invasive Ductal Carcinoma , ER/PR +, Her2 Positive, grade III, DCIS in a separate lesion ER-, PR-  HISTORY OF PRESENT ILLNESS::Kiara Brown is a 76 y.o. female who initially presented with a palpable right breast mass. Mammogram showed 2 areas of abnormality. There was a 2.5 cm area of grouped pleomorphic calcifications in the right breast UIQ. The second mass was 1.2 cm, oval, and of equal density mass in the right breast UIQ in the retroareolar region. US showed 2 masses, a 2.5 cm mass in the 12 o'clock position and 1.2 cm in 1 o'clock position.  The patient underwent right breast biopsy on 05/27/17. Biopsy of the 12 o'clock position revealed ductal carcinoma in situ, high grade, with necrosis and calcifications. Biopsy of the 1 o'clock position showed invasive ductal carcinoma.   The patient presents to the multidisciplinary clinic today to discuss the role that radiation therapy may play in the treatment of her disease.  On review of systems, the patient is positive for weight changes, loss of sleep, fatigue, pain, contact lenses, feet swelling, and shortness of breath when walking. She also reports heartburn, hernias, breast lump, back and joint pain, arthritis, and difficulty walking.  Gynecologic History: Age at first menstrual period? 11 Are you still having periods? No  Approximate date of last period? 1981 If you no longer have periods: Have you used hormone replacement? Yes Obstetric History:  How many children have you carried to  term? 0 (Patient notes 3 premature live births, one infant mortality)  Your age at first live birth? 52 Pregnant now or trying to get pregnant? No Have you used birth control pills or hormone shots for contraception? Yes If so, for how long (or approximate dates)? 12 years Health Maintenance: Have you ever had a colonoscopy? Yes If yes, date? 2017 Have you ever had a bone density? Yes Date of your last PAP smear? Not reported  Date of your FIRST mammogram? 1986  PREVIOUS RADIATION THERAPY: No  PAST MEDICAL HISTORY:  has a past medical history of Arthritis; Diverticulosis; Dysrhythmia; Fibromyalgia; Fibromyalgia; GERD (gastroesophageal reflux disease); H/O hiatal hernia; Hyperlipidemia; PONV (postoperative nausea and vomiting); Sciatica; Shortness of breath dyspnea; and UTI (lower urinary tract infection).    PAST SURGICAL HISTORY: Past Surgical History:  Procedure Laterality Date  . ABDOMINAL HYSTERECTOMY    . APPENDECTOMY    . BREAST SURGERY     biopsy  . CARDIAC CATHETERIZATION  2009   "no blockage per pt"  . CHOLECYSTECTOMY    . CYSTOSCOPY WITH BIOPSY    . INCONTINENCE SURGERY    . KNEE ARTHROSCOPY     bilateral  . KNEE ARTHROSCOPY Left 03/17/2014   Procedure: ARTHROSCOPY LEFT KNEE WITH SYNOVECTOMY;  Surgeon: Gearlean Alf, MD;  [facility];  Service: Orthopedics;  Laterality: Left;  . ROTATOR CUFF REPAIR     right  . TOTAL KNEE ARTHROPLASTY  08/04/2012   Procedure: TOTAL KNEE ARTHROPLASTY;  Surgeon: Gearlean Alf, MD;  [facility];  Service: Orthopedics;  Laterality: Left;  . TOTAL KNEE ARTHROPLASTY Right 04/11/2015  Procedure: RIGHT TOTAL KNEE ARTHROPLASTY;  Surgeon: Gaynelle Arabian, MD;  Location[facility];  Service: Orthopedics;  Laterality: Right;  . ulnar nerve surgery Right 1986    FAMILY HISTORY: family history includes Breast cancer in her mother and sister; Hypertension in her brother, brother, brother, father, and mother; Lung cancer in her brother;  Non-Hodgkin's lymphoma in her sister; Prostate cancer in her brother; Stroke in her mother.  SOCIAL HISTORY:  reports that she has never smoked. She has never used smokeless tobacco. She reports that she does not drink alcohol or use drugs.   ALLERGIES: Duloxetine; Oseltamivir; and Nsaids  MEDICATIONS:  Current Outpatient Prescriptions  Medication Sig Dispense Refill  . Abatacept (ORENCIA IV) Inject 1,000 mg into the vein every 30 (thirty) days.    Marland Kitchen acetaminophen (TYLENOL) 500 MG tablet Take 500 mg by mouth every 6 (six) hours as needed (Pain). Pain    . amitriptyline (ELAVIL) 10 MG tablet Take 10 mg by mouth at bedtime as needed for sleep.    Marland Kitchen aspirin 81 MG chewable tablet Chew by mouth daily.    Marland Kitchen atenolol (TENORMIN) 100 MG tablet Take 50 mg by mouth daily after breakfast.     . bisacodyl (DULCOLAX) 10 MG suppository Place 1 suppository (10 mg total) rectally daily as needed for moderate constipation. 12 suppository 0  . buPROPion (WELLBUTRIN XL) 150 MG 24 hr tablet Take 150 mg by mouth daily before breakfast.    . cyclobenzaprine (FLEXERIL) 5 MG tablet Take 5 mg by mouth at bedtime as needed for muscle spasms.    Marland Kitchen docusate sodium (COLACE) 100 MG capsule Take 1 capsule (100 mg total) by mouth 2 (two) times daily. (Patient taking differently: Take 100 mg by mouth 2 (two) times daily as needed. ) 10 capsule 0  . DULoxetine (CYMBALTA) 30 MG capsule Take 30 mg by mouth daily.    . folic acid (FOLVITE) 1 MG tablet Take 1 mg by mouth daily.    . methocarbamol (ROBAXIN) 500 MG tablet Take 1 tablet (500 mg total) by mouth every 6 (six) hours as needed for muscle spasms. (Patient not taking: Reported on 06/05/2017) 80 tablet 0  . metoCLOPramide (REGLAN) 5 MG tablet Take 1-2 tablets (5-10 mg total) by mouth every 8 (eight) hours as needed for nausea (if ondansetron (ZOFRAN) ineffective.). (Patient not taking: Reported on 06/05/2017) 40 tablet 0  . omeprazole (PRILOSEC) 40 MG capsule Take 40 mg by  mouth every morning.    . ondansetron (ZOFRAN) 4 MG tablet Take 1 tablet (4 mg total) by mouth every 6 (six) hours as needed for nausea. (Patient not taking: Reported on 06/05/2017) 40 tablet 0  . oxyCODONE (OXY IR/ROXICODONE) 5 MG immediate release tablet Take 1-2 tablets (5-10 mg total) by mouth every 3 (three) hours as needed for moderate pain, severe pain or breakthrough pain. (Patient not taking: Reported on 06/05/2017) 90 tablet 0  . polyethylene glycol (MIRALAX / GLYCOLAX) packet Take 17 g by mouth daily as needed for mild constipation. 14 each 0  . rivaroxaban (XARELTO) 10 MG TABS tablet Take 1 tablet (10 mg total) by mouth daily with breakfast. Take Xarelto for two and a half more weeks, then discontinue Xarelto. Once the patient has completed the Xarelto, they may resume the 81 mg Aspirin. (Patient not taking: Reported on 06/05/2017) 19 tablet 0  . traMADol (ULTRAM) 50 MG tablet Take 1-2 tablets (50-100 mg total) by mouth every 6 (six) hours as needed (mild pain). (Patient not taking:  Reported on 06/05/2017) 60 tablet 1   No current facility-administered medications for this encounter.     REVIEW OF SYSTEMS:  A 10+ POINT REVIEW OF SYSTEMS WAS OBTAINED including neurology, dermatology, psychiatry, cardiac, respiratory, lymph, extremities, GI, GU, musculoskeletal, constitutional, reproductive, HEENT. All pertinent positives are noted in the HPI. All others are negative.   PHYSICAL EXAM:  Vitals with BMI 06/05/2017  Height _0   Weight 265 lbs 5 oz  BMI 61.4  Systolic 431  Diastolic 80  Pulse 73  Respirations 20  General: Alert and oriented, in no acute distress. HEENT: Head is normocephalic. Extraocular movements are intact. Oropharynx is clear. Neck: Neck is supple, no palpable cervical or supraclavicular lymphadenopathy. Heart: Regular in rate and rhythm with no murmurs, rubs, or gallops. Chest: Clear to auscultation bilaterally, with no rhonchi, wheezes, or rales. Psychiatric:  Judgment and insight are intact. Affect is appropriate. Breast: Left breast has no palpable masses, nipple discharge or bleeding. She has a faint scar in the upper-outer quadrant from prior benign biopsy. Right breast shows a palpable 1 cm lump in the 1 o'clock position adjacent to the areolar border. She has a second lesion which is larger and further up in the breast at the 1 o'clock position measuring approximately 2.5 x 3 cm in size. Some bruising noted from recent biopsy. Steri-strip in place in upper breast, no nipple discharge or bleeding.  ECOG = 2  LABORATORY DATA:  Lab Results  Component Value Date   WBC 7.7 06/05/2017   HGB 11.0 (L) 06/05/2017   HCT 33.9 (L) 06/05/2017   MCV 83.2 06/05/2017   PLT 306 06/05/2017   NEUTROABS 4.6 06/05/2017   Lab Results  Component Value Date   NA 136 06/05/2017   K 4.2 06/05/2017   CL 104 04/13/2015   CO2 25 06/05/2017   GLUCOSE 152 (H) 06/05/2017   CREATININE 1.0 06/05/2017   CALCIUM 9.4 06/05/2017      RADIOGRAPHY: No results found.    IMPRESSION: Kiara Brown is a 76 y.o. woman with right breast invasive ductal carcinoma.   The patient has 2 separate tumors. It is doubtful she will be candidate for breast conservation therapy given the location and size of the lesions. Final decision concerning this will await Dr. Lear Ng examination and evaluation.  PLAN: The patient is a candidate for genetics based on family history. She will likely proceed with mastectomy and adjuvant chemotherapy with aromatase inhibitor . If she proceeds with mastectomy, I doubt she will require RT unless she has a large tumor or positive lymph nodes.  I spent 30 minutes face to face with the patient and more than 50% of that time was spent in counseling and/or coordination of care.   ------------------------------------------------  Blair Promise, PhD, MD  This document serves as a record of services personally performed by Gery Pray, MD. It was  created on his behalf by Maryla Morrow, a trained medical scribe. The creation of this record is based on the scribe's personal observations and the provider's statements to them. This document has been checked and approved by the attending provider.

## 2017-06-05 NOTE — Progress Notes (Signed)
Nutrition Assessment  Reason for Assessment:  Pt seen in Breast Clinic  ASSESSMENT:   76 year old female with new diagnosis of right breast cancer.  Past medical history of rhematoid arthritis, diverticulosis, fibromyalgia.    Patient reports normal appetite  Medications:  reviewed  Labs: glucose 152  Anthropometrics:   Height: 63 inches Weight: 265 lb BMI: 47.1   NUTRITION DIAGNOSIS: Food and nutrition related knowledge deficit related to new diagnosis of breast cancer as evidenced by no prior need for nutrition related information.  INTERVENTION:   Discussed and provided packet of information regarding nutritional tips for breast cancer patients.  Questions answered.  Teachback method used.  Contact information provided and patient knows to contact me with questions/concerns.   MONITORING, EVALUATION, and GOAL: Pt will consume a healthy plant based diet to maintain lean body mass throughout treatment.   Kiara Brown B. Zenia Resides, [place], [place] Registered Dietitian [phone] (pager)

## 2017-06-06 ENCOUNTER — Telehealth (HOSPITAL_COMMUNITY): Payer: Self-pay | Admitting: Vascular Surgery

## 2017-06-06 MED ORDER — ANASTROZOLE 1 MG PO TABS: 1.0000 mg | ORAL_TABLET | Freq: Every day | ORAL | 4 refills | Status: DC

## 2017-06-06 NOTE — Telephone Encounter (Signed)
Left pt message to make NEW PT BRST W/ ECHO 06/19/17

## 2017-06-06 NOTE — Progress Notes (Signed)
[place] Psychosocial Distress Screening Spiritual Care  Counselor met with patient and her husband in [facility] to introduce [place] team/resources, reviewing distress screen per protocol.  The patient scored a 6 on the Psychosocial Distress Thermometer which indicates Moderate/Severe distress. Also assessed for distress and other psychosocial needs.   Counselor reviewed distress screening with patient and found that patient's "temperature gauge" had "increased throughout the day" as BMDC went on. Counselor and pt discussed the nature of pt's anxiety and fear. Counselor discussed [place] programming with patient as a way of helping decrease patient's anxiety, but patient seemed a bit distracted (likely due to anxiety). Patient could benefit from a phone call to f/u on pt's anxiety level and emotional wellbeing, as well as remind her of [place].   Follow up needed: Yes.  Westly Pam, MS LP[facility] [phone]  [facility] DISTRESS SCREENING 06/06/2017  Screening Type Initial Screening  Distress experienced in past week (1-10) 6  Emotional problem type Nervousness/Anxiety;Adjusting to illness  Information Concerns Type Lack of info about diagnosis;Lack of info about treatment;Lack of info about complementary therapy choices  Physical Problem type Sleep/insomnia  Referral to support programs Yes

## 2017-06-07 ENCOUNTER — Telehealth (HOSPITAL_COMMUNITY): Payer: Self-pay | Admitting: Vascular Surgery

## 2017-06-07 ENCOUNTER — Telehealth: Payer: Self-pay | Admitting: Oncology

## 2017-06-07 NOTE — Telephone Encounter (Signed)
Left message with appt date and time - scheduled appt per sch message from [facility] - reminder letter sent in the mail.

## 2017-06-07 NOTE — Telephone Encounter (Signed)
Left second message to make New brst w/ echo

## 2017-06-11 ENCOUNTER — Telehealth: Payer: Self-pay | Admitting: *Deleted

## 2017-06-11 NOTE — Telephone Encounter (Signed)
Left vm regarding BMDC from 7.11.18. Contact information provided for questions.

## 2017-06-13 DIAGNOSIS — M4696 Unspecified inflammatory spondylopathy, lumbar region: Secondary | ICD-10-CM | POA: Diagnosis not present

## 2017-06-13 DIAGNOSIS — M5416 Radiculopathy, lumbar region: Secondary | ICD-10-CM | POA: Diagnosis not present

## 2017-06-13 DIAGNOSIS — M5136 Other intervertebral disc degeneration, lumbar region: Secondary | ICD-10-CM | POA: Diagnosis not present

## 2017-06-13 DIAGNOSIS — M48062 Spinal stenosis, lumbar region with neurogenic claudication: Secondary | ICD-10-CM | POA: Diagnosis not present

## 2017-06-14 ENCOUNTER — Other Ambulatory Visit: Payer: Medicare Other

## 2017-06-19 ENCOUNTER — Encounter (HOSPITAL_COMMUNITY): Payer: Self-pay | Admitting: Cardiology

## 2017-06-19 ENCOUNTER — Ambulatory Visit (HOSPITAL_BASED_OUTPATIENT_CLINIC_OR_DEPARTMENT_OTHER)
Admission: RE | Admit: 2017-06-19 | Discharge: 2017-06-19 | Disposition: A | Discharge status: Home / Self Care | Payer: Medicare Other | Source: Ambulatory Visit | Attending: Cardiology | Admitting: Cardiology

## 2017-06-19 ENCOUNTER — Ambulatory Visit (HOSPITAL_COMMUNITY)
Admission: RE | Admit: 2017-06-19 | Discharge: 2017-06-19 | Disposition: A | Discharge status: Home / Self Care | Payer: Medicare Other | Source: Ambulatory Visit | Attending: Cardiology | Admitting: Cardiology

## 2017-06-19 VITALS — BP 146/67 | HR 69 | Wt 263.8 lb

## 2017-06-19 DIAGNOSIS — E785 Hyperlipidemia, unspecified: Secondary | ICD-10-CM | POA: Insufficient documentation

## 2017-06-19 DIAGNOSIS — Z8249 Family history of ischemic heart disease and other diseases of the circulatory system: Secondary | ICD-10-CM | POA: Insufficient documentation

## 2017-06-19 DIAGNOSIS — C50211 Malignant neoplasm of upper-inner quadrant of right female breast: Secondary | ICD-10-CM | POA: Diagnosis not present

## 2017-06-19 DIAGNOSIS — K449 Diaphragmatic hernia without obstruction or gangrene: Secondary | ICD-10-CM | POA: Diagnosis not present

## 2017-06-19 DIAGNOSIS — I071 Rheumatic tricuspid insufficiency: Secondary | ICD-10-CM | POA: Insufficient documentation

## 2017-06-19 DIAGNOSIS — R0602 Shortness of breath: Secondary | ICD-10-CM | POA: Diagnosis not present

## 2017-06-19 DIAGNOSIS — Z8042 Family history of malignant neoplasm of prostate: Secondary | ICD-10-CM | POA: Insufficient documentation

## 2017-06-19 DIAGNOSIS — Z801 Family history of malignant neoplasm of trachea, bronchus and lung: Secondary | ICD-10-CM | POA: Insufficient documentation

## 2017-06-19 DIAGNOSIS — Z807 Family history of other malignant neoplasms of lymphoid, hematopoietic and related tissues: Secondary | ICD-10-CM | POA: Insufficient documentation

## 2017-06-19 DIAGNOSIS — Z170 Estrogen receptor positive status [ER+]: Secondary | ICD-10-CM

## 2017-06-19 DIAGNOSIS — Z803 Family history of malignant neoplasm of breast: Secondary | ICD-10-CM | POA: Diagnosis not present

## 2017-06-19 DIAGNOSIS — K219 Gastro-esophageal reflux disease without esophagitis: Secondary | ICD-10-CM | POA: Diagnosis not present

## 2017-06-19 DIAGNOSIS — G629 Polyneuropathy, unspecified: Secondary | ICD-10-CM | POA: Insufficient documentation

## 2017-06-19 DIAGNOSIS — Z888 Allergy status to other drugs, medicaments and biological substances status: Secondary | ICD-10-CM | POA: Insufficient documentation

## 2017-06-19 DIAGNOSIS — Z823 Family history of stroke: Secondary | ICD-10-CM | POA: Insufficient documentation

## 2017-06-19 DIAGNOSIS — M797 Fibromyalgia: Secondary | ICD-10-CM | POA: Insufficient documentation

## 2017-06-19 DIAGNOSIS — Z79899 Other long term (current) drug therapy: Secondary | ICD-10-CM | POA: Insufficient documentation

## 2017-06-19 DIAGNOSIS — Z7982 Long term (current) use of aspirin: Secondary | ICD-10-CM | POA: Diagnosis not present

## 2017-06-19 NOTE — Progress Notes (Signed)
  Echocardiogram 2D Echocardiogram has been performed.  Bobbye Charleston 06/19/2017, 11:34 AM

## 2017-06-19 NOTE — Progress Notes (Addendum)
Cardio-Oncology Clinic Consult Note   Referring Physician: Dr. Jana Hakim Primary Care: Dr. Nance Pew Primary Cardiologist: Dr. Aundra Dubin  HPI: Patient seen in cardio-oncology clinic at the request of Dr. Jana Hakim.  Kiara Brown is a 76 y.o. female with past medical history of HLD who presents today to establish in the cardio-oncology clinic for monitoring of cardio-toxicty while undergoing chemotherapy.  Cancer Profile      Initial Diagnosis   05/23/17  Mammogram and right breast US at [facility] - Category C breast density in R upper inner quadrant 2.5 cm of grouped pleomorphic calcifications - 1.2 cm macrolobulated mass R upper inner quadrant - Right axilla sonographically negative.      Surgery   Biopsy 05/27/2017  Biopsy of 2 above masses.   - Larger mass, Grade 3 DCIS. 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  - Both with strong staining intensity, with MIB-1 of 50%, and HER-2 amplified     - Awaiting definitive surgery    Treatment Plan  1. Genetics testing pending 2. Anastrozole started 06/05/2017, to be continued and minimum of 5 years 3. Definite surgery pending: Likely mastectomy 4. Trastuzumab for 6 months to be given adjuvantly 5. Adjuvant radiation as appropriate   Echo today shows EF 55-60%, GLS -20.4, LS'   She presents to clinic today for her initial visit. She is feeling good overall, but anxious concerning her new diagnosis.  At baseline, she is SOB with moderate exertion. OK walking on flat ground at pace, but SOB up steps. No SOB with ADLs. Denies orthopnea. Occasional peripheral edema that worsens throughout the day and is gone in mornings. Denies tobacco or alcohol use.  Her father passed away from a heart attack at 50. He did not follow up with MDs regularly. No other cardiac history that she knows of, and no personal history.   Review of  Systems: [y] = yes, _0  = no   General: Weight gain _1 ; Weight loss _2 ; Anorexia _3 ; Fatigue _4 ; Fever _5 ; Chills _6 ; Weakness _7   Cardiac: Chest pain/pressure _8 ; Resting SOB _9 ; Exertional SOB [y]; Orthopnea _10 ; Pedal Edema [y]; Palpitations _11 ; Syncope _12 ; Presyncope _13 ; Paroxysmal nocturnal dyspnea_14   Pulmonary: Cough _15 ; Wheezing_16 ; Hemoptysis_17 ; Sputum _18 ; Snoring _19   GI: Vomiting_20 ; Dysphagia_21 ; Melena_22 ; Hematochezia _23 ; Heartburn_24 ; Abdominal pain _25 ; Constipation _26 ; Diarrhea _27 ; BRBPR _28   GU: Hematuria_29 ; Dysuria _30 ; Nocturia_31   Vascular: Pain in legs with walking _32 ; Pain in feet with lying flat _33 ; Non-healing sores _34 ; Stroke _35 ; TIA _36 ; Slurred speech _37 ;  Neuro: Headaches_38 ; Vertigo_39 ; Seizures_40 ; Paresthesias_41 ;Blurred vision _42 ; Diplopia _43 ; Vision changes _44   Ortho/Skin: Arthritis [y]; Joint pain [y]; Muscle pain _45 ; Joint swelling _46 ; Back Pain _47 ; Rash _48   Psych: Depression_49 ; Anxiety[y]  Heme: Bleeding problems _50 ; Clotting disorders _51 ; Anemia _52   Endocrine: Diabetes _53 ; Thyroid dysfunction_54    Past Medical History:  Diagnosis Date  . Arthritis    osteo, rheumatoid  . Diverticulosis   . Dysrhythmia    hx rapid heartbeat, no cardiologist  . Fibromyalgia    peripheral neuropathy  . Fibromyalgia   . GERD (gastroesophageal reflux disease)   .  H/O hiatal hernia   . Hyperlipidemia   . PONV (postoperative nausea and vomiting)    "THROAT WITH EXTREME LHTDSKA"7681, ulnar nerve surgery  . Sciatica   . Shortness of breath dyspnea    with exertion  . UTI (lower urinary tract infection)     Current Outpatient Prescriptions  Medication Sig Dispense Refill  . Abatacept (ORENCIA IV) Inject 1,000 mg into the vein every 30 (thirty) days.    Marland Kitchen acetaminophen (TYLENOL) 500 MG tablet Take 500 mg by mouth every 6 (six) hours as needed (Pain). Pain    . amitriptyline (ELAVIL) 10 MG tablet Take 10 mg by mouth at bedtime as  needed for sleep.    Marland Kitchen anastrozole (ARIMIDEX) 1 MG tablet Take 1 tablet (1 mg total) by mouth daily. 90 tablet 4  . aspirin 81 MG chewable tablet Chew by mouth daily.    Marland Kitchen atenolol (TENORMIN) 100 MG tablet Take 50 mg by mouth daily after breakfast.     . buPROPion (WELLBUTRIN XL) 150 MG 24 hr tablet Take 150 mg by mouth daily before breakfast.    . cyclobenzaprine (FLEXERIL) 5 MG tablet Take 5 mg by mouth at bedtime as needed for muscle spasms.    Marland Kitchen docusate sodium (COLACE) 100 MG capsule Take 1 capsule (100 mg total) by mouth 2 (two) times daily. (Patient taking differently: Take 100 mg by mouth 2 (two) times daily as needed. ) 10 capsule 0  . folic acid (FOLVITE) 1 MG tablet Take 1 mg by mouth daily.    Marland Kitchen omeprazole (PRILOSEC) 40 MG capsule Take 40 mg by mouth every morning.    . polyethylene glycol (MIRALAX / GLYCOLAX) packet Take 17 g by mouth daily as needed for mild constipation. 14 each 0  . ondansetron (ZOFRAN) 4 MG tablet Take 1 tablet (4 mg total) by mouth every 6 (six) hours as needed for nausea. (Patient not taking: Reported on 06/05/2017) 40 tablet 0   No current facility-administered medications for this encounter.     Allergies  Allergen Reactions  . Duloxetine Other (See Comments)    unknown  . Oseltamivir Diarrhea    Abdominal pain Abdominal pain  . Nsaids Rash      Social History   Social History  . Marital status: Married    Spouse name: N/A  . Number of children: N/A  . Years of education: N/A   Occupational History  . Not on file.   Social History Main Topics  . Smoking status: Never Smoker  . Smokeless tobacco: Never Used  . Alcohol use No  . Drug use: No  . Sexual activity: Not on file   Other Topics Concern  . Not on file   Social History Narrative  . No narrative on file      Family History  Problem Relation Age of Onset  . Hypertension Mother   . Stroke Mother   . Breast cancer Mother   . Hypertension Father        MI  . Hypertension  Brother        MI  . Prostate cancer Brother   . Hypertension Brother        MI  . Lung cancer Brother   . Hypertension Brother        MI  . Breast cancer Sister   . Non-Hodgkin's lymphoma Sister    Vitals:   06/19/17 1144  BP: (!) 146/67  Pulse: 69  SpO2: 97%  Weight: 263 lb 12.8 oz (  119.7 kg)   Physical Exam    General:  Anxious appearing. Obese. NAD.  HEENT: normal Neck: supple. no JVD. Carotids 2+ bilat; no bruits. No lymphadenopathy or thyromegaly appreciated. Cor: PMI nondisplaced. Regular rate & rhythm. No rubs, gallops or murmurs. Lungs: clear Abdomen: Obese, soft, nontender, nondistended. No hepatosplenomegaly. No bruits or masses. Good bowel sounds. Extremities: no cyanosis, clubbing, rash, edema Neuro: alert & oriented x 3, cranial nerves grossly intact. moves all 4 extremities w/o difficulty. Affect pleasant.  Pt Profile   Kiara Brown is a 76 y.o. female with past medical history of HLD who presented 06/19/17 to establish in the cardio-oncology clinic for monitoring of cardio-toxicty while undergoing chemotherapy.  Assessment/Plan     1. Breast cancer of the right breast, female - Echo today shows LVEF 55-60%, GLS - 20.4  Explained incidence of Herceptin cardiotoxicity and role of Cardio-oncology clinic at length. Echo images reviewed personally by Dr. Aundra Dubin. All parameters stable. Reviewed signs and symptoms of HF to look for. Ok to begin chemotherapy. Follow-up with echo in 3 months.   Shirley Friar, PA-C 06/19/17   Patient seen with PA, agree with the above note.    Echo was reviewed today, normal EF and strain pattern (baseline prior to chemo).    We discussed the role of echo screening while getting treatment with Herceptin.    She will be beginning Herceptin-based therapy soon. We will have her back in 3 months for repeat echo and office visit.  Loralie Champagne 06/19/2017

## 2017-06-19 NOTE — Addendum Note (Signed)
Encounter addended by: Larey Dresser, MD on: 06/19/2017 10:55 PM<BR>    Actions taken: LOS modified, Sign clinical note

## 2017-06-19 NOTE — Patient Instructions (Signed)
Echo and Follow up in 3 Months 

## 2017-06-25 ENCOUNTER — Other Ambulatory Visit: Payer: Medicare Other

## 2017-06-25 ENCOUNTER — Ambulatory Visit (HOSPITAL_BASED_OUTPATIENT_CLINIC_OR_DEPARTMENT_OTHER): Payer: Medicare Other | Admitting: Genetics

## 2017-06-25 ENCOUNTER — Encounter: Payer: Self-pay | Admitting: Genetics

## 2017-06-25 DIAGNOSIS — Z803 Family history of malignant neoplasm of breast: Secondary | ICD-10-CM | POA: Diagnosis not present

## 2017-06-25 DIAGNOSIS — Z8042 Family history of malignant neoplasm of prostate: Secondary | ICD-10-CM

## 2017-06-25 DIAGNOSIS — Z809 Family history of malignant neoplasm, unspecified: Secondary | ICD-10-CM | POA: Diagnosis not present

## 2017-06-25 DIAGNOSIS — C50211 Malignant neoplasm of upper-inner quadrant of right female breast: Secondary | ICD-10-CM | POA: Diagnosis not present

## 2017-06-25 DIAGNOSIS — Z171 Estrogen receptor negative status [ER-]: Secondary | ICD-10-CM | POA: Diagnosis not present

## 2017-06-25 DIAGNOSIS — Z7183 Encounter for nonprocreative genetic counseling: Secondary | ICD-10-CM | POA: Diagnosis not present

## 2017-06-25 NOTE — Progress Notes (Addendum)
REFERRING PROVIDER: Chauncey Cruel, MD [address] [place], [place] [address]  PRIMARY PROVIDER:  Burman Freestone, MD  PRIMARY REASON FOR VISIT:  1. Malignant neoplasm of upper-inner quadrant of right breast in female, estrogen receptor negative ([place])   2. Family history of breast cancer   3. Family history of prostate cancer     HISTORY OF PRESENT ILLNESS:   Kiara Brown, a 76 y.o. female, was seen for a [facility] cancer genetics consultation at the request of Dr. Jana Hakim due to a personal and family history of cancer.  Kiara Brown presents to clinic today to discuss the possibility of a hereditary predisposition to cancer, genetic testing, and to further clarify her future cancer risks, as well as potential cancer risks for family members.   In 2018, at the age of 23, Kiara Brown was diagnosed with DCIS and Invasive ductal carcinoma (ER/PR +, HER2+) of the right breast. She is having a mastectomy and infusions to treat this diagnosis.     HORMONAL RISK FACTORS:  Menarche was at age 77.  First live birth at age 63.  OCP use for approximately 12 years.  Ovaries intact: yes, 1 ovary intact.  Hysterectomy: yes.  Menopausal status: postmenopausal.  HRT use: 10 years. Ending in 2002 Colonoscopy: yes; normal. Mammogram within the last year: yes. Up to date with pelvic exams:  yes.  Past Medical History:  Diagnosis Date  . Arthritis    osteo, rheumatoid  . Breast cancer ([place])   . Diverticulosis   . Dysrhythmia    hx rapid heartbeat, no cardiologist  . Family history of breast cancer   . Family history of prostate cancer   . Fibromyalgia    peripheral neuropathy  . Fibromyalgia   . GERD (gastroesophageal reflux disease)   . H/O hiatal hernia   . Hyperlipidemia   . PONV (postoperative nausea and vomiting)    "THROAT WITH EXTREME CNOBSJG"2836, ulnar nerve surgery  . Sciatica   . Shortness of breath dyspnea    with exertion  . UTI (lower urinary tract  infection)     Past Surgical History:  Procedure Laterality Date  . ABDOMINAL HYSTERECTOMY    . APPENDECTOMY    . BREAST SURGERY     biopsy  . CARDIAC CATHETERIZATION  2009   "no blockage per pt"  . CHOLECYSTECTOMY    . CYSTOSCOPY WITH BIOPSY    . INCONTINENCE SURGERY    . KNEE ARTHROSCOPY     bilateral  . KNEE ARTHROSCOPY Left 03/17/2014   Procedure: ARTHROSCOPY LEFT KNEE WITH SYNOVECTOMY;  Surgeon: Gearlean Alf, MD;  Location: [facility];  Service: Orthopedics;  Laterality: Left;  . ROTATOR CUFF REPAIR     right  . TOTAL KNEE ARTHROPLASTY  08/04/2012   Procedure: TOTAL KNEE ARTHROPLASTY;  Surgeon: Gearlean Alf, MD;  Location: [facility];  Service: Orthopedics;  Laterality: Left;  . TOTAL KNEE ARTHROPLASTY Right 04/11/2015   Procedure: RIGHT TOTAL KNEE ARTHROPLASTY;  Surgeon: Gaynelle Arabian, MD;  Location: [facility];  Service: Orthopedics;  Laterality: Right;  . ulnar nerve surgery Right 1986    Social History   Social History  . Marital status: Married    Spouse name: N/A  . Number of children: N/A  . Years of education: N/A   Social History Main Topics  . Smoking status: Never Smoker  . Smokeless tobacco: Never Used  . Alcohol use No  . Drug use: No  . Sexual activity: Not Asked  Other Topics Concern  . None   Social History Narrative  . None     FAMILY HISTORY:  We obtained a detailed, 4-generation family history.  Significant diagnoses are listed below: Family History  Problem Relation Age of Onset  . Hypertension Mother   . Stroke Mother   . Breast cancer Mother 76       died at 11  . Hypertension Father   . Heart attack Father        died at 29  . Hypertension Brother        MI  . Prostate cancer Brother 83  . Hypertension Brother        MI  . Lung cancer Brother        possible lung cancer, spot found on lung being monitored  . Hypertension Brother        MI  . Non-Hodgkin's lymphoma Sister 26  . Breast cancer Other 24       is currently 90  .  Breast cancer Other 55  . Cancer Cousin 62       type of cancer unk.   . Cancer Other        type unknown, eye/face   . Cancer Other 37       died from cancer in his 91's, type unknown   Kiara Brown had 3 sons.  She had a set of female  twins.  One died at birth, and the other son is 53 with no history of cancer.  This son has an 44 year-old daughter.  Kiara Brown has a 74 year-old son with no children or any history of cancer.   Kiara Brown has a maternal half-sister, 1 full sisters, and 5 full brothers.  They are listed below: -maternal half sister is 19.  She had breast cancer at 68.  This sister has 4 children: 1 daughter was diagnosed with breast cancer in her 77's and is now in her 38's.  (She has 3 daughters), 1 daughter is 36 and has had an eye/face cancer.  The specific type is unknown (she has 2 children), 1 son has no history of cancer and is in his 14's.  1 son died in his 33's from cancer, but the type is unknown.  (He had 5 children) -1 full sister had non-hodgkins lymphoma at 71 and is now 42.  She has a 77 year-old son with no history of cancer.  -1 full sister is 38 with no history of cancer.  She has 2 sons and a daughter in their 59's with no history of cancer.  -1 full brother died at 66 from heart disease.  He had 3 children with no history of cancer.  -1 full brother was diagnosed with prostate cancer at 37.  He is now 40 and has a son (13) and daughter (74) with no history of cancer.  -1 full brother died in his 69's/80's from heart disease.  He had 4 children with no history of cancer.  -1 full brother died in his 35's/80's due to heart disease and had 3 children who have no history of cancer.  -1 full brother is 55.  He has heart disease and recently was found to have a spot on his lung- they are unsure at this time if it is lung cancer or not.  He has a daughter and a son in their 27's with no history of cancer.   Ms. Rud father died at 31 due to a heart  attack. Ms.  Uram has 4 maternal aunts.  One paternal aunt has a daughter (patient's cousin) who died at 45 due to cancer.  The type of cancer is unknown.  This cousin has 3 children with no history of cancer. There is no other known history of cancer for there other paternal aunts.   No known history of cancer in any other paternal cousins.  Ms. Freundlich paternal grandfather died in his late 45's. Ms. Pensabene paternal grandmother died in her 46's with kidney cancer.  The patient is unsure if there is any history other of cancer on this side of the family.   Ms. Bogucki mother had breast cancer at 71 and died of heart disease at 38.  Ms. Balducci has 5 maternal uncles with no history of cancer and 3 maternal aunts with no history of cancer.  Ms. Landgren has many maternal cousins, but she does not know much information about these relatives.  Ms. Malphrus does not know much information about her maternal grandparents.  She believes they died at older ages, and is not aware of any history of cancer.   Ms. Leggitt is unaware of previous family history of genetic testing for hereditary cancer risks. Patient's maternal ancestors are of Northern European descent, and paternal ancestors are of English/Northern European descent. There is no reported Ashkenazi Jewish ancestry. There is no known consanguinity.  GENETIC COUNSELING ASSESSMENT: ANDRIAN URBACH is a 76 y.o. female with a personal and family history which is somewhat suggestive of a Hereditary Cancer Predisposition Syndrome. We, therefore, discussed and recommended the following at today's visit.   DISCUSSION: We reviewed the characteristics, features and inheritance patterns of hereditary cancer syndromes. We also discussed genetic testing, including the appropriate family members to test, the process of testing, insurance coverage and turn-around-time for results. We discussed the implications of a negative, positive and/or variant of uncertain significant result.  We discussed the different genetic testing options including testing for breast cancer risk genes as well as a larger panel of genes.  Ms. Hippert decided to pursue genetic testing for the [facility] Breast Cancer gene panel. The [facility] Breast Cancer Panel analyzes the following 26 genes associated with breast cancer risk: ATM, BARD1, BRCA1, BRCA2, BRIP1, CDH1, CHEK2, NBN, NF1, PALB2, PTEN, RAD50, STK11, TP53, AKT1, FAM175A, FANCC, MRE11, MUTYH, PIK3CA, RAD51C, RAD51D, RINT1, SDHB, SDHD, XRCC2.  She is undecided on weather she wants to learn more about other cancer risks outside of breast cancer.  We discussed that there the known cancer diagnosis in her family are primarily breast cancers, however there are some cancer diagnosis in the family for which the type of cancer is unknown.  We informed her that she has the option to pursue additional reflex genetic testing for a larger panel after receiving her initial genetic test results.   Ms. Kerman also indicated that she does not believe she would make a different surgical decision based on these test results and did not express a need to have the results prior to her surgery.   We discussed that only 5-10% of cancers are associated with a Hereditary cancer predisposition syndrome.  One of the most common hereditary cancer syndromes that increases breast cancer risk is called Hereditary Breast and Ovarian Cancer (HBOC) syndrome.  This syndrome is caused by mutations in the BRCA1 and BRCA2 genes.  This syndrome increases an individual's lifetime risk to develop breast, ovarian, pancreatic, and other types of cancer.  There are also many other cancer predisposition syndromes caused  by mutations in several other genes.  We discussed that if she is found to have a mutation in one of these genes, it may impact future medical management recommendations such as increased cancer screenings and consideration of risk reducing surgeries.  A positive result could also have  implications for the patient's family members.  A Negative result would mean we were unable to identify a hereditary component to her cancer, but does not rule out the possibility of a hereditary basis for her cancer.  There could be mutations that are undetectable by current technology, or in genes not yet tested or identified to increase cancer risk.    We discussed the potential to find a Variant of Uncertain Significance or VUS.  These are variants that have not yet been identified as pathogenic or benign, and it is unknown if this variant is associated with increased cancer risk or if this is a normal finding.  Most VUS's are reclassified to benign or likely benign.   It should not be used to make medical management decisions. With time, we suspect the lab will determine the significance of any VUS's identified if any.   Based on Ms. Leh's personal and family history of cancer, she meets medical criteria for genetic testing. Despite that she meets criteria, she may still have an out of pocket cost. We discussed that if her out of pocket cost for testing is over $100, the laboratory will call and confirm whether she wants to proceed with testing.  If the out of pocket cost of testing is less than $100 she will be billed by the genetic testing laboratory.   PLAN: After considering the risks, benefits, and limitations, Ms. Loper  provided informed consent to pursue genetic testing and the blood sample was sent to [facility] for analysis of the Breast Cancer Panel. Results should be available within approximately 2-3 weeks' time, at which point they will be disclosed by telephone to Ms. Bartles, as will any additional recommendations warranted by these results. Ms. Reichart will receive a summary of her genetic counseling visit and a copy of her results once available. This information will also be available in Epic. We encouraged Ms. Ahmed to remain in contact with cancer genetics annually so  that we can continuously update the family history and inform her of any changes in cancer genetics and testing that may be of benefit for her family. Ms. Bradby questions were answered to her satisfaction today. Our contact information was provided should additional questions or concerns arise.  Based on Ms. Beckom's family history, we recommended her sister with breast cancer at 80 as well as her niece with breast cancer in her 23's, have genetic counseling and testing. Ms. Llerenas will let us know if we can be of any assistance in coordinating genetic counseling and/or testing for this family member.   Lastly, we encouraged Ms. Gautney to remain in contact with cancer genetics annually so that we can continuously update the family history and inform her of any changes in cancer genetics and testing that may be of benefit for this family.   Ms.  Clum questions were answered to her satisfaction today. Our contact information was provided should additional questions or concerns arise. Thank you for the referral and allowing [place] to share in the care of your patient.   Tana Felts, MS Genetic Counselor [email or web address] phone: [phone]  The patient was seen for a total of 45 minutes in face-to-face genetic counseling.

## 2017-06-26 NOTE — Pre-Procedure Instructions (Signed)
Kiara Brown  06/26/2017      [facility] - [address], [place] - [address] [place] [address] Phone: [phone] Fax: [phone]    Your procedure is scheduled on July 02, 2017.  Report to [facility] Admitting at 1030 AM.  Call this number if you have problems the morning of surgery:  [phone]   Remember:  Do not eat food or drink liquids after midnight.  Take these medicines the morning of surgery with A SIP OF WATER acetaminophen (tylenol), amitriptyline (elavil), anastrozole (arimidex), atenolol (tenormin), bupropion (wellbutrin), cyclobenzaprine (flexeril), docusate sodium (colace), omeprazole (prilosec), ondansetron (zofran), tramadol (ultram)-if needed for pain  7 days prior to surgery STOP taking any Aspirin, Aleve, Naproxen, Ibuprofen, Motrin, Advil, Goody's, BC's, all herbal medications, fish oil, and all vitamins   Do not wear jewelry, make-up or nail polish.  Do not wear lotions, powders, or perfumes, or deoderant.  Do not shave 48 hours prior to surgery.    Do not bring valuables to the hospital.  [facility] is not responsible for any belongings or valuables.  Contacts, dentures or bridgework may not be worn into surgery.  Leave your suitcase in the car.  After surgery it may be brought to your room.  For patients admitted to the hospital, discharge time will be determined by your treatment team.  Patients discharged the day of surgery will not be allowed to drive home.   Special instructions:   [place]- Preparing For Surgery  Before surgery, you can play an important role. Because skin is not sterile, your skin needs to be as free of germs as possible. You can reduce the number of germs on your skin by washing with CHG (chlorahexidine gluconate) Soap before surgery.  CHG is an antiseptic cleaner which kills germs and bonds with the skin to continue killing germs even after  washing.  Please do not use if you have an allergy to CHG or antibacterial soaps. If your skin becomes reddened/irritated stop using the CHG.  Do not shave (including legs and underarms) for at least 48 hours prior to first CHG shower. It is OK to shave your face.  Please follow these instructions carefully.   1. Shower the NIGHT BEFORE SURGERY and the MORNING OF SURGERY with CHG.   2. If you chose to wash your hair, wash your hair first as usual with your normal shampoo.  3. After you shampoo, rinse your hair and body thoroughly to remove the shampoo.  4. Use CHG as you would any other liquid soap. You can apply CHG directly to the skin and wash gently with a scrungie or a clean washcloth.   5. Apply the CHG Soap to your body ONLY FROM THE NECK DOWN.  Do not use on open wounds or open sores. Avoid contact with your eyes, ears, mouth and genitals (private parts). Wash genitals (private parts) with your normal soap.  6. Wash thoroughly, paying special attention to the area where your surgery will be performed.  7. Thoroughly rinse your body with warm water from the neck down.  8. DO NOT shower/wash with your normal soap after using and rinsing off the CHG Soap.  9. Pat yourself dry with a CLEAN TOWEL.   10. Wear CLEAN PAJAMAS   11. Place CLEAN SHEETS on your bed the night of your first shower and DO NOT SLEEP WITH PETS.    Day  of Surgery: Do not apply any deodorants/lotions. Please wear clean clothes to the hospital/surgery center.     Please read over the following fact sheets that you were given. Pain Booklet, Coughing and Deep Breathing and Surgical Site Infection Prevention

## 2017-06-27 ENCOUNTER — Other Ambulatory Visit (HOSPITAL_COMMUNITY): Payer: Medicare Other

## 2017-06-27 ENCOUNTER — Encounter (HOSPITAL_COMMUNITY): Payer: Self-pay

## 2017-06-27 ENCOUNTER — Encounter (HOSPITAL_COMMUNITY)
Admission: RE | Admit: 2017-06-27 | Discharge: 2017-06-27 | Disposition: A | Discharge status: Home / Self Care | Payer: Medicare Other | Source: Ambulatory Visit | Attending: General Surgery | Admitting: General Surgery

## 2017-06-27 DIAGNOSIS — E785 Hyperlipidemia, unspecified: Secondary | ICD-10-CM | POA: Diagnosis not present

## 2017-06-27 DIAGNOSIS — Z01812 Encounter for preprocedural laboratory examination: Secondary | ICD-10-CM | POA: Insufficient documentation

## 2017-06-27 DIAGNOSIS — Z79811 Long term (current) use of aromatase inhibitors: Secondary | ICD-10-CM | POA: Insufficient documentation

## 2017-06-27 DIAGNOSIS — Z79899 Other long term (current) drug therapy: Secondary | ICD-10-CM | POA: Insufficient documentation

## 2017-06-27 DIAGNOSIS — C50919 Malignant neoplasm of unspecified site of unspecified female breast: Secondary | ICD-10-CM | POA: Insufficient documentation

## 2017-06-27 DIAGNOSIS — K219 Gastro-esophageal reflux disease without esophagitis: Secondary | ICD-10-CM | POA: Diagnosis not present

## 2017-06-27 DIAGNOSIS — Z96653 Presence of artificial knee joint, bilateral: Secondary | ICD-10-CM | POA: Diagnosis not present

## 2017-06-27 DIAGNOSIS — Z7982 Long term (current) use of aspirin: Secondary | ICD-10-CM | POA: Insufficient documentation

## 2017-06-27 DIAGNOSIS — Z01818 Encounter for other preprocedural examination: Secondary | ICD-10-CM | POA: Diagnosis not present

## 2017-06-27 LAB — BASIC METABOLIC PANEL
Anion gap: 12 (ref 5–15)
BUN: 13 mg/dL (ref 6–20)
CALCIUM: 8.7 mg/dL — AB (ref 8.9–10.3)
CO2: 23 mmol/L (ref 22–32)
CREATININE: 1.05 mg/dL — AB (ref 0.44–1.00)
Chloride: 99 mmol/L — ABNORMAL LOW (ref 101–111)
GFR calc Af Amer: 58 mL/min — ABNORMAL LOW (ref >60)
GFR, EST NON AFRICAN AMERICAN: 50 mL/min — AB (ref >60)
GLUCOSE: 248 mg/dL — AB (ref 65–99)
Potassium: 3.7 mmol/L (ref 3.5–5.1)
Sodium: 134 mmol/L — ABNORMAL LOW (ref 135–145)

## 2017-06-27 LAB — CBC
HCT: 34.3 % — ABNORMAL LOW (ref 36.0–46.0)
Hemoglobin: 10.9 g/dL — ABNORMAL LOW (ref 12.0–15.0)
MCH: 26.8 pg (ref 26.0–34.0)
MCHC: 31.8 g/dL (ref 30.0–36.0)
MCV: 84.5 fL (ref 78.0–100.0)
Platelets: 300 10*3/uL (ref 150–400)
RBC: 4.06 MIL/uL (ref 3.87–5.11)
RDW: 16.1 % — AB (ref 11.5–15.5)
WBC: 9.6 10*3/uL (ref 4.0–10.5)

## 2017-06-27 NOTE — Progress Notes (Signed)
Pt. Denies cardiac care prior to this diagnosis, ECHOCARDIOGRAM- 05/2017, in prep for chemo. Pt. Followed for PCP-  Dr. Nance Pew, requested EKG. Pt. Denies all chest concerns today.  Pt. Given instructions relative to ERAS- pt. Verbalizes understanding.

## 2017-06-28 ENCOUNTER — Telehealth: Payer: Self-pay

## 2017-06-28 NOTE — Progress Notes (Addendum)
Anesthesia Chart Review:  Pt is a 76 year old female scheduled for R total mastectomy with axillary sentinel lymph node biopsy, insertion of port-a-cath on 07/02/2017 with Excell Seltzer, MD  - PCP is Desiree Hane, MD - Oncologist is Lurline Del, MD - Established care with Loralie Champagne, MD with cardiology in cardio-oncology clinic for chemo cardiotoxicity monitoring on 06/19/17.   PMH includes:  Dysrhythmia (tachycardia, tx by PCP with atenolol), hyperlipidemia, breast cancer, post-op N/V, GERD.  Never smoker. BMI 47. S/p R TKA 04/11/15. S/p L TKA 08/04/12.   Medications include: arimidex, ASA 81mg , atenolol, prilosec.    BP (!) 116/50   Pulse 81   Temp 36.6 C   Resp 20   Ht 5\' 3"  (1.6 m)   Wt 264 lb 14.4 oz (120.2 kg)   SpO2 97%   BMI 46.92 kg/m    Preoperative labs reviewed.  Glucose 248.  Pt does not have DM.  - I notified Armen in Dr. Lear Ng office of potential new DM diagnosis.   EKG requested from PCP's office. If it does not arrive, EKG will be obtained DOS.   Echo 06/19/17:  - Left ventricle: The cavity size was normal. Wall thickness was increased in a pattern of mild LVH. Systolic function was normal. The estimated ejection fraction was in the range of 60% to 65%. Wall motion was normal; there were no regional wall motion abnormalities. Normal GLPSS at -20%. Doppler parameters are consistent with abnormal left ventricular relaxation (grade 1 diastolic dysfunction). The E/e&' ratio is between 8-15, suggesting indeterminate LV filling pressure. - Left atrium: The atrium was normal in size. - Tricuspid valve: There was trivial regurgitation. - Inferior vena cava: The vessel was normal in size. The respirophasic diameter changes were in the normal range (>= 50%), consistent with normal central venous pressure. - Impressions:  LVEF 60-65%, mild LVH, normal wall motion, normal GLPSS at -20%, grade 1 DD, indeterminate LV filling pressure, normal LA size, normal IVC.  If  blood glucose and EKG acceptable DOS, I anticipate pt can proceed as scheduled.   Willeen Cass, FNP-BC [facility] Short Stay Surgical Center/Anesthesiology Phone: [phone] 07/01/2017 4:33 PM

## 2017-06-28 NOTE — Telephone Encounter (Signed)
Pt was asking for an rx for post surgical mastectomy undergarments. No answer when called back.  Known information about post mastectomy camisole. Can get fitted before surgery and put garment on hold then pick it up the day released from hospital and can submit to insurance. If want it before surgery she can pay cash.

## 2017-07-01 MED ORDER — GABAPENTIN 300 MG PO CAPS
300.0000 mg | ORAL_CAPSULE | ORAL | Status: AC
  Administered 2017-07-02: 300 mg via ORAL
  Filled 2017-07-01: qty 1

## 2017-07-01 MED ORDER — DEXTROSE 5 % IV SOLN
3.0000 g | INTRAVENOUS | Status: AC
  Administered 2017-07-02: 3 g via INTRAVENOUS
  Filled 2017-07-01: qty 3000

## 2017-07-01 MED ORDER — ACETAMINOPHEN 500 MG PO TABS
1000.0000 mg | ORAL_TABLET | ORAL | Status: AC
  Administered 2017-07-02: 1000 mg via ORAL
  Filled 2017-07-01: qty 2

## 2017-07-01 NOTE — Telephone Encounter (Signed)
lvm with information in Friday's note. Asked pt to call back.

## 2017-07-01 NOTE — H&P (Signed)
History of Present Illness Kiara Brown T. Kharisma Glasner MD; 06/05/2017 4:10 PM) The patient is a 76 year old female who presents with breast cancer. She is a post menopausal female referred by Dr. Marcelo Baldy for evaluation of recently diagnosed carcinoma of the right breast. She recently was able to feel 2 lumps in her right breast about 3-4 weeks ago. Her mammogram was do so she presented for evaluation at [facility]. Subsequent imaging included diagnostic mamogram showing a 2.5 cm area of grouped pleomorphic calcifications in the upper inner right breast and a 1.2 cm oval density in the retroareolar region and ultrasound showing a hypoechoic irregular mass 2.5 cm in the upper inner breast corresponding to the calcifications and a 1.2 cm solid mass in the retroareolar region.. 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 She is seen now in breast multidisciplinary clinic for initial treatment planning. She has experienced lumps as above but no skin changes or nipple discharge or pain. She does not have a personal history of any previous breast problems. she has a significaer hid her sister in their 19s.eloping in her mother and her sister in their 70s.  Findings at that time were the following: Tumor size: 2.5 and 1.2 cm Tumor grade: 3 invasive and high-grade DCIS Estrogen Receptor: negative in DCIS and 95% positive in invasive Progesterone Receptor: negative in DCIS and 80% positive and invasive component Her-2 neu: positive Lymph node status: negative    Past Surgical History Tawni Pummel, RN; 06/05/2017 7:37 AM) Appendectomy  Breast Biopsy  Bilateral. Colon Polyp Removal - Colonoscopy  Gallbladder Surgery - Laparoscopic  Hysterectomy (not due to cancer) - Partial  Knee Surgery  Bilateral. Oral Surgery  Shoulder  Surgery  Right.  Diagnostic Studies History Tawni Pummel, RN; 06/05/2017 7:37 AM) Colonoscopy  1-5 years ago Mammogram  within last year Pap Smear  1-5 years ago  Medication History Tawni Pummel, RN; 06/05/2017 7:38 AM) Medications Reconciled  Social History Tawni Pummel, RN; 06/05/2017 7:37 AM) Caffeine use  Tea. No alcohol use  No drug use  Tobacco use  Never smoker.  Family History Tawni Pummel, RN; 06/05/2017 7:37 AM) Arthritis  Brother, Mother. Breast Cancer  Mother, Sister. Cerebrovascular Accident  Brother, Mother. Diabetes Mellitus  Brother, Mother, Sister. Heart Disease  Brother, Mother. Heart disease in female family member before age 22  Hypertension  Brother, Mother, Sister. Kidney Disease  Sister. Prostate Cancer  Brother. Thyroid problems  Sister.  Pregnancy / Birth History Tawni Pummel, RN; 06/05/2017 7:37 AM) Age at menarche  16 years. Age of menopause  <45 Contraceptive History  Intrauterine device, Oral contraceptives. Gravida  3 Irregular periods  Length (months) of breastfeeding  7-12 Maternal age  2-30 Para  2  Other Problems Tawni Pummel, RN; 06/05/2017 7:37 AM) Arthritis  Back Pain  Bladder Problems  Breast Cancer  Cholelithiasis  Diverticulosis  Gastroesophageal Reflux Disease  Lump In Breast  Oophorectomy  Left. Thyroid Disease  Ventral Hernia Repair     Review of Systems Sunday Spillers Ledford RN; 06/05/2017 7:37 AM) General Present- Weight Gain. Not Present- Appetite Loss, Chills, Fatigue, Fever, Night Sweats and Weight Loss. Skin Not Present- Change in Wart/Mole, Dryness, Hives, Jaundice, New Lesions, Non-Healing Wounds, Rash and Ulcer. HEENT Present- Wears glasses/contact lenses. Not Present- Earache, Hearing Loss, Hoarseness, Nose Bleed, Oral Ulcers, Ringing in the Ears, Seasonal Allergies, Sinus  Pain, Sore Throat, Visual Disturbances and Yellow Eyes. Respiratory Present- Difficulty  Breathing and Snoring. Not Present- Bloody sputum, Chronic Cough and Wheezing. Breast Present- Breast Mass. Not Present- Breast Pain, Nipple Discharge and Skin Changes. Cardiovascular Present- Leg Cramps, Shortness of Breath and Swelling of Extremities. Not Present- Chest Pain, Difficulty Breathing Lying Down, Palpitations and Rapid Heart Rate. Gastrointestinal Not Present- Abdominal Pain, Bloating, Bloody Stool, Change in Bowel Habits, Chronic diarrhea, Constipation, Difficulty Swallowing, Excessive gas, Gets full quickly at meals, Hemorrhoids, Indigestion, Nausea, Rectal Pain and Vomiting. Female Genitourinary Not Present- Frequency, Nocturia, Painful Urination, Pelvic Pain and Urgency. Musculoskeletal Present- Back Pain, Joint Pain, Joint Stiffness, Muscle Pain and Swelling of Extremities. Not Present- Muscle Weakness. Neurological Present- Tingling and Weakness. Not Present- Decreased Memory, Fainting, Headaches, Numbness, Seizures, Tremor and Trouble walking. Psychiatric Present- Change in Sleep Pattern. Not Present- Anxiety, Bipolar, Depression, Fearful and Frequent crying. Hematology Present- Blood Thinners. Not Present- Easy Bruising, Excessive bleeding, Gland problems, HIV and Persistent Infections.   Physical Exam Kiara Brown T. Oma Alpert MD; 06/05/2017 4:13 PM) The physical exam findings are as follows: Note:General: Alert, oderately obese Caucasian female, in no distress Skin: Warm and dry without rash or infection. HEENT: No palpable masses or thyromegaly. Sclera nonicteric. Pupils equal round and reactive. Lymph nodes: No cervical, supraclavicular, nodes palpable. Breasts: Relatively small breasts bilaterally. On the right there is a firm approximately 2.5 cm mass palpable in the upper inner breast and a approximately 1.5 cm firm freely movable mass palpable in the areolar border at about 12:00. No skin changes. Left breast negative. No palpable axillary adenopathy. Lungs: Breath sounds  clear and equal. No wheezing or increased work of breathing. Cardiovascular: Regular rate and rhythm without murmer. Abdomen: Nondistended. Soft and nontender. No masses palpable. No organomegaly. No palpable hernias. Extremities: 3+ bilateral lower extremity lymphedema greater on the left than the right. Neurologic: Alert and fully oriented. Gait somewhat stiff and difficult. No focal weakness. Psychiatric: Normal mood and affect. Thought content appropriate with normal judgement and insight    Assessment & Plan Kiara Brown T. 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 I discussed with the patient and her husband present today initial surgical treatment options. We discussed options of breast conservation with lumpectomy or total mastectomy and sentinal lymph node biopsy/dissection. Options for reconstruction were discussed. I do not believe she is antric diseaser breast conservation due to multi centric disease and relatively small breast size as well as subareolar position of her tumor. After discussion they understand and agree to proceed with total mastectomy. she is not interested in reconstruction We discussed the indications and nature of the procedure, and expected recovery, in detail. Surgical risks including anesthetic complications, cardiorespiratory complications, bleeding, infection, wound healing complications, blood clots, lymphedema, local and distant recurrence and possible need for further surgery based on the final pathology was discussed and understood. Chemotherapy, hormonal therapy and radiation therapy have been discussed. They have been provided with literature regarding the treatment of breast cancer. she will  require Port-A-Cath placement as well for Herceptin postoperatively. We discussed Port-A-Cath placement including its indications and risks including pneumothorax, catheter displacement or infection and DVT. She is being started currently on anastrozole. Genetic testing will be performed because of her family history but after discussion she  does not want to consider bilateral mastectomy even if genetically positive so we do not need to wait for the results to planned surgery. She understands to stop her biologic she receives for RA, Orencia, which was last given 1 month ago. All questions were answered. They understand and agree to proceed and we will go ahead with scheduling. Current Plans Right total mastectomy with axillary sentinel lymph node biopsy and placement of Port-A-Cath under general anesthesia with overnight hospitalization

## 2017-07-02 ENCOUNTER — Observation Stay (HOSPITAL_COMMUNITY)
Admission: RE | Admit: 2017-07-02 | Discharge: 2017-07-03 | Disposition: A | Discharge status: Home / Self Care | Payer: Medicare Other | Source: Ambulatory Visit | Attending: General Surgery | Admitting: General Surgery

## 2017-07-02 ENCOUNTER — Encounter (HOSPITAL_COMMUNITY)
Admission: RE | Admit: 2017-07-02 | Discharge: 2017-07-02 | Disposition: A | Discharge status: Home / Self Care | Payer: Medicare Other | Source: Ambulatory Visit | Attending: General Surgery | Admitting: General Surgery

## 2017-07-02 ENCOUNTER — Ambulatory Visit (HOSPITAL_COMMUNITY): Payer: Medicare Other

## 2017-07-02 ENCOUNTER — Other Ambulatory Visit: Payer: Self-pay

## 2017-07-02 ENCOUNTER — Observation Stay (HOSPITAL_COMMUNITY): Payer: Medicare Other

## 2017-07-02 ENCOUNTER — Encounter (HOSPITAL_COMMUNITY): Payer: Self-pay | Admitting: *Deleted

## 2017-07-02 ENCOUNTER — Encounter (HOSPITAL_COMMUNITY)
Admission: RE | Disposition: A | Discharge status: Home / Self Care | Payer: Self-pay | Source: Ambulatory Visit | Attending: General Surgery

## 2017-07-02 ENCOUNTER — Ambulatory Visit (HOSPITAL_COMMUNITY): Payer: Medicare Other | Admitting: Emergency Medicine

## 2017-07-02 DIAGNOSIS — E876 Hypokalemia: Secondary | ICD-10-CM | POA: Diagnosis not present

## 2017-07-02 DIAGNOSIS — Z419 Encounter for procedure for purposes other than remedying health state, unspecified: Secondary | ICD-10-CM

## 2017-07-02 DIAGNOSIS — Z888 Allergy status to other drugs, medicaments and biological substances status: Secondary | ICD-10-CM | POA: Insufficient documentation

## 2017-07-02 DIAGNOSIS — G8918 Other acute postprocedural pain: Secondary | ICD-10-CM | POA: Diagnosis not present

## 2017-07-02 DIAGNOSIS — Z79899 Other long term (current) drug therapy: Secondary | ICD-10-CM | POA: Diagnosis not present

## 2017-07-02 DIAGNOSIS — Z886 Allergy status to analgesic agent status: Secondary | ICD-10-CM | POA: Diagnosis not present

## 2017-07-02 DIAGNOSIS — Z452 Encounter for adjustment and management of vascular access device: Secondary | ICD-10-CM | POA: Diagnosis not present

## 2017-07-02 DIAGNOSIS — Z6841 Body Mass Index (BMI) 40.0 and over, adult: Secondary | ICD-10-CM | POA: Diagnosis not present

## 2017-07-02 DIAGNOSIS — C50911 Malignant neoplasm of unspecified site of right female breast: Principal | ICD-10-CM | POA: Diagnosis present

## 2017-07-02 DIAGNOSIS — Z95828 Presence of other vascular implants and grafts: Secondary | ICD-10-CM

## 2017-07-02 DIAGNOSIS — Z170 Estrogen receptor positive status [ER+]: Secondary | ICD-10-CM | POA: Diagnosis not present

## 2017-07-02 DIAGNOSIS — C50211 Malignant neoplasm of upper-inner quadrant of right female breast: Secondary | ICD-10-CM | POA: Diagnosis not present

## 2017-07-02 DIAGNOSIS — Z7982 Long term (current) use of aspirin: Secondary | ICD-10-CM | POA: Diagnosis not present

## 2017-07-02 DIAGNOSIS — K219 Gastro-esophageal reflux disease without esophagitis: Secondary | ICD-10-CM | POA: Insufficient documentation

## 2017-07-02 HISTORY — PX: PORTACATH PLACEMENT: SHX2246

## 2017-07-02 HISTORY — PX: SIMPLE MASTECTOMY WITH AXILLARY SENTINEL NODE BIOPSY: SHX6098

## 2017-07-02 LAB — GLUCOSE, CAPILLARY
GLUCOSE-CAPILLARY: 140 mg/dL — AB (ref 65–99)
GLUCOSE-CAPILLARY: 153 mg/dL — AB (ref 65–99)
GLUCOSE-CAPILLARY: 249 mg/dL — AB (ref 65–99)

## 2017-07-02 SURGERY — SIMPLE MASTECTOMY WITH AXILLARY SENTINEL NODE BIOPSY
Anesthesia: General | Site: Chest | Laterality: Right

## 2017-07-02 MED ORDER — HYDROMORPHONE HCL 1 MG/ML IJ SOLN: 0.2500 mg | INTRAMUSCULAR | Status: DC | PRN

## 2017-07-02 MED ORDER — PROPOFOL 10 MG/ML IV BOLUS
INTRAVENOUS | Status: DC | PRN
  Administered 2017-07-02: 20 mg via INTRAVENOUS
  Administered 2017-07-02: 200 mg via INTRAVENOUS
  Administered 2017-07-02: 50 mg via INTRAVENOUS

## 2017-07-02 MED ORDER — OXYCODONE HCL 5 MG PO TABS
ORAL_TABLET | ORAL | Status: AC
  Administered 2017-07-02: 10 mg via ORAL
  Filled 2017-07-02: qty 2

## 2017-07-02 MED ORDER — ONDANSETRON HCL 4 MG/2ML IJ SOLN
INTRAMUSCULAR | Status: DC | PRN
  Administered 2017-07-02: 4 mg via INTRAVENOUS

## 2017-07-02 MED ORDER — PHENYLEPHRINE HCL 10 MG/ML IJ SOLN
INTRAMUSCULAR | Status: DC | PRN
  Administered 2017-07-02: 50 ug/min via INTRAVENOUS

## 2017-07-02 MED ORDER — AMITRIPTYLINE HCL 10 MG PO TABS
20.0000 mg | ORAL_TABLET | Freq: Every day | ORAL | Status: DC
  Filled 2017-07-02: qty 2

## 2017-07-02 MED ORDER — MIDAZOLAM HCL 2 MG/2ML IJ SOLN
2.0000 mg | Freq: Once | INTRAMUSCULAR | Status: AC
  Administered 2017-07-02: 2 mg via INTRAVENOUS

## 2017-07-02 MED ORDER — SODIUM BICARBONATE 4 % IV SOLN
INTRAVENOUS | Status: AC
  Filled 2017-07-02: qty 5

## 2017-07-02 MED ORDER — ATENOLOL 50 MG PO TABS
50.0000 mg | ORAL_TABLET | Freq: Every day | ORAL | Status: DC
  Administered 2017-07-03: 50 mg via ORAL
  Filled 2017-07-02: qty 1

## 2017-07-02 MED ORDER — ONDANSETRON 4 MG PO TBDP: 4.0000 mg | ORAL_TABLET | Freq: Four times a day (QID) | ORAL | Status: DC | PRN

## 2017-07-02 MED ORDER — CHLORHEXIDINE GLUCONATE CLOTH 2 % EX PADS: 6.0000 | MEDICATED_PAD | Freq: Once | CUTANEOUS | Status: DC

## 2017-07-02 MED ORDER — BUPIVACAINE-EPINEPHRINE (PF) 0.5% -1:200000 IJ SOLN
INTRAMUSCULAR | Status: DC | PRN
  Administered 2017-07-02: 30 mL

## 2017-07-02 MED ORDER — FENTANYL CITRATE (PF) 250 MCG/5ML IJ SOLN
INTRAMUSCULAR | Status: AC
  Filled 2017-07-02: qty 5

## 2017-07-02 MED ORDER — 0.9 % SODIUM CHLORIDE (POUR BTL) OPTIME
TOPICAL | Status: DC | PRN
  Administered 2017-07-02: 1000 mL

## 2017-07-02 MED ORDER — HYDROMORPHONE HCL 1 MG/ML IJ SOLN
INTRAMUSCULAR | Status: AC
  Administered 2017-07-02: 1 mg
  Filled 2017-07-02: qty 1

## 2017-07-02 MED ORDER — MIDAZOLAM HCL 2 MG/2ML IJ SOLN
INTRAMUSCULAR | Status: AC
  Filled 2017-07-02: qty 2

## 2017-07-02 MED ORDER — POLYETHYLENE GLYCOL 3350 17 G PO PACK: 17.0000 g | PACK | Freq: Every day | ORAL | Status: DC | PRN

## 2017-07-02 MED ORDER — PHENYLEPHRINE HCL 10 MG/ML IJ SOLN
INTRAMUSCULAR | Status: AC
  Filled 2017-07-02: qty 2

## 2017-07-02 MED ORDER — HEPARIN SOD (PORK) LOCK FLUSH 100 UNIT/ML IV SOLN
INTRAVENOUS | Status: DC | PRN
Start: 2017-07-02 — End: 2017-07-02
  Administered 2017-07-02: 500 [IU] via INTRAVENOUS

## 2017-07-02 MED ORDER — HEPARIN SOD (PORK) LOCK FLUSH 100 UNIT/ML IV SOLN
INTRAVENOUS | Status: AC
  Filled 2017-07-02: qty 5

## 2017-07-02 MED ORDER — ONDANSETRON HCL 4 MG/2ML IJ SOLN: 4.0000 mg | Freq: Four times a day (QID) | INTRAMUSCULAR | Status: DC | PRN

## 2017-07-02 MED ORDER — PROMETHAZINE HCL 25 MG/ML IJ SOLN: 6.2500 mg | INTRAMUSCULAR | Status: DC | PRN

## 2017-07-02 MED ORDER — FENTANYL CITRATE (PF) 100 MCG/2ML IJ SOLN
INTRAMUSCULAR | Status: AC
Start: 2017-07-02 — End: 2017-07-02
  Filled 2017-07-02: qty 2

## 2017-07-02 MED ORDER — IOPAMIDOL (ISOVUE-300) INJECTION 61%
INTRAVENOUS | Status: AC
  Filled 2017-07-02: qty 50

## 2017-07-02 MED ORDER — EPHEDRINE SULFATE-NACL 50-0.9 MG/10ML-% IV SOSY
PREFILLED_SYRINGE | INTRAVENOUS | Status: DC | PRN
  Administered 2017-07-02 (×2): 10 mg via INTRAVENOUS

## 2017-07-02 MED ORDER — PANTOPRAZOLE SODIUM 40 MG PO TBEC: 40.0000 mg | DELAYED_RELEASE_TABLET | Freq: Every day | ORAL | Status: DC

## 2017-07-02 MED ORDER — POTASSIUM CHLORIDE IN NACL 20-0.9 MEQ/L-% IV SOLN
INTRAVENOUS | Status: DC
  Administered 2017-07-03: 03:00:00 via INTRAVENOUS
  Filled 2017-07-02: qty 1000

## 2017-07-02 MED ORDER — ANASTROZOLE 1 MG PO TABS
1.0000 mg | ORAL_TABLET | Freq: Every day | ORAL | Status: DC
  Filled 2017-07-02: qty 1

## 2017-07-02 MED ORDER — DEXAMETHASONE SODIUM PHOSPHATE 10 MG/ML IJ SOLN
INTRAMUSCULAR | Status: AC
  Filled 2017-07-02: qty 1

## 2017-07-02 MED ORDER — BUPIVACAINE-EPINEPHRINE 0.25% -1:200000 IJ SOLN
INTRAMUSCULAR | Status: DC | PRN
  Administered 2017-07-02: 30 mL

## 2017-07-02 MED ORDER — DEXAMETHASONE SODIUM PHOSPHATE 10 MG/ML IJ SOLN
INTRAMUSCULAR | Status: DC | PRN
  Administered 2017-07-02: 10 mg via INTRAVENOUS

## 2017-07-02 MED ORDER — LIDOCAINE HCL (CARDIAC) 20 MG/ML IV SOLN
INTRAVENOUS | Status: DC | PRN
  Administered 2017-07-02: 100 mg via INTRAVENOUS

## 2017-07-02 MED ORDER — LIDOCAINE HCL (PF) 1 % IJ SOLN
INTRAMUSCULAR | Status: AC
  Filled 2017-07-02: qty 30

## 2017-07-02 MED ORDER — FENTANYL CITRATE (PF) 100 MCG/2ML IJ SOLN
100.0000 ug | Freq: Once | INTRAMUSCULAR | Status: AC
  Administered 2017-07-02: 100 ug via INTRAVENOUS

## 2017-07-02 MED ORDER — DIPHENHYDRAMINE HCL 50 MG/ML IJ SOLN
INTRAMUSCULAR | Status: AC
  Filled 2017-07-02: qty 1

## 2017-07-02 MED ORDER — HEPARIN SODIUM (PORCINE) 5000 UNIT/ML IJ SOLN
INTRAMUSCULAR | Status: DC | PRN
  Administered 2017-07-02: 13:00:00

## 2017-07-02 MED ORDER — CYCLOBENZAPRINE HCL 5 MG PO TABS: 5.0000 mg | ORAL_TABLET | Freq: Every evening | ORAL | Status: DC | PRN

## 2017-07-02 MED ORDER — MIDAZOLAM HCL 2 MG/2ML IJ SOLN
INTRAMUSCULAR | Status: DC | PRN
  Administered 2017-07-02: 1 mg via INTRAVENOUS

## 2017-07-02 MED ORDER — LACTATED RINGERS IV SOLN
INTRAVENOUS | Status: DC | PRN
  Administered 2017-07-02 (×2): via INTRAVENOUS

## 2017-07-02 MED ORDER — OXYCODONE HCL 5 MG PO TABS
5.0000 mg | ORAL_TABLET | ORAL | Status: DC | PRN
  Administered 2017-07-02 – 2017-07-03 (×3): 10 mg via ORAL
  Filled 2017-07-02 (×2): qty 2

## 2017-07-02 MED ORDER — BUPROPION HCL ER (XL) 150 MG PO TB24
150.0000 mg | ORAL_TABLET | Freq: Every day | ORAL | Status: DC
  Administered 2017-07-03: 150 mg via ORAL
  Filled 2017-07-02: qty 1

## 2017-07-02 MED ORDER — SCOPOLAMINE 1 MG/3DAYS TD PT72
1.0000 | MEDICATED_PATCH | TRANSDERMAL | Status: DC
  Administered 2017-07-02: 1.5 mg via TRANSDERMAL
  Filled 2017-07-02: qty 1

## 2017-07-02 MED ORDER — FENTANYL CITRATE (PF) 100 MCG/2ML IJ SOLN
INTRAMUSCULAR | Status: DC | PRN
  Administered 2017-07-02: 100 ug via INTRAVENOUS
  Administered 2017-07-02 (×2): 50 ug via INTRAVENOUS

## 2017-07-02 MED ORDER — ACETAMINOPHEN 500 MG PO TABS: 500.0000 mg | ORAL_TABLET | Freq: Four times a day (QID) | ORAL | Status: DC | PRN

## 2017-07-02 MED ORDER — METHYLENE BLUE 0.5 % INJ SOLN
INTRAVENOUS | Status: AC
  Filled 2017-07-02: qty 10

## 2017-07-02 MED ORDER — PROPOFOL 10 MG/ML IV BOLUS
INTRAVENOUS | Status: AC
  Filled 2017-07-02: qty 20

## 2017-07-02 MED ORDER — PHENYLEPHRINE 40 MCG/ML (10ML) SYRINGE FOR IV PUSH (FOR BLOOD PRESSURE SUPPORT)
PREFILLED_SYRINGE | INTRAVENOUS | Status: DC | PRN
  Administered 2017-07-02 (×2): 120 ug via INTRAVENOUS

## 2017-07-02 MED ORDER — INSULIN ASPART 100 UNIT/ML ~~LOC~~ SOLN: 0.0000 [IU] | Freq: Three times a day (TID) | SUBCUTANEOUS | Status: DC

## 2017-07-02 MED ORDER — MORPHINE SULFATE (PF) 4 MG/ML IV SOLN: 2.0000 mg | INTRAVENOUS | Status: DC | PRN

## 2017-07-02 MED ORDER — ENOXAPARIN SODIUM 40 MG/0.4ML ~~LOC~~ SOLN: 40.0000 mg | SUBCUTANEOUS | Status: DC

## 2017-07-02 MED ORDER — TECHNETIUM TC 99M SULFUR COLLOID FILTERED
1.0000 | Freq: Once | INTRAVENOUS | Status: AC | PRN
  Administered 2017-07-02: 1 via INTRADERMAL

## 2017-07-02 MED ORDER — BUPIVACAINE-EPINEPHRINE (PF) 0.25% -1:200000 IJ SOLN
INTRAMUSCULAR | Status: AC
  Filled 2017-07-02: qty 30

## 2017-07-02 SURGICAL SUPPLY — 85 items
ADH SKN CLS APL DERMABOND .7 (GAUZE/BANDAGES/DRESSINGS) ×4
BAG DECANTER FOR FLEXI CONT (MISCELLANEOUS) ×4 IMPLANT
BINDER BREAST LRG (GAUZE/BANDAGES/DRESSINGS) IMPLANT
BINDER BREAST XLRG (GAUZE/BANDAGES/DRESSINGS) IMPLANT
BINDER BREAST XXLRG (GAUZE/BANDAGES/DRESSINGS) ×2 IMPLANT
CANISTER SUCT 3000ML PPV (MISCELLANEOUS) ×6 IMPLANT
CHLORAPREP W/TINT 10.5 ML (MISCELLANEOUS) ×2 IMPLANT
CHLORAPREP W/TINT 26ML (MISCELLANEOUS) ×4 IMPLANT
CLIP VESOCCLUDE MED 6/CT (CLIP) ×2 IMPLANT
CONT SPEC 4OZ CLIKSEAL STRL BL (MISCELLANEOUS) ×8 IMPLANT
COVER MAYO STAND STRL (DRAPES) ×2 IMPLANT
COVER PROBE W GEL 5X96 (DRAPES) ×4 IMPLANT
COVER SURGICAL LIGHT HANDLE (MISCELLANEOUS) ×4 IMPLANT
COVER TRANSDUCER ULTRASND GEL (DRAPE) ×2 IMPLANT
CRADLE DONUT ADULT HEAD (MISCELLANEOUS) ×4 IMPLANT
DECANTER SPIKE VIAL GLASS SM (MISCELLANEOUS) ×2 IMPLANT
DERMABOND ADVANCED (GAUZE/BANDAGES/DRESSINGS) ×4
DERMABOND ADVANCED .7 DNX12 (GAUZE/BANDAGES/DRESSINGS) ×2 IMPLANT
DEVICE DISSECT PLASMABLAD 3.0S (MISCELLANEOUS) ×2 IMPLANT
DRAIN CHANNEL 19F RND (DRAIN) ×4 IMPLANT
DRAPE C-ARM 42X72 X-RAY (DRAPES) ×4 IMPLANT
DRAPE CHEST BREAST 15X10 FENES (DRAPES) ×4 IMPLANT
DRAPE SURG 17X23 STRL (DRAPES) ×10 IMPLANT
DRAPE UTILITY XL STRL (DRAPES) ×4 IMPLANT
ELECT CAUTERY BLADE 6.4 (BLADE) ×4 IMPLANT
ELECT REM PT RETURN 9FT ADLT (ELECTROSURGICAL) ×4
ELECTRODE REM PT RTRN 9FT ADLT (ELECTROSURGICAL) ×4 IMPLANT
EVACUATOR SILICONE 100CC (DRAIN) ×4 IMPLANT
GAUZE SPONGE 4X4 16PLY XRAY LF (GAUZE/BANDAGES/DRESSINGS) ×4 IMPLANT
GEL ULTRASOUND 20GR AQUASONIC (MISCELLANEOUS) ×2 IMPLANT
GLOVE BIO SURGEON STRL SZ7 (GLOVE) ×2 IMPLANT
GLOVE BIOGEL PI IND STRL 7.0 (GLOVE) IMPLANT
GLOVE BIOGEL PI IND STRL 8 (GLOVE) ×2 IMPLANT
GLOVE BIOGEL PI INDICATOR 7.0 (GLOVE) ×8
GLOVE BIOGEL PI INDICATOR 8 (GLOVE) ×4
GLOVE ECLIPSE 7.5 STRL STRAW (GLOVE) ×4 IMPLANT
GLOVE SURG SS PI 6.5 STRL IVOR (GLOVE) ×2 IMPLANT
GLOVE SURG SS PI 7.0 STRL IVOR (GLOVE) ×2 IMPLANT
GOWN STRL REUS W/ TWL LRG LVL3 (GOWN DISPOSABLE) ×4 IMPLANT
GOWN STRL REUS W/ TWL XL LVL3 (GOWN DISPOSABLE) ×2 IMPLANT
GOWN STRL REUS W/TWL LRG LVL3 (GOWN DISPOSABLE) ×16
GOWN STRL REUS W/TWL XL LVL3 (GOWN DISPOSABLE) ×8
INTRODUCER COOK 11FR (CATHETERS) IMPLANT
IV CATH 14GX2 1/4 (CATHETERS) IMPLANT
KIT BASIN OR (CUSTOM PROCEDURE TRAY) ×4 IMPLANT
KIT PORT POWER 8FR ISP CVUE (Miscellaneous) ×2 IMPLANT
KIT ROOM TURNOVER OR (KITS) ×4 IMPLANT
NDL 18GX1X1/2 (RX/OR ONLY) (NEEDLE) ×2 IMPLANT
NDL FILTER BLUNT 18X1 1/2 (NEEDLE) IMPLANT
NDL HYPO 25GX1X1/2 BEV (NEEDLE) ×2 IMPLANT
NEEDLE 18GX1X1/2 (RX/OR ONLY) (NEEDLE) IMPLANT
NEEDLE 22X1 1/2 (OR ONLY) (NEEDLE) ×2 IMPLANT
NEEDLE FILTER BLUNT 18X 1/2SAF (NEEDLE)
NEEDLE FILTER BLUNT 18X1 1/2 (NEEDLE) IMPLANT
NEEDLE HYPO 25GX1X1/2 BEV (NEEDLE) ×4 IMPLANT
NS IRRIG 1000ML POUR BTL (IV SOLUTION) ×4 IMPLANT
PACK GENERAL/GYN (CUSTOM PROCEDURE TRAY) ×4 IMPLANT
PACK SURGICAL SETUP 50X90 (CUSTOM PROCEDURE TRAY) ×2 IMPLANT
PAD ABD 8X10 STRL (GAUZE/BANDAGES/DRESSINGS) ×2 IMPLANT
PAD ARMBOARD 7.5X6 YLW CONV (MISCELLANEOUS) ×4 IMPLANT
PENCIL BUTTON HOLSTER BLD 10FT (ELECTRODE) ×2 IMPLANT
PLASMABLADE 3.0S (MISCELLANEOUS)
SET INTRODUCER 12FR PACEMAKER (SHEATH) IMPLANT
SET SHEATH INTRODUCER 10FR (MISCELLANEOUS) IMPLANT
SHEATH COOK PEEL AWAY SET 9F (SHEATH) IMPLANT
SPECIMEN JAR X LARGE (MISCELLANEOUS) ×4 IMPLANT
STAPLER VISISTAT 35W (STAPLE) ×2 IMPLANT
SUT ETHILON 2 0 FS 18 (SUTURE) ×4 IMPLANT
SUT MNCRL AB 4-0 PS2 18 (SUTURE) ×4 IMPLANT
SUT MON AB 5-0 PS2 18 (SUTURE) ×4 IMPLANT
SUT PROLENE 2 0 SH DA (SUTURE) ×4 IMPLANT
SUT SILK 2 0 (SUTURE)
SUT SILK 2-0 18XBRD TIE 12 (SUTURE) IMPLANT
SUT VIC AB 3-0 54X BRD REEL (SUTURE) IMPLANT
SUT VIC AB 3-0 BRD 54 (SUTURE) ×4
SUT VIC AB 3-0 SH 18 (SUTURE) ×8 IMPLANT
SYR 20ML ECCENTRIC (SYRINGE) ×8 IMPLANT
SYR 5ML LUER SLIP (SYRINGE) ×4 IMPLANT
SYR BULB 3OZ (MISCELLANEOUS) ×4 IMPLANT
SYR CONTROL 10ML LL (SYRINGE) ×4 IMPLANT
TOWEL OR 17X24 6PK STRL BLUE (TOWEL DISPOSABLE) ×4 IMPLANT
TOWEL OR 17X26 10 PK STRL BLUE (TOWEL DISPOSABLE) ×4 IMPLANT
TUBE CONNECTING 12'X1/4 (SUCTIONS)
TUBE CONNECTING 12X1/4 (SUCTIONS) ×2 IMPLANT
YANKAUER SUCT BULB TIP NO VENT (SUCTIONS) ×2 IMPLANT

## 2017-07-02 NOTE — Op Note (Signed)
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 After extensive preoperative workup and discussion detailed elsewhere we have elected to proceed with right total mastectomy and axillary sentinel lymph node biopsy as well as placement of a Port-A-Cath.    Procedure Detail:  Patient in the holding area underwent injection of 1 mCi of technetium sulfur colloid intradermally around the right nipple. She was brought to the operating room, placed in the supine position on the operating table and laryngeal mask general anesthesia induced.  She was carefully positioned with her right arm extended and left arm tucked. She received preoperative IV antibiotics and PAS were in place. The entire anterior chest and neck and upper arms were widely sterilely prepped and draped. Patient timeout was performed and correct procedure was verified. The right mastectomy was approached initially. I made a transversely oriented elliptical incision encompassing the central breast skin and nipple areolar complex. Skin and subcutaneous flaps were then raised superiorly toward the clavicle medially toward the sternum and inferiorly to the inframammary crease and laterally out toward the latissimus. The dissection was deepened down to the chest wall in all directions and down to the anterior border of latissimus which was defined. The breast was then reflected up off the chest wall working medial to lateral. It was detached from its attachments to the pectoralis and its lateral border as well as the serratus. It was  dissected up off of the anterior border of latissimus and the dissection continued up to the axillary tail. The pectoralis minor was identified and the clavipectoral fascia incised. The neoprobe was then used to localize the sentinel lymph nodes. The initial lymph node had very high counts of about 4000. 2 additional lymph nodes with counts of about 1-200 each were also excised. There was no gross adenopathy. At this point background in the axilla was only about 15 or 20. I then came across the low axilla with Kelly clamps and the specimen was removed and oriented with a suture. The axillary tissue was ligated with 3-0 Vicryl. The wound was thoroughly irrigated and complete hemostasis obtained. A 19 Blake closed suction drain was left through a separate stab wound and placed under both flaps. The subcutaneous tissue was closed with interrupted 3-0 Vicryl and the skin with running subcuticular 4-0 Monocryl and Dermabond. Attention was turned to the Port-A-Cath.  Ultrasound was used to localize the left internal jugular vein and confirm its compressibility. Tissue overlying the vein was infiltrated with local anesthesia. A small incision was made and under direct ultrasound guidance the left internal jugular vein was cannulated with a needle and guidewire. Guidewire position was confirmed with fluoroscopy. The introducer was passed over the guidewire and the guidewire removed. The flushed catheter was placed via the introducer which was stripped away and the tip of the catheter positioned in the superior vena cava confirmed by fluoroscopy. A site on the anterior chest wall was chosen for the port, was anesthetized and a small transverse incision made and subcutaneous pocket created. The catheter was tunneled subcutaneously to the pocket, trimmed to length and attached to the port which was sutured to the anterior chest wall  with interrupted 2-0 Prolene. The position of the catheter was again confirmed by fluoroscopy  in the superior vena cava/atrial junction. The incisions were closed with subcutaneous interrupted Monocryl and subcuticular Monocryl and Dermabond. The catheter aspirated and flushed easily and was left flushed with concentrated heparin solution. Sponge needle and instrument counts were correct. Patient was taken to recovery room in good condition.    Findings: As above  Estimated Blood Loss:  less than 100 mL         Drains: 19 round Blake drain under mastectomy skin flaps  Blood Given: none          Specimens: #1 right total mastectomy    #2 right axillary sentinel lymph nodes 3        Complications:  * No complications entered in OR log *         Disposition: PACU - hemodynamically stable.         Condition: stable

## 2017-07-02 NOTE — Interval H&P Note (Signed)
History and Physical Interval Note:  07/02/2017 11:57 AM  Kiara Brown  has presented today for surgery, with the diagnosis of Cancer right breast  The various methods of treatment have been discussed with the patient and family. After consideration of risks, benefits and other options for treatment, the patient has consented to  Procedure(s): RIGHT TOTAL  MASTECTOMY WITH AXILLARY SENTINEL LYMPH  NODE BIOPSY (Right) INSERTION PORT-A-CATH (N/A) as a surgical intervention .  The patient's history has been reviewed, patient examined, no change in status, stable for surgery.  I have reviewed the patient's chart and labs.  Questions were answered to the patient's satisfaction.     [name]

## 2017-07-02 NOTE — Anesthesia Procedure Notes (Signed)
Procedure Name: LMA Insertion Date/Time: 07/02/2017 12:55 PM Performed by: Rejeana Brock L Pre-anesthesia Checklist: Patient identified, Emergency Drugs available, Suction available and Patient being monitored Patient Re-evaluated:Patient Re-evaluated prior to induction Oxygen Delivery Method: Circle System Utilized Preoxygenation: Pre-oxygenation with 100% oxygen Induction Type: IV induction Ventilation: Mask ventilation without difficulty LMA: LMA inserted LMA Size: 4.0 Number of attempts: 1 Airway Equipment and Method: Bite block Placement Confirmation: positive ETCO2 Tube secured with: Tape Dental Injury: Teeth and Oropharynx as per pre-operative assessment

## 2017-07-02 NOTE — Transfer of Care (Signed)
Immediate Anesthesia Transfer of Care Note  Patient: Kiara Brown  Procedure(s) Performed: Procedure(s): RIGHT TOTAL  MASTECTOMY WITH AXILLARY SENTINEL LYMPH  NODE BIOPSY (Right) INSERTION PORT-A-CATH (Left)  Patient Location: PACU  Anesthesia Type:GA combined with regional for post-op pain  Level of Consciousness: awake  Airway & Oxygen Therapy: Patient Spontanous Breathing and Patient connected to nasal cannula oxygen  Post-op Assessment: Report given to RN, Post -op Vital signs reviewed and stable and Patient moving all extremities X 4  Post vital signs: Reviewed and stable  Last Vitals:  Vitals:   07/02/17 1200 07/02/17 1530  BP: (!) 146/50 (P) 99/61  Pulse: 80   Resp: 17   Temp:  (!) (P) 36.4 C    Last Pain:  Vitals:   07/02/17 1027  TempSrc:   PainSc: 6          Complications: No apparent anesthesia complications

## 2017-07-02 NOTE — Anesthesia Preprocedure Evaluation (Addendum)
Anesthesia Evaluation  Patient identified by MRN, date of birth, ID band Patient awake    Reviewed: Allergy & Precautions, NPO status , Patient's Chart, lab work & pertinent test results  History of Anesthesia Complications (+) PONV and history of anesthetic complications  Airway Mallampati: II  TM Distance: >3 FB Neck ROM: Full    Dental no notable dental hx. (+) Dental Advisory Given   Pulmonary neg pulmonary ROS,    Pulmonary exam normal        Cardiovascular negative cardio ROS Normal cardiovascular exam  ------------------------------------------------------------------- Study Conclusions  - Left ventricle: The cavity size was normal. Wall thickness was   increased in a pattern of mild LVH. Systolic function was normal.   The estimated ejection fraction was in the range of 60% to 65%.   Wall motion was normal; there were no regional wall motion   abnormalities.    Neuro/Psych negative neurological ROS  negative psych ROS   GI/Hepatic Neg liver ROS, hiatal hernia, GERD  ,  Endo/Other  Morbid obesity  Renal/GU negative Renal ROS     Musculoskeletal   Abdominal   Peds  Hematology negative hematology ROS (+)   Anesthesia Other Findings   Reproductive/Obstetrics                            Anesthesia Physical Anesthesia Plan  ASA: III  Anesthesia Plan: General   Post-op Pain Management:    Induction: Intravenous  PONV Risk Score and Plan: 4 or greater and Ondansetron, Dexamethasone, Scopolamine patch - Pre-op and Diphenhydramine  Airway Management Planned: LMA and Oral ETT  Additional Equipment:   Intra-op Plan:   Post-operative Plan: Extubation in OR  Informed Consent: I have reviewed the patients History and Physical, chart, labs and discussed the procedure including the risks, benefits and alternatives for the proposed anesthesia with the patient or authorized  representative who has indicated his/her understanding and acceptance.   Dental advisory given  Plan Discussed with: CRNA, Anesthesiologist and Surgeon  Anesthesia Plan Comments:        Anesthesia Quick Evaluation

## 2017-07-02 NOTE — Progress Notes (Signed)
Inpatient Diabetes Program Recommendations  AACE/ADA: New Consensus Statement on Inpatient Glycemic Control (2015)  Target Ranges:  Prepandial:   less than 140 mg/dL      Peak postprandial:   less than 180 mg/dL (1-2 hours)      Critically ill patients:  140 - 180 mg/dL   Lab Results  Component Value Date   GLUCAP 140 (H) 07/02/2017    Review of Glycemic ControlResults for JESLYN, AMSLER (MRN [id number]) as of 07/02/2017 15:01  Ref. Range 06/27/2017 08:58  Glucose Latest Ref Range: 65 - 99 mg/dL 248 (H)   Diabetes history: No history of DM Outpatient Diabetes medications: None Inpatient Diabetes Program Recommendations:   Referral received.  Note that lab glucose elevated at pre-op appointment.  Please order A1C to determine pre-hospitalization glycemic control. Also may consider ordering CBG's tid with meals and HS with sensitive correction.   Will follow.  Thanks, Adah Perl, RN, BC-ADM Inpatient Diabetes Coordinator Pager [phone] (8a-5p)

## 2017-07-02 NOTE — Anesthesia Procedure Notes (Signed)
Anesthesia Regional Block: Pectoralis block   Pre-Anesthetic Checklist: ,, timeout performed, Correct Patient, Correct Site, Correct Laterality, Correct Procedure, Correct Position, site marked, Risks and benefits discussed,  Surgical consent,  Pre-op evaluation,  At surgeon's request and post-op pain management  Laterality: Right  Prep: chloraprep       Needles:  Injection technique: Single-shot  Needle Type: Echogenic Stimulator Needle     Needle Length: 10cm  Needle Gauge: 21     Additional Needles:   Procedures: ultrasound guided,,,,,,,,  Narrative:  Start time: 07/02/2017 11:11 AM End time: 07/02/2017 11:21 AM Injection made incrementally with aspirations every 5 mL.  Performed by: Personally

## 2017-07-02 NOTE — Anesthesia Postprocedure Evaluation (Signed)
Anesthesia Post Note  Patient: Kiara Brown  Procedure(s) Performed: Procedure(s) (LRB): RIGHT TOTAL  MASTECTOMY WITH AXILLARY SENTINEL LYMPH  NODE BIOPSY (Right) INSERTION PORT-A-CATH (Left)     Patient location during evaluation: PACU Anesthesia Type: General Level of consciousness: sedated Pain management: pain level controlled Vital Signs Assessment: post-procedure vital signs reviewed and stable Respiratory status: spontaneous breathing and respiratory function stable Cardiovascular status: stable Anesthetic complications: no    Last Vitals:  Vitals:   07/02/17 1200 07/02/17 1530  BP: (!) 146/50 99/61  Pulse: 80 77  Resp: 17 16  Temp:  (!) 36.4 C    Last Pain:  Vitals:   07/02/17 1027  TempSrc:   PainSc: 6                  Kianna Billet DANIEL

## 2017-07-03 ENCOUNTER — Encounter (HOSPITAL_COMMUNITY): Payer: Self-pay | Admitting: General Surgery

## 2017-07-03 DIAGNOSIS — Z888 Allergy status to other drugs, medicaments and biological substances status: Secondary | ICD-10-CM | POA: Diagnosis not present

## 2017-07-03 DIAGNOSIS — K219 Gastro-esophageal reflux disease without esophagitis: Secondary | ICD-10-CM | POA: Diagnosis not present

## 2017-07-03 DIAGNOSIS — Z7982 Long term (current) use of aspirin: Secondary | ICD-10-CM | POA: Diagnosis not present

## 2017-07-03 DIAGNOSIS — C50911 Malignant neoplasm of unspecified site of right female breast: Secondary | ICD-10-CM | POA: Diagnosis not present

## 2017-07-03 DIAGNOSIS — Z886 Allergy status to analgesic agent status: Secondary | ICD-10-CM | POA: Diagnosis not present

## 2017-07-03 LAB — CBC
HCT: 29.4 % — ABNORMAL LOW (ref 36.0–46.0)
Hemoglobin: 9.2 g/dL — ABNORMAL LOW (ref 12.0–15.0)
MCH: 26.2 pg (ref 26.0–34.0)
MCHC: 31.3 g/dL (ref 30.0–36.0)
MCV: 83.8 fL (ref 78.0–100.0)
PLATELETS: 279 10*3/uL (ref 150–400)
RBC: 3.51 MIL/uL — ABNORMAL LOW (ref 3.87–5.11)
RDW: 15.7 % — AB (ref 11.5–15.5)
WBC: 9.3 10*3/uL (ref 4.0–10.5)

## 2017-07-03 LAB — BASIC METABOLIC PANEL
Anion gap: 10 (ref 5–15)
BUN: 15 mg/dL (ref 6–20)
CALCIUM: 8.3 mg/dL — AB (ref 8.9–10.3)
CHLORIDE: 102 mmol/L (ref 101–111)
CO2: 22 mmol/L (ref 22–32)
CREATININE: 1.03 mg/dL — AB (ref 0.44–1.00)
GFR calc non Af Amer: 51 mL/min — ABNORMAL LOW (ref >60)
GFR, EST AFRICAN AMERICAN: 60 mL/min — AB (ref >60)
Glucose, Bld: 210 mg/dL — ABNORMAL HIGH (ref 65–99)
Potassium: 4.7 mmol/L (ref 3.5–5.1)
SODIUM: 134 mmol/L — AB (ref 135–145)

## 2017-07-03 LAB — GLUCOSE, CAPILLARY: GLUCOSE-CAPILLARY: 186 mg/dL — AB (ref 65–99)

## 2017-07-03 LAB — HEMOGLOBIN A1C
HEMOGLOBIN A1C: 8 %
MEAN PLASMA GLUCOSE: 182.9 mg/dL

## 2017-07-03 MED ORDER — OXYCODONE HCL 5 MG PO TABS: 5.0000 mg | ORAL_TABLET | ORAL | 0 refills | Status: DC | PRN

## 2017-07-03 NOTE — Discharge Instructions (Signed)
[facility] °[phone] ° °MASTECTOMY: POST OP INSTRUCTIONS ° °Always review your discharge instruction sheet given to you by the facility where your surgery was performed. °IF YOU HAVE DISABILITY OR FAMILY LEAVE FORMS, YOU MUST BRING THEM TO THE OFFICE FOR PROCESSING.   °DO NOT GIVE THEM TO YOUR DOCTOR. °A prescription for pain medication may be given to you upon discharge.  Take your pain medication as prescribed, if needed.  If narcotic pain medicine is not needed, then you may take acetaminophen (Tylenol) or ibuprofen (Advil) as needed. °1. Take your usually prescribed medications unless otherwise directed. °2. If you need a refill on your pain medication, please contact your pharmacy.  They will contact our office to request authorization.  Prescriptions will not be filled after 5pm or on week-ends. °3. You should follow a light diet the first few days after arrival home, such as soup and crackers, etc.  Resume your normal diet the day after surgery. °4. Most patients will experience some swelling and bruising on the chest and underarm.  Ice packs will help.  Swelling and bruising can take several days to resolve.  °5. It is common to experience some constipation if taking pain medication after surgery.  Increasing fluid intake and taking a stool softener (such as Colace) will usually help or prevent this problem from occurring.  A mild laxative (Milk of Magnesia or Miralax) should be taken according to package instructions if there are no bowel movements after 48 hours. °6. Unless discharge instructions indicate otherwise, leave your bandage dry and in place until your next appointment in 3-5 days.  You may take a limited sponge bath.  No tube baths or showers until the drains are removed.  You may have steri-strips (small skin tapes) in place directly over the incision.  These strips should be left on the skin for 7-10 days.  If your surgeon used skin glue on the incision, you may  shower in 24 hours.  The glue will flake off over the next 2-3 weeks.  Any sutures or staples will be removed at the office during your follow-up visit. °7. DRAINS:  If you have drains in place, it is important to keep a list of the amount of drainage produced each day in your drains.  Before leaving the hospital, you should be instructed on drain care.  Call our office if you have any questions about your drains. °8. ACTIVITIES:  You may resume regular (light) daily activities beginning the next day--such as daily self-care, walking, climbing stairs--gradually increasing activities as tolerated.  You may have sexual intercourse when it is comfortable.  Refrain from any heavy lifting or straining until approved by your doctor. °a. You may drive when you are no longer taking prescription pain medication, you can comfortably wear a seatbelt, and you can safely maneuver your car and apply brakes. °b. RETURN TO WORK:  __________________________________________________________ °9. You should see your doctor in the office for a follow-up appointment approximately 3-5 days after your surgery.  Your doctor’s nurse will typically make your follow-up appointment when she calls you with your pathology report.  Expect your pathology report 2-3 business days after your surgery.  You may call to check if you do not hear from us after three days.   °10. OTHER INSTRUCTIONS: ______________________________________________________________________________________________ ____________________________________________________________________________________________ °WHEN TO CALL YOUR DOCTOR: °1. Fever over 101.0 °2. Nausea and/or vomiting °3. Extreme swelling or bruising °4. Continued bleeding from incision. °5. Increased pain, redness, or drainage from the incision. °  The clinic staff is available to answer your questions during regular business hours.  Please don’t hesitate to call and ask to speak to one of the nurses for clinical  concerns.  If you have a medical emergency, go to the nearest emergency room or call 911.  A surgeon from [facility] is always on call at the hospital. °[address], [address], [place], [place]  [address] ? [address] [address], [place], [place]   [address] °[phone] ? [phone] ? FAX [phone] °Web site: [email or web address] °

## 2017-07-03 NOTE — Discharge Summary (Signed)
Patient ID: Kiara Brown [id number] 76 y.o. 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 After extensive preoperative workup and discussion detailed elsewhere she is electively admitted for right total mastectomy and sentinel lymph node biopsy and placement of Port-A-Cath for subsequent Herceptin  Hospital Course: On the day of admission the patient underwent an uneventful right total mastectomy with axillary sentinel lymph node biopsy and placement of Port-A-Cath. She tolerated the procedure well without complications. Chest x-ray showed Port-A-Cath in good position. The following morning she is very comfortable without pain. Incisions are clean and dry without complication. She is felt ready for discharge. Of note her preoperative blood sugar was in the range of 250. She was treated with sliding scale insulin while in the hospital and did have one CBG as high as 248. This was discussed with the patient and she will arrange short-term follow-up with her primary physician for evaluation and treatment.   Disposition: Home  Patient Instructions:  Allergies as of 07/03/2017      Reactions   Duloxetine    UNSPECIFIED REACTION    Nsaids Rash   Oseltamivir Diarrhea, Other (See Comments)   Abdominal pain      Medication List    TAKE these medications   acetaminophen 500 MG tablet Commonly known as:  TYLENOL Take 500 mg by mouth  every 6 (six) hours as needed (Pain). Pain   amitriptyline 10 MG tablet Commonly known as:  ELAVIL Take 20 mg by mouth at bedtime.   anastrozole 1 MG tablet Commonly known as:  ARIMIDEX Take 1 tablet (1 mg total) by mouth daily.   aspirin EC 81 MG tablet Take 81 mg by mouth daily.   atenolol 50 MG tablet Commonly known as:  TENORMIN Take 50 mg by mouth daily before breakfast.   buPROPion 150 MG 24 hr tablet Commonly known as:  WELLBUTRIN XL Take 150 mg by mouth daily before breakfast.   cyclobenzaprine 5 MG tablet Commonly known as:  FLEXERIL Take 5 mg by mouth at bedtime as needed for muscle spasms.   docusate sodium 100 MG capsule Commonly known as:  COLACE Take 1 capsule (100 mg total) by mouth 2 (two) times daily. What changed:  when to take this   folic acid 1 MG tablet Commonly known as:  FOLVITE Take 1 mg by mouth daily.   omeprazole 40 MG capsule Commonly known as:  PRILOSEC Take 40 mg by mouth every morning.   ondansetron 4 MG tablet Commonly known as:  ZOFRAN Take 1 tablet (4 mg total) by mouth every 6 (six) hours as needed for nausea.   ORENCIA IV Inject 1,000 mg into the vein every 30 (thirty) days.   oxyCODONE 5 MG immediate release tablet Commonly known as:  Oxy IR/ROXICODONE Take 1 tablet (5 mg total) by mouth every 4 (four) hours as needed for moderate pain.   polyethylene glycol packet Commonly known as:  MIRALAX / GLYCOLAX Take 17 g by mouth daily as needed for mild constipation.   traMADol 50  MG tablet Commonly known as:  ULTRAM Take 50 mg by mouth daily as needed for pain.       Activity: activity as tolerated Diet: Low carbohydrate Wound Care: Empty JP drain twice daily and record amount  Follow-up:  With Dr. Excell Seltzer in 1 week and primary physician as soon as possible.  Signed: Edward Jolly MD, FACS  07/03/2017, 7:42 AM

## 2017-07-03 NOTE — Progress Notes (Signed)
Discharge instructions reviewed with patient and family. Questions answered re: skin care and pain med administration. Patient able to answer teach-back questions re: f/u appointment and incision care. Printed AVS, prescription, and JP education sheet given to patient. Patient able to do return demonstration on emptying JPdrain. Patient discharged to home via wheelchair accompanied by sister.

## 2017-07-03 NOTE — Progress Notes (Signed)
Patient ID: Kiara Brown, female   DOB: Apr 06, 1941, 76 y.o.   MRN: [id number] 1 Day Post-Op   Subjective: Patient is without complaints this morning. Denies pain.  Objective: Vital signs in last 24 hours: Temp:  [97.5 F (36.4 C)-98.2 F (36.8 C)] 97.9 F (36.6 C) (08/08 0440) Pulse Rate:  [60-89] 79 (08/08 0440) Resp:  [10-24] 19 (08/08 0440) BP: (99-168)/(43-76) 104/43 (08/08 0440) SpO2:  [93 %-100 %] 96 % (08/08 0440) Weight:  [119.7 kg (264 lb)] 119.7 kg (264 lb) (08/07 0935) Last BM Date: 07/02/17  Intake/Output from previous day: 08/07 0701 - 08/08 0700 In: 1700 [I.V.:1700] Out: 735 [Urine:600; Drains:85; Blood:50] Intake/Output this shift: No intake/output data recorded.  General appearance: alert, cooperative and no distress Incision/Wound: Incisions all clean and dry without swelling or hematoma. JP drainage is serosanguineous.  Lab Results:   Recent Labs  07/03/17 0524  WBC 9.3  HGB 9.2*  HCT 29.4*  PLT 279   BMET  Recent Labs  07/03/17 0524  NA 134*  K 4.7  CL 102  CO2 22  GLUCOSE 210*  BUN 15  CREATININE 1.03*  CALCIUM 8.3*   CBG (last 3)   Recent Labs  07/02/17 1011 07/02/17 1623 07/02/17 2126  GLUCAP 140* 153* 249*      Studies/Results: Dg Chest Port 1 View  Result Date: 07/02/2017 CLINICAL DATA:  Port-A-Cath in place EXAM: PORTABLE CHEST 1 VIEW COMPARISON:  Chest x-ray of 07/07/2015 FINDINGS: A Port-A-Cath has been inserted from the left with the tip overlying the mid lower SVC. No pneumothorax is seen. The lungs remain clear. Mediastinal and hilar contours are unremarkable, and heart size is stable. No bony abnormality is seen. IMPRESSION: Left-sided Port-A-Cath tip overlies the mid lower SVC. No pneumothorax. Electronically Signed   By: Ivar Drape M.D.   On: 07/02/2017 16:24   Dg Fluoro Guide Cv Line-no Report  Result Date: 07/02/2017 Fluoroscopy was utilized by the requesting physician.  No radiographic interpretation.     Anti-infectives: Anti-infectives    Start     Dose/Rate Route Frequency Ordered Stop   07/02/17 1100  ceFAZolin (ANCEF) 3 g in dextrose 5 % 50 mL IVPB     3 g 130 mL/hr over 30 Minutes Intravenous To [facility] 07/01/17 1443 07/02/17 1311      Assessment/Plan: s/p Procedure(s): RIGHT TOTAL  MASTECTOMY WITH AXILLARY SENTINEL LYMPH  NODE BIOPSY INSERTION PORT-A-CATH Doing well postoperatively without complication. Okay for discharge.  Hyperglycemia. Patient states her primary has been following her blood sugar which has not been running this high. She will contact him today at discharge for an urgent follow-up visit for evaluation and treatment of diabetes.   LOS: 0 days    Liliya Fullenwider T 07/03/2017

## 2017-07-10 ENCOUNTER — Encounter: Payer: Self-pay | Admitting: Genetics

## 2017-07-10 ENCOUNTER — Telehealth: Payer: Self-pay | Admitting: Genetics

## 2017-07-10 DIAGNOSIS — Z1379 Encounter for other screening for genetic and chromosomal anomalies: Secondary | ICD-10-CM | POA: Insufficient documentation

## 2017-07-10 NOTE — Telephone Encounter (Signed)
Revealed negative genetic testing.  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  Discussed that we do not know why she has breast cancer or why there is cancer in the family. It could be due to a different gene that we are not testing, or maybe our current technology may not be able to pick something up.  It will be important for her to keep in contact with genetics to keep up with whether additional testing may be needed.  She asked if she could get additional testing done to look at more genes.  She elected to have reflex testing for the 83 gene panel done.  I will call her when those results are ready.

## 2017-07-16 ENCOUNTER — Encounter: Payer: Self-pay | Admitting: Genetics

## 2017-07-16 ENCOUNTER — Ambulatory Visit: Payer: Self-pay | Admitting: Genetics

## 2017-07-16 ENCOUNTER — Telehealth: Payer: Self-pay | Admitting: Genetics

## 2017-07-16 DIAGNOSIS — Z1379 Encounter for other screening for genetic and chromosomal anomalies: Secondary | ICD-10-CM

## 2017-07-16 DIAGNOSIS — C50211 Malignant neoplasm of upper-inner quadrant of right female breast: Secondary | ICD-10-CM

## 2017-07-16 DIAGNOSIS — Z803 Family history of malignant neoplasm of breast: Secondary | ICD-10-CM

## 2017-07-16 DIAGNOSIS — Z171 Estrogen receptor negative status [ER-]: Principal | ICD-10-CM

## 2017-07-16 DIAGNOSIS — Z8042 Family history of malignant neoplasm of prostate: Secondary | ICD-10-CM

## 2017-07-16 NOTE — Telephone Encounter (Signed)
Revealed negative genetic testing.  Revealed that on other VUS's was found (in addition to the 2 previously disclosed). Discussed that we do not know why she has breast cancer or why there is cancer in the family. It could be due to a different gene that we are not testing, or maybe our current technology may not be able to pick something up.  It will be important for her to keep in contact with genetics to keep up with whether additional testing may be needed.

## 2017-07-16 NOTE — Progress Notes (Signed)
HPI: Kiara Brown was previously seen in the [facility] on 06/25/2017 due to a personal and family history of breast and prostate cancer and concerns regarding a hereditary predisposition to cancer. Please refer to our prior cancer genetics clinic note for more information regarding Kiara Brown's medical, social and family histories, and our assessment and recommendations, at the time. Kiara Brown recent genetic test results were disclosed to her, as well as recommendations warranted by these results. These results and recommendations are discussed in more detail below.  CANCER HISTORY:    Malignant neoplasm of upper-inner quadrant of right breast in female, estrogen receptor positive ([name])   05/31/2017 Initial Diagnosis    Malignant neoplasm of upper-inner quadrant of right breast in female, estrogen receptor positive ([name])     07/14/2017 Genetic Testing    Patient had genetic testing due to a personal and family history of breast and other types of cancer.  She had the Multi-Cancer panel + Breast Cancer Panel.  This testing analyzed the following 90 genes:  ALK, APC,AKT1, ATM, AXIN2,BAP1,  BARD1, BLM, BMPR1A, BRCA1, BRCA2, BRIP1, CASR, CDC73, CDH1, CDK4, CDKN1B, CDKN1C, CDKN2A (p14ARF), CDKN2A (p16INK4a), CEBPA, CHEK2, CTNNA1, DICER1, DIS3L2, EGFR (c.2369C>T, p.Thr790Met variant only), EPCAM (Deletion/duplication testing only), FH, FAM175A, FLCN, GATA2, GPC3, GREM1 (Promoter region deletion/duplication testing only), HOXB13 (c.251G>A, p.Gly84Glu), HRAS, KIT, MAX, MEN1, MET, MITF (c.952G>A, p.Glu318Lys variant only), MLH1, MSH2, MSH3, MSH6, MUTYH, NBN, NF1, NF2, NTHL1, PALB2, PDGFRA, PHOX2B, PMS2, POLD1, POLE, POT1, PRKAR1A, PTCH1, PTEN, RAD50, RAD51C, RAD51D, RB1, RECQL4, RET, RUNX1, SDHAF2, SDHA (sequence changes only), SDHB, SDHC, SDHD, SMAD4, SMARCA4, SMARCB1, SMARCE1, STK11, SUFU, TERC, TERT, TMEM127, TP53, TSC1, TSC2, VHL, WRN, WT1, FANCC, MRE11, PIK3CA, RINT1, XRCC2.  Results:  No pathogenic mutations identified.   3 Variants of Uncertain significance (VUS) were identified.  A Variant of uncertain significance (VUS) in BRCA1 c.2551G>A (p.Glu851Lys).  One VUS was found in CHEK2 c.679G>A (p.Gly227Arg).  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  Another VUS was identified in [name] c.704T>C (p.Ile235Thr).  The date of this test report is 07/14/2017.         FAMILY HISTORY:  We obtained a detailed, 4-generation family history.  Significant diagnoses are listed below: Family History  Problem Relation Age of Onset  . Hypertension Mother   . Stroke Mother   . Breast cancer Mother 60       died at 74  . Hypertension Father   . Heart attack Father        died at 24  . Hypertension Brother        MI  . Prostate cancer Brother 50  . Hypertension Brother        MI  . Lung cancer Brother        possible lung cancer, spot found on lung being monitored  . Hypertension Brother        MI  . Non-Hodgkin's lymphoma Sister 27  . Breast cancer Other 65       is currently 90  . Breast cancer Other 55  . Cancer Cousin 62       type of cancer unk.   . Cancer Other        type unknown, eye/face   . Cancer Other 3       died from cancer in his 35's, type unknown  Kiara Brown had 3 sons.  She had a set of female  twins.  One died at birth, and the other son  is 5 with no history of cancer.  This son has an 29 year-old daughter.  Kiara Brown has a 3 year-old son with no children or any history of cancer.   Kiara Brown has a maternal half-sister, 1 full sisters, and 5 full brothers.  They are listed below: -maternal half sister is 38.  She had breast cancer at 64.  This sister has 4 children: 1 daughter was diagnosed with breast cancer in her 42's and is now in her 2's.  (She has 3 daughters), 1 daughter is 42 and has had an eye/face cancer.  The specific type is unknown (she has 2 children), 1 son has no history of cancer and is in his 63's.  1 son died in his 70's  from cancer, but the type is unknown.  (He had 5 children) -1 full sister had non-hodgkins lymphoma at 33 and is now 30.  She has a 39 year-old son with no history of cancer.  -1 full sister is 54 with no history of cancer.  She has 2 sons and a daughter in their 81's with no history of cancer.  -1 full brother died at 49 from heart disease.  He had 3 children with no history of cancer.  -1 full brother was diagnosed with prostate cancer at 49.  He is now 23 and has a son (67) and daughter (80) with no history of cancer.  -1 full brother died in his 57's/80's from heart disease.  He had 4 children with no history of cancer.  -1 full brother died in his 54's/80's due to heart disease and had 3 children who have no history of cancer.  -1 full brother is 5.  He has heart disease and recently was found to have a spot on his lung- they are unsure at this time if it is lung cancer or not.  He has a daughter and a son in their 59's with no history of cancer.   Kiara Brown father died at 61 due to a heart attack. Kiara Brown has 4 maternal aunts.  One paternal aunt has a daughter (patient's cousin) who died at 17 due to cancer.  The type of cancer is unknown.  This cousin has 3 children with no history of cancer. There is no other known history of cancer for there other paternal aunts.   No known history of cancer in any other paternal cousins.  Kiara Brown paternal grandfather died in his late 61's. Kiara Brown paternal grandmother died in her 102's with kidney cancer.  The patient is unsure if there is any history other of cancer on this side of the family.   Kiara Brown mother had breast cancer at 70 and died of heart disease at 42.  Kiara Brown has 5 maternal uncles with no history of cancer and 3 maternal aunts with no history of cancer.  Kiara Brown has many maternal cousins, but she does not know much information about these relatives.  Kiara Brown does not know much information about her maternal  grandparents.  She believes they died at older ages, and is not aware of any history of cancer.   Kiara Brown is unaware of previous family history of genetic testing for hereditary cancer risks. Patient's maternal ancestors are of Northern European descent, and paternal ancestors are of English/Northern European descent. There is no reported Ashkenazi Jewish ancestry. There is no known consanguinity.  GENETIC TEST RESULTS: Genetic testing performed through [facility]'s Multi-Cancer Panel+ Breast Cancer Panel reported out  on 07/14/2017 showed no pathogenic mutations. The following 90 genes were analyzed: ALK, APC,AKT1, ATM, AXIN2,BAP1,  BARD1, BLM, BMPR1A, BRCA1, BRCA2, BRIP1, CASR, CDC73, CDH1, CDK4, CDKN1B, CDKN1C, CDKN2A (p14ARF), CDKN2A (p16INK4a), CEBPA, CHEK2, CTNNA1, DICER1, DIS3L2, EGFR (c.2369C>T, p.Thr790Met variant only), EPCAM (Deletion/duplication testing only), FH, FAM175A, FLCN, GATA2, GPC3, GREM1 (Promoter region deletion/duplication testing only), HOXB13 (c.251G>A, p.Gly84Glu), HRAS, KIT, MAX, MEN1, MET, MITF (c.952G>A, p.Glu318Lys variant only), MLH1, MSH2, MSH3, MSH6, MUTYH, NBN, NF1, NF2, NTHL1, PALB2, PDGFRA, PHOX2B, PMS2, POLD1, POLE, POT1, PRKAR1A, PTCH1, PTEN, RAD50, RAD51C, RAD51D, RB1, RECQL4, RET, RUNX1, SDHAF2, SDHA (sequence changes only), SDHB, SDHC, SDHD, SMAD4, SMARCA4, SMARCB1, SMARCE1, STK11, SUFU, TERC, TERT, TMEM127, TP53, TSC1, TSC2, VHL, WRN, WT1, FANCC,MRE11,PIK3CA,RINT1,XRCC2.  A variant of uncertain significance (VUS) in a gene called BRCA1 c.2551G>A (p.Glu851Lys) was also noted. A variant of uncertain significance (VUS) in a gene called CHEK2 c.679G>A (p.Gly227Arg) was also noted. This variant was noted to possibly be mosaic.  A variant of uncertain significance (VUS) in a gene called SDHA c.704T>C (p.Ile235Thr) was also noted.  The test report will be scanned into EPIC and will be located under the Molecular Pathology section of the Results Review tab.A portion  of the result report is included below for reference.      We discussed with Kiara Brown that because current genetic testing is not perfect, it is possible there may be a gene mutation in one of these genes that current testing cannot detect, but that chance is small. We also discussed, that there could be another gene that has not yet been discovered, or that we have not yet tested, that is responsible for the cancer diagnoses in the family. Therefore, it is important to remain in touch with cancer genetics in the future so that we can continue to offer Kiara Brown the most up to date genetic testing.   Regarding the VUS's in [place], [place], and SDHA: At this time, it is unknown if these variants are associated with increased cancer risk or if they are normal findings, but most variants such as these get reclassified to being inconsequential. They should not be used to make medical management decisions. With time, we suspect the lab will determine the significance of these variants, if any. If we do learn more about them, we will try to contact Kiara Brown to discuss it further. However, it is important to stay in touch with [place] periodically and keep the address and phone number up to date.  ADDITIONAL GENETIC TESTING: We discussed with Kiara Brown that her genetic testing was fairly extensive.  If there are are genes identified to increase cancer that can be analyzed in the future, we would be happy to discuss and coordinate this testing at that time.    CANCER SCREENING RECOMMENDATIONS: Based on these negative genetic test results, there areno additional cancer risks we are aware of for Kiara Brown, and no additional screening or medical management that we would recommend for her at this time based on genetic testing.    This result indicates that it is unlikely Kiara. Genna has an increased risk for a future cancer due to a mutation in one of these genes. This normal test also suggests that Kiara. Ledo's  cancer was most likely not due to an inherited predisposition associated with one of these genes.  Most cancers happen by chance and this negative test suggests that her cancer may fall into this category.  Therefore, it is recommended she continue to follow the cancer management and  screening guidelines provided by her oncology and primary healthcare provider. Other factors such as her personal and family history may still affect her cancer risk.  There could also be a hereditary cause for the cancer in her family that Kiara. Kennebrew did not inherit, and therefore was not detected in her.    RECOMMENDATIONS FOR FAMILY MEMBERS: Women in this family might be at some increased risk of developing cancer, over the general population risk, simply due to the family history of cancer. We recommended women in this family have a yearly mammogram beginning at age 60, or 86 years younger than the earliest onset of cancer, an annual clinical breast exam, and perform monthly breast self-exams. Women in this family should also have a gynecological exam as recommended by their primary provider. All family members should have a colonoscopy by age 47.  Based on Kiara. Derstine's family history, we recommended her half-sister and niece who have had breast cancer, have genetic counseling and testing.  Her other relatives may qualify for genetic testing as well.  Kiara. Copen will let us know if we can be of any assistance in coordinating genetic counseling and/or testing for this family member.   FOLLOW-UP: Lastly, we discussed with Kiara. Tabron that cancer genetics is a rapidly advancing field and it is possible that new genetic tests will be appropriate for her and/or her family members in the future. We encouraged her to remain in contact with cancer genetics on an annual basis so we can update her personal and family histories and let her know of advances in cancer genetics that may benefit this family.   Our contact number was  provided. Kiara. Vacca questions were answered to her satisfaction, and she knows she is welcome to call us at anytime with additional questions or concerns.   Ferol Luz, Kiara Genetic Counselor [email or web address]

## 2017-07-19 DIAGNOSIS — M0579 Rheumatoid arthritis with rheumatoid factor of multiple sites without organ or systems involvement: Secondary | ICD-10-CM | POA: Diagnosis not present

## 2017-07-19 DIAGNOSIS — I10 Essential (primary) hypertension: Secondary | ICD-10-CM | POA: Diagnosis not present

## 2017-07-19 DIAGNOSIS — R739 Hyperglycemia, unspecified: Secondary | ICD-10-CM | POA: Diagnosis not present

## 2017-07-19 DIAGNOSIS — C50211 Malignant neoplasm of upper-inner quadrant of right female breast: Secondary | ICD-10-CM | POA: Diagnosis not present

## 2017-07-19 DIAGNOSIS — Z171 Estrogen receptor negative status [ER-]: Secondary | ICD-10-CM | POA: Diagnosis not present

## 2017-07-26 ENCOUNTER — Ambulatory Visit (HOSPITAL_BASED_OUTPATIENT_CLINIC_OR_DEPARTMENT_OTHER): Payer: Medicare Other | Admitting: Oncology

## 2017-07-26 VITALS — BP 99/72 | HR 73 | Temp 97.5°F | Resp 18 | Ht 63.0 in | Wt 265.6 lb

## 2017-07-26 DIAGNOSIS — C50211 Malignant neoplasm of upper-inner quadrant of right female breast: Secondary | ICD-10-CM

## 2017-07-26 DIAGNOSIS — Z170 Estrogen receptor positive status [ER+]: Secondary | ICD-10-CM | POA: Diagnosis not present

## 2017-07-26 MED ORDER — LIDOCAINE-PRILOCAINE 2.5-2.5 % EX CREA: 1.0000 "application " | TOPICAL_CREAM | CUTANEOUS | 0 refills | Status: DC | PRN

## 2017-07-26 NOTE — Addendum Note (Signed)
Addended by: Chauncey Cruel on: 07/26/2017 10:34 AM   Modules accepted: Orders

## 2017-07-26 NOTE — Progress Notes (Signed)
[place]  [phone] [phone]     ID: Kiara Brown DOB: Jul 29, 1941  MR#: [id number]  FWY#:[id number]  Patient Care Team: Burman Freestone, MD as PCP - General (Internal Medicine) Excell Seltzer, MD as Consulting Physician (General Surgery) Magrinat, Virgie Dad, MD as Consulting Physician (Oncology) Gery Pray, MD as Consulting Physician (Radiation Oncology) [place] Crane, MD as Referring Physician (Internal Medicine) Bensimhon, Shaune Pascal, MD as Consulting Physician (Cardiology) Chauncey Cruel, MD OTHER MD:  CHIEF COMPLAINT: Estrogen receptor positive breast cancer  CURRENT TREATMENT: Awaiting definitive surgery   BREAST CANCER HISTORY: From the original intake note:  "Kiara Brown" herself noted a change around her nipple on the right and brought it to medical attention. She was set up for bilateral diagnostic mammography with tomography and right breast ultrasonography at [facility] 05/23/2017. The breast density was category C. In the right breast upper inner quadrant there was a 2.5 cm area of grouped pleomorphic calcifications correlating with the palpable mass. Also there was a 1.2 cm macrolobulated mass in the upper inner quadrant also in the palpable region. These masses were identifiable by ultrasound with the same dimensions. The right axilla was sonographically negative.  On 05/27/2017 the patient underwent biopsy of the 2 masses in question. 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  The patient's subsequent history is as detailed  below.  INTERVAL HISTORY: Kiara Brown returns today for follow-up and treatment of her estrogen receptor positive breast cancer. She continues on anastrozole, with good tolerance.  Hot flashes and vaginal dryness are not a major issue. She never developed the arthralgias or myalgias that many patients can experience on this medication. She obtains it at a good price.  Since her last visit here she underwent genetics testing, which showed no deleterious mutation. There were some variants of uncertain significance noted. These are listed below. She then proceeded to right total mastectomy with sentinel lymph node sampling, on 07/02/2017. The final pathology ([id number]) showed 2 foci of invasive ductal carcinoma, grade 3, measuring 3.2 cm and 2.0 cm. Margins were negative. All 3 sentinel lymph nodes were clear.  Her case was re-presented at the multidisciplinary breast cancer conference 07/17/2017. At that time a decision was made that she did not require chemotherapy or radiation but would only need hormones and anti-HER-2 treatment as planned.  She had a baseline echocardiogram 06/19/2017 she'll elect excellent ejection fraction in the 60-65% range.  REVIEW OF SYSTEMS: She did very well with the surgery, with no significant pain once the drains were removed (which was yesterday). She thinks she may be able to start using a prosthesis so. She has had no fever or bleeding, or other complications. A detailed review of systems today was stable    PAST MEDICAL HISTORY: Past Medical History:  Diagnosis Date  . Arthritis    osteo, rheumatoid  . Breast cancer ([place])   . Diverticulosis   . Dysrhythmia    hx rapid heartbeat, no cardiologist  . Family history of breast cancer   . Family history of prostate cancer   . Fibromyalgia    peripheral neuropathy  .  Fibromyalgia   . GERD (gastroesophageal reflux disease)   . H/O hiatal hernia   . Hyperlipidemia   . PONV (postoperative nausea and vomiting)     "THROAT WITH EXTREME [id number], ulnar nerve surgery  . Sciatica   . Shortness of breath dyspnea    with exertion  . UTI (lower urinary tract infection)     PAST SURGICAL HISTORY: Past Surgical History:  Procedure Laterality Date  . ABDOMINAL HYSTERECTOMY    . APPENDECTOMY    . back injection      July 2018- x1  . BREAST SURGERY Left 1986   biopsy, Recent 05/2017 at [facility]- had biopsy on R breast- malignant   . CARDIAC CATHETERIZATION  2009   "no blockage per pt"  . CHOLECYSTECTOMY    . CYSTOSCOPY WITH BIOPSY    . INCONTINENCE SURGERY    . JOINT REPLACEMENT    . KNEE ARTHROSCOPY     bilateral  . KNEE ARTHROSCOPY Left 03/17/2014   Procedure: ARTHROSCOPY LEFT KNEE WITH SYNOVECTOMY;  Surgeon: Gearlean Alf, MD;  Location: [facility];  Service: Orthopedics;  Laterality: Left;  . PORTACATH PLACEMENT Left 07/02/2017   Procedure: INSERTION PORT-A-CATH;  Surgeon: Excell Seltzer, MD;  Location: [place];  Service: General;  Laterality: Left;  . ROTATOR CUFF REPAIR     right  . SIMPLE MASTECTOMY WITH AXILLARY SENTINEL NODE BIOPSY Right 07/02/2017   Procedure: RIGHT TOTAL  MASTECTOMY WITH AXILLARY SENTINEL LYMPH  NODE BIOPSY;  Surgeon: Excell Seltzer, MD;  Location: [place];  Service: General;  Laterality: Right;  . TOTAL KNEE ARTHROPLASTY  08/04/2012   Procedure: TOTAL KNEE ARTHROPLASTY;  Surgeon: Gearlean Alf, MD;  Location: [facility];  Service: Orthopedics;  Laterality: Left;  . TOTAL KNEE ARTHROPLASTY Right 04/11/2015   Procedure: RIGHT TOTAL KNEE ARTHROPLASTY;  Surgeon: Gaynelle Arabian, MD;  Location: [facility];  Service: Orthopedics;  Laterality: Right;  . ulnar nerve surgery Right 1986    FAMILY HISTORY Family History  Problem Relation Age of Onset  . Hypertension Mother   . Stroke Mother   . Breast cancer Mother 21       died at 29  . Hypertension Father   . Heart attack Father        died at 52  . Hypertension Brother        MI  . Prostate cancer Brother 70  . Hypertension  Brother        MI  . Lung cancer Brother        possible lung cancer, spot found on lung being monitored  . Hypertension Brother        MI  . Non-Hodgkin's lymphoma Sister 6  . Breast cancer Other 63       is currently 90  . Breast cancer Other 55  . Cancer Cousin 62       type of cancer unk.   . Cancer Other        type unknown, eye/face   . Cancer Other 70       died from cancer in his 23's, type unknown   the patient's father died from a heart attack at age 81. The patient's mother was diagnosed with breast cancer at age 62 and died at age 39. The patient had 5 brothers, 3 sisters. One sister had breast cancer at age 46. One brother was diagnosed with prostate cancer at age 49. On Brother died from lung cancer at age 58. One sister had non-Hodgkin's lymphoma diagnosed  at age 2.   GYNECOLOGIC HISTORY:  No LMP recorded. Patient has had a hysterectomy.  menarche age 38, first live birth age 38. All 3 of her births were premature including 1 set of twins. One of the twins died a few hours after birth. She underwent hysterectomy in 1981 with unilateral salpingo-oophorectomy. 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 Her husband Berneta Sages began a business installing fences which is now carried on by his son Dorothea Ogle. Berneta Sages is also the church organist. Son Darnelle Maffucci is a truck Geophysicist/field seismologist in [employer]. The patient has one grandchild. She is a Tourist information centre manager.     ADVANCED DIRECTIVES: In place. The patient's husband is her healthcare power of attorney   HEALTH MAINTENANCE: Social History  Substance Use Topics  . Smoking status: Never Smoker  . Smokeless tobacco: Never Used  . Alcohol use No     Colonoscopy:2017 / [place]   PAP:  Bone density:June 2014, normal    Allergies  Allergen Reactions  . Duloxetine     UNSPECIFIED REACTION   . Nsaids Rash  . Oseltamivir Diarrhea and Other (See  Comments)    Abdominal pain     Current Outpatient Prescriptions  Medication Sig Dispense Refill  . Abatacept (ORENCIA IV) Inject 1,000 mg into the vein every 30 (thirty) days.    Marland Kitchen acetaminophen (TYLENOL) 500 MG tablet Take 500 mg by mouth every 6 (six) hours as needed (Pain). Pain    . amitriptyline (ELAVIL) 10 MG tablet Take 20 mg by mouth at bedtime.     Marland Kitchen anastrozole (ARIMIDEX) 1 MG tablet Take 1 tablet (1 mg total) by mouth daily. 90 tablet 4  . aspirin EC 81 MG tablet Take 81 mg by mouth daily.    Marland Kitchen atenolol (TENORMIN) 50 MG tablet Take 50 mg by mouth daily before breakfast.     . buPROPion (WELLBUTRIN XL) 150 MG 24 hr tablet Take 150 mg by mouth daily before breakfast.    . cyclobenzaprine (FLEXERIL) 5 MG tablet Take 5 mg by mouth at bedtime as needed for muscle spasms.    Marland Kitchen docusate sodium (COLACE) 100 MG capsule Take 1 capsule (100 mg total) by mouth 2 (two) times daily. (Patient taking differently: Take 100 mg by mouth every other day. ) 10 capsule 0  . folic acid (FOLVITE) 1 MG tablet Take 1 mg by mouth daily.    Marland Kitchen omeprazole (PRILOSEC) 40 MG capsule Take 40 mg by mouth every morning.    . ondansetron (ZOFRAN) 4 MG tablet Take 1 tablet (4 mg total) by mouth every 6 (six) hours as needed for nausea. (Patient not taking: Reported on 06/05/2017) 40 tablet 0  . oxyCODONE (OXY IR/ROXICODONE) 5 MG immediate release tablet Take 1 tablet (5 mg total) by mouth every 4 (four) hours as needed for moderate pain. 15 tablet 0  . polyethylene glycol (MIRALAX / GLYCOLAX) packet Take 17 g by mouth daily as needed for mild constipation. 14 each 0  . traMADol (ULTRAM) 50 MG tablet Take 50 mg by mouth daily as needed for pain.     No current facility-administered medications for this visit.     OBJECTIVE: Morbidly obese white woman in no acute distress  Vitals:   07/26/17 0953  BP: 99/72  Pulse: 73  Resp: 18  Temp: (!) 97.5 F (36.4 C)  SpO2: 100%     Body  mass index is 47.05 kg/m.   @.WEIGHTLAST3@    ECOG FS:1 - Symptomatic but completely ambulatory  Sclerae unicteric, pupils round and equal Oropharynx clear and moist No cervical or supraclavicular adenopathy Lungs no rales or rhonchi Heart regular rate and rhythm Abd soft, obese, nontender, positive bowel sounds MSK no focal spinal tenderness, no upper extremity lymphedema Neuro: nonfocal, well oriented, appropriate affect Breasts: The right breast is status post mastectomy. The incision is healing very nicely. There is no dehiscence, there is minimal erythema, there is no swelling. The left breast is benign. Both axillae are benign.  LAB RESULTS:  CMP     Component Value Date/Time   NA 134 (L) 07/03/2017 0524   NA 136 06/05/2017 1257   K 4.7 07/03/2017 0524   K 4.2 06/05/2017 1257   CL 102 07/03/2017 0524   CO2 22 07/03/2017 0524   CO2 25 06/05/2017 1257   GLUCOSE 210 (H) 07/03/2017 0524   GLUCOSE 152 (H) 06/05/2017 1257   BUN 15 07/03/2017 0524   BUN 16.2 06/05/2017 1257   CREATININE 1.03 (H) 07/03/2017 0524   CREATININE 1.0 06/05/2017 1257   CALCIUM 8.3 (L) 07/03/2017 0524   CALCIUM 9.4 06/05/2017 1257   PROT 6.7 06/05/2017 1257   ALBUMIN 3.4 (L) 06/05/2017 1257   AST 18 06/05/2017 1257   ALT 14 06/05/2017 1257   ALKPHOS 102 06/05/2017 1257   BILITOT 0.43 06/05/2017 1257   GFRNONAA 51 (L) 07/03/2017 0524   GFRAA 60 (L) 07/03/2017 0524    No results found for: TOTALPROTELP, ALBUMINELP, A1GS, A2GS, BETS, BETA2SER, GAMS, MSPIKE, SPEI  No results found for: Nils Pyle, [facility]  Lab Results  Component Value Date   WBC 9.3 07/03/2017   NEUTROABS 4.6 06/05/2017   HGB 9.2 (L) 07/03/2017   HCT 29.4 (L) 07/03/2017   MCV 83.8 07/03/2017   PLT 279 07/03/2017      Chemistry      Component Value Date/Time   NA 134 (L) 07/03/2017 0524   NA 136 06/05/2017 1257   K 4.7 07/03/2017 0524   K 4.2 06/05/2017 1257   CL 102 07/03/2017 0524   CO2 22 07/03/2017 0524   CO2 25  06/05/2017 1257   BUN 15 07/03/2017 0524   BUN 16.2 06/05/2017 1257   CREATININE 1.03 (H) 07/03/2017 0524   CREATININE 1.0 06/05/2017 1257      Component Value Date/Time   CALCIUM 8.3 (L) 07/03/2017 0524   CALCIUM 9.4 06/05/2017 1257   ALKPHOS 102 06/05/2017 1257   AST 18 06/05/2017 1257   ALT 14 06/05/2017 1257   BILITOT 0.43 06/05/2017 1257       No results found for: LABCA2  No components found for: [id number]  No results for input(s): INR in the last 168 hours.  Urinalysis    Component Value Date/Time   COLORURINE YELLOW 04/06/2015 1332   APPEARANCEUR CLOUDY (A) 04/06/2015 1332   LABSPEC 1.008 04/06/2015 1332   PHURINE 6.0 04/06/2015 1332   GLUCOSEU NEGATIVE 04/06/2015 1332   HGBUR SMALL (A) 04/06/2015 1332   BILIRUBINUR NEGATIVE 04/06/2015 1332   KETONESUR NEGATIVE 04/06/2015 1332   PROTEINUR NEGATIVE 04/06/2015 1332   UROBILINOGEN 0.2 04/06/2015 1332   NITRITE NEGATIVE 04/06/2015 1332   LEUKOCYTESUR MODERATE (A) 04/06/2015 1332     STUDIES: Dg Chest Port 1 View  Result Date: 07/02/2017 CLINICAL DATA:  Port-A-Cath in place EXAM: PORTABLE CHEST 1 VIEW COMPARISON:  Chest x-ray of 07/07/2015 FINDINGS: A Port-A-Cath has been inserted from the  left with the tip overlying the mid lower SVC. No pneumothorax is seen. The lungs remain clear. Mediastinal and hilar contours are unremarkable, and heart size is stable. No bony abnormality is seen. IMPRESSION: Left-sided Port-A-Cath tip overlies the mid lower SVC. No pneumothorax. Electronically Signed   By: Dwyane Dee M.D.   On: 07/02/2017 16:24   Dg Fluoro Guide Cv Line-no Report  Result Date: 07/02/2017 Fluoroscopy was utilized by the requesting physician.  No radiographic interpretation.     ELIGIBLE FOR AVAILABLE RESEARCH PROTOCOL: No  ASSESSMENT: 76 y.o. [place] woman status post right breast upper inner quadrant biopsy 2 on 05/27/2017 showing a clinical T1c N0, stage IA invasive ductal carcinoma, grade 3,  estrogen and progesterone receptor positive, HER-2 amplified, with an MIB-1 of 60%  (1) genetics testing 07/14/2017 through the Multi-Cancer panel + Breast Cancer Panel found no deleterious mutations in  ALK, APC,AKT1, ATM, AXIN2,BAP1,  BARD1, BLM, BMPR1A, BRCA1, BRCA2, BRIP1, CASR, CDC73, CDH1, CDK4, CDKN1B, CDKN1C, CDKN2A (p14ARF), CDKN2A (p16INK4a), CEBPA, CHEK2, CTNNA1, DICER1, DIS3L2, EGFR (c.2369C>T, p.Thr790Met variant only), EPCAM (Deletion/duplication testing only), FH, FAM175A, FLCN, GATA2, GPC3, GREM1 (Promoter region deletion/duplication testing only), HOXB13 (c.251G>A, p.Gly84Glu), HRAS, KIT, MAX, MEN1, MET, MITF (c.952G>A, p.Glu318Lys variant only), MLH1, MSH2, MSH3, MSH6, MUTYH, NBN, NF1, NF2, NTHL1, PALB2, PDGFRA, PHOX2B, PMS2, POLD1, POLE, POT1, PRKAR1A, PTCH1, PTEN, RAD50, RAD51C, RAD51D, RB1, RECQL4, RET, RUNX1, SDHAF2, SDHA (sequence changes only), SDHB, SDHC, SDHD, SMAD4, SMARCA4, SMARCB1, SMARCE1, STK11, SUFU, TERC, TERT, TMEM127, TP53, TSC1, TSC2, VHL, WRN, WT1, FANCC,MRE11,PIK3CA,RINT1,XRCC2.  (a)  3 Variants of Uncertain significance (VUS) were identified.  A Variant of uncertain significance (VUS) in BRCA1 c.2551G>A (p.Glu851Lys).  One VUS was found in CHEK2 c.679G>A (p.Gly227Arg).  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  Another VUS was identified in SDHA c.704T>C (p.Ile235Thr).  The date of this test report is 07/14/2017.    (2) anastrozole started 06/05/2017, to be continued and minimum of 5 years   (3) status post right mastectomy and sentinel lymph node sampling 07/02/2017 for an mpT2 pN0, stage IB invasive ductal carcinoma, grade 3, with negative margins.  (a) the patient opted against reconstruction  (4) trastuzumab for 6 months to start 08/06/2017  (a) baseline echocardiogram 06/19/2017 shows an excellent ejection fraction  PLAN: I spent approximately 30 minutes with Eulah Citizen with most of that time spent discussing her situation. We reviewed her  pathology report. She understands she had a fast growing aggressive tumor. It had not spread to lymph nodes and she achieved clear margins. Accordingly she would not need adjuvant radiation.  Her case was presented at the 07/17/2017 conference and the decision was that the patient would do well without chemotherapy, only with anti-estrogens given her multiple other problems. We reviewed her echocardiogram, which is very favorable. She will start trastuzumab on September 11. The plan is to continue that for a total of 6 months.  She will need echocardiograms every 3 months until she is done with her treatments.  Today we discussed the possible toxicities, side effects and complications of trastuzumab which generally she should tolerate well. We will see her with the second dose just to make sure that there are no unusual side effects. She will then see me with the fourth dose and we will continue to follow her closely until she is done with her treatments.  Today I wrote her a prescription for prosthesis and also for the EMLA cream she will be using with her port  She knows to  call for any problems that may develop before the next visit here.  Chauncey Cruel, MD   07/26/2017 9:56 AM Medical Oncology and Hematology [facility] [address] [place], [place] [address] Tel. [phone]    Fax. [phone]

## 2017-08-08 ENCOUNTER — Other Ambulatory Visit: Payer: Self-pay

## 2017-08-08 DIAGNOSIS — Z170 Estrogen receptor positive status [ER+]: Principal | ICD-10-CM

## 2017-08-08 DIAGNOSIS — C50211 Malignant neoplasm of upper-inner quadrant of right female breast: Secondary | ICD-10-CM

## 2017-08-08 DIAGNOSIS — C50911 Malignant neoplasm of unspecified site of right female breast: Secondary | ICD-10-CM

## 2017-08-09 ENCOUNTER — Ambulatory Visit: Payer: Medicare Other | Admitting: Adult Health

## 2017-08-09 ENCOUNTER — Other Ambulatory Visit: Payer: Self-pay | Admitting: Oncology

## 2017-08-09 ENCOUNTER — Ambulatory Visit (HOSPITAL_BASED_OUTPATIENT_CLINIC_OR_DEPARTMENT_OTHER): Payer: Medicare Other

## 2017-08-09 ENCOUNTER — Other Ambulatory Visit (HOSPITAL_BASED_OUTPATIENT_CLINIC_OR_DEPARTMENT_OTHER): Payer: Medicare Other

## 2017-08-09 VITALS — BP 122/56 | HR 78 | Temp 97.8°F | Resp 18

## 2017-08-09 DIAGNOSIS — Z170 Estrogen receptor positive status [ER+]: Principal | ICD-10-CM

## 2017-08-09 DIAGNOSIS — C50211 Malignant neoplasm of upper-inner quadrant of right female breast: Secondary | ICD-10-CM | POA: Diagnosis present

## 2017-08-09 DIAGNOSIS — Z5112 Encounter for antineoplastic immunotherapy: Secondary | ICD-10-CM | POA: Diagnosis present

## 2017-08-09 DIAGNOSIS — C50911 Malignant neoplasm of unspecified site of right female breast: Secondary | ICD-10-CM

## 2017-08-09 LAB — CBC WITH DIFFERENTIAL/PLATELET
BASO%: 0.6 % (ref 0.0–2.0)
BASOS ABS: 0 10*3/uL (ref 0.0–0.1)
EOS ABS: 0.1 10*3/uL (ref 0.0–0.5)
EOS%: 1.5 % (ref 0.0–7.0)
HCT: 32.9 % — ABNORMAL LOW (ref 34.8–46.6)
HEMOGLOBIN: 10.6 g/dL — AB (ref 11.6–15.9)
LYMPH%: 29.2 % (ref 14.0–49.7)
MCH: 26.5 pg (ref 25.1–34.0)
MCHC: 32.4 g/dL (ref 31.5–36.0)
MCV: 81.8 fL (ref 79.5–101.0)
MONO#: 0.7 10*3/uL (ref 0.1–0.9)
MONO%: 9.3 % (ref 0.0–14.0)
NEUT#: 4.7 10*3/uL (ref 1.5–6.5)
NEUT%: 59.4 % (ref 38.4–76.8)
PLATELETS: 306 10*3/uL (ref 145–400)
RBC: 4.02 10*6/uL (ref 3.70–5.45)
RDW: 16.5 % — AB (ref 11.2–14.5)
WBC: 7.9 10*3/uL (ref 3.9–10.3)
lymph#: 2.3 10*3/uL (ref 0.9–3.3)

## 2017-08-09 LAB — COMPREHENSIVE METABOLIC PANEL
ALBUMIN: 3.4 g/dL — AB (ref 3.5–5.0)
ALT: 12 U/L (ref 0–55)
ANION GAP: 10 meq/L (ref 3–11)
AST: 17 U/L (ref 5–34)
Alkaline Phosphatase: 101 U/L (ref 40–150)
BILIRUBIN TOTAL: 0.29 mg/dL (ref 0.20–1.20)
BUN: 14.7 mg/dL (ref 7.0–26.0)
CALCIUM: 9.4 mg/dL (ref 8.4–10.4)
CO2: 23 meq/L (ref 22–29)
CREATININE: 1 mg/dL (ref 0.6–1.1)
Chloride: 104 mEq/L (ref 98–109)
EGFR: 54 mL/min/{1.73_m2} — ABNORMAL LOW (ref >90)
Glucose: 148 mg/dl — ABNORMAL HIGH (ref 70–140)
Potassium: 4 mEq/L (ref 3.5–5.1)
SODIUM: 137 meq/L (ref 136–145)
TOTAL PROTEIN: 6.8 g/dL (ref 6.4–8.3)

## 2017-08-09 MED ORDER — METHYLPREDNISOLONE SODIUM SUCC 125 MG IJ SOLR
125.0000 mg | Freq: Once | INTRAMUSCULAR | Status: AC | PRN
  Administered 2017-08-09: 125 mg via INTRAVENOUS

## 2017-08-09 MED ORDER — MEPERIDINE HCL 25 MG/ML IJ SOLN
25.0000 mg | Freq: Once | INTRAMUSCULAR | Status: AC
  Administered 2017-08-09: 25 mg via INTRAVENOUS

## 2017-08-09 MED ORDER — ACETAMINOPHEN 325 MG PO TABS
650.0000 mg | ORAL_TABLET | Freq: Once | ORAL | Status: AC
  Administered 2017-08-09: 650 mg via ORAL

## 2017-08-09 MED ORDER — HEPARIN SOD (PORK) LOCK FLUSH 100 UNIT/ML IV SOLN
500.0000 [IU] | Freq: Once | INTRAVENOUS | Status: AC | PRN
  Administered 2017-08-09: 500 [IU]
  Filled 2017-08-09: qty 5

## 2017-08-09 MED ORDER — DIPHENHYDRAMINE HCL 25 MG PO CAPS
25.0000 mg | ORAL_CAPSULE | Freq: Once | ORAL | Status: AC
  Administered 2017-08-09: 25 mg via ORAL

## 2017-08-09 MED ORDER — SODIUM CHLORIDE 0.9% FLUSH
10.0000 mL | INTRAVENOUS | Status: DC | PRN
  Administered 2017-08-09: 10 mL
  Filled 2017-08-09: qty 10

## 2017-08-09 MED ORDER — SODIUM CHLORIDE 0.9 % IV SOLN: Freq: Once | INTRAVENOUS | Status: DC | PRN

## 2017-08-09 MED ORDER — TRASTUZUMAB CHEMO 150 MG IV SOLR
900.0000 mg | Freq: Once | INTRAVENOUS | Status: AC
  Administered 2017-08-09: 900 mg via INTRAVENOUS
  Filled 2017-08-09: qty 42.86

## 2017-08-09 MED ORDER — DIPHENHYDRAMINE HCL 25 MG PO CAPS
ORAL_CAPSULE | ORAL | Status: AC
  Filled 2017-08-09: qty 1

## 2017-08-09 MED ORDER — SODIUM CHLORIDE 0.9 % IV SOLN
Freq: Once | INTRAVENOUS | Status: AC
  Administered 2017-08-09: 12:00:00 via INTRAVENOUS

## 2017-08-09 MED ORDER — FAMOTIDINE IN NACL 20-0.9 MG/50ML-% IV SOLN
20.0000 mg | Freq: Once | INTRAVENOUS | Status: AC | PRN
  Administered 2017-08-09: 20 mg via INTRAVENOUS

## 2017-08-09 MED ORDER — ACETAMINOPHEN 325 MG PO TABS
ORAL_TABLET | ORAL | Status: AC
  Filled 2017-08-09: qty 2

## 2017-08-09 MED ORDER — MEPERIDINE HCL 25 MG/ML IJ SOLN
INTRAMUSCULAR | Status: AC
  Filled 2017-08-09: qty 1

## 2017-08-09 NOTE — Patient Instructions (Addendum)
[facility] Discharge Instructions for Patients Receiving Chemotherapy  Today you received the following chemotherapy agents Herceptin  To help prevent nausea and vomiting after your treatment, we encourage you to take your nausea medications as directed  If you develop nausea and vomiting that is not controlled by your nausea medication, call the clinic.   BELOW ARE SYMPTOMS THAT SHOULD BE REPORTED IMMEDIATELY:  *FEVER GREATER THAN 100.5 F  *CHILLS WITH OR WITHOUT FEVER  NAUSEA AND VOMITING THAT IS NOT CONTROLLED WITH YOUR NAUSEA MEDICATION  *UNUSUAL SHORTNESS OF BREATH  *UNUSUAL BRUISING OR BLEEDING  TENDERNESS IN MOUTH AND THROAT WITH OR WITHOUT PRESENCE OF ULCERS  *URINARY PROBLEMS  *BOWEL PROBLEMS  UNUSUAL RASH Items with * indicate a potential emergency and should be followed up as soon as possible.  Feel free to call the clinic you have any questions or concerns. The clinic phone number is [phone].  Please show the [facility] at check-in to the Emergency Department and triage nurse.  Trastuzumab injection for infusion What is this medicine? TRASTUZUMAB (tras TOO zoo mab) is a monoclonal antibody. It is used to treat breast cancer and stomach cancer. This medicine may be used for other purposes; ask your health care provider or pharmacist if you have questions. COMMON BRAND NAME(S): Herceptin What should I tell my health care provider before I take this medicine? They need to know if you have any of these conditions: -heart disease -heart failure -lung or breathing disease, like asthma -an unusual or allergic reaction to trastuzumab, benzyl alcohol, or other medications, foods, dyes, or preservatives -pregnant or trying to get pregnant -breast-feeding How should I use this medicine? This drug is given as an infusion into a vein. It is administered in a hospital or clinic by a specially trained health care professional. Talk to your  pediatrician regarding the use of this medicine in children. This medicine is not approved for use in children. Overdosage: If you think you have taken too much of this medicine contact a poison control center or emergency room at once. NOTE: This medicine is only for you. Do not share this medicine with others. What if I miss a dose? It is important not to miss a dose. Call your doctor or health care professional if you are unable to keep an appointment. What may interact with this medicine? This medicine may interact with the following medications: -certain types of chemotherapy, such as daunorubicin, doxorubicin, epirubicin, and idarubicin This list may not describe all possible interactions. Give your health care provider a list of all the medicines, herbs, non-prescription drugs, or dietary supplements you use. Also tell them if you smoke, drink alcohol, or use illegal drugs. Some items may interact with your medicine. What should I watch for while using this medicine? Visit your doctor for checks on your progress. Report any side effects. Continue your course of treatment even though you feel ill unless your doctor tells you to stop. Call your doctor or health care professional for advice if you get a fever, chills or sore throat, or other symptoms of a cold or flu. Do not treat yourself. Try to avoid being around people who are sick. You may experience fever, chills and shaking during your first infusion. These effects are usually mild and can be treated with other medicines. Report any side effects during the infusion to your health care professional. Fever and chills usually do not happen with later infusions. Do not become pregnant while taking this  medicine or for 7 months after stopping it. Women should inform their doctor if they wish to become pregnant or think they might be pregnant. Women of child-bearing potential will need to have a negative pregnancy test before starting this  medicine. There is a potential for serious side effects to an unborn child. Talk to your health care professional or pharmacist for more information. Do not breast-feed an infant while taking this medicine or for 7 months after stopping it. Women must use effective birth control with this medicine. What side effects may I notice from receiving this medicine? Side effects that you should report to your doctor or health care professional as soon as possible: -allergic reactions like skin rash, itching or hives, swelling of the face, lips, or tongue -chest pain or palpitations -cough -dizziness -feeling faint or lightheaded, falls -fever -general ill feeling or flu-like symptoms -signs of worsening heart failure like breathing problems; swelling in your legs and feet -unusually weak or tired Side effects that usually do not require medical attention (report to your doctor or health care professional if they continue or are bothersome): -bone pain -changes in taste -diarrhea -joint pain -nausea/vomiting -weight loss This list may not describe all possible side effects. Call your doctor for medical advice about side effects. You may report side effects to FDA at 1-800-FDA-1088. Where should I keep my medicine? This drug is given in a hospital or clinic and will not be stored at home. NOTE: This sheet is a summary. It may not cover all possible information. If you have questions about this medicine, talk to your doctor, pharmacist, or health care provider.  2018 Elsevier/Gold Standard (2016-11-06 14:37:52)

## 2017-08-09 NOTE — Progress Notes (Unsigned)
Kiara Brown had a reaction to the initial Herceptin. She did not have stridor or shortness of breath, but did have significant back pain and nausea, as well as a symptom of Gen. malaise. The infusion was stopped, she was given steroids and Pepcid as well as Demerol, and the symptoms have completely resolved.  By exam there is no stridor, no rales, no wheezing.  She received approximately two thirds of the initial dose, which probably amounts to about 5 mg/kg. I reassured her that she should not have any symptoms at home later today or next I'm she receives this, 3 weeks from now  If there are any problems so she will let us

## 2017-08-09 NOTE — Progress Notes (Signed)
1st dose herceptin.  Approximately 2/3 of the way into infusion 1327 pt started exhibiting signs of rigors/lower back pain.  Infusion paused, NS ran wide open.  VSS.  Dr. Jana Hakim notified.  1331 125mg  solumedrol administered 1332 pepcid IVPB started.  Charlestine Massed, NP at bedside at this time.  Rigors worsening per pt, 25mg  demerol administered 1335.  Shortly after, rigors stopped, pt stated she was feeling better.  Per Mendel Ryder, will wait approximately 10 minutes, if VSS will rechallenge remainder of herceptin at half previous rate.  1355 pt became diaphoretic, BP 81/39 (see vitals flowsheet), pt had one episode of emesis.  Pt remained hypotensive, c/o feeling faint.  NS restarted wide open.  Dr. Jana Hakim notified. Decision made to hold remainder of dose.  Pt will return for next dose as scheduled.  Pt verbalized understanding.  Pt ambulated to and from restroom without problems.  VSS.  Ok to d/c per Dr. Jana Hakim if pt feeling well, is ambulating without issues, and vss.

## 2017-08-10 ENCOUNTER — Emergency Department (HOSPITAL_COMMUNITY): Payer: Medicare Other

## 2017-08-10 ENCOUNTER — Other Ambulatory Visit: Payer: Self-pay

## 2017-08-10 ENCOUNTER — Encounter (HOSPITAL_COMMUNITY): Payer: Self-pay | Admitting: Nurse Practitioner

## 2017-08-10 ENCOUNTER — Inpatient Hospital Stay (HOSPITAL_COMMUNITY)
Admission: EM | Admit: 2017-08-10 | Discharge: 2017-08-12 | DRG: 193 | Disposition: A | Discharge status: Home / Self Care | Payer: Medicare Other | Attending: Internal Medicine | Admitting: Internal Medicine

## 2017-08-10 DIAGNOSIS — E1142 Type 2 diabetes mellitus with diabetic polyneuropathy: Secondary | ICD-10-CM | POA: Diagnosis present

## 2017-08-10 DIAGNOSIS — Z79899 Other long term (current) drug therapy: Secondary | ICD-10-CM | POA: Diagnosis not present

## 2017-08-10 DIAGNOSIS — R778 Other specified abnormalities of plasma proteins: Secondary | ICD-10-CM

## 2017-08-10 DIAGNOSIS — M797 Fibromyalgia: Secondary | ICD-10-CM | POA: Diagnosis not present

## 2017-08-10 DIAGNOSIS — R079 Chest pain, unspecified: Secondary | ICD-10-CM | POA: Diagnosis not present

## 2017-08-10 DIAGNOSIS — I129 Hypertensive chronic kidney disease with stage 1 through stage 4 chronic kidney disease, or unspecified chronic kidney disease: Secondary | ICD-10-CM | POA: Diagnosis not present

## 2017-08-10 DIAGNOSIS — R0902 Hypoxemia: Secondary | ICD-10-CM

## 2017-08-10 DIAGNOSIS — I10 Essential (primary) hypertension: Secondary | ICD-10-CM | POA: Diagnosis present

## 2017-08-10 DIAGNOSIS — T451X5A Adverse effect of antineoplastic and immunosuppressive drugs, initial encounter: Secondary | ICD-10-CM | POA: Diagnosis present

## 2017-08-10 DIAGNOSIS — J189 Pneumonia, unspecified organism: Principal | ICD-10-CM | POA: Diagnosis present

## 2017-08-10 DIAGNOSIS — E876 Hypokalemia: Secondary | ICD-10-CM | POA: Diagnosis not present

## 2017-08-10 DIAGNOSIS — E119 Type 2 diabetes mellitus without complications: Secondary | ICD-10-CM | POA: Diagnosis not present

## 2017-08-10 DIAGNOSIS — Z9011 Acquired absence of right breast and nipple: Secondary | ICD-10-CM

## 2017-08-10 DIAGNOSIS — N182 Chronic kidney disease, stage 2 (mild): Secondary | ICD-10-CM | POA: Diagnosis present

## 2017-08-10 DIAGNOSIS — J9601 Acute respiratory failure with hypoxia: Secondary | ICD-10-CM | POA: Diagnosis present

## 2017-08-10 DIAGNOSIS — R748 Abnormal levels of other serum enzymes: Secondary | ICD-10-CM | POA: Diagnosis present

## 2017-08-10 DIAGNOSIS — R072 Precordial pain: Secondary | ICD-10-CM

## 2017-08-10 DIAGNOSIS — Z170 Estrogen receptor positive status [ER+]: Secondary | ICD-10-CM

## 2017-08-10 DIAGNOSIS — C50211 Malignant neoplasm of upper-inner quadrant of right female breast: Secondary | ICD-10-CM

## 2017-08-10 DIAGNOSIS — Y92538 Other ambulatory health services establishments as the place of occurrence of the external cause: Secondary | ICD-10-CM | POA: Diagnosis present

## 2017-08-10 DIAGNOSIS — D649 Anemia, unspecified: Secondary | ICD-10-CM | POA: Diagnosis not present

## 2017-08-10 DIAGNOSIS — E669 Obesity, unspecified: Secondary | ICD-10-CM | POA: Diagnosis present

## 2017-08-10 DIAGNOSIS — Z7982 Long term (current) use of aspirin: Secondary | ICD-10-CM | POA: Diagnosis not present

## 2017-08-10 DIAGNOSIS — R0609 Other forms of dyspnea: Secondary | ICD-10-CM | POA: Diagnosis not present

## 2017-08-10 DIAGNOSIS — I361 Nonrheumatic tricuspid (valve) insufficiency: Secondary | ICD-10-CM | POA: Diagnosis not present

## 2017-08-10 DIAGNOSIS — R7989 Other specified abnormal findings of blood chemistry: Secondary | ICD-10-CM | POA: Diagnosis not present

## 2017-08-10 DIAGNOSIS — K219 Gastro-esophageal reflux disease without esophagitis: Secondary | ICD-10-CM | POA: Diagnosis present

## 2017-08-10 DIAGNOSIS — Z6841 Body Mass Index (BMI) 40.0 and over, adult: Secondary | ICD-10-CM

## 2017-08-10 DIAGNOSIS — E1122 Type 2 diabetes mellitus with diabetic chronic kidney disease: Secondary | ICD-10-CM | POA: Diagnosis not present

## 2017-08-10 DIAGNOSIS — R0602 Shortness of breath: Secondary | ICD-10-CM | POA: Diagnosis not present

## 2017-08-10 DIAGNOSIS — E785 Hyperlipidemia, unspecified: Secondary | ICD-10-CM | POA: Diagnosis present

## 2017-08-10 DIAGNOSIS — Z96653 Presence of artificial knee joint, bilateral: Secondary | ICD-10-CM | POA: Diagnosis present

## 2017-08-10 DIAGNOSIS — J9690 Respiratory failure, unspecified, unspecified whether with hypoxia or hypercapnia: Secondary | ICD-10-CM | POA: Diagnosis not present

## 2017-08-10 DIAGNOSIS — I340 Nonrheumatic mitral (valve) insufficiency: Secondary | ICD-10-CM | POA: Diagnosis not present

## 2017-08-10 LAB — CBC WITH DIFFERENTIAL/PLATELET
BASOS ABS: 0 10*3/uL (ref 0.0–0.1)
Basophils Relative: 0 %
EOS PCT: 0 %
Eosinophils Absolute: 0 10*3/uL (ref 0.0–0.7)
HCT: 29.4 % — ABNORMAL LOW (ref 36.0–46.0)
Hemoglobin: 9.5 g/dL — ABNORMAL LOW (ref 12.0–15.0)
Lymphocytes Relative: 18 %
Lymphs Abs: 2.5 10*3/uL (ref 0.7–4.0)
MCH: 26.9 pg (ref 26.0–34.0)
MCHC: 32.3 g/dL (ref 30.0–36.0)
MCV: 83.3 fL (ref 78.0–100.0)
MONO ABS: 0.9 10*3/uL (ref 0.1–1.0)
Monocytes Relative: 6 %
Neutro Abs: 10.7 10*3/uL — ABNORMAL HIGH (ref 1.7–7.7)
Neutrophils Relative %: 76 %
PLATELETS: 269 10*3/uL (ref 150–400)
RBC: 3.53 MIL/uL — AB (ref 3.87–5.11)
RDW: 16.2 % — AB (ref 11.5–15.5)
WBC: 14.1 10*3/uL — AB (ref 4.0–10.5)

## 2017-08-10 LAB — COMPREHENSIVE METABOLIC PANEL
ALT: 34 U/L (ref 14–54)
AST: 43 U/L — AB (ref 15–41)
Albumin: 3.4 g/dL — ABNORMAL LOW (ref 3.5–5.0)
Alkaline Phosphatase: 77 U/L (ref 38–126)
Anion gap: 9 (ref 5–15)
BILIRUBIN TOTAL: 0.5 mg/dL (ref 0.3–1.2)
BUN: 18 mg/dL (ref 6–20)
CALCIUM: 8.8 mg/dL — AB (ref 8.9–10.3)
CO2: 24 mmol/L (ref 22–32)
CREATININE: 1.05 mg/dL — AB (ref 0.44–1.00)
Chloride: 103 mmol/L (ref 101–111)
GFR calc Af Amer: 58 mL/min — ABNORMAL LOW (ref >60)
GFR, EST NON AFRICAN AMERICAN: 50 mL/min — AB (ref >60)
Glucose, Bld: 163 mg/dL — ABNORMAL HIGH (ref 65–99)
Potassium: 3.6 mmol/L (ref 3.5–5.1)
Sodium: 136 mmol/L (ref 135–145)
TOTAL PROTEIN: 6.7 g/dL (ref 6.5–8.1)

## 2017-08-10 LAB — URINALYSIS, ROUTINE W REFLEX MICROSCOPIC
BILIRUBIN URINE: NEGATIVE
Glucose, UA: NEGATIVE mg/dL
Ketones, ur: NEGATIVE mg/dL
NITRITE: NEGATIVE
PH: 6 (ref 5.0–8.0)
Protein, ur: NEGATIVE mg/dL
SPECIFIC GRAVITY, URINE: 1.004 — AB (ref 1.005–1.030)

## 2017-08-10 LAB — PROTIME-INR
INR: 1
PROTHROMBIN TIME: 13.1 s (ref 11.4–15.2)

## 2017-08-10 LAB — TROPONIN I: TROPONIN I: 0.09 ng/mL — AB (ref <0.03)

## 2017-08-10 LAB — CG4 I-STAT (LACTIC ACID): LACTIC ACID, VENOUS: 1.56 mmol/L (ref 0.5–1.9)

## 2017-08-10 LAB — BRAIN NATRIURETIC PEPTIDE: B Natriuretic Peptide: 126.9 pg/mL — ABNORMAL HIGH (ref 0.0–100.0)

## 2017-08-10 MED ORDER — INSULIN ASPART 100 UNIT/ML ~~LOC~~ SOLN
0.0000 [IU] | Freq: Every day | SUBCUTANEOUS | Status: DC
  Administered 2017-08-11: 4 [IU] via SUBCUTANEOUS

## 2017-08-10 MED ORDER — ONDANSETRON HCL 4 MG/2ML IJ SOLN: 4.0000 mg | Freq: Four times a day (QID) | INTRAMUSCULAR | Status: DC | PRN

## 2017-08-10 MED ORDER — CYCLOBENZAPRINE HCL 5 MG PO TABS: 5.0000 mg | ORAL_TABLET | Freq: Every evening | ORAL | Status: DC | PRN

## 2017-08-10 MED ORDER — INSULIN ASPART 100 UNIT/ML ~~LOC~~ SOLN
0.0000 [IU] | Freq: Three times a day (TID) | SUBCUTANEOUS | Status: DC
  Administered 2017-08-11: 2 [IU] via SUBCUTANEOUS
  Administered 2017-08-11 (×2): 1 [IU] via SUBCUTANEOUS
  Administered 2017-08-12: 3 [IU] via SUBCUTANEOUS
  Administered 2017-08-12: 1 [IU] via SUBCUTANEOUS

## 2017-08-10 MED ORDER — FOLIC ACID 1 MG PO TABS
1.0000 mg | ORAL_TABLET | Freq: Every day | ORAL | Status: DC
Start: 2017-08-11 — End: 2017-08-12
  Administered 2017-08-11 – 2017-08-12 (×2): 1 mg via ORAL
  Filled 2017-08-10 (×2): qty 1

## 2017-08-10 MED ORDER — ASPIRIN 81 MG PO CHEW
243.0000 mg | CHEWABLE_TABLET | Freq: Once | ORAL | Status: AC
  Administered 2017-08-10: 243 mg via ORAL
  Filled 2017-08-10: qty 3

## 2017-08-10 MED ORDER — PANTOPRAZOLE SODIUM 40 MG PO TBEC
40.0000 mg | DELAYED_RELEASE_TABLET | Freq: Every day | ORAL | Status: DC
  Administered 2017-08-11 – 2017-08-12 (×2): 40 mg via ORAL
  Filled 2017-08-10 (×2): qty 1

## 2017-08-10 MED ORDER — ASPIRIN EC 81 MG PO TBEC
81.0000 mg | DELAYED_RELEASE_TABLET | Freq: Every day | ORAL | Status: DC
  Administered 2017-08-11 – 2017-08-12 (×2): 81 mg via ORAL
  Filled 2017-08-10 (×2): qty 1

## 2017-08-10 MED ORDER — AMITRIPTYLINE HCL 10 MG PO TABS
20.0000 mg | ORAL_TABLET | Freq: Every day | ORAL | Status: DC
  Administered 2017-08-11 (×2): 20 mg via ORAL
  Filled 2017-08-10 (×2): qty 2

## 2017-08-10 MED ORDER — ACETAMINOPHEN 325 MG PO TABS
650.0000 mg | ORAL_TABLET | ORAL | Status: DC | PRN
  Administered 2017-08-12: 650 mg via ORAL
  Filled 2017-08-10: qty 2

## 2017-08-10 MED ORDER — LIDOCAINE-PRILOCAINE 2.5-2.5 % EX CREA
1.0000 "application " | TOPICAL_CREAM | CUTANEOUS | Status: DC | PRN
  Filled 2017-08-10: qty 5

## 2017-08-10 MED ORDER — NITROGLYCERIN 0.4 MG SL SUBL: 0.4000 mg | SUBLINGUAL_TABLET | SUBLINGUAL | Status: DC | PRN

## 2017-08-10 MED ORDER — HEPARIN SODIUM (PORCINE) 5000 UNIT/ML IJ SOLN
5000.0000 [IU] | Freq: Three times a day (TID) | INTRAMUSCULAR | Status: DC
  Administered 2017-08-11 – 2017-08-12 (×5): 5000 [IU] via SUBCUTANEOUS
  Filled 2017-08-10 (×5): qty 1

## 2017-08-10 MED ORDER — BUPROPION HCL ER (XL) 150 MG PO TB24
150.0000 mg | ORAL_TABLET | Freq: Every day | ORAL | Status: DC
  Administered 2017-08-11 – 2017-08-12 (×2): 150 mg via ORAL
  Filled 2017-08-10 (×2): qty 1

## 2017-08-10 MED ORDER — ALPRAZOLAM 0.25 MG PO TABS: 0.2500 mg | ORAL_TABLET | Freq: Two times a day (BID) | ORAL | Status: DC | PRN

## 2017-08-10 MED ORDER — METOPROLOL TARTRATE 25 MG PO TABS
25.0000 mg | ORAL_TABLET | Freq: Two times a day (BID) | ORAL | Status: DC
  Administered 2017-08-11 – 2017-08-12 (×4): 25 mg via ORAL
  Filled 2017-08-10 (×4): qty 1

## 2017-08-10 MED ORDER — MORPHINE SULFATE (PF) 4 MG/ML IV SOLN: 1.0000 mg | INTRAVENOUS | Status: DC | PRN

## 2017-08-10 NOTE — ED Provider Notes (Signed)
[facility] DEPT Provider Note   CSN: [id number] Arrival date & time: 08/10/17  1845     History   Chief Complaint Chief Complaint  Patient presents with  . Shortness of Breath  . Dizziness  . CA PT    HPI KYLANI WIRES is a 76 y.o. female.   The history is provided by the patient, the spouse, medical records and a relative.  Chest Pain   This is a new problem. The current episode started 6 to 12 hours ago. The problem occurs constantly. The problem has been gradually improving. The pain is associated with exertion. The pain is present in the substernal region. The pain is at a severity of 5/10. The pain is moderate. The quality of the pain is described as exertional, heavy and pressure-like. The pain does not radiate. Duration of episode(s) is 8 hours. The symptoms are aggravated by exertion. Associated symptoms include diaphoresis, exertional chest pressure, nausea, shortness of breath and vomiting. Pertinent negatives include no abdominal pain, no back pain, no cough, no fever, no headaches, no hemoptysis, no near-syncope, no palpitations, no sputum production and no syncope. She has tried nothing for the symptoms. The treatment provided no relief. Risk factors include obesity.  Her past medical history is significant for cancer.    Past Medical History:  Diagnosis Date  . Arthritis    osteo, rheumatoid  . Breast cancer ([place])   . Diverticulosis   . Dysrhythmia    hx rapid heartbeat, no cardiologist  . Family history of breast cancer   . Family history of prostate cancer   . Fibromyalgia    peripheral neuropathy  . Fibromyalgia   . GERD (gastroesophageal reflux disease)   . H/O hiatal hernia   . Hyperlipidemia   . PONV (postoperative nausea and vomiting)    "THROAT WITH EXTREME [id number]"1245, ulnar nerve surgery  . Sciatica   . Shortness of breath dyspnea    with exertion  . UTI (lower urinary tract infection)     Patient Active Problem List   Diagnosis Date  Noted  . Genetic testing 07/10/2017  . Stage I breast cancer, right ([place]) 07/02/2017  . Family history of breast cancer   . Family history of prostate cancer   . Malignant neoplasm of upper-inner quadrant of right breast in female, estrogen receptor positive ([place]) 05/31/2017  . Synovitis of knee 03/16/2014  . Postop Hypokalemia 08/07/2012  . Postop Hyponatremia 08/06/2012  . OA (osteoarthritis) of knee 08/04/2012    Past Surgical History:  Procedure Laterality Date  . ABDOMINAL HYSTERECTOMY    . APPENDECTOMY    . back injection      July 2018- x1  . BREAST SURGERY Left 1986   biopsy, Recent 05/2017 at [facility]- had biopsy on R breast- malignant   . CARDIAC CATHETERIZATION  2009   "no blockage per pt"  . CHOLECYSTECTOMY    . CYSTOSCOPY WITH BIOPSY    . INCONTINENCE SURGERY    . JOINT REPLACEMENT    . KNEE ARTHROSCOPY     bilateral  . KNEE ARTHROSCOPY Left 03/17/2014   Procedure: ARTHROSCOPY LEFT KNEE WITH SYNOVECTOMY;  Surgeon: Gearlean Alf, MD;  Location: [facility];  Service: Orthopedics;  Laterality: Left;  . PORTACATH PLACEMENT Left 07/02/2017   Procedure: INSERTION PORT-A-CATH;  Surgeon: Excell Seltzer, MD;  Location: [place];  Service: General;  Laterality: Left;  . ROTATOR CUFF REPAIR     right  . SIMPLE MASTECTOMY WITH AXILLARY SENTINEL NODE BIOPSY Right 07/02/2017  Procedure: RIGHT TOTAL  MASTECTOMY WITH AXILLARY SENTINEL LYMPH  NODE BIOPSY;  Surgeon: Excell Seltzer, MD;  Location: [place];  Service: General;  Laterality: Right;  . TOTAL KNEE ARTHROPLASTY  08/04/2012   Procedure: TOTAL KNEE ARTHROPLASTY;  Surgeon: Gearlean Alf, MD;  Location: [facility];  Service: Orthopedics;  Laterality: Left;  . TOTAL KNEE ARTHROPLASTY Right 04/11/2015   Procedure: RIGHT TOTAL KNEE ARTHROPLASTY;  Surgeon: Gaynelle Arabian, MD;  Location: [facility];  Service: Orthopedics;  Laterality: Right;  . ulnar nerve surgery Right 1986    OB History    No data available       Home Medications     Prior to Admission medications   Medication Sig Start Date End Date Taking? Authorizing Provider  Abatacept (ORENCIA IV) Inject 1,000 mg into the vein every 30 (thirty) days.    [provider]  acetaminophen (TYLENOL) 500 MG tablet Take 500 mg by mouth every 6 (six) hours as needed (Pain). Pain    [provider]  amitriptyline (ELAVIL) 10 MG tablet Take 20 mg by mouth at bedtime.     [provider]  anastrozole (ARIMIDEX) 1 MG tablet Take 1 tablet (1 mg total) by mouth daily. 06/06/17   Magrinat, Virgie Dad, MD  aspirin EC 81 MG tablet Take 81 mg by mouth daily.    [provider]  atenolol (TENORMIN) 50 MG tablet Take 50 mg by mouth daily before breakfast.     [provider]  buPROPion (WELLBUTRIN XL) 150 MG 24 hr tablet Take 150 mg by mouth daily before breakfast.    [provider]  cyclobenzaprine (FLEXERIL) 5 MG tablet Take 5 mg by mouth at bedtime as needed for muscle spasms.    [provider]  folic acid (FOLVITE) 1 MG tablet Take 1 mg by mouth daily.    [provider]  lidocaine-prilocaine (EMLA) cream Apply 1 application topically as needed. 07/26/17   Magrinat, Virgie Dad, MD  omeprazole (PRILOSEC) 40 MG capsule Take 40 mg by mouth every morning.    [provider]  oxyCODONE (OXY IR/ROXICODONE) 5 MG immediate release tablet Take 1 tablet (5 mg total) by mouth every 4 (four) hours as needed for moderate pain. 07/03/17   Excell Seltzer, MD  traMADol (ULTRAM) 50 MG tablet Take 50 mg by mouth daily as needed for pain. 03/18/17   [provider]    Family History Family History  Problem Relation Age of Onset  . Hypertension Mother   . Stroke Mother   . Breast cancer Mother 15       died at 67  . Hypertension Father   . Heart attack Father        died at 65  . Hypertension Brother        MI  . Prostate cancer Brother 50  . Hypertension Brother        MI  . Lung cancer Brother         possible lung cancer, spot found on lung being monitored  . Hypertension Brother        MI  . Non-Hodgkin's lymphoma Sister 19  . Breast cancer Other 64       is currently 90  . Breast cancer Other 55  . Cancer Cousin 62       type of cancer unk.   . Cancer Other        type unknown, eye/face   . Cancer Other 65  died from cancer in his 90's, type unknown    Social History Social History  Substance Use Topics  . Smoking status: Never Smoker  . Smokeless tobacco: Never Used  . Alcohol use No     Allergies   Duloxetine; Nsaids; and Oseltamivir   Review of Systems Review of Systems  Constitutional: Positive for diaphoresis. Negative for appetite change, chills, fatigue and fever.  HENT: Negative for congestion.   Respiratory: Positive for chest tightness and shortness of breath. Negative for cough, hemoptysis, sputum production, wheezing and stridor.   Cardiovascular: Positive for chest pain and leg swelling (chronic bilateral). Negative for palpitations, syncope and near-syncope.  Gastrointestinal: Positive for nausea and vomiting. Negative for abdominal pain, constipation and diarrhea.  Genitourinary: Negative for dysuria, flank pain and frequency.  Musculoskeletal: Negative for back pain, neck pain and neck stiffness.  Neurological: Positive for light-headedness. Negative for headaches.  Psychiatric/Behavioral: Negative for agitation and confusion.  All other systems reviewed and are negative.    Physical Exam Updated Vital Signs BP (!) 170/70 (BP Location: Left Wrist)   Pulse 69   Temp 97.9 F (36.6 C) (Oral)   Resp 18   Ht 5\' 3"  (1.6 m)   Wt 120.2 kg (265 lb)   SpO2 95%   BMI 46.94 kg/m   Physical Exam  Constitutional: She is oriented to person, place, and time. She appears well-developed and well-nourished. No distress.  HENT:  Head: Normocephalic and atraumatic.  Mouth/Throat: Oropharynx is clear and moist. No oropharyngeal exudate.  Eyes: Pupils  are equal, round, and reactive to light. Conjunctivae and EOM are normal.  Neck: Normal range of motion. Neck supple.  Cardiovascular: Normal rate and intact distal pulses.   No murmur heard. Pulmonary/Chest: Effort normal and breath sounds normal. No stridor. No respiratory distress. She has no wheezes. She has no rales. She exhibits tenderness.  Abdominal: Soft. There is no tenderness.  Musculoskeletal: She exhibits edema. She exhibits no tenderness.  Neurological: She is alert and oriented to person, place, and time. No sensory deficit. She exhibits normal muscle tone.  Skin: Skin is warm and dry. Capillary refill takes less than 2 seconds. No rash noted. She is not diaphoretic. No erythema.  Psychiatric: She has a normal mood and affect.  Nursing note and vitals reviewed.    ED Treatments / Results  Labs (all labs ordered are listed, but only abnormal results are displayed) Labs Reviewed  COMPREHENSIVE METABOLIC PANEL - Abnormal; Notable for the following:       Result Value   Glucose, Bld 163 (*)    Creatinine, Ser 1.05 (*)    Calcium 8.8 (*)    Albumin 3.4 (*)    AST 43 (*)    GFR calc non Af Amer 50 (*)    GFR calc Af Amer 58 (*)    All other components within normal limits  CBC WITH DIFFERENTIAL/PLATELET - Abnormal; Notable for the following:    WBC 14.1 (*)    RBC 3.53 (*)    Hemoglobin 9.5 (*)    HCT 29.4 (*)    RDW 16.2 (*)    Neutro Abs 10.7 (*)    All other components within normal limits  URINALYSIS, ROUTINE W REFLEX MICROSCOPIC - Abnormal; Notable for the following:    Color, Urine STRAW (*)    Specific Gravity, Urine 1.004 (*)    Hgb urine dipstick SMALL (*)    Leukocytes, UA LARGE (*)    Bacteria, UA RARE (*)  Squamous Epithelial / LPF 0-5 (*)    All other components within normal limits  TROPONIN I - Abnormal; Notable for the following:    Troponin I 0.09 (*)    All other components within normal limits  BRAIN NATRIURETIC PEPTIDE - Abnormal; Notable  for the following:    B Natriuretic Peptide 126.9 (*)    All other components within normal limits  TROPONIN I - Abnormal; Notable for the following:    Troponin I 0.09 (*)    All other components within normal limits  CULTURE, BLOOD (ROUTINE X 2)  CULTURE, BLOOD (ROUTINE X 2)  PROTIME-INR  TROPONIN I  TROPONIN I  BASIC METABOLIC PANEL  CBC  LIPID PANEL  TROPONIN I  I-STAT CG4 LACTIC ACID, ED  CG4 I-STAT (LACTIC ACID)    EKG  EKG Interpretation None      ED ECG REPORT   Date: 08/11/2017  Rate: 66  Rhythm: normal sinus rhythm  QRS Axis: normal  Intervals: normal  ST/T Wave abnormalities: normal  Conduction Disutrbances:none  Narrative Interpretation:   Old EKG Reviewed: none available  I have personally reviewed the EKG tracing and agree with the computerized printout as noted.   Radiology Dg Chest 2 View  Result Date: 08/10/2017 CLINICAL DATA:  Breast carcinoma.  Shortness of breath.  Chills. EXAM: CHEST  2 VIEW COMPARISON:  July 02, 2017 FINDINGS: There is no edema or consolidation. Heart size and pulmonary vascularity are normal. No adenopathy. Port-A-Cath tip is in the superior vena cava. No pneumothorax. No evident bone lesions. Patient is status post right mastectomy. IMPRESSION: No edema or consolidation. Electronically Signed   By: Lowella Grip III M.D.   On: 08/10/2017 19:15    Procedures Procedures (including critical care time)  CRITICAL CARE Performed by: Gwenyth Allegra Saia Derossett Total critical care time: 35 minutes Critical care time was exclusive of separately billable procedures and treating other patients. Chest pain with positive troponin requiring admission with cardiology and hospitalist consultation.  Critical care was necessary to treat or prevent imminent or life-threatening deterioration. Critical care was time spent personally by me on the following activities: development of treatment plan with patient and/or surrogate as well as  nursing, discussions with consultants, evaluation of patient's response to treatment, examination of patient, obtaining history from patient or surrogate, ordering and performing treatments and interventions, ordering and review of laboratory studies, ordering and review of radiographic studies, pulse oximetry and re-evaluation of patient's condition.   Medications Ordered in ED Medications  lidocaine-prilocaine (EMLA) cream 1 application (not administered)  aspirin EC tablet 81 mg (not administered)  amitriptyline (ELAVIL) tablet 20 mg (not administered)  cyclobenzaprine (FLEXERIL) tablet 5 mg (not administered)  pantoprazole (PROTONIX) EC tablet 40 mg (not administered)  folic acid (FOLVITE) tablet 1 mg (not administered)  buPROPion (WELLBUTRIN XL) 24 hr tablet 150 mg (not administered)  metoprolol tartrate (LOPRESSOR) tablet 25 mg (not administered)  nitroGLYCERIN (NITROSTAT) SL tablet 0.4 mg (not administered)  acetaminophen (TYLENOL) tablet 650 mg (not administered)  ondansetron (ZOFRAN) injection 4 mg (not administered)  heparin injection 5,000 Units (not administered)  ALPRAZolam (XANAX) tablet 0.25 mg (not administered)  insulin aspart (novoLOG) injection 0-9 Units (not administered)  insulin aspart (novoLOG) injection 0-5 Units (not administered)  morphine 4 MG/ML injection 1-3 mg (not administered)  aspirin chewable tablet 243 mg (243 mg Oral Given 08/10/17 2234)     Initial Impression / Assessment and Plan / ED Course  I have reviewed the triage vital signs and  the nursing notes.  Pertinent labs & imaging results that were available during my care of the patient were reviewed by me and considered in my medical decision making (see chart for details).     VALAREE FRESQUEZ is a 76 y.o. female with a past medical history significant for fibromyalgia, GERD, hyperlipidemia, and breast cancer with recent abnormal reaction to Herceptin infusion who presents with nausea, vomiting,  exertional shortness of breath, and chest pain. Patient says that yesterday was her first Herceptin infusion and during the treatment, she began having abnormal reaction. She says that she had lightheadedness, nausea, vomiting, diaphoresis, and got hypotensive. She said that she was given steroids, Pepcid as they were concerned it was allergic reaction.It does not appear the patient received epinephrine. Patient then felt better after about 45 minutes when the medication was stopped. Patient was given return precautions at that time. Patient says that she did not sleep overnight and then today has had gradually worsening chest pain and shortness of breath. She says that her chest pain is a pressure in her central left chest. It does not radiate. It is worsened with exertion. It is nonpleuritic. She says she is also having exertional shortness of breath. She says that she has had exertional shortness of breath with stairs in the past but currently any exertion of walking causes her to be winded. She described her pain as a 4 out of 10 in severity at this time. She takes a baby aspirin but did not take any extra. She denies any new leg pain or leg swelling. She denies abdominal pain. She does report nausea still and feeling lightheaded. She says her blood pressure was on the lower side today with a pressure in the 110 range which is normally much higher. She denies any rashes or other symptoms.  On exam, patient has mild crackles in the bases of the lungs. Legs were edematous and unchanged from prior per patient. Abdomen is nontender. Chest is slightly tender but she says that this is not the same pressure she is feeling. Patient has symmetric pulses in upper extremities. Patient had no focal neurologic deficits.  Given patient's description of pressure-like chest pain, diaphoresis, nausea, lightheadedness, and exertional shortness of breath, patient will have workup to look for etiology of symptoms including a  cardiac cause. Patient will have troponins cycled.  Initial troponin positive at 0.09. Initial EKG showed no evidence of acute ischemia. Other laboratory testing showed BNP with mild elevation of 126. Mild leukocytosis present at 14.1 and mild anemia of 9.5. Metabolic panel reassuring. No urinary symptoms present and patient had no nitrites, doubt UTI. Lactic acid normal.  Chest x-ray showed no edema or consolidation concerning for pneumonia.  Due to the patient's ongoing chest pressure with a positive troponin, cardiology will be called. Pulmonary embolism was considered however pain is nonpleuritic and symptoms were gradual after her reaction to the Herceptin.   11:14 PM Cardiology was called and recommended admission. They're recommending admission to the hospitalist service at [name] due to her other medical problems including her oncologic management.   Hospitalist team will be called.   Final Clinical Impressions(s) / ED Diagnoses   Final diagnoses:  Precordial chest pain  Elevated troponin    New Prescriptions New Prescriptions   No medications on file    Clinical Impression: No diagnosis found.  Disposition: Admit to Hospitalist service     Jenese Mischke, Gwenyth Allegra, MD 08/11/17 [phone]

## 2017-08-10 NOTE — ED Triage Notes (Signed)
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 She experienced hypotension and dizziness which at the time resolved. She was given precautions to go to the ED should she experience similar symptoms while at home. She adds that she is experiencing ShOB.

## 2017-08-10 NOTE — H&P (Addendum)
History and Physical    Kiara Brown:[id number] DOB: 1941-04-27 DOA: 08/10/2017  PCP: Burman Freestone, MD   Patient coming from: Home  Chief Complaint: Chest pressure, SOB   HPI: Kiara Brown is a 76 y.o. female with medical history significant for hypertension, depression, and recent diagnosis of breast cancer status post right mastectomy, now presenting to the emergency department with exertional chest pressure and dyspnea. Patient was at the cancer center yesterday for her first Herceptin infusion, but the infusion had to be stopped due to a reaction marked by hypotension, diaphoresis, and rigors. Since returning home, patient has been experiencing a moderate, pressure-like, discomfort, localized to the left chest, worse with exertion, better with rest, and associated with exertional dyspnea. She had never experienced these symptoms previously. She also reports some mild associated nausea. She took her usual baby aspirin this morning. Denies cough, fever, or chills.   ED Course: Upon arrival to the ED, patient is found to be afebrile, saturating well on room air, and with vitals otherwise stable. EKG features normal sinus rhythm and chest x-ray is negative for edema or consolidation. Chemistry panel reveals a serum creatinine 1.05 which appears consistent with her baseline. CBC was notable for new leukocytosis to 14,100 and a stable normocytic anemia with hemoglobin of 9.5. Urinalysis features a low specific gravity. Lactic acid is reassuring at 1.56, INR is normal, BNP is mildly elevated 227, and troponin is elevated to 0.09. Patient was treated with Trovan and 43 mg of aspirin in the emergency department and blood cultures were collected. Cardiology was consulted by the ED physician and recommended a medical admission to [facility] where they will see the patient in consultation. Patient remained hemodynamically stable in the ED with no acute respiratory distress, but  continues to experience left-sided chest pressure with an elevated troponin concerning for possible ACS. She was to be admitted to the stepdown unit at [facility] for ongoing evaluation and management of this, but no bed was available, and no anticipated openings, and so patient will be admitted to [facility] for initial phase of care.  Review of Systems:  All other systems reviewed and apart from HPI, are negative.  Past Medical History:  Diagnosis Date  . Arthritis    osteo, rheumatoid  . Breast cancer ([place])   . Diverticulosis   . Dysrhythmia    hx rapid heartbeat, no cardiologist  . Family history of breast cancer   . Family history of prostate cancer   . Fibromyalgia    peripheral neuropathy  . Fibromyalgia   . GERD (gastroesophageal reflux disease)   . H/O hiatal hernia   . Hyperlipidemia   . PONV (postoperative nausea and vomiting)    "THROAT WITH EXTREME [id number]"8295, ulnar nerve surgery  . Sciatica   . Shortness of breath dyspnea    with exertion  . UTI (lower urinary tract infection)     Past Surgical History:  Procedure Laterality Date  . ABDOMINAL HYSTERECTOMY    . APPENDECTOMY    . back injection      July 2018- x1  . BREAST SURGERY Left 1986   biopsy, Recent 05/2017 at [facility]- had biopsy on R breast- malignant   . CARDIAC CATHETERIZATION  2009   "no blockage per pt"  . CHOLECYSTECTOMY    . CYSTOSCOPY WITH BIOPSY    . INCONTINENCE SURGERY    . JOINT REPLACEMENT    . KNEE ARTHROSCOPY     bilateral  . KNEE  ARTHROSCOPY Left 03/17/2014   Procedure: ARTHROSCOPY LEFT KNEE WITH SYNOVECTOMY;  Surgeon: Gearlean Alf, MD;  [facility];  Service: Orthopedics;  Laterality: Left;  . PORTACATH PLACEMENT Left 07/02/2017   Procedure: INSERTION PORT-A-CATH;  Surgeon: Excell Seltzer, MD;  Location: [place];  Service: General;  Laterality: Left;  . ROTATOR CUFF REPAIR     right  . SIMPLE MASTECTOMY WITH AXILLARY SENTINEL NODE BIOPSY Right 07/02/2017   Procedure: RIGHT  TOTAL  MASTECTOMY WITH AXILLARY SENTINEL LYMPH  NODE BIOPSY;  Surgeon: Excell Seltzer, MD;  Location: [place];  Service: General;  Laterality: Right;  . TOTAL KNEE ARTHROPLASTY  08/04/2012   Procedure: TOTAL KNEE ARTHROPLASTY;  Surgeon: Gearlean Alf, MD;  [facility];  Service: Orthopedics;  Laterality: Left;  . TOTAL KNEE ARTHROPLASTY Right 04/11/2015   Procedure: RIGHT TOTAL KNEE ARTHROPLASTY;  Surgeon: Gaynelle Arabian, MD;  [facility];  Service: Orthopedics;  Laterality: Right;  . ulnar nerve surgery Right 1986     reports that she has never smoked. She has never used smokeless tobacco. She reports that she does not drink alcohol or use drugs.  Allergies  Allergen Reactions  . Duloxetine     UNSPECIFIED REACTION   . Nsaids Rash  . Oseltamivir Diarrhea and Other (See Comments)    Abdominal pain     Family History  Problem Relation Age of Onset  . Hypertension Mother   . Stroke Mother   . Breast cancer Mother 52       died at 60  . Hypertension Father   . Heart attack Father        died at 16  . Hypertension Brother        MI  . Prostate cancer Brother 57  . Hypertension Brother        MI  . Lung cancer Brother        possible lung cancer, spot found on lung being monitored  . Hypertension Brother        MI  . Non-Hodgkin's lymphoma Sister 32  . Breast cancer Other 26       is currently 90  . Breast cancer Other 55  . Cancer Cousin 62       type of cancer unk.   . Cancer Other        type unknown, eye/face   . Cancer Other 28       died from cancer in his 55's, type unknown     Prior to Admission medications   Medication Sig Start Date End Date Taking? Authorizing Provider  acetaminophen (TYLENOL) 500 MG tablet Take 500 mg by mouth every 6 (six) hours as needed (Pain). Pain   Yes [provider]  amitriptyline (ELAVIL) 10 MG tablet Take 20 mg by mouth at bedtime.    Yes [provider]  anastrozole (ARIMIDEX) 1 MG tablet Take 1  tablet (1 mg total) by mouth daily. 06/06/17  Yes Magrinat, Virgie Dad, MD  aspirin EC 81 MG tablet Take 81 mg by mouth daily.   Yes [provider]  atenolol (TENORMIN) 50 MG tablet Take 50 mg by mouth daily before breakfast.    Yes [provider]  buPROPion (WELLBUTRIN XL) 150 MG 24 hr tablet Take 150 mg by mouth daily before breakfast.   Yes [provider]  cyclobenzaprine (FLEXERIL) 5 MG tablet Take 5 mg by mouth at bedtime as needed for muscle spasms.   Yes [provider]  folic  acid (FOLVITE) 1 MG tablet Take 1 mg by mouth daily.   Yes [provider]  lidocaine-prilocaine (EMLA) cream Apply 1 application topically as needed. 07/26/17  Yes Magrinat, Virgie Dad, MD  omeprazole (PRILOSEC) 40 MG capsule Take 40 mg by mouth every morning.   Yes [provider]  Abatacept (ORENCIA IV) Inject 1,000 mg into the vein every 30 (thirty) days.    [provider]  oxyCODONE (OXY IR/ROXICODONE) 5 MG immediate release tablet Take 1 tablet (5 mg total) by mouth every 4 (four) hours as needed for moderate pain. Patient not taking: Reported on 08/10/2017 07/03/17   Excell Seltzer, MD    Physical Exam: Vitals:   08/10/17 1852 08/10/17 2133 08/10/17 2201  BP: (!) 170/70  (!) 164/82  Pulse: 69  79  Resp: 18  20  Temp: 97.9 F (36.6 C)  98 F (36.7 C)  TempSrc: Oral  Oral  SpO2: 95%  100%  Weight:  120.2 kg (265 lb)   Height:  5\' 3"  (1.6 m)       Constitutional: NAD, calm, obese Eyes: PERTLA, lids and conjunctivae normal ENMT: Mucous membranes are moist. Posterior pharynx clear of any exudate or lesions.   Neck: normal, supple, no masses, no thyromegaly Respiratory: clear to auscultation bilaterally, no wheezing, no crackles. Normal respiratory effort.   Cardiovascular: S1 & S2 heard, regular rate and rhythm. No carotid bruits. No significant JVD. Abdomen: No distension, no tenderness, no masses palpated. Bowel sounds normal.    Musculoskeletal: no clubbing / cyanosis. No joint deformity upper and lower extremities.   Skin: no significant rashes, lesions, ulcers. Warm, dry, well-perfused. Neurologic: CN 2-12 grossly intact. Sensation intact. Strength 5/5 in all 4 limbs.  Psychiatric: Alert and oriented x 3. Pleasant and cooperative.     Labs on Admission: I have personally reviewed following labs and imaging studies  CBC:  Recent Labs Lab 08/09/17 1014 08/10/17 1936  WBC 7.9 14.1*  NEUTROABS 4.7 10.7*  HGB 10.6* 9.5*  HCT 32.9* 29.4*  MCV 81.8 83.3  PLT 306 161   Basic Metabolic Panel:  Recent Labs Lab 08/09/17 1014 08/10/17 1936  NA 137 136  K 4.0 3.6  CL  --  103  CO2 23 24  GLUCOSE 148* 163*  BUN 14.7 18  CREATININE 1.0 1.05*  CALCIUM 9.4 8.8*   GFR: Estimated Creatinine Clearance: 57.2 mL/min (A) (by C-G formula based on SCr of 1.05 mg/dL (H)). Liver Function Tests:  Recent Labs Lab 08/09/17 1014 08/10/17 1936  AST 17 43*  ALT 12 34  ALKPHOS 101 77  BILITOT 0.29 0.5  PROT 6.8 6.7  ALBUMIN 3.4* 3.4*   No results for input(s): LIPASE, AMYLASE in the last 168 hours. No results for input(s): AMMONIA in the last 168 hours. Coagulation Profile:  Recent Labs Lab 08/10/17 1936  INR 1.00   Cardiac Enzymes:  Recent Labs Lab 08/10/17 1936  TROPONINI 0.09*   BNP (last 3 results) No results for input(s): PROBNP in the last 8760 hours. HbA1C: No results for input(s): HGBA1C in the last 72 hours. CBG: No results for input(s): GLUCAP in the last 168 hours. Lipid Profile: No results for input(s): CHOL, HDL, LDLCALC, TRIG, CHOLHDL, LDLDIRECT in the last 72 hours. Thyroid Function Tests: No results for input(s): TSH, T4TOTAL, FREET4, T3FREE, THYROIDAB in the last 72 hours. Anemia Panel: No results for input(s): VITAMINB12, FOLATE, FERRITIN, TIBC, IRON, RETICCTPCT in the last 72 hours. Urine analysis:    Component Value Date/Time  COLORURINE STRAW (A) 08/10/2017 1954    APPEARANCEUR CLEAR 08/10/2017 1954   LABSPEC 1.004 (L) 08/10/2017 1954   PHURINE 6.0 08/10/2017 1954   GLUCOSEU NEGATIVE 08/10/2017 1954   HGBUR SMALL (A) 08/10/2017 [place] NEGATIVE 08/10/2017 [place] NEGATIVE 08/10/2017 1954   PROTEINUR NEGATIVE 08/10/2017 1954   UROBILINOGEN 0.2 04/06/2015 1332   NITRITE NEGATIVE 08/10/2017 1954   LEUKOCYTESUR LARGE (A) 08/10/2017 1954   Sepsis Labs: @LABRCNTIP (procalcitonin:4,lacticidven:4) )No results found for this or any previous visit (from the past 240 hour(s)).   Radiological Exams on Admission: Dg Chest 2 View  Result Date: 08/10/2017 CLINICAL DATA:  Breast carcinoma.  Shortness of breath.  Chills. EXAM: CHEST  2 VIEW COMPARISON:  July 02, 2017 FINDINGS: There is no edema or consolidation. Heart size and pulmonary vascularity are normal. No adenopathy. Port-A-Cath tip is in the superior vena cava. No pneumothorax. No evident bone lesions. Patient is status post right mastectomy. IMPRESSION: No edema or consolidation. Electronically Signed   By: Lowella Grip III M.D.   On: 08/10/2017 19:15    EKG: Independently reviewed. Normal sinus rhythm.   Assessment/Plan  1. Chest pain, elevated troponin - Pt presents with exertional chest pressure and SOB  - CXR unremarkable, EKG with no appreciable ischemic features, initial troponin elevated to 0.09  - She took an ASA 81 at home and was treated with 243 mg ASA in ED  - Cardiology is consulting and much appreciated, will follow-up on recommendations - Plan to continue cardiac monitoring, trend troponin, repeat EKG, start Lopressor given elevated BP, use NTG prn    2. Breast cancer  - Status-post right mastectomy  - Had first Herceptin infusion yesterday, but was stopped d/t adverse reaction  - Hold Arimidex while evaluating possible ACS    3. Anemia  - Hgb is 9.5 on admission  - Stable relative to priors and with no bleeding evident    4. Hypertension - BP mildly  elevated in ED  - Managed at home with daily atenolol; will change to Lopressor BID in setting of possible ACS and give a dose now    5. Type II DM   - A1c was 8% last month  - Has been diet-controlled  - Check CBG with meals and qHS, start a low-intensity SSI with Novolog   6. CKD stage II  - SCr is 1.05 on admission, consistent with her apparent baseline  - Plan to avoid hypotension or dehydration, avoid nephrotoxins where feasible, repeat chem panel in am     DVT prophylaxis: sq heparin  Code Status: Full  Family Communication: Discussed with patient Disposition Plan: Admit to telemetry Consults called: Cardiology Admission status: Inpatient    Vianne Bulls, MD [facility] Pager [phone]  If 7PM-7AM, please contact night-coverage [email or web address] Password [facility]  08/10/2017, 11:38 PM

## 2017-08-11 ENCOUNTER — Inpatient Hospital Stay (HOSPITAL_COMMUNITY): Payer: Medicare Other

## 2017-08-11 DIAGNOSIS — R0609 Other forms of dyspnea: Secondary | ICD-10-CM

## 2017-08-11 DIAGNOSIS — I340 Nonrheumatic mitral (valve) insufficiency: Secondary | ICD-10-CM

## 2017-08-11 DIAGNOSIS — I361 Nonrheumatic tricuspid (valve) insufficiency: Secondary | ICD-10-CM

## 2017-08-11 DIAGNOSIS — R079 Chest pain, unspecified: Secondary | ICD-10-CM

## 2017-08-11 LAB — ECHOCARDIOGRAM COMPLETE
Height: 63 in
Weight: 4208.14 oz

## 2017-08-11 LAB — LIPID PANEL
Cholesterol: 153 mg/dL (ref 0–200)
HDL: 38 mg/dL — ABNORMAL LOW (ref >40)
LDL CALC: 84 mg/dL (ref 0–99)
TRIGLYCERIDES: 153 mg/dL — AB (ref <150)
Total CHOL/HDL Ratio: 4 RATIO
VLDL: 31 mg/dL (ref 0–40)

## 2017-08-11 LAB — CBC
HEMATOCRIT: 28.8 % — AB (ref 36.0–46.0)
Hemoglobin: 9.3 g/dL — ABNORMAL LOW (ref 12.0–15.0)
MCH: 26.4 pg (ref 26.0–34.0)
MCHC: 32.3 g/dL (ref 30.0–36.0)
MCV: 81.8 fL (ref 78.0–100.0)
PLATELETS: 232 10*3/uL (ref 150–400)
RBC: 3.52 MIL/uL — ABNORMAL LOW (ref 3.87–5.11)
RDW: 16.1 % — AB (ref 11.5–15.5)
WBC: 9.7 10*3/uL (ref 4.0–10.5)

## 2017-08-11 LAB — BASIC METABOLIC PANEL
Anion gap: 7 (ref 5–15)
BUN: 18 mg/dL (ref 6–20)
CALCIUM: 8.5 mg/dL — AB (ref 8.9–10.3)
CO2: 23 mmol/L (ref 22–32)
CREATININE: 1.04 mg/dL — AB (ref 0.44–1.00)
Chloride: 105 mmol/L (ref 101–111)
GFR calc Af Amer: 59 mL/min — ABNORMAL LOW (ref >60)
GFR calc non Af Amer: 51 mL/min — ABNORMAL LOW (ref >60)
GLUCOSE: 150 mg/dL — AB (ref 65–99)
Potassium: 3.4 mmol/L — ABNORMAL LOW (ref 3.5–5.1)
SODIUM: 135 mmol/L (ref 135–145)

## 2017-08-11 LAB — GLUCOSE, CAPILLARY
GLUCOSE-CAPILLARY: 145 mg/dL — AB (ref 65–99)
GLUCOSE-CAPILLARY: 191 mg/dL — AB (ref 65–99)
GLUCOSE-CAPILLARY: 301 mg/dL — AB (ref 65–99)
Glucose-Capillary: 136 mg/dL — ABNORMAL HIGH (ref 65–99)
Glucose-Capillary: 149 mg/dL — ABNORMAL HIGH (ref 65–99)

## 2017-08-11 LAB — TROPONIN I
TROPONIN I: 0.08 ng/mL — AB (ref <0.03)
TROPONIN I: 0.08 ng/mL — AB (ref <0.03)
Troponin I: 0.09 ng/mL (ref <0.03)
Troponin I: 0.08 ng/mL (ref <0.03)

## 2017-08-11 LAB — BRAIN NATRIURETIC PEPTIDE: B Natriuretic Peptide: 231.1 pg/mL — ABNORMAL HIGH (ref 0.0–100.0)

## 2017-08-11 MED ORDER — ALBUTEROL SULFATE (2.5 MG/3ML) 0.083% IN NEBU
2.5000 mg | INHALATION_SOLUTION | Freq: Two times a day (BID) | RESPIRATORY_TRACT | Status: DC
  Administered 2017-08-11 – 2017-08-12 (×2): 2.5 mg via RESPIRATORY_TRACT
  Filled 2017-08-11 (×2): qty 3

## 2017-08-11 MED ORDER — IOPAMIDOL (ISOVUE-370) INJECTION 76%
INTRAVENOUS | Status: AC
  Administered 2017-08-11: 100 mL via INTRAVENOUS
  Filled 2017-08-11: qty 100

## 2017-08-11 MED ORDER — FAMOTIDINE IN NACL 20-0.9 MG/50ML-% IV SOLN
20.0000 mg | Freq: Two times a day (BID) | INTRAVENOUS | Status: DC
  Administered 2017-08-11 (×2): 20 mg via INTRAVENOUS
  Filled 2017-08-11 (×3): qty 50

## 2017-08-11 MED ORDER — BUDESONIDE 0.25 MG/2ML IN SUSP
0.2500 mg | Freq: Two times a day (BID) | RESPIRATORY_TRACT | Status: DC
  Administered 2017-08-11 – 2017-08-12 (×3): 0.25 mg via RESPIRATORY_TRACT
  Filled 2017-08-11 (×3): qty 2

## 2017-08-11 MED ORDER — METHYLPREDNISOLONE SODIUM SUCC 40 MG IJ SOLR: 40.0000 mg | Freq: Once | INTRAMUSCULAR | Status: AC

## 2017-08-11 MED ORDER — SODIUM CHLORIDE 0.9% FLUSH: 10.0000 mL | INTRAVENOUS | Status: DC | PRN

## 2017-08-11 MED ORDER — SODIUM CHLORIDE 0.9 % IV SOLN
INTRAVENOUS | Status: DC
  Administered 2017-08-11: 12:00:00 via INTRAVENOUS

## 2017-08-11 MED ORDER — HYDRALAZINE HCL 20 MG/ML IJ SOLN: 10.0000 mg | Freq: Four times a day (QID) | INTRAMUSCULAR | Status: DC | PRN

## 2017-08-11 MED ORDER — ALTEPLASE 2 MG IJ SOLR
2.0000 mg | Freq: Once | INTRAMUSCULAR | Status: AC
  Administered 2017-08-11: 2 mg
  Filled 2017-08-11: qty 2

## 2017-08-11 MED ORDER — AMLODIPINE BESYLATE 5 MG PO TABS
5.0000 mg | ORAL_TABLET | Freq: Every day | ORAL | Status: DC
  Administered 2017-08-11 – 2017-08-12 (×2): 5 mg via ORAL
  Filled 2017-08-11 (×2): qty 1

## 2017-08-11 MED ORDER — METHYLPREDNISOLONE SODIUM SUCC 40 MG IJ SOLR
40.0000 mg | Freq: Two times a day (BID) | INTRAMUSCULAR | Status: DC
  Administered 2017-08-11: 40 mg via INTRAVENOUS
  Filled 2017-08-11: qty 1

## 2017-08-11 MED ORDER — HYDRALAZINE HCL 20 MG/ML IJ SOLN
10.0000 mg | Freq: Four times a day (QID) | INTRAMUSCULAR | Status: DC | PRN
  Administered 2017-08-11: 10 mg via INTRAVENOUS
  Filled 2017-08-11: qty 1

## 2017-08-11 MED ORDER — POTASSIUM CHLORIDE CRYS ER 20 MEQ PO TBCR
40.0000 meq | EXTENDED_RELEASE_TABLET | Freq: Once | ORAL | Status: AC
  Administered 2017-08-11: 40 meq via ORAL
  Filled 2017-08-11: qty 2

## 2017-08-11 MED ORDER — PREDNISONE 20 MG PO TABS
60.0000 mg | ORAL_TABLET | Freq: Every day | ORAL | Status: DC
  Administered 2017-08-12: 60 mg via ORAL
  Filled 2017-08-11: qty 3

## 2017-08-11 MED ORDER — ALBUTEROL SULFATE (2.5 MG/3ML) 0.083% IN NEBU: 2.5000 mg | INHALATION_SOLUTION | RESPIRATORY_TRACT | Status: DC | PRN

## 2017-08-11 MED ORDER — ALBUTEROL SULFATE (2.5 MG/3ML) 0.083% IN NEBU
2.5000 mg | INHALATION_SOLUTION | Freq: Four times a day (QID) | RESPIRATORY_TRACT | Status: DC
  Administered 2017-08-11: 2.5 mg via RESPIRATORY_TRACT
  Filled 2017-08-11: qty 3

## 2017-08-11 MED ORDER — ALBUTEROL SULFATE (2.5 MG/3ML) 0.083% IN NEBU: 2.5000 mg | INHALATION_SOLUTION | Freq: Four times a day (QID) | RESPIRATORY_TRACT | Status: DC

## 2017-08-11 NOTE — Progress Notes (Signed)
Contacted by primary team, patient with episode of increased SOB this AM after my intiail rounds O2 sats down to 80s. Improved with O2 by [place]. Given her hypoxia and cancer history concern for possible PE has increased, recommend CT PE for her. I will also contact our echo technician to perform her study this afternoon. Chest xray yesterday without significant pulmonary edema, benign EKG and flat troponin not consistent with ACS. Nebs per primary team and possible IV steroids for possible bronchospasm.   Carlyle Dolly MD

## 2017-08-11 NOTE — ED Notes (Signed)
Please Call Mechele Claude for report at 0100. Phone # [phone]. Thank you

## 2017-08-11 NOTE — Progress Notes (Signed)
Oncology Short Note  Called by primary team Kiara Brown regarding Kiara Brown. She is a Kiara Brown patient with Stage IB triple positive breast cancer s/p surgery who received  Her 1st dose of Herceptin on 08/09/2017 who per nursing note  "1st dose herceptin.  Approximately 2/3 of the way into infusion 1327 pt started exhibiting signs of rigors/lower back pain.  Infusion paused, NS ran wide open.  VSS.  Kiara. [name] notified.  1331 125mg  solumedrol administered 1332 pepcid IVPB started.  Kiara Massed, NP at bedside at this time.  Rigors worsening per pt, 25mg  demerol administered 1335.  Shortly after, rigors stopped, pt stated she was feeling better.  Per Kiara Brown, will wait approximately 10 minutes, if VSS will rechallenge remainder of herceptin at half previous rate.  1355 pt became diaphoretic, BP 81/39 (see vitals flowsheet), pt had one episode of emesis.  Pt remained hypotensive, c/o feeling faint.  NS restarted wide open.  Kiara. [name] notified. Decision made to hold remainder of dose.  Pt will return for next dose as scheduled.  Pt verbalized understanding.  Pt ambulated to and from restroom without problems.  VSS.  Ok to d/c per Kiara. [name] if pt feeling well, is ambulating without issues, and vss. "  Per chart review patient returned to the ED with exertional chest pain with mildly elevated troponin. She was admitted for r/o ACS. Admitted to [facility]. Per hospital patient is normotensive currently and no overt wheezing but has borderline hypoxia @ 90%  .BP (!) 189/70 (BP Location: Left Arm)   Pulse 73   Temp 97.9 F (36.6 C) (Oral)   Resp (!) 28   Ht 5\' 3"  (1.6 m)   Wt 263 lb 0.1 oz (119.3 kg)   SpO2 96%   BMI 46.59 kg/m   A- Chest pain with SOB and borderline hypoxia Plan -cardiology following to r/o ACS and to r/o Herceptin related cardiotoxicity/cardiomyopathy. -planning to get ECHO today. -CXR on admission NAI. -with CP and positive trops and hypoxia consider CTA Chest to r/o PE  and evaluate for potential developing lung toxicity from Herceptin (initial CXR NAI) -reasonable to consider steroids if concern for bronchospasm -will let Kiara Brown know to f/u tomorrow. -plz call if any oncology specific questions in the interim.  Sullivan Lone MD Kiara

## 2017-08-11 NOTE — Progress Notes (Signed)
  Echocardiogram 2D Echocardiogram has been performed.  Kiara Brown T Kiara Brown 08/11/2017, 12:41 PM

## 2017-08-11 NOTE — Consult Note (Signed)
Primary cardiologist: Dr Rowland Lathe Consulting cardiologist: Dr Carlyle Dolly  Requesting phsycian: Dr Niel Hummer Indication: elevated troponin  Clinical Summary Kiara Brown is a 76 y.o.female history of breast cancer treated with anastrozole and trastuzumab s/p right mastectomy followed in cardio-onc clinic by Dr Marigene Ehlers. Echo 05/2017 with LVEF 60-65% no WMAs, global longitudinal strain  -20%.  Presented with chest pain and SOB that started during initial herceptin infusion. Infusion was stopped due to reaction with marked hypotension, diaphoresis, and rigors. Documented vitals show bp's as low as 80s/50s. Symptoms resolved initially and patient was discharged home from infusion center, however shortly after getting home had onset of 4/10 midhcest pressure and SOB. Chest pressure constant well over 24 hours, worst with deep breathing.     ER vitals: p 69 bp 170/70 95% RA Cr 1.05, K 3.6, WBC 14.1, Hgb 9.5, Plt 269, BNP 126, lactic acid 1.56,  Trop 0.09-->0.09-->0.08 CXR no acute process EKG SR no ischemic changes Allergies  Allergen Reactions  . Duloxetine     UNSPECIFIED REACTION   . Nsaids Rash  . Oseltamivir Diarrhea and Other (See Comments)    Abdominal pain     Medications Scheduled Medications: . amitriptyline  20 mg Oral QHS  . aspirin EC  81 mg Oral Daily  . buPROPion  150 mg Oral QAC breakfast  . folic acid  1 mg Oral Daily  . heparin  5,000 Units Subcutaneous Q8H  . insulin aspart  0-5 Units Subcutaneous QHS  . insulin aspart  0-9 Units Subcutaneous TID WC  . metoprolol tartrate  25 mg Oral BID  . pantoprazole  40 mg Oral Daily  . potassium chloride  40 mEq Oral Once     Infusions:   PRN Medications:  acetaminophen, ALPRAZolam, cyclobenzaprine, lidocaine-prilocaine, morphine injection, nitroGLYCERIN, ondansetron (ZOFRAN) IV, sodium chloride flush   Past Medical History:  Diagnosis Date  . Arthritis    osteo, rheumatoid  . Breast cancer  ([place])   . Diverticulosis   . Dysrhythmia    hx rapid heartbeat, no cardiologist  . Family history of breast cancer   . Family history of prostate cancer   . Fibromyalgia    peripheral neuropathy  . Fibromyalgia   . GERD (gastroesophageal reflux disease)   . H/O hiatal hernia   . Hyperlipidemia   . PONV (postoperative nausea and vomiting)    "THROAT WITH EXTREME GNOIBBC"4888, ulnar nerve surgery  . Sciatica   . Shortness of breath dyspnea    with exertion  . UTI (lower urinary tract infection)     Past Surgical History:  Procedure Laterality Date  . ABDOMINAL HYSTERECTOMY    . APPENDECTOMY    . back injection      July 2018- x1  . BREAST SURGERY Left 1986   biopsy, Recent 05/2017 at [facility]- had biopsy on R breast- malignant   . CARDIAC CATHETERIZATION  2009   "no blockage per pt"  . CHOLECYSTECTOMY    . CYSTOSCOPY WITH BIOPSY    . INCONTINENCE SURGERY    . JOINT REPLACEMENT    . KNEE ARTHROSCOPY     bilateral  . KNEE ARTHROSCOPY Left 03/17/2014   Procedure: ARTHROSCOPY LEFT KNEE WITH SYNOVECTOMY;  Surgeon: Gearlean Alf, MD;  [facility];  Service: Orthopedics;  Laterality: Left;  . PORTACATH PLACEMENT Left 07/02/2017   Procedure: INSERTION PORT-A-CATH;  Surgeon: Excell Seltzer, MD;  Location: [place];  Service: General;  Laterality: Left;  . ROTATOR CUFF REPAIR  right  . SIMPLE MASTECTOMY WITH AXILLARY SENTINEL NODE BIOPSY Right 07/02/2017   Procedure: RIGHT TOTAL  MASTECTOMY WITH AXILLARY SENTINEL LYMPH  NODE BIOPSY;  Surgeon: Excell Seltzer, MD;  Location: [place];  Service: General;  Laterality: Right;  . TOTAL KNEE ARTHROPLASTY  08/04/2012   Procedure: TOTAL KNEE ARTHROPLASTY;  Surgeon: Gearlean Alf, MD;  [facility];  Service: Orthopedics;  Laterality: Left;  . TOTAL KNEE ARTHROPLASTY Right 04/11/2015   Procedure: RIGHT TOTAL KNEE ARTHROPLASTY;  Surgeon: Gaynelle Arabian, MD;  [facility];  Service: Orthopedics;  Laterality: Right;  . ulnar nerve  surgery Right 1986    Family History  Problem Relation Age of Onset  . Hypertension Mother   . Stroke Mother   . Breast cancer Mother 50       died at 74  . Hypertension Father   . Heart attack Father        died at 22  . Hypertension Brother        MI  . Prostate cancer Brother 37  . Hypertension Brother        MI  . Lung cancer Brother        possible lung cancer, spot found on lung being monitored  . Hypertension Brother        MI  . Non-Hodgkin's lymphoma Sister 4  . Breast cancer Other 45       is currently 90  . Breast cancer Other 55  . Cancer Cousin 62       type of cancer unk.   . Cancer Other        type unknown, eye/face   . Cancer Other 49       died from cancer in his 80's, type unknown    Social History Kiara Brown reports that she has never smoked. She has never used smokeless tobacco. Kiara Brown reports that she does not drink alcohol.  Review of Systems CONSTITUTIONAL: No weight loss, fever, chills, weakness or fatigue.  HEENT: Eyes: No visual loss, blurred vision, double vision or yellow sclerae. No hearing loss, sneezing, congestion, runny nose or sore throat.  SKIN: No rash or itching.  CARDIOVASCULAR: per hpi RESPIRATORY: per hpi GASTROINTESTINAL: No anorexia, nausea, vomiting or diarrhea. No abdominal pain or blood.  GENITOURINARY: no polyuria, no dysuria NEUROLOGICAL: No headache, dizziness, syncope, paralysis, ataxia, numbness or tingling in the extremities. No change in bowel or bladder control.  MUSCULOSKELETAL: No muscle, back pain, joint pain or stiffness.  HEMATOLOGIC: No anemia, bleeding or bruising.  LYMPHATICS: No enlarged nodes. No history of splenectomy.  PSYCHIATRIC: No history of depression or anxiety.      Physical Examination Blood pressure (!) 152/67, pulse 73, temperature 97.9 F (36.6 C), temperature source Oral, resp. rate 18, height 5\' 3"  (1.6 m), weight 263 lb 0.1 oz (119.3 kg), SpO2 95 %.  Intake/Output Summary  (Last 24 hours) at 08/11/17 9371 Last data filed at 08/11/17 0517  Gross per 24 hour  Intake                0 ml  Output              100 ml  Net             -100 ml    HEENT: sclera clear, throat clear  Cardiovascular: RRR, no m/r/g, no jvd  Respiratory: CTAB  GI: abdomen soft, NT, ND  MSK: no LEe edema  Neuro: no focal deficits  Psych:  appropriate affect   Lab Results  Basic Metabolic Panel:  Recent Labs Lab 08/09/17 1014 08/10/17 1936 08/11/17 0338  NA 137 136 135  K 4.0 3.6 3.4*  CL  --  103 105  CO2 23 24 23   GLUCOSE 148* 163* 150*  BUN 14.7 18 18   CREATININE 1.0 1.05* 1.04*  CALCIUM 9.4 8.8* 8.5*    Liver Function Tests:  Recent Labs Lab 08/09/17 1014 08/10/17 1936  AST 17 43*  ALT 12 34  ALKPHOS 101 77  BILITOT 0.29 0.5  PROT 6.8 6.7  ALBUMIN 3.4* 3.4*    CBC:  Recent Labs Lab 08/09/17 1014 08/10/17 1936 08/11/17 0338  WBC 7.9 14.1* 9.7  NEUTROABS 4.7 10.7*  --   HGB 10.6* 9.5* 9.3*  HCT 32.9* 29.4* 28.8*  MCV 81.8 83.3 81.8  PLT 306 269 232    Cardiac Enzymes:  Recent Labs Lab 08/10/17 1936 08/10/17 2246 08/11/17 0338  TROPONINI 0.09* 0.09* 0.08*    BNP: Invalid input(s): POCBNP     Impression/Recommendations 1. Elevated troponin - onset of symptoms during first infusion of hereceptin. Severe systemic symptoms with hypotension, diaphoresis, rigors. Bp's as low as 80s/50s, infusion was stopped - upon presenting to ER was hypretensive bp 170s/70 - mild flat troponin peak 0.09 tredning down, benign ekg. Atypcal symptoms, chest pressure constant well over 24 hours worst with inspiration - echo is pending - symptoms likely related to severe systemic reaction to herceptin. From cardiac standpoint she had severe hypotenion as well as hypertension, increased demand due to severe systemic reaction to medication.  At this time do not suspect ACS. We will f/u echo results.   - presentation not overally consistent with PE,f/u  RV function on echo - no plans for ischemic testing at this time.    2. HTN - bp's trending down. She did receive IV steroids at infusion center. Potential side effect of herceptin as well - follow trends, can continue beta blocker. If persistent consider adding norvasc.     Carlyle Dolly, M.D.

## 2017-08-11 NOTE — Progress Notes (Signed)
PROGRESS NOTE    Kiara Brown  YDX:[id number] DOB: [birth year] DOA: 08/10/2017 PCP: Burman Freestone, MD    Brief Narrative:  Kiara Brown is a 76 y.o. female with medical history significant for hypertension, depression, and recent diagnosis of breast cancer status post right mastectomy, now presenting to the emergency department with exertional chest pressure and dyspnea. Patient was at the cancer center yesterday for her first Herceptin infusion, but the infusion had to be stopped due to a reaction marked by hypotension, diaphoresis, and rigors. Since returning home, patient has been experiencing a moderate, pressure-like, discomfort, localized to the left chest, worse with exertion, better with rest, and associated with exertional dyspnea. She had never experienced these symptoms previously. She also reports some mild associated nausea. She took her usual baby aspirin this morning. Denies cough, fever, or chills.   ED Course: Upon arrival to the ED, patient is found to be afebrile, saturating well on room air, and with vitals otherwise stable. EKG features normal sinus rhythm and chest x-ray is negative for edema or consolidation. Chemistry panel reveals a serum creatinine 1.05 which appears consistent with her baseline. CBC was notable for new leukocytosis to 14,100 and a stable normocytic anemia with hemoglobin of 9.5. Urinalysis features a low specific gravity. Lactic acid is reassuring at 1.56, INR is normal, BNP is mildly elevated 227, and troponin is elevated to 0.09. Patient was treated with Trovan and 43 mg of aspirin in the emergency department and blood cultures were collected. Cardiology was consulted by the ED physician and recommended a medical admission to [facility] where they will see the patient in consultation. Patient remained hemodynamically stable in the ED with no acute respiratory distress, but continues to experience left-sided chest pressure with an elevated  troponin concerning for possible ACS. She was to be admitted to the stepdown unit at [facility] for ongoing evaluation and management of this, but no bed was available, and no anticipated openings, and so patient will be admitted to [facility] for initial phase of care.   Assessment & Plan:   Principal Problem:   Chest pain Active Problems:   Malignant neoplasm of upper-inner quadrant of right breast in female, estrogen receptor positive (HCC)   Anemia, normocytic normochromic   Essential hypertension   CKD (chronic kidney disease), stage II   Type II diabetes mellitus ([place])   1-Acute hypoxic Respiratory Failure. Rule out cardiotoxicity , lung toxicity.  Oxygen drop to 88 on RA> she is complaining of dyspnea.  Discussed with cardiologist and oncologist, ECHO, CTA angio rule out PE.  I will start Pepcid, IV solumedrol to cover for pneumonitis, follow CT angio.  Schedule nebulizer treatments and Pulmicort.  Treat HTN.   2-Hypokalemia; replete orally.   3- Breast cancer  - Status-post right mastectomy  - Had first Herceptin infusion 9-14, but was stopped d/t adverse reaction  - Hold Arimidex while evaluating possible ACS    3-HTN; start Norvasc. IV PRN Hydralazine ordered.   4-Type II DM   - A1c was 8% last month  - Has been diet-controlled  - Check CBG with meals and qHS, SSI  6. CKD stage II  - SCr is 1.05 on admission, consistent with her apparent baseline  - IV fluid, in setting of CT angio    DVT prophylaxis: Heparin  Code Status: full code.  Family Communication: care discussed with patient.  Disposition Plan: remain in the hospital.    Consultants:   Oncologist  Cardiologist  Procedures: ECHO   Antimicrobials: none   Subjective: She is having SOB, just from talking or repositioning in the bed.    Objective: Vitals:   08/11/17 1052 08/11/17 1054 08/11/17 1118 08/11/17 1151  BP:  (!) 189/70  (!) 177/67  Pulse:  73    Resp:  (!) 28    Temp:       TempSrc:      SpO2: (!) 88% 97% 96%   Weight:      Height:        Intake/Output Summary (Last 24 hours) at 08/11/17 1401 Last data filed at 08/11/17 1050  Gross per 24 hour  Intake                0 ml  Output              750 ml  Net             -750 ml   Filed Weights   08/10/17 2133 08/11/17 0200 08/11/17 0455  Weight: 120.2 kg (265 lb) 119 kg (262 lb 5.6 oz) 119.3 kg (263 lb 0.1 oz)    Examination:  General exam: Appears calm and comfortable  Respiratory system: Mild tachypnea, bilateral air movement, no wheezing.  Cardiovascular system: S1 & S2 heard, RRR. No JVD, murmurs, rubs, gallops or clicks. No pedal edema. Gastrointestinal system: Abdomen is nondistended, soft and nontender. No organomegaly or masses felt. Normal bowel sounds heard. Central nervous system: Alert and oriented. No focal neurological deficits. Extremities: Symmetric 5 x 5 power. Skin: No rashes, lesions or ulcers Psychiatry: Judgement and insight appear normal. Mood & affect appropriate.     Data Reviewed: I have personally reviewed following labs and imaging studies  CBC:  Recent Labs Lab 08/09/17 1014 08/10/17 1936 08/11/17 0338  WBC 7.9 14.1* 9.7  NEUTROABS 4.7 10.7*  --   HGB 10.6* 9.5* 9.3*  HCT 32.9* 29.4* 28.8*  MCV 81.8 83.3 81.8  PLT 306 269 174   Basic Metabolic Panel:  Recent Labs Lab 08/09/17 1014 08/10/17 1936 08/11/17 0338  NA 137 136 135  K 4.0 3.6 3.4*  CL  --  103 105  CO2 23 24 23   GLUCOSE 148* 163* 150*  BUN 14.7 18 18   CREATININE 1.0 1.05* 1.04*  CALCIUM 9.4 8.8* 8.5*   GFR: Estimated Creatinine Clearance: 57.5 mL/min (A) (by C-G formula based on SCr of 1.04 mg/dL (H)). Liver Function Tests:  Recent Labs Lab 08/09/17 1014 08/10/17 1936  AST 17 43*  ALT 12 34  ALKPHOS 101 77  BILITOT 0.29 0.5  PROT 6.8 6.7  ALBUMIN 3.4* 3.4*   No results for input(s): LIPASE, AMYLASE in the last 168 hours. No results for input(s): AMMONIA in the last 168  hours. Coagulation Profile:  Recent Labs Lab 08/10/17 1936  INR 1.00   Cardiac Enzymes:  Recent Labs Lab 08/10/17 1936 08/10/17 2246 08/11/17 0338 08/11/17 1014  TROPONINI 0.09* 0.09* 0.08* 0.08*   BNP (last 3 results) No results for input(s): PROBNP in the last 8760 hours. HbA1C: No results for input(s): HGBA1C in the last 72 hours. CBG:  Recent Labs Lab 08/11/17 0216 08/11/17 0744 08/11/17 1136  GLUCAP 145* 136* 149*   Lipid Profile:  Recent Labs  08/11/17 0338  CHOL 153  HDL 38*  LDLCALC 84  TRIG 153*  CHOLHDL 4.0   Thyroid Function Tests: No results for input(s): TSH, T4TOTAL, FREET4, T3FREE, THYROIDAB in the last 72 hours. Anemia Panel: No results for  input(s): VITAMINB12, FOLATE, FERRITIN, TIBC, IRON, RETICCTPCT in the last 72 hours. Sepsis Labs:  Recent Labs Lab 08/10/17 1948  LATICACIDVEN 1.56    No results found for this or any previous visit (from the past 240 hour(s)).       Radiology Studies: Dg Chest 2 View  Result Date: 08/10/2017 CLINICAL DATA:  Breast carcinoma.  Shortness of breath.  Chills. EXAM: CHEST  2 VIEW COMPARISON:  July 02, 2017 FINDINGS: There is no edema or consolidation. Heart size and pulmonary vascularity are normal. No adenopathy. Port-A-Cath tip is in the superior vena cava. No pneumothorax. No evident bone lesions. Patient is status post right mastectomy. IMPRESSION: No edema or consolidation. Electronically Signed   By: Lowella Grip III M.D.   On: 08/10/2017 19:15        Scheduled Meds: . albuterol  2.5 mg Nebulization BID  . amitriptyline  20 mg Oral QHS  . amLODipine  5 mg Oral Daily  . aspirin EC  81 mg Oral Daily  . budesonide (PULMICORT) nebulizer solution  0.25 mg Nebulization BID  . buPROPion  150 mg Oral QAC breakfast  . folic acid  1 mg Oral Daily  . heparin  5,000 Units Subcutaneous Q8H  . insulin aspart  0-5 Units Subcutaneous QHS  . insulin aspart  0-9 Units Subcutaneous TID WC  .  methylPREDNISolone (SOLU-MEDROL) injection  40 mg Intravenous Q12H  . metoprolol tartrate  25 mg Oral BID  . pantoprazole  40 mg Oral Daily   Continuous Infusions: . sodium chloride 50 mL/hr at 08/11/17 1152     LOS: 1 day    Time spent: 35 minutes.     Elmarie Shiley, MD [facility] Pager [phone]  If 7PM-7AM, please contact night-coverage [email or web address] Password [email or web address] 08/11/2017, 2:01 PM

## 2017-08-12 ENCOUNTER — Other Ambulatory Visit: Payer: Self-pay

## 2017-08-12 ENCOUNTER — Telehealth (HOSPITAL_COMMUNITY): Payer: Self-pay | Admitting: Vascular Surgery

## 2017-08-12 LAB — CBC
HEMATOCRIT: 28.4 % — AB (ref 36.0–46.0)
HEMOGLOBIN: 9.3 g/dL — AB (ref 12.0–15.0)
MCH: 27.1 pg (ref 26.0–34.0)
MCHC: 32.7 g/dL (ref 30.0–36.0)
MCV: 82.8 fL (ref 78.0–100.0)
Platelets: 268 10*3/uL (ref 150–400)
RBC: 3.43 MIL/uL — AB (ref 3.87–5.11)
RDW: 16 % — ABNORMAL HIGH (ref 11.5–15.5)
WBC: 8.9 10*3/uL (ref 4.0–10.5)

## 2017-08-12 LAB — BASIC METABOLIC PANEL
Anion gap: 8 (ref 5–15)
BUN: 18 mg/dL (ref 6–20)
CHLORIDE: 102 mmol/L (ref 101–111)
CO2: 25 mmol/L (ref 22–32)
Calcium: 8.9 mg/dL (ref 8.9–10.3)
Creatinine, Ser: 1.05 mg/dL — ABNORMAL HIGH (ref 0.44–1.00)
GFR calc non Af Amer: 50 mL/min — ABNORMAL LOW (ref >60)
GFR, EST AFRICAN AMERICAN: 58 mL/min — AB (ref >60)
Glucose, Bld: 163 mg/dL — ABNORMAL HIGH (ref 65–99)
POTASSIUM: 4.3 mmol/L (ref 3.5–5.1)
SODIUM: 135 mmol/L (ref 135–145)

## 2017-08-12 LAB — GLUCOSE, CAPILLARY
GLUCOSE-CAPILLARY: 144 mg/dL — AB (ref 65–99)
GLUCOSE-CAPILLARY: 203 mg/dL — AB (ref 65–99)

## 2017-08-12 MED ORDER — FAMOTIDINE 20 MG PO TABS: 20.0000 mg | ORAL_TABLET | Freq: Two times a day (BID) | ORAL | 0 refills | Status: DC

## 2017-08-12 MED ORDER — FAMOTIDINE 20 MG PO TABS
20.0000 mg | ORAL_TABLET | Freq: Two times a day (BID) | ORAL | Status: DC
  Administered 2017-08-12: 20 mg via ORAL
  Filled 2017-08-12: qty 1

## 2017-08-12 MED ORDER — FUROSEMIDE 20 MG PO TABS: 20.0000 mg | ORAL_TABLET | Freq: Every day | ORAL | 11 refills | Status: DC

## 2017-08-12 MED ORDER — HEPARIN SOD (PORK) LOCK FLUSH 100 UNIT/ML IV SOLN
500.0000 [IU] | INTRAVENOUS | Status: AC | PRN
  Administered 2017-08-12: 250 [IU]

## 2017-08-12 MED ORDER — BUDESONIDE 0.25 MG/2ML IN SUSP: 0.2500 mg | Freq: Two times a day (BID) | RESPIRATORY_TRACT | 12 refills | Status: DC

## 2017-08-12 MED ORDER — POTASSIUM CHLORIDE ER 20 MEQ PO TBCR: 20.0000 mg | EXTENDED_RELEASE_TABLET | Freq: Every day | ORAL | 0 refills | Status: DC

## 2017-08-12 MED ORDER — ALBUTEROL SULFATE HFA 108 (90 BASE) MCG/ACT IN AERS: 2.0000 | INHALATION_SPRAY | Freq: Four times a day (QID) | RESPIRATORY_TRACT | 2 refills | Status: DC | PRN

## 2017-08-12 MED ORDER — PREDNISONE 20 MG PO TABS: ORAL_TABLET | ORAL | 0 refills | Status: DC

## 2017-08-12 NOTE — Progress Notes (Signed)
Kiara Brown   DOB:05/07/1941   IT#:[id number]   RAX#:[id number]   Interval history: Kiara Brown had her first dose of trastuzumab 08/09/2017. She had a reaction at the time of treatment. There was no stridor or shortness of breath. There was some hypotension. She had recovered with stopping the infusion and some fluids and by the time she went home she was asymptomatic.--However shortly thereafter she started having shortness of breath and some chest pain. She waited approximately one day to make sure this did not resolve on its own, and as it didn't she presented to the emergency room where she was found to have an elevated troponin. Chest x-ray was clear but this CT and your, which showed no evidence of a clot, did show interstitial changes at the bases consistent with possible failure, and mild bilateral effusions. Her troponin was elevated and continues at 0.08 (baseline 0.09). A repeat echocardiogram was obtained yesterday which shows a well-preserved ejection fraction and no evidence of failure  Subjective:  Kiara Brown feels much better. She has received some steroids and inhaler treatments. She has had a little bit of a cough, which is nonproductive. Her blood pressure has if anything been on the high side and she has had no fever. She denies pleurisy. A detailed review of systems today was otherwise stable. No family in room   Objective:  Older white woman examined sitting at bedside Vitals:   08/12/17 0433 08/12/17 0824  BP: (!) 146/56   Pulse: 63   Resp: 18   Temp: (!) 97.4 F (36.3 C)   SpO2: 96% 95%    Body mass index is 45.73 kg/m.  Intake/Output Summary (Last 24 hours) at 08/12/17 0950 Last data filed at 08/12/17 0020  Gross per 24 hour  Intake             1300 ml  Output             3950 ml  Net            -2650 ml     Sclerae unicteric  Lungs faint biibasilar crackles  Heart regular rate and rhythm  Abdomen soft, +BS  Neuro nonfocal  Breast exam: Deferred  CBG (last 3)    Recent Labs  08/11/17 1718 08/11/17 2124 08/12/17 0745  GLUCAP 191* 301* 144*     Labs:  Lab Results  Component Value Date   WBC 8.9 08/12/2017   HGB 9.3 (L) 08/12/2017   HCT 28.4 (L) 08/12/2017   MCV 82.8 08/12/2017   PLT 268 08/12/2017   NEUTROABS 10.7 (H) 08/10/2017    '@LASTCHEMISTRY'$ @  Urine Studies No results for input(s): UHGB, CRYS in the last 72 hours.  Invalid input(s): UACOL, UAPR, USPG, UPH, UTP, UGL, UKET, UBIL, UNIT, UROB, ULEU, UEPI, UWBC, URBC, UBAC, CAST, [place], [place]  Basic Metabolic Panel:  Recent Labs Lab 08/09/17 1014  08/10/17 1936 08/11/17 0338 08/12/17 0518  NA 137  --  136 135 135  K 4.0  < > 3.6 3.4* 4.3  CL  --   --  103 105 102  CO2 23  --  '24 23 25  '$ GLUCOSE 148*  --  163* 150* 163*  BUN 14.7  --  '18 18 18  '$ CREATININE 1.0  --  1.05* 1.04* 1.05*  CALCIUM 9.4  --  8.8* 8.5* 8.9  < > = values in this interval not displayed. GFR Estimated Creatinine Clearance: 56.3 mL/min (A) (by C-G formula based on SCr of 1.05 mg/dL (H)). Liver  Function Tests:  Recent Labs Lab 08/09/17 1014 08/10/17 1936  AST 17 43*  ALT 12 34  ALKPHOS 101 77  BILITOT 0.29 0.5  PROT 6.8 6.7  ALBUMIN 3.4* 3.4*   No results for input(s): LIPASE, AMYLASE in the last 168 hours. No results for input(s): AMMONIA in the last 168 hours. Coagulation profile  Recent Labs Lab 08/10/17 1936  INR 1.00    CBC:  Recent Labs Lab 08/09/17 1014 08/10/17 1936 08/11/17 0338 08/12/17 0518  WBC 7.9 14.1* 9.7 8.9  NEUTROABS 4.7 10.7*  --   --   HGB 10.6* 9.5* 9.3* 9.3*  HCT 32.9* 29.4* 28.8* 28.4*  MCV 81.8 83.3 81.8 82.8  PLT 306 269 232 268   Cardiac Enzymes:  Recent Labs Lab 08/10/17 1936 08/10/17 2246 08/11/17 0338 08/11/17 1014 08/11/17 1538  TROPONINI 0.09* 0.09* 0.08* 0.08* 0.08*   BNP: Invalid input(s): POCBNP CBG:  Recent Labs Lab 08/11/17 0744 08/11/17 1136 08/11/17 1718 08/11/17 2124 08/12/17 0745  GLUCAP 136* 149* 191* 301* 144*    D-Dimer No results for input(s): DDIMER in the last 72 hours. Hgb A1c No results for input(s): HGBA1C in the last 72 hours. Lipid Profile  Recent Labs  08/11/17 0338  CHOL 153  HDL 38*  LDLCALC 84  TRIG 153*  CHOLHDL 4.0   Thyroid function studies No results for input(s): TSH, T4TOTAL, T3FREE, THYROIDAB in the last 72 hours.  Invalid input(s): FREET3 Anemia work up No results for input(s): VITAMINB12, FOLATE, FERRITIN, TIBC, IRON, RETICCTPCT in the last 72 hours. Microbiology No results found for this or any previous visit (from the past 240 hour(s)).    Studies:  Dg Chest 2 View  Result Date: 08/10/2017 CLINICAL DATA:  Breast carcinoma.  Shortness of breath.  Chills. EXAM: CHEST  2 VIEW COMPARISON:  July 02, 2017 FINDINGS: There is no edema or consolidation. Heart size and pulmonary vascularity are normal. No adenopathy. Port-A-Cath tip is in the superior vena cava. No pneumothorax. No evident bone lesions. Patient is status post right mastectomy. IMPRESSION: No edema or consolidation. Electronically Signed   By: Lowella Grip III M.D.   On: 08/10/2017 19:15   Ct Angio Chest Pe W Or Wo Contrast  Result Date: 08/11/2017 CLINICAL DATA:  76 year old female with hypoxemia and respiratory failure. History of breast cancer and right mastectomy on 07/02/2017, currently undergoing treatment. EXAM: CT ANGIOGRAPHY CHEST WITH CONTRAST TECHNIQUE: Multidetector CT imaging of the chest was performed using the standard protocol during bolus administration of intravenous contrast. Multiplanar CT image reconstructions and MIPs were obtained to evaluate the vascular anatomy. CONTRAST:  100 cc intravenous Isovue 370 COMPARISON:  08/10/2017 and prior radiographs.  10/13/2013 CT FINDINGS: Cardiovascular: This is a technically adequate study although respiratory motion artifact, particularly in the lower lungs, decreases sensitivity. No pulmonary emboli are identified. Upper limits normal heart  size noted. LAD coronary artery calcifications present. No pericardial effusion or thoracic aortic aneurysm. A left IJ Port-A-Cath is noted with tip in the lower SVC. Mediastinum/Nodes: No enlarged mediastinal, hilar, or axillary lymph nodes. Thyroid gland, trachea, and esophagus demonstrate no significant findings. Lungs/Pleura: Interlobular septal thickening identified bilaterally and may represent mild edema. Trace bilateral pleural effusions and mild dependent/bibasilar atelectasis noted. No definite airspace disease or mass noted. There is no evidence of pneumothorax. Upper Abdomen: No acute abnormality Musculoskeletal: Right mastectomy and axillary surgical changes noted. No acute or suspicious bony abnormality. Review of the MIP images confirms the above findings. IMPRESSION: 1. No evidence of  pulmonary emboli 2. Bilateral interstitial opacities suggesting interstitial pulmonary edema. 3. Trace bilateral pleural effusions and mild dependent/basilar atelectasis. 4. Coronary artery disease Electronically Signed   By: Margarette Canada M.D.   On: 08/11/2017 13:45    Assessment: 76 y.o. Kiara Brown woman status post right breast upper inner quadrant biopsy 2 on 05/27/2017 showing a clinical T1c N0, stage IA invasive ductal carcinoma, grade 3, estrogen and progesterone receptor positive, HER-2 amplified, with an MIB-1 of 60%  (1) genetics testing 07/14/2017 through the Multi-Cancer panel + Breast Cancer Panel found no deleterious mutations in ALK, APC,AKT1, ATM, AXIN2,BAP1, BARD1, BLM, BMPR1A, BRCA1, BRCA2, BRIP1, CASR, CDC73, CDH1, CDK4, CDKN1B, CDKN1C, CDKN2A (p14ARF), CDKN2A (p16INK4a), CEBPA, CHEK2, CTNNA1, DICER1, DIS3L2, EGFR (c.2369C>T, p.Thr790Met variant only), EPCAM (Deletion/duplication testing only), FH, FAM175A, FLCN, GATA2, GPC3, GREM1 (Promoter region deletion/duplication testing only), HOXB13 (c.251G>A, p.Gly84Glu), HRAS, KIT, MAX, MEN1, MET, MITF (c.952G>A, p.Glu318Lys variant only), MLH1, MSH2,  MSH3, MSH6, MUTYH, NBN, NF1, NF2, NTHL1, PALB2, PDGFRA, PHOX2B, PMS2, POLD1, POLE, POT1, PRKAR1A, PTCH1, PTEN, RAD50, RAD51C, RAD51D, RB1, RECQL4, RET, RUNX1, SDHAF2, SDHA (sequence changes only), SDHB, SDHC, SDHD, SMAD4, SMARCA4, SMARCB1, SMARCE1, STK11, SUFU, TERC, TERT, TMEM127, TP53, TSC1, TSC2, VHL, WRN, WT1, FANCC,MRE11,PIK3CA,RINT1,XRCC2.             (a) 3 Variants of Uncertain significance (VUS) were identified. A Variant of uncertain significance (VUS) in BRCA1 c.2551G>A (p.Glu851Lys). One VUS was found in CHEK2 c.679G>A (p.Gly227Arg). 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 Another VUS was identified in [place] c.704T>C (p.Ile235Thr). The date of this test report is 07/14/2017.    (2) anastrozole started 06/05/2017, to be continued and minimum of 5 years   (3) status post right mastectomy and sentinel lymph node sampling 07/02/2017 for an mpT2 pN0, stage IB invasive ductal carcinoma, grade 3, with negative margins.             (a) the patient opted against reconstruction  (4) trastuzumab planned for 6 months, started 08/09/2017             (a) baseline echocardiogram 06/19/2017 shows an excellent ejection fraction  (b) significant reaction to first dose of trastuzumab  (c) repeat echocardiogram 08/11/2017 shows a normal ejection fraction    Plan:  Kiara Brown had a significant reaction to her first dose of trastuzumab. Her treatment was interrupted so she received approximately 600 mg. After I saw her in the infusion room she was already feeling better. However she had significant chest discomfort and shortness of breath, which has been evaluated as noted above. Today she is feeling much better and hopes to be able to go home  At this point I am not comfortable proceeding with trastuzumab as planned. We certainly will continue on anastrozole. There are other anti-HER-2 treatments we could consider including lapatinib, though they have their own side effects. At this  point I think the best thing to do is wait, make sure she completely recovers from this episode, and then reassess  Accordingly I am canceling her appointment here October 5. She will return to see me October 12 at 11:30 AM.  She knows to call for any other issues that may develop before that visit.   Chauncey Cruel, MD 08/12/2017  9:50 AM Medical Oncology and Hematology [facility] [address] [place], [place] [address] Tel. [phone]    Fax. [phone]

## 2017-08-12 NOTE — Progress Notes (Signed)
PHARMACIST - PHYSICIAN COMMUNICATION  DR:  Tyrell Antonio  CONCERNING: IV to Oral Route Change Policy  RECOMMENDATION: This patient is receiving pepcid by the intravenous route.  Based on criteria approved by the Pharmacy and Therapeutics Committee, the intravenous medication(s) is/are being converted to the equivalent oral dose form(s).   DESCRIPTION: These criteria include:  The patient is eating (either orally or via tube) and/or has been taking other orally administered medications for a least 24 hours  The patient has no evidence of active gastrointestinal bleeding or impaired GI absorption (gastrectomy, short bowel, patient on TNA or NPO).  If you have questions about this conversion, please contact the Pharmacy Department  []   [phone] )  Kiara Brown []   [phone] )  [facility] []   [phone] )  Kiara Brown []   [phone] )  [facility] [x]   [phone] )  [place] T, [facility] 08/12/2017 10:14 AM

## 2017-08-12 NOTE — Progress Notes (Signed)
SATURATION QUALIFICATIONS: (This note is used to comply with regulatory documentation for home oxygen)  Patient Saturations on Room Air at Rest = 95%  Patient Saturations on Room Air while Ambulating = 95%  Patient Saturations on 0 Liters of oxygen while Ambulating = %  Please briefly explain why patient needs home oxygen:

## 2017-08-12 NOTE — Care Management Note (Signed)
Case Management Note  Patient Details  Name: Kiara Brown MRN: [id number] Date of Birth: 1941/08/21  Subjective/Objective:   76 y/o f admitted w/Chest pain/elevated trop. From home.Cardio following. )2 sats does not qualify for home 02. No CM needs.                 Action/Plan:d/c home.   Expected Discharge Date:                  Expected Discharge Plan:  Home/Self Care  In-House Referral:     Discharge planning Services  CM Consult  Post Acute Care Choice:    Choice offered to:     DME Arranged:    DME Agency:     HH Arranged:    HH Agency:     Status of Service:  In process, will continue to follow  If discussed at Long Length of Stay Meetings, dates discussed:    Additional Comments:  Dessa Phi, RN 08/12/2017, 1:08 PM

## 2017-08-12 NOTE — Discharge Summary (Addendum)
Physician Discharge Summary  Kiara Brown YIF:[id number] DOB: December 07, 1940 DOA: 08/10/2017  PCP: Burman Freestone, MD  Admit date: 08/10/2017 Discharge date: 08/12/2017  Admitted From: Home  Disposition: home   Recommendations for Outpatient Follow-up:  1. Follow up with PCP in 1-2 weeks to follow up on resolution of pneumonitis.  2. Please obtain BMP/CBC in one week    Discharge Condition: Stable.  CODE STATUS: Full code.  Diet recommendation: Heart Healthy / Carb Modified / Regular / Dysphagia   Brief/Interim Summary: : Kiara Brown a 76 y.o.femalewith medical history significant for hypertension, depression, and recent diagnosis of breast cancer status post right mastectomy, now presenting to the emergency department with exertional chest pressure and dyspnea. Patient was at the cancer center yesterday for her first Herceptin infusion, but the infusion had to be stopped due to a reaction marked by hypotension, diaphoresis, and rigors. Since returning home, patient has been experiencing a moderate, pressure-like, discomfort, localized to the left chest, worse with exertion, better with rest, and associated with exertional dyspnea. She had never experienced these symptoms previously. She also reports some mild associated nausea. She took her usual baby aspirin this morning. Denies cough, fever, or chills.   ED Course:Upon arrival to the ED, patient is found to be afebrile, saturating well on room air, and with vitals otherwise stable. EKG features normal sinus rhythm and chest x-ray is negative for edema or consolidation. Chemistry panel reveals a serum creatinine 1.05 which appears consistent with her baseline. CBC was notable for new leukocytosis to 14,100 and a stable normocytic anemia with hemoglobin of 9.5. Urinalysis features a low specific gravity. Lactic acid is reassuring at 1.56, INR is normal, BNP is mildly elevated 227, and troponin is elevated to 0.09. Patient was  treated with Trovan and 43 mg of aspirin in the emergency department and blood cultures were collected. Cardiology was consulted by the ED physician and recommended a medical admission to [facility] where they will see the patient in consultation. Patient remained hemodynamically stable in the ED with no acute respiratory distress, but continues to experience left-sided chest pressure with an elevated troponin concerning for possible ACS. She was tobe admitted to the stepdown unit at [facility] for ongoing evaluation and management of this, but no bed was available, and no anticipated openings, and so patient will be admitted to [facility] for initial phase of care.  1-Acute hypoxic Respiratory Failure. Related to Pneumonitis.  Oxygen drop to 88 on RA> she is complaining of dyspnea. Oxygen sat on RA normal.  Treated with Pepcid, IV solumedrol to cover for pneumonitis.  Treated with Schedule nebulizer treatments and Pulmicort/  Patient will be discharge on prednisone taper course of 5 days. Also lasix  Due to possible pulmonary edema. She will also be discharge on PRN albuterol/  Chest pain, dyspnea; adverse effect from herceptin.   2-Hypokalemia; replete orally.   3-Breast cancer  - Status-post right mastectomy  - Had first Herceptin infusion 9-14, but was stopped d/t adverse reaction  - Resume  ArimidexCS   3-HTN; received Norvasc in the hospital, but she will be started on lasix. Will discontinue Norvasc and continue with atenolol/   4-Type II DM  - A1c was 8% last month  - Has been diet-controlled  - Check CBG with meals and qHS, SSI  6. CKD stage II  - SCr is 1.05 on admission, consistent with her apparent baseline  - IV fluid, in setting of CT angio  7-Chronic  Diastolic dysfunction;  Start low dose lasix. Potasium prescribe.  Needs to follow up with Dr Trey Paula.    Discharge Diagnoses:    Pneumonitis.    Chest pain Active Problems:   Malignant neoplasm of  upper-inner quadrant of right breast in female, estrogen receptor positive (HCC)   Anemia, normocytic normochromic   Essential hypertension   CKD (chronic kidney disease), stage II   Type II diabetes mellitus ([place])    Discharge Instructions  Discharge Instructions    Diet - low sodium heart healthy    Complete by:  As directed    Increase activity slowly    Complete by:  As directed      Allergies as of 08/12/2017      Reactions   Duloxetine    UNSPECIFIED REACTION    Nsaids Rash   Oseltamivir Diarrhea, Other (See Comments)   Abdominal pain      Medication List    STOP taking these medications   ORENCIA IV   oxyCODONE 5 MG immediate release tablet Commonly known as:  Oxy IR/ROXICODONE     TAKE these medications   acetaminophen 500 MG tablet Commonly known as:  TYLENOL Take 500 mg by mouth every 6 (six) hours as needed (Pain). Pain   albuterol 108 (90 Base) MCG/ACT inhaler Commonly known as:  PROVENTIL HFA;VENTOLIN HFA Inhale 2 puffs into the lungs every 6 (six) hours as needed for wheezing or shortness of breath.   amitriptyline 10 MG tablet Commonly known as:  ELAVIL Take 20 mg by mouth at bedtime.   anastrozole 1 MG tablet Commonly known as:  ARIMIDEX Take 1 tablet (1 mg total) by mouth daily.   aspirin EC 81 MG tablet Take 81 mg by mouth daily.   atenolol 50 MG tablet Commonly known as:  TENORMIN Take 50 mg by mouth daily before breakfast.   buPROPion 150 MG 24 hr tablet Commonly known as:  WELLBUTRIN XL Take 150 mg by mouth daily before breakfast.   cyclobenzaprine 5 MG tablet Commonly known as:  FLEXERIL Take 5 mg by mouth at bedtime as needed for muscle spasms.   folic acid 1 MG tablet Commonly known as:  FOLVITE Take 1 mg by mouth daily.   furosemide 20 MG tablet Commonly known as:  LASIX Take 1 tablet (20 mg total) by mouth daily.   lidocaine-prilocaine cream Commonly known as:  EMLA Apply 1 application topically as needed.    omeprazole 40 MG capsule Commonly known as:  PRILOSEC Take 40 mg by mouth every morning.   Potassium Chloride ER 20 MEQ Tbcr Take 20 mg by mouth daily at 2 PM.   predniSONE 20 MG tablet Commonly known as:  DELTASONE Take 3 tablets for 2 days, them 2 tablets for 3 days, then stop.            Discharge Care Instructions        Start     Ordered   08/12/17 0000  predniSONE (DELTASONE) 20 MG tablet     08/12/17 1335   08/12/17 0000  albuterol (PROVENTIL HFA;VENTOLIN HFA) 108 (90 Base) MCG/ACT inhaler  Every 6 hours PRN     08/12/17 1335   08/12/17 0000  furosemide (LASIX) 20 MG tablet  Daily     08/12/17 1335   08/12/17 0000  Potassium Chloride ER 20 MEQ TBCR  Daily     08/12/17 1335   08/12/17 0000  Increase activity slowly     08/12/17 1335   08/12/17 0000  Diet - low sodium heart healthy     08/12/17 1335     Follow-up Information    Burman Freestone, MD Follow up in 1 week(s).   Specialty:  Internal Medicine Contact information: [address] [place] [address] [phone]        Larey Dresser, MD Follow up in 2 week(s).   Specialty:  Cardiology Contact information: [address] [place] [address] [phone]          Allergies  Allergen Reactions  . Duloxetine     UNSPECIFIED REACTION   . Nsaids Rash  . Oseltamivir Diarrhea and Other (See Comments)    Abdominal pain     Consultations:  Cardiology  Oncology    Procedures/Studies: Dg Chest 2 View  Result Date: 08/10/2017 CLINICAL DATA:  Breast carcinoma.  Shortness of breath.  Chills. EXAM: CHEST  2 VIEW COMPARISON:  July 02, 2017 FINDINGS: There is no edema or consolidation. Heart size and pulmonary vascularity are normal. No adenopathy. Port-A-Cath tip is in the superior vena cava. No pneumothorax. No evident bone lesions. Patient is status post right mastectomy. IMPRESSION: No edema or consolidation. Electronically Signed   By: Lowella Grip III M.D.   On: 08/10/2017 19:15   Ct Angio Chest Pe W Or Wo Contrast  Result Date: 08/11/2017 CLINICAL DATA:  76 year old female with hypoxemia and respiratory failure. History of breast cancer and right mastectomy on 07/02/2017, currently undergoing treatment. EXAM: CT ANGIOGRAPHY CHEST WITH CONTRAST TECHNIQUE: Multidetector CT imaging of the chest was performed using the standard protocol during bolus administration of intravenous contrast. Multiplanar CT image reconstructions and MIPs were obtained to evaluate the vascular anatomy. CONTRAST:  100 cc intravenous Isovue 370 COMPARISON:  08/10/2017 and prior radiographs.  10/13/2013 CT FINDINGS: Cardiovascular: This is a technically adequate study although respiratory motion artifact, particularly in the lower lungs, decreases sensitivity. No pulmonary emboli are identified. Upper limits normal heart size noted. LAD coronary artery calcifications present. No pericardial effusion or thoracic aortic aneurysm. A left IJ Port-A-Cath is noted with tip in the lower SVC. Mediastinum/Nodes: No enlarged mediastinal, hilar, or axillary lymph nodes. Thyroid gland, trachea, and esophagus demonstrate no significant findings. Lungs/Pleura: Interlobular septal thickening identified bilaterally and may represent mild edema. Trace bilateral pleural effusions and mild dependent/bibasilar atelectasis noted. No definite airspace disease or mass noted. There is no evidence of pneumothorax. Upper Abdomen: No acute abnormality Musculoskeletal: Right mastectomy and axillary surgical changes noted. No acute or suspicious bony abnormality. Review of the MIP images confirms the above findings. IMPRESSION: 1. No evidence of pulmonary emboli 2. Bilateral interstitial opacities suggesting interstitial pulmonary edema. 3. Trace bilateral pleural effusions and mild dependent/basilar atelectasis. 4. Coronary artery disease Electronically Signed   By: Margarette Canada M.D.   On: 08/11/2017  13:45   (Echo, Carotid, EGD, Colonoscopy, ERCP)    Subjective:   Discharge Exam: Vitals:   08/12/17 0824 08/12/17 1101  BP:  (!) 145/71  Pulse:  70  Resp:    Temp:    SpO2: 95% 95%   Vitals:   08/11/17 2114 08/12/17 0433 08/12/17 0824 08/12/17 1101  BP: (!) 144/71 (!) 146/56  (!) 145/71  Pulse: 86 63  70  Resp: 20 18    Temp: 98 F (36.7 C) (!) 97.4 F (36.3 C)    TempSrc: Oral Oral    SpO2: 97% 96% 95% 95%  Weight:  117.1 kg (258 lb 2.5 oz)  Height:        General: Pt is alert, awake, not in acute distress Cardiovascular: RRR, S1/S2 +, no rubs, no gallops Respiratory: CTA bilaterally, no wheezing, no rhonchi Abdominal: Soft, NT, ND, bowel sounds + Extremities: no edema, no cyanosis    The results of significant diagnostics from this hospitalization (including imaging, microbiology, ancillary and laboratory) are listed below for reference.     Microbiology: No results found for this or any previous visit (from the past 240 hour(s)).   Labs: BNP (last 3 results)  Recent Labs  08/10/17 1936 08/11/17 1728  BNP 126.9* 950.9*   Basic Metabolic Panel:  Recent Labs Lab 08/09/17 1014 08/10/17 1936 08/11/17 0338 08/12/17 0518  NA 137 136 135 135  K 4.0 3.6 3.4* 4.3  CL  --  103 105 102  CO2 23 24 23 25   GLUCOSE 148* 163* 150* 163*  BUN 14.7 18 18 18   CREATININE 1.0 1.05* 1.04* 1.05*  CALCIUM 9.4 8.8* 8.5* 8.9   Liver Function Tests:  Recent Labs Lab 08/09/17 1014 08/10/17 1936  AST 17 43*  ALT 12 34  ALKPHOS 101 77  BILITOT 0.29 0.5  PROT 6.8 6.7  ALBUMIN 3.4* 3.4*   No results for input(s): LIPASE, AMYLASE in the last 168 hours. No results for input(s): AMMONIA in the last 168 hours. CBC:  Recent Labs Lab 08/09/17 1014 08/10/17 1936 08/11/17 0338 08/12/17 0518  WBC 7.9 14.1* 9.7 8.9  NEUTROABS 4.7 10.7*  --   --   HGB 10.6* 9.5* 9.3* 9.3*  HCT 32.9* 29.4* 28.8* 28.4*  MCV 81.8 83.3 81.8 82.8  PLT 306 269 232 268   Cardiac  Enzymes:  Recent Labs Lab 08/10/17 1936 08/10/17 2246 08/11/17 0338 08/11/17 1014 08/11/17 1538  TROPONINI 0.09* 0.09* 0.08* 0.08* 0.08*   BNP: Invalid input(s): POCBNP CBG:  Recent Labs Lab 08/11/17 1136 08/11/17 1718 08/11/17 2124 08/12/17 0745 08/12/17 1152  GLUCAP 149* 191* 301* 144* 203*   D-Dimer No results for input(s): DDIMER in the last 72 hours. Hgb A1c No results for input(s): HGBA1C in the last 72 hours. Lipid Profile  Recent Labs  08/11/17 0338  CHOL 153  HDL 38*  LDLCALC 84  TRIG 153*  CHOLHDL 4.0   Thyroid function studies No results for input(s): TSH, T4TOTAL, T3FREE, THYROIDAB in the last 72 hours.  Invalid input(s): FREET3 Anemia work up No results for input(s): VITAMINB12, FOLATE, FERRITIN, TIBC, IRON, RETICCTPCT in the last 72 hours. Urinalysis    Component Value Date/Time   COLORURINE STRAW (A) 08/10/2017 1954   APPEARANCEUR CLEAR 08/10/2017 1954   LABSPEC 1.004 (L) 08/10/2017 1954   PHURINE 6.0 08/10/2017 1954   GLUCOSEU NEGATIVE 08/10/2017 1954   HGBUR SMALL (A) 08/10/2017 [place] NEGATIVE 08/10/2017 1954   KETONESUR NEGATIVE 08/10/2017 1954   PROTEINUR NEGATIVE 08/10/2017 1954   UROBILINOGEN 0.2 04/06/2015 1332   NITRITE NEGATIVE 08/10/2017 1954   LEUKOCYTESUR LARGE (A) 08/10/2017 1954   Sepsis Labs Invalid input(s): PROCALCITONIN,  WBC,  LACTICIDVEN Microbiology No results found for this or any previous visit (from the past 240 hour(s)).   Time coordinating discharge: Over 30 minutes  SIGNED:   Elmarie Shiley, MD  [facility] 08/12/2017, 3:05 PM Pager   If 7PM-7AM, please contact night-coverage [email or web address] Password [email or web address]

## 2017-08-12 NOTE — Progress Notes (Signed)
Velna Hatchet, RN went over discharge paperwork with patient and family.  All questions answered.  Discharge summary and prescriptions given to patient.  Pt discharged via wheelchair. VSS.

## 2017-08-12 NOTE — Telephone Encounter (Signed)
Left pt message giving hosp f/u appt w/ [facility] 10/2

## 2017-08-12 NOTE — Progress Notes (Signed)
Progress Note  Patient Name: Kiara Brown Date of Encounter: 08/12/2017  Primary Cardiologist: Dr. Marigene Ehlers, Dr. Harl Bowie  Subjective    Pt denies further chest pain and SOB today. She would like to discharge home.  Inpatient Medications    Scheduled Meds: . albuterol  2.5 mg Nebulization BID  . amitriptyline  20 mg Oral QHS  . amLODipine  5 mg Oral Daily  . aspirin EC  81 mg Oral Daily  . budesonide (PULMICORT) nebulizer solution  0.25 mg Nebulization BID  . buPROPion  150 mg Oral QAC breakfast  . folic acid  1 mg Oral Daily  . heparin  5,000 Units Subcutaneous Q8H  . insulin aspart  0-5 Units Subcutaneous QHS  . insulin aspart  0-9 Units Subcutaneous TID WC  . metoprolol tartrate  25 mg Oral BID  . pantoprazole  40 mg Oral Daily  . predniSONE  60 mg Oral Q breakfast   Continuous Infusions: . famotidine (PEPCID) IV Stopped (08/11/17 2253)   PRN Meds: acetaminophen, albuterol, ALPRAZolam, cyclobenzaprine, hydrALAZINE, lidocaine-prilocaine, morphine injection, nitroGLYCERIN, ondansetron (ZOFRAN) IV, sodium chloride flush   Vital Signs    Vitals:   08/11/17 1959 08/11/17 2114 08/12/17 0433 08/12/17 0824  BP:  (!) 144/71 (!) 146/56   Pulse:  86 63   Resp:  20 18   Temp:  98 F (36.7 C) (!) 97.4 F (36.3 C)   TempSrc:  Oral Oral   SpO2: 97% 97% 96% 95%  Weight:   258 lb 2.5 oz (117.1 kg)   Height:        Intake/Output Summary (Last 24 hours) at 08/12/17 1008 Last data filed at 08/12/17 0020  Gross per 24 hour  Intake             1300 ml  Output             3950 ml  Net            -2650 ml   Filed Weights   08/11/17 0200 08/11/17 0455 08/12/17 0433  Weight: 262 lb 5.6 oz (119 kg) 263 lb 0.1 oz (119.3 kg) 258 lb 2.5 oz (117.1 kg)     Physical Exam   General: Well developed, well nourished, female appearing in no acute distress. Head: Normocephalic, atraumatic.  Neck: Supple without bruits, no JVD. Lungs:  Resp regular and unlabored, CTA. Heart: RRR,  S1, S2, no S3, S4, or murmur; no rub. Abdomen: Soft, non-tender, non-distended with normoactive bowel sounds. No hepatomegaly. No rebound/guarding. No obvious abdominal masses. Extremities: No clubbing, cyanosis, trace edema. Distal pedal pulses are faint bilaterally. Neuro: Alert and oriented X 3. Moves all extremities spontaneously. Psych: Normal affect.  Labs    Chemistry Recent Labs Lab 08/09/17 1014 08/10/17 1936 08/11/17 0338 08/12/17 0518  NA 137 136 135 135  K 4.0 3.6 3.4* 4.3  CL  --  103 105 102  CO2 23 24 23 25   GLUCOSE 148* 163* 150* 163*  BUN 14.7 18 18 18   CREATININE 1.0 1.05* 1.04* 1.05*  CALCIUM 9.4 8.8* 8.5* 8.9  PROT 6.8 6.7  --   --   ALBUMIN 3.4* 3.4*  --   --   AST 17 43*  --   --   ALT 12 34  --   --   ALKPHOS 101 77  --   --   BILITOT 0.29 0.5  --   --   GFRNONAA  --  50* 51* 50*  GFRAA  --  58* 59* 58*  ANIONGAP 10 9 7 8      Hematology Recent Labs Lab 08/10/17 1936 08/11/17 0338 08/12/17 0518  WBC 14.1* 9.7 8.9  RBC 3.53* 3.52* 3.43*  HGB 9.5* 9.3* 9.3*  HCT 29.4* 28.8* 28.4*  MCV 83.3 81.8 82.8  MCH 26.9 26.4 27.1  MCHC 32.3 32.3 32.7  RDW 16.2* 16.1* 16.0*  PLT 269 232 268    Cardiac Enzymes Recent Labs Lab 08/10/17 2246 08/11/17 0338 08/11/17 1014 08/11/17 1538  TROPONINI 0.09* 0.08* 0.08* 0.08*   No results for input(s): TROPIPOC in the last 168 hours.   BNP Recent Labs Lab 08/10/17 1936 08/11/17 1728  BNP 126.9* 231.1*     DDimer No results for input(s): DDIMER in the last 168 hours.   Radiology    Dg Chest 2 View  Result Date: 08/10/2017 CLINICAL DATA:  Breast carcinoma.  Shortness of breath.  Chills. EXAM: CHEST  2 VIEW COMPARISON:  July 02, 2017 FINDINGS: There is no edema or consolidation. Heart size and pulmonary vascularity are normal. No adenopathy. Port-A-Cath tip is in the superior vena cava. No pneumothorax. No evident bone lesions. Patient is status post right mastectomy. IMPRESSION: No edema or  consolidation. Electronically Signed   By: Lowella Grip III M.D.   On: 08/10/2017 19:15   Ct Angio Chest Pe W Or Wo Contrast  Result Date: 08/11/2017 CLINICAL DATA:  76 year old female with hypoxemia and respiratory failure. History of breast cancer and right mastectomy on 07/02/2017, currently undergoing treatment. EXAM: CT ANGIOGRAPHY CHEST WITH CONTRAST TECHNIQUE: Multidetector CT imaging of the chest was performed using the standard protocol during bolus administration of intravenous contrast. Multiplanar CT image reconstructions and MIPs were obtained to evaluate the vascular anatomy. CONTRAST:  100 cc intravenous Isovue 370 COMPARISON:  08/10/2017 and prior radiographs.  10/13/2013 CT FINDINGS: Cardiovascular: This is a technically adequate study although respiratory motion artifact, particularly in the lower lungs, decreases sensitivity. No pulmonary emboli are identified. Upper limits normal heart size noted. LAD coronary artery calcifications present. No pericardial effusion or thoracic aortic aneurysm. A left IJ Port-A-Cath is noted with tip in the lower SVC. Mediastinum/Nodes: No enlarged mediastinal, hilar, or axillary lymph nodes. Thyroid gland, trachea, and esophagus demonstrate no significant findings. Lungs/Pleura: Interlobular septal thickening identified bilaterally and may represent mild edema. Trace bilateral pleural effusions and mild dependent/bibasilar atelectasis noted. No definite airspace disease or mass noted. There is no evidence of pneumothorax. Upper Abdomen: No acute abnormality Musculoskeletal: Right mastectomy and axillary surgical changes noted. No acute or suspicious bony abnormality. Review of the MIP images confirms the above findings. IMPRESSION: 1. No evidence of pulmonary emboli 2. Bilateral interstitial opacities suggesting interstitial pulmonary edema. 3. Trace bilateral pleural effusions and mild dependent/basilar atelectasis. 4. Coronary artery disease  Electronically Signed   By: Margarette Canada M.D.   On: 08/11/2017 13:45     Telemetry    NSR - Personally Reviewed  ECG    NSR - Personally Reviewed  Cardiac Studies   Echocardiogram 08/11/17: Study Conclusions - Left ventricle: The cavity size was normal. Systolic function was   normal. The estimated ejection fraction was in the range of 60%   to 65%. Wall motion was normal; there were no regional wall   motion abnormalities. Mild basal septal hypertrophy. Diastolic   dysfunction, grade indeterminate. - Mitral valve: There was moderate regurgitation. - Right ventricle: The cavity size was mildly dilated. Wall   thickness was normal. - Tricuspid valve: There was moderate regurgitation. -  Pulmonary arteries: PA peak pressure: 53 mm Hg (S).  Patient Profile     76 y.o. female history of breast cancer treated with anastrozole and trastuzumab s/p right mastectomy followed in cardio-onc clinic by Dr Marigene Ehlers. Presented with chest pain and SOB that started during initial herceptin infusion.  Assessment & Plan    1. Elevated troponin - chest pain at the onset of hereceptin infusion, infusion stopped for systemic symptoms - troponin peaked at 0.09 and trended down in the setting of constant chest pressure x 24 hr worse with inspiration; could be related to HTN at the time of the infusion -  Echo without WMA, normal systolic function, but diastolic dysfunction - CTA with coronary artery disease - will review with attending - no further chest pain   2. Diastolic dysfunction, moderate MR - echo unable to assess degree - BNP 231.1 on 08/11/17 with a creatinine 1.04 - discussed importance of daily weights - pt states she has had lower extremity swelling that has worsened over the last 6-9 months and that she has been gaining 1-2 lbs per month - pt would likely benefit from 20 mg lasix daily at discharge especially in the setting of steroid taper, lasix held yesterday for contrast load from  CTA   3. HTN - pressures better in the 140s from 170-180s - continue norvasc and lopressor   4. Shortness of breath - yesterday, increased SOB with hypoxia in the 80s - raising concern for PE - CTA negative for PE, but with bilateral interstitial opacities suggesting interstitial pulmonary edema vs pneumonitis - no longer requiring supplemental O2 today - pt states the breathing treatments and steroids are helping her respiratory function    Signed, Ledora Bottcher , PA-C 10:08 AM 08/12/2017 Pager: [phone]  Attending Note:   The patient was seen and examined.  Agree with assessment and plan as noted above.  Changes made to the above note as needed.  Patient seen and independently examined with Doreene Adas, PA .   We discussed all aspects of the encounter. I agree with the assessment and plan as stated above.  1.  Minimal troponin elevation:   Flat trend.  Not c/w ACS  CT angio showed coronary artery calcifications.  Pain free.   Has some weight gain  Agree with plans for low dose Lasix With follow up in the CHF clinic in several weeks    Follow up with Dr. Aundra Dubin in the   I have spent a total of 40 minutes with patient reviewing hospital  notes , telemetry, EKGs, labs and examining patient as well as establishing an assessment and plan that was discussed with the patient. > 50% of time was spent in direct patient care.  Will sign off. Call for questions   Ramond Dial., MD, [facility] 08/12/2017, 1:16 PM [address]  [place] Pager [phone]

## 2017-08-13 ENCOUNTER — Telehealth: Payer: Self-pay | Admitting: Oncology

## 2017-08-13 NOTE — Telephone Encounter (Signed)
Left voicemail for patient regarding the changes in her appts per 9/15 sch msg.

## 2017-08-14 ENCOUNTER — Telehealth: Payer: Self-pay

## 2017-08-14 DIAGNOSIS — R300 Dysuria: Secondary | ICD-10-CM | POA: Diagnosis not present

## 2017-08-14 DIAGNOSIS — R8290 Unspecified abnormal findings in urine: Secondary | ICD-10-CM | POA: Diagnosis not present

## 2017-08-14 NOTE — Telephone Encounter (Signed)
Chemo f/u call.  See flowsheet 

## 2017-08-16 LAB — CULTURE, BLOOD (ROUTINE X 2)
Culture: NO GROWTH
Special Requests: ADEQUATE

## 2017-08-19 ENCOUNTER — Other Ambulatory Visit (HOSPITAL_BASED_OUTPATIENT_CLINIC_OR_DEPARTMENT_OTHER): Payer: Medicare Other

## 2017-08-19 ENCOUNTER — Ambulatory Visit (HOSPITAL_BASED_OUTPATIENT_CLINIC_OR_DEPARTMENT_OTHER): Payer: Medicare Other | Admitting: Medical

## 2017-08-19 ENCOUNTER — Telehealth: Payer: Self-pay

## 2017-08-19 ENCOUNTER — Other Ambulatory Visit: Payer: Self-pay | Admitting: *Deleted

## 2017-08-19 VITALS — BP 126/53 | HR 65 | Temp 97.9°F | Resp 17 | Ht 63.0 in | Wt 257.1 lb

## 2017-08-19 DIAGNOSIS — C50211 Malignant neoplasm of upper-inner quadrant of right female breast: Secondary | ICD-10-CM | POA: Diagnosis present

## 2017-08-19 DIAGNOSIS — I10 Essential (primary) hypertension: Secondary | ICD-10-CM

## 2017-08-19 DIAGNOSIS — R1032 Left lower quadrant pain: Secondary | ICD-10-CM | POA: Diagnosis not present

## 2017-08-19 DIAGNOSIS — K5792 Diverticulitis of intestine, part unspecified, without perforation or abscess without bleeding: Secondary | ICD-10-CM | POA: Diagnosis not present

## 2017-08-19 DIAGNOSIS — R197 Diarrhea, unspecified: Secondary | ICD-10-CM

## 2017-08-19 LAB — COMPREHENSIVE METABOLIC PANEL
ALT: 19 U/L (ref 0–55)
ANION GAP: 9 meq/L (ref 3–11)
AST: 15 U/L (ref 5–34)
Albumin: 3.3 g/dL — ABNORMAL LOW (ref 3.5–5.0)
Alkaline Phosphatase: 102 U/L (ref 40–150)
BUN: 23.1 mg/dL (ref 7.0–26.0)
CHLORIDE: 98 meq/L (ref 98–109)
CO2: 28 meq/L (ref 22–29)
CREATININE: 1.2 mg/dL — AB (ref 0.6–1.1)
Calcium: 9.1 mg/dL (ref 8.4–10.4)
EGFR: 46 mL/min/{1.73_m2} — ABNORMAL LOW (ref >90)
Glucose: 149 mg/dl — ABNORMAL HIGH (ref 70–140)
Potassium: 4.3 mEq/L (ref 3.5–5.1)
SODIUM: 135 meq/L — AB (ref 136–145)
Total Bilirubin: 0.36 mg/dL (ref 0.20–1.20)
Total Protein: 7 g/dL (ref 6.4–8.3)

## 2017-08-19 LAB — CBC WITH DIFFERENTIAL/PLATELET
BASO%: 0.2 % (ref 0.0–2.0)
Basophils Absolute: 0 10*3/uL (ref 0.0–0.1)
EOS%: 0.7 % (ref 0.0–7.0)
Eosinophils Absolute: 0.1 10*3/uL (ref 0.0–0.5)
HCT: 36.8 % (ref 34.8–46.6)
HGB: 11.6 g/dL (ref 11.6–15.9)
LYMPH%: 22.3 % (ref 14.0–49.7)
MCH: 26.5 pg (ref 25.1–34.0)
MCHC: 31.5 g/dL (ref 31.5–36.0)
MCV: 84.2 fL (ref 79.5–101.0)
MONO#: 1.1 10*3/uL — AB (ref 0.1–0.9)
MONO%: 8.7 % (ref 0.0–14.0)
NEUT#: 8.5 10*3/uL — ABNORMAL HIGH (ref 1.5–6.5)
NEUT%: 68.1 % (ref 38.4–76.8)
PLATELETS: 323 10*3/uL (ref 145–400)
RBC: 4.37 10*6/uL (ref 3.70–5.45)
RDW: 15.6 % — ABNORMAL HIGH (ref 11.2–14.5)
WBC: 12.5 10*3/uL — ABNORMAL HIGH (ref 3.9–10.3)
lymph#: 2.8 10*3/uL (ref 0.9–3.3)

## 2017-08-19 MED ORDER — CIPROFLOXACIN HCL 500 MG PO TABS: 500.0000 mg | ORAL_TABLET | Freq: Two times a day (BID) | ORAL | 0 refills | Status: AC

## 2017-08-19 MED ORDER — METRONIDAZOLE 500 MG PO TABS: 500.0000 mg | ORAL_TABLET | Freq: Three times a day (TID) | ORAL | 0 refills | Status: DC

## 2017-08-19 NOTE — Telephone Encounter (Signed)
Spoke with pt and was informed that pt was feeling dizzy, unable to stay up for long.  Stated feeling better with lying down.  Stated had nausea but no vomiting - able to eat, drink some water, and diet soda.  Has no antiemetics at home.  Stated has had diarrhea - 5 episodes today with loose stools.  Instructed pt to take 2 tabs Imodium as soon as off the phone with triage nurse.  Instructed pt to drink water, soup broth, and Gatorade as tolerated. Lucianne Lei, [facility] notified.   Spoke with pt again, and instructed pt to come in for labs, and [facility] visit now as per PA's orders.   Pt voiced understanding. Pt's    Phone     [phone].

## 2017-08-20 ENCOUNTER — Telehealth: Payer: Self-pay | Admitting: Medical

## 2017-08-20 ENCOUNTER — Telehealth: Payer: Self-pay | Admitting: Oncology

## 2017-08-20 LAB — MAGNESIUM: Magnesium: 2.1 mg/dl (ref 1.5–2.5)

## 2017-08-20 NOTE — Telephone Encounter (Signed)
Loose stools have resolved. The patient continues to have some positional dizziness. She was instructed to continue to push fluid and to return as needed. Sandi Mealy, PA-C

## 2017-08-20 NOTE — Progress Notes (Signed)
[facility]pt[facility] Progress Note   Kiara Brown [id number] January 31, 1941 76 y.o.  Kiara Brown is managed by Dr. Tressa Busman  Actively treated with chemotherapy: yes  Current Therapy: Herceptin and Arimidex  Last Treated: 09 / 14 / 2018  Assessment/Plan:   Diarrhea, unspecified type - Plan: Fecal, C-Dff PCR  Left lower quadrant pain - Plan: ciprofloxacin (CIPRO) 500 MG tablet, metroNIDAZOLE (FLAGYL) 500 MG tablet  Diverticulitis - Plan: ciprofloxacin (CIPRO) 500 MG tablet, metroNIDAZOLE (FLAGYL) 500 MG tablet  Diarrhea: The patient is instructed to continue pushing fluids. She is given a collection container for a stool for C. difficile which she will collect and return.  Left lower quadrant pain with a history of diverticulitis: Patient was given a prescription for Cipro and Flagyl. She will be called tomorrow to assess her symptoms. Consideration will be given for a CT scan of the abdomen should she continue to have left lower quadrant abdominal pain.  Please see After Visit Summary for patient specific instructions.  Future Appointments Date Time Provider [place]  08/27/2017 2:30 PM Larey Dresser, MD [facility] None  09/06/2017 11:00 AM [facility] LAB 3 [facility] None  09/06/2017 11:30 AM Magrinat, Virgie Dad, MD [facility] None  09/20/2017 10:00 AM [facility] LAB 3 [facility] None  09/20/2017 10:45 AM [facility] B4 [facility] None  09/23/2017 2:00 PM [place] ECHO 1-BUZZ [facility]  09/23/2017 3:00 PM Larey Dresser, MD [facility] None    Orders Placed This Encounter  Procedures  . Fecal, C-Dff PCR      Subjective:   Patient ID:  Kiara Brown is a 76 y.o. (DOB 1941-04-06) female.  Chief Complaint:  Chief Complaint  Patient presents with  . Diarrhea    HPI Kiara Brown presents to the office today with a report of loose stools for the past 2 days and left lower quadrant abdominal pain. Patient reports that she developed  these symptoms late yesterday when she had 3 loose stools. She reports having chills last evening and sweats. She is having nausea with no vomiting. She feels "lightheaded" when changing positions such as going from lying to sitting and sitting to standing. She has been drinking adequate fluid and has been eating well. She had 7 loose stools today. She took 2 doses of Imodium this morning and has not had a loose stool since 2:00 this afternoon. She acknowledges a history of diverticulitis "a couple of years ago". She reports that her left lower quadrant abdominal pain that she is experiencing today is similar to her history of diverticulitis. She denies shortness of breath or dysuria. She was treated with Herceptin on 08/09/2017 and had a reaction to her treatment. Her Herceptin was stopped and then restarted. She had hypertension, diaphoresis, and rigor after her Herceptin was restarted. She was admitted to the hospital on 08/10/2017 with acute hypoxic respiratory failure which was believed related to pneumonitis. Her oxygen saturation was at 88% on room air. She had dyspnea. She was treated with Pepcid and IV Solu-Medrol, nebulizers and Pulmicort. She was discharged on a prednisone taper and was also given Lasix on discharge due to possible pulmonary edema. She was noted to have mild hypokalemia with potassium of 3.4. She was given potassium chloride ER 20 mEq once daily. According to the patient she was also treated for a urinary tract infection and was given Cipro. She should complete her Cipro today.  Abdominal Pain:  Kiara Brown reports abdominal pain.  The pain is described  as dull, and is moderate in intensity.  Her pain is located in the LLQ without radiation.  Onset was 1 day ago.  Symptoms have been unchanged since.  Aggravating factors: none.   Alleviating factors: none.  Associated symptoms: chills, diarrhea, nausea, sweats and positional dizziness.  The patient denies dysuria,  frequency, hematochezia, melena and vomiting.  Diarrhea:  Kiara Brown reports diarrhea.  Onset of diarrhea was yesterday.  Diarrhea is occurring approximately 7 times today.  Patient describes diarrhea as loose.  Diarrhea has been associated with abdominal pain described as dull.   Patient denies blood in stool, fever, illness in household contacts, recent travel.   Previous visits for diarrhea: none.  Evaluation to date: none.  Treatment to date: Imodium which does help.  Medications: I have reviewed the patient's current medications.  Allergies:  Allergies  Allergen Reactions  . Duloxetine     UNSPECIFIED REACTION   . Nsaids Rash  . Oseltamivir Diarrhea and Other (See Comments)    Abdominal pain     Past Medical History:  Diagnosis Date  . Arthritis    osteo, rheumatoid  . Breast cancer ([place])   . Diverticulosis   . Dysrhythmia    hx rapid heartbeat, no cardiologist  . Family history of breast cancer   . Family history of prostate cancer   . Fibromyalgia    peripheral neuropathy  . Fibromyalgia   . GERD (gastroesophageal reflux disease)   . H/O hiatal hernia   . Hyperlipidemia   . PONV (postoperative nausea and vomiting)    "THROAT WITH EXTREME [id number], ulnar nerve surgery  . Sciatica   . Shortness of breath dyspnea    with exertion  . UTI (lower urinary tract infection)     Past Surgical History:  Procedure Laterality Date  . ABDOMINAL HYSTERECTOMY    . APPENDECTOMY    . back injection      July 2018- x1  . BREAST SURGERY Left 1986   biopsy, Recent 05/2017 at [facility]- had biopsy on R breast- malignant   . CARDIAC CATHETERIZATION  2009   "no blockage per pt"  . CHOLECYSTECTOMY    . CYSTOSCOPY WITH BIOPSY    . INCONTINENCE SURGERY    . JOINT REPLACEMENT    . KNEE ARTHROSCOPY     bilateral  . KNEE ARTHROSCOPY Left 03/17/2014   Procedure: ARTHROSCOPY LEFT KNEE WITH SYNOVECTOMY;  Surgeon: Gearlean Alf, MD;  Location: [facility];  Service:  Orthopedics;  Laterality: Left;  . PORTACATH PLACEMENT Left 07/02/2017   Procedure: INSERTION PORT-A-CATH;  Surgeon: Excell Seltzer, MD;  Location: [place];  Service: General;  Laterality: Left;  . ROTATOR CUFF REPAIR     right  . SIMPLE MASTECTOMY WITH AXILLARY SENTINEL NODE BIOPSY Right 07/02/2017   Procedure: RIGHT TOTAL  MASTECTOMY WITH AXILLARY SENTINEL LYMPH  NODE BIOPSY;  Surgeon: Excell Seltzer, MD;  Location: [place];  Service: General;  Laterality: Right;  . TOTAL KNEE ARTHROPLASTY  08/04/2012   Procedure: TOTAL KNEE ARTHROPLASTY;  Surgeon: Gearlean Alf, MD;  Location: [facility];  Service: Orthopedics;  Laterality: Left;  . TOTAL KNEE ARTHROPLASTY Right 04/11/2015   Procedure: RIGHT TOTAL KNEE ARTHROPLASTY;  Surgeon: Gaynelle Arabian, MD;  Location: [facility];  Service: Orthopedics;  Laterality: Right;  . ulnar nerve surgery Right 1986    Family History  Problem Relation Age of Onset  . Hypertension Mother   . Stroke Mother   . Breast cancer Mother 57  died at 11  . Hypertension Father   . Heart attack Father        died at 37  . Hypertension Brother        MI  . Prostate cancer Brother 32  . Hypertension Brother        MI  . Lung cancer Brother        possible lung cancer, spot found on lung being monitored  . Hypertension Brother        MI  . Non-Hodgkin's lymphoma Sister 79  . Breast cancer Other 91       is currently 90  . Breast cancer Other 55  . Cancer Cousin 62       type of cancer unk.   . Cancer Other        type unknown, eye/face   . Cancer Other 53       died from cancer in his 38's, type unknown    Social History   Social History  . Marital status: Married    Spouse name: N/A  . Number of children: N/A  . Years of education: N/A   Occupational History  . Not on file.   Social History Main Topics  . Smoking status: Never Smoker  . Smokeless tobacco: Never Used  . Alcohol use No  . Drug use: No  . Sexual activity: Not on file   Other  Topics Concern  . Not on file   Social History Narrative  . No narrative on file    Past Medical History, Surgical history, Social history, and Family history were reviewed and updated as appropriate.   Please see review of systems for further details on the patient's review from today.   Review of Systems:  Review of Systems  Constitutional: Negative for appetite change, chills, diaphoresis and fever.  Respiratory: Negative for cough, chest tightness and shortness of breath.   Cardiovascular: Negative for chest pain, palpitations and leg swelling.  Gastrointestinal: Positive for diarrhea and nausea. Negative for abdominal distention, abdominal pain, blood in stool, constipation and vomiting.  Genitourinary: Negative for decreased urine volume, difficulty urinating, dysuria, flank pain and frequency.  Neurological: Negative for weakness.    Objective:   Physical Exam:  BP (!) 126/53 (BP Location: Left Arm, Patient Position: Sitting)   Pulse 65   Temp 97.9 F (36.6 C) (Oral)   Resp 17   Ht 5\' 3"  (1.6 m)   Wt 257 lb 1.6 oz (116.6 kg)   SpO2 98%   BMI 45.54 kg/m    Orthostatic Blood Pressure: Blood pressure:   sitting 134/77, standing 102/64 Pulse:   sitting 64, standing 72  ECOG: 1  Physical Exam  Constitutional: No distress.  HENT:  Head: Normocephalic and atraumatic.  Mouth/Throat: No oropharyngeal exudate.  Neck: Normal range of motion. Neck supple.  Cardiovascular: Normal rate, regular rhythm and normal heart sounds.  Exam reveals no gallop and no friction rub.   No murmur heard. Pulmonary/Chest: Effort normal and breath sounds normal. No respiratory distress. She has no wheezes. She has no rales.  Abdominal: Soft. Bowel sounds are normal. She exhibits no distension and no mass. There is tenderness (LLQ). There is no rebound and no guarding.  Lymphadenopathy:    She has no cervical adenopathy.  Neurological: She is alert. Coordination normal.  Skin: Skin is  warm and dry. No rash noted. She is not diaphoretic. No erythema.  Psychiatric: She has a normal mood and affect. Her behavior is normal.  Judgment and thought content normal.    Lab Review:     Component Value Date/Time   NA 135 (L) 08/19/2017 1535   K 4.3 08/19/2017 1535   CL 102 08/12/2017 0518   CO2 28 08/19/2017 1535   GLUCOSE 149 (H) 08/19/2017 1535   BUN 23.1 08/19/2017 1535   CREATININE 1.2 (H) 08/19/2017 1535   CALCIUM 9.1 08/19/2017 1535   PROT 7.0 08/19/2017 1535   ALBUMIN 3.3 (L) 08/19/2017 1535   AST 15 08/19/2017 1535   ALT 19 08/19/2017 1535   ALKPHOS 102 08/19/2017 1535   BILITOT 0.36 08/19/2017 1535   GFRNONAA 50 (L) 08/12/2017 0518   GFRAA 58 (L) 08/12/2017 0518       Component Value Date/Time   WBC 12.5 (H) 08/19/2017 1535   WBC 8.9 08/12/2017 0518   RBC 4.37 08/19/2017 1535   RBC 3.43 (L) 08/12/2017 0518   HGB 11.6 08/19/2017 1535   HCT 36.8 08/19/2017 1535   PLT 323 08/19/2017 1535   MCV 84.2 08/19/2017 1535   MCH 26.5 08/19/2017 1535   MCH 27.1 08/12/2017 0518   MCHC 31.5 08/19/2017 1535   MCHC 32.7 08/12/2017 0518   RDW 15.6 (H) 08/19/2017 1535   LYMPHSABS 2.8 08/19/2017 1535   MONOABS 1.1 (H) 08/19/2017 1535   EOSABS 0.1 08/19/2017 1535   BASOSABS 0.0 08/19/2017 1535   -------------------------------  Imaging from last 24 hours (if applicable):  Radiology interpretation: Dg Chest 2 View  Result Date: 08/10/2017 CLINICAL DATA:  Breast carcinoma.  Shortness of breath.  Chills. EXAM: CHEST  2 VIEW COMPARISON:  July 02, 2017 FINDINGS: There is no edema or consolidation. Heart size and pulmonary vascularity are normal. No adenopathy. Port-A-Cath tip is in the superior vena cava. No pneumothorax. No evident bone lesions. Patient is status post right mastectomy. IMPRESSION: No edema or consolidation. Electronically Signed   By: Lowella Grip III M.D.   On: 08/10/2017 19:15   Ct Angio Chest Pe W Or Wo Contrast  Result Date:  08/11/2017 CLINICAL DATA:  76 year old female with hypoxemia and respiratory failure. History of breast cancer and right mastectomy on 07/02/2017, currently undergoing treatment. EXAM: CT ANGIOGRAPHY CHEST WITH CONTRAST TECHNIQUE: Multidetector CT imaging of the chest was performed using the standard protocol during bolus administration of intravenous contrast. Multiplanar CT image reconstructions and MIPs were obtained to evaluate the vascular anatomy. CONTRAST:  100 cc intravenous Isovue 370 COMPARISON:  08/10/2017 and prior radiographs.  10/13/2013 CT FINDINGS: Cardiovascular: This is a technically adequate study although respiratory motion artifact, particularly in the lower lungs, decreases sensitivity. No pulmonary emboli are identified. Upper limits normal heart size noted. LAD coronary artery calcifications present. No pericardial effusion or thoracic aortic aneurysm. A left IJ Port-A-Cath is noted with tip in the lower SVC. Mediastinum/Nodes: No enlarged mediastinal, hilar, or axillary lymph nodes. Thyroid gland, trachea, and esophagus demonstrate no significant findings. Lungs/Pleura: Interlobular septal thickening identified bilaterally and may represent mild edema. Trace bilateral pleural effusions and mild dependent/bibasilar atelectasis noted. No definite airspace disease or mass noted. There is no evidence of pneumothorax. Upper Abdomen: No acute abnormality Musculoskeletal: Right mastectomy and axillary surgical changes noted. No acute or suspicious bony abnormality. Review of the MIP images confirms the above findings. IMPRESSION: 1. No evidence of pulmonary emboli 2. Bilateral interstitial opacities suggesting interstitial pulmonary edema. 3. Trace bilateral pleural effusions and mild dependent/basilar atelectasis. 4. Coronary artery disease Electronically Signed   By: Margarette Canada M.D.   On: 08/11/2017 13:45  This case was discussed with Dr. Ladell Pier

## 2017-08-20 NOTE — Telephone Encounter (Signed)
No 9/24 los.

## 2017-08-27 ENCOUNTER — Other Ambulatory Visit (HOSPITAL_COMMUNITY): Payer: Self-pay

## 2017-08-27 ENCOUNTER — Ambulatory Visit (HOSPITAL_COMMUNITY)
Admission: RE | Admit: 2017-08-27 | Discharge: 2017-08-27 | Disposition: A | Discharge status: Home / Self Care | Payer: Medicare Other | Source: Ambulatory Visit | Attending: Cardiology | Admitting: Cardiology

## 2017-08-27 ENCOUNTER — Encounter (HOSPITAL_COMMUNITY): Payer: Self-pay | Admitting: Cardiology

## 2017-08-27 ENCOUNTER — Encounter (HOSPITAL_COMMUNITY): Payer: Self-pay

## 2017-08-27 VITALS — BP 134/54 | HR 76 | Wt 264.0 lb

## 2017-08-27 DIAGNOSIS — Z853 Personal history of malignant neoplasm of breast: Secondary | ICD-10-CM | POA: Diagnosis not present

## 2017-08-27 DIAGNOSIS — I5032 Chronic diastolic (congestive) heart failure: Secondary | ICD-10-CM | POA: Diagnosis not present

## 2017-08-27 DIAGNOSIS — I25119 Atherosclerotic heart disease of native coronary artery with unspecified angina pectoris: Secondary | ICD-10-CM | POA: Insufficient documentation

## 2017-08-27 DIAGNOSIS — R0600 Dyspnea, unspecified: Secondary | ICD-10-CM | POA: Diagnosis not present

## 2017-08-27 DIAGNOSIS — Z8249 Family history of ischemic heart disease and other diseases of the circulatory system: Secondary | ICD-10-CM | POA: Diagnosis not present

## 2017-08-27 DIAGNOSIS — Z170 Estrogen receptor positive status [ER+]: Secondary | ICD-10-CM | POA: Diagnosis not present

## 2017-08-27 DIAGNOSIS — Z7982 Long term (current) use of aspirin: Secondary | ICD-10-CM | POA: Insufficient documentation

## 2017-08-27 DIAGNOSIS — M069 Rheumatoid arthritis, unspecified: Secondary | ICD-10-CM | POA: Insufficient documentation

## 2017-08-27 DIAGNOSIS — Z79899 Other long term (current) drug therapy: Secondary | ICD-10-CM | POA: Diagnosis not present

## 2017-08-27 DIAGNOSIS — I209 Angina pectoris, unspecified: Secondary | ICD-10-CM | POA: Diagnosis not present

## 2017-08-27 DIAGNOSIS — Z87440 Personal history of urinary (tract) infections: Secondary | ICD-10-CM | POA: Insufficient documentation

## 2017-08-27 DIAGNOSIS — R079 Chest pain, unspecified: Secondary | ICD-10-CM | POA: Diagnosis not present

## 2017-08-27 DIAGNOSIS — K219 Gastro-esophageal reflux disease without esophagitis: Secondary | ICD-10-CM | POA: Insufficient documentation

## 2017-08-27 DIAGNOSIS — C50211 Malignant neoplasm of upper-inner quadrant of right female breast: Secondary | ICD-10-CM

## 2017-08-27 DIAGNOSIS — E785 Hyperlipidemia, unspecified: Secondary | ICD-10-CM | POA: Diagnosis not present

## 2017-08-27 DIAGNOSIS — Z803 Family history of malignant neoplasm of breast: Secondary | ICD-10-CM | POA: Insufficient documentation

## 2017-08-27 LAB — CBC
HEMATOCRIT: 32.8 % — AB (ref 36.0–46.0)
HEMOGLOBIN: 10.4 g/dL — AB (ref 12.0–15.0)
MCH: 26.3 pg (ref 26.0–34.0)
MCHC: 31.7 g/dL (ref 30.0–36.0)
MCV: 83 fL (ref 78.0–100.0)
PLATELETS: 293 10*3/uL (ref 150–400)
RBC: 3.95 MIL/uL (ref 3.87–5.11)
RDW: 15.8 % — AB (ref 11.5–15.5)
WBC: 7.7 10*3/uL (ref 4.0–10.5)

## 2017-08-27 LAB — BASIC METABOLIC PANEL
ANION GAP: 10 (ref 5–15)
BUN: 12 mg/dL (ref 6–20)
CHLORIDE: 97 mmol/L — AB (ref 101–111)
CO2: 25 mmol/L (ref 22–32)
Calcium: 9.1 mg/dL (ref 8.9–10.3)
Creatinine, Ser: 0.96 mg/dL (ref 0.44–1.00)
GFR calc non Af Amer: 56 mL/min — ABNORMAL LOW (ref >60)
GLUCOSE: 175 mg/dL — AB (ref 65–99)
POTASSIUM: 4.3 mmol/L (ref 3.5–5.1)
Sodium: 132 mmol/L — ABNORMAL LOW (ref 135–145)

## 2017-08-27 LAB — PROTIME-INR
INR: 0.95
Prothrombin Time: 12.6 seconds (ref 11.4–15.2)

## 2017-08-27 LAB — BRAIN NATRIURETIC PEPTIDE: B Natriuretic Peptide: 68.2 pg/mL (ref 0.0–100.0)

## 2017-08-27 MED ORDER — ISOSORBIDE MONONITRATE ER 30 MG PO TB24: 30.0000 mg | ORAL_TABLET | Freq: Every day | ORAL | 3 refills | Status: DC

## 2017-08-27 MED ORDER — ATORVASTATIN CALCIUM 40 MG PO TABS: 40.0000 mg | ORAL_TABLET | Freq: Every day | ORAL | 3 refills | Status: DC

## 2017-08-27 NOTE — Patient Instructions (Addendum)
Start Imdur 30 mg (1 tab) daily  Start Atorvastatin 40 mg daily   Labs drawn today (if we do not call you, then your lab work was stable)   Your physician has requested that you have a cardiac catheterization. Cardiac catheterization is used to diagnose and/or treat various heart conditions. Doctors may recommend this procedure for a number of different reasons. The most common reason is to evaluate chest pain. Chest pain can be a symptom of coronary artery disease (CAD), and cardiac catheterization can show whether plaque is narrowing or blocking your heart's arteries. This procedure is also used to evaluate the valves, as well as measure the blood flow and oxygen levels in different parts of your heart. For further information please visit [email or web address]. Please follow instruction sheet, as given.  Keep appointment on 09/23/17

## 2017-08-28 NOTE — Progress Notes (Signed)
Cardio-Oncology Clinic Consult Note   Referring Physician: Dr. Magrinat Primary Care: Dr. Woodyear Primary Cardiologist: Dr. Carry Ortez  HPI: Patient seen in cardio-oncology clinic at the request of Dr. Magrinat.  Kiara Brown is a 76 y.o. female with past medical history of hyperlipidemia and breast cancer who returns to cardio-oncology clinic for followup of dyspnea and chest pain.   Cancer Profile      Initial Diagnosis   05/23/17  Mammogram and right breast US at [facility] - Category C breast density in R upper inner quadrant 2.5 cm of grouped pleomorphic calcifications - 1.2 cm macrolobulated mass R upper inner quadrant - Right axilla sonographically negative.      Surgery   Biopsy 05/27/2017  Biopsy of 2 above masses.   - Larger mass, Grade 3 DCIS. 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  - Both with strong staining intensity, with MIB-1 of 50%, and HER-2 amplified     - Awaiting definitive surgery    Treatment Plan  1. Genetics testing pending 2. Anastrozole started 06/05/2017, to be continued and minimum of 5 years 3. Definite surgery pending: Likely mastectomy 4. Trastuzumab for 6 months to be given adjuvantly 5. Adjuvant radiation as appropriate  - Echo (7/18): EF 55-60%, GLS -20.4 - Echo (9/18): EF 60-65%, moderate MR, PASP 53 mmHg.   Since her initial visit, she was admitted in 9/18 with chest pain and dyspnea.  This happened shortly after her first Herceptin infusion. She developed hypotension, diaphoresis, and rigors.  This progressed to dyspnea and exertional chest pain.  She was admitted.  TnI was mildly elevated and 0.09 and BNP was elevated at 227.  She was diuresed with IV Lasix and given Solumedrol for pneumonitis versus CHF exacerbation.  CTA chest showed no PE but did show significant coronary calcification as well as pulmonary edema.  She  was sent home on Lasix 20 mg daily.  Currently, she is short of breath walking about 40 feet.  She will get central chest tightness also after walking about 40 feet.  This will resolve with rest.  This is all new over the last month or so.   ECG (personally reviewed): NSR, normal  Labs (9/18): K 4.3, creatinine 1.2, LDL 84  ROS: All systems reviewed and negative except as per HPI.  Past Medical History:  Diagnosis Date  . Arthritis    osteo, rheumatoid  . Breast cancer ([facility])   . Diverticulosis   . Dysrhythmia    hx rapid heartbeat, no cardiologist  . Family history of breast cancer   . Family history of prostate cancer   . Fibromyalgia    peripheral neuropathy  . Fibromyalgia   . GERD (gastroesophageal reflux disease)   . H/O hiatal hernia   . Hyperlipidemia   . PONV (postoperative nausea and vomiting)    "THROAT WITH EXTREME BRUSING"1986, ulnar nerve surgery  . Sciatica   . Shortness of breath dyspnea    with exertion  . UTI (lower urinary tract infection)     Current Outpatient Prescriptions  Medication Sig Dispense Refill  . acetaminophen (TYLENOL) 500 MG tablet Take 500 mg by mouth every 6 (six) hours as needed (Pain). Pain    . albuterol (PROVENTIL HFA;VENTOLIN HFA) 108 (90 Base) MCG/ACT inhaler Inhale 2 puffs into the lungs every 6 (six) hours as needed for wheezing or shortness of breath. 1 Inhaler 2  . amitriptyline (ELAVIL) 10   MG tablet Take 20 mg by mouth at bedtime.     . anastrozole (ARIMIDEX) 1 MG tablet Take 1 tablet (1 mg total) by mouth daily. 90 tablet 4  . aspirin EC 81 MG tablet Take 81 mg by mouth daily.    . atenolol (TENORMIN) 50 MG tablet Take 50 mg by mouth daily before breakfast.     . buPROPion (WELLBUTRIN XL) 150 MG 24 hr tablet Take 150 mg by mouth daily before breakfast.    . cyclobenzaprine (FLEXERIL) 5 MG tablet Take 5 mg by mouth at bedtime as needed for muscle spasms.    . folic acid (FOLVITE) 1 MG tablet Take 1 mg by mouth daily.    .  furosemide (LASIX) 20 MG tablet Take 1 tablet (20 mg total) by mouth daily. 30 tablet 11  . lidocaine-prilocaine (EMLA) cream Apply 1 application topically as needed. 30 g 0  . omeprazole (PRILOSEC) 40 MG capsule Take 40 mg by mouth every morning.    . Potassium Chloride ER 20 MEQ TBCR Take 20 mg by mouth daily at 2 PM. 30 tablet 0  . atorvastatin (LIPITOR) 40 MG tablet Take 1 tablet (40 mg total) by mouth daily. 30 tablet 3  . isosorbide mononitrate (IMDUR) 30 MG 24 hr tablet Take 1 tablet (30 mg total) by mouth daily. 30 tablet 3   No current facility-administered medications for this encounter.     Allergies  Allergen Reactions  . Duloxetine     UNSPECIFIED REACTION   . Nsaids Rash  . Oseltamivir Diarrhea and Other (See Comments)    Abdominal pain       Social History   Social History  . Marital status: Married    Spouse name: N/A  . Number of children: N/A  . Years of education: N/A   Occupational History  . Not on file.   Social History Main Topics  . Smoking status: Never Smoker  . Smokeless tobacco: Never Used  . Alcohol use No  . Drug use: No  . Sexual activity: Not on file   Other Topics Concern  . Not on file   Social History Narrative  . No narrative on file      Family History  Problem Relation Age of Onset  . Hypertension Mother   . Stroke Mother   . Breast cancer Mother 70       died at 77  . Hypertension Father   . Heart attack Father        died at 68  . Hypertension Brother        MI  . Prostate cancer Brother 50  . Hypertension Brother        MI  . Lung cancer Brother        possible lung cancer, spot found on lung being monitored  . Hypertension Brother        MI  . Non-Hodgkin's lymphoma Sister 75  . Breast cancer Other 75       is currently 90  . Breast cancer Other 55  . Cancer Cousin 62       type of cancer unk.   . Cancer Other        type unknown, eye/face   . Cancer Other 65       died from cancer in his 60's, type  unknown   Vitals:   08/27/17 1445  BP: (!) 134/54  Pulse: 76  SpO2: 99%  Weight: 264 lb (119.7 kg)     Physical Exam    General: NAD Neck: JVP 8 cm, no thyromegaly or thyroid nodule.  Lungs: Clear to auscultation bilaterally with normal respiratory effort. CV: Nondisplaced PMI.  Heart regular S1/S2, no S3/S4, 1/6 HSM at apex.  No peripheral edema.  No carotid bruit.  Normal pedal pulses.  Abdomen: Soft, nontender, no hepatosplenomegaly, no distention.  Skin: Intact without lesions or rashes.  Neurologic: Alert and oriented x 3.  Psych: Normal affect. Extremities: No clubbing or cyanosis.  HEENT: Normal.    Assessment/Plan    1. CAD: Patient has known CAD based on CTA chest done in 9/18 (coronary calcium noted).  She has had 4 brothers with coronary disease, also her father and mother had MIs.  Her exertional chest tightness is very concerning for symptomatic coronary disease. ECG is normal.  - I will arrange for coronary angiography.  We discussed risks/benefits and she agrees to proceed. Until the cath, she will try to avoid heavy exertion.  She understands to come to the ER with prolonged pain not resolved by rest. - She will need a statin, start atorvastatin 40 mg daily.  - Continue ASA 81 daily.  - Start Imdur 30 mg daily to help with angina.  2. Chronic diastolic CHF: Echo 9/18 with EF 60-65%, moderate MR, PASP 53 mmHg.  She was volume overloaded during 9/18 admit, now on Lasix.  Probably mild volume overload on exam.  - Continue same Lasix for now.  - I will do RHC with coronary angiography to assess filling pressures. I will increase Lasix if filling pressures are elevated.  3. Breast cancer: She has had one dose of Herceptin with a significant reaction after getting the medication.  Will wait to hear from Dr. Magrinat about future therapy. If she continues Herceptin, next echo will be needed in about 3 months.   Lyrik Buresh 08/28/2017  

## 2017-08-30 ENCOUNTER — Ambulatory Visit: Payer: Medicare Other | Admitting: Adult Health

## 2017-08-30 ENCOUNTER — Ambulatory Visit: Payer: Medicare Other

## 2017-08-30 ENCOUNTER — Other Ambulatory Visit: Payer: Medicare Other

## 2017-08-30 ENCOUNTER — Ambulatory Visit (HOSPITAL_COMMUNITY)
Admission: RE | Disposition: A | Discharge status: Home / Self Care | Payer: Self-pay | Source: Ambulatory Visit | Attending: Cardiology

## 2017-08-30 ENCOUNTER — Ambulatory Visit (HOSPITAL_COMMUNITY)
Admission: RE | Admit: 2017-08-30 | Discharge: 2017-08-30 | Disposition: A | Discharge status: Home / Self Care | Payer: Medicare Other | Source: Ambulatory Visit | Attending: Cardiology | Admitting: Cardiology

## 2017-08-30 ENCOUNTER — Encounter (HOSPITAL_COMMUNITY): Payer: Self-pay | Admitting: Cardiology

## 2017-08-30 DIAGNOSIS — R079 Chest pain, unspecified: Secondary | ICD-10-CM | POA: Diagnosis present

## 2017-08-30 DIAGNOSIS — M797 Fibromyalgia: Secondary | ICD-10-CM | POA: Insufficient documentation

## 2017-08-30 DIAGNOSIS — R0600 Dyspnea, unspecified: Secondary | ICD-10-CM | POA: Insufficient documentation

## 2017-08-30 DIAGNOSIS — Z823 Family history of stroke: Secondary | ICD-10-CM | POA: Insufficient documentation

## 2017-08-30 DIAGNOSIS — I25118 Atherosclerotic heart disease of native coronary artery with other forms of angina pectoris: Secondary | ICD-10-CM | POA: Diagnosis not present

## 2017-08-30 DIAGNOSIS — Z809 Family history of malignant neoplasm, unspecified: Secondary | ICD-10-CM | POA: Insufficient documentation

## 2017-08-30 DIAGNOSIS — Z888 Allergy status to other drugs, medicaments and biological substances status: Secondary | ICD-10-CM | POA: Diagnosis not present

## 2017-08-30 DIAGNOSIS — K219 Gastro-esophageal reflux disease without esophagitis: Secondary | ICD-10-CM | POA: Insufficient documentation

## 2017-08-30 DIAGNOSIS — R0602 Shortness of breath: Secondary | ICD-10-CM | POA: Diagnosis not present

## 2017-08-30 DIAGNOSIS — Z8042 Family history of malignant neoplasm of prostate: Secondary | ICD-10-CM | POA: Diagnosis not present

## 2017-08-30 DIAGNOSIS — Z7982 Long term (current) use of aspirin: Secondary | ICD-10-CM | POA: Insufficient documentation

## 2017-08-30 DIAGNOSIS — Z801 Family history of malignant neoplasm of trachea, bronchus and lung: Secondary | ICD-10-CM | POA: Insufficient documentation

## 2017-08-30 DIAGNOSIS — K449 Diaphragmatic hernia without obstruction or gangrene: Secondary | ICD-10-CM | POA: Insufficient documentation

## 2017-08-30 DIAGNOSIS — Z807 Family history of other malignant neoplasms of lymphoid, hematopoietic and related tissues: Secondary | ICD-10-CM | POA: Insufficient documentation

## 2017-08-30 DIAGNOSIS — Z803 Family history of malignant neoplasm of breast: Secondary | ICD-10-CM | POA: Insufficient documentation

## 2017-08-30 DIAGNOSIS — Z79899 Other long term (current) drug therapy: Secondary | ICD-10-CM | POA: Diagnosis not present

## 2017-08-30 DIAGNOSIS — I110 Hypertensive heart disease with heart failure: Secondary | ICD-10-CM | POA: Insufficient documentation

## 2017-08-30 DIAGNOSIS — M1990 Unspecified osteoarthritis, unspecified site: Secondary | ICD-10-CM | POA: Diagnosis not present

## 2017-08-30 DIAGNOSIS — M5430 Sciatica, unspecified side: Secondary | ICD-10-CM | POA: Diagnosis not present

## 2017-08-30 DIAGNOSIS — Z8249 Family history of ischemic heart disease and other diseases of the circulatory system: Secondary | ICD-10-CM | POA: Insufficient documentation

## 2017-08-30 DIAGNOSIS — Z853 Personal history of malignant neoplasm of breast: Secondary | ICD-10-CM | POA: Diagnosis not present

## 2017-08-30 DIAGNOSIS — I5032 Chronic diastolic (congestive) heart failure: Secondary | ICD-10-CM | POA: Diagnosis not present

## 2017-08-30 DIAGNOSIS — E785 Hyperlipidemia, unspecified: Secondary | ICD-10-CM | POA: Diagnosis not present

## 2017-08-30 DIAGNOSIS — I25119 Atherosclerotic heart disease of native coronary artery with unspecified angina pectoris: Secondary | ICD-10-CM | POA: Diagnosis not present

## 2017-08-30 HISTORY — PX: RIGHT/LEFT HEART CATH AND CORONARY ANGIOGRAPHY: CATH118266

## 2017-08-30 LAB — POCT I-STAT 3, VENOUS BLOOD GAS (G3P V)
Acid-Base Excess: 1 mmol/L (ref 0.0–2.0)
Bicarbonate: 26.4 mmol/L (ref 20.0–28.0)
O2 Saturation: 66 %
PCO2 VEN: 43.9 mmHg — AB (ref 44.0–60.0)
PH VEN: 7.386 (ref 7.250–7.430)
TCO2: 28 mmol/L (ref 22–32)
pO2, Ven: 35 mmHg (ref 32.0–45.0)

## 2017-08-30 LAB — POCT I-STAT 3, ART BLOOD GAS (G3+)
BICARBONATE: 24.5 mmol/L (ref 20.0–28.0)
O2 SAT: 99 %
TCO2: 26 mmol/L (ref 22–32)
pCO2 arterial: 36.8 mmHg (ref 32.0–48.0)
pH, Arterial: 7.431 (ref 7.350–7.450)
pO2, Arterial: 133 mmHg — ABNORMAL HIGH (ref 83.0–108.0)

## 2017-08-30 SURGERY — RIGHT/LEFT HEART CATH AND CORONARY ANGIOGRAPHY
Anesthesia: LOCAL

## 2017-08-30 MED ORDER — LIDOCAINE HCL 2 % IJ SOLN
INTRAMUSCULAR | Status: AC
  Filled 2017-08-30: qty 10

## 2017-08-30 MED ORDER — SODIUM CHLORIDE 0.9 % IV SOLN
INTRAVENOUS | Status: DC
  Administered 2017-08-30: 07:00:00 via INTRAVENOUS

## 2017-08-30 MED ORDER — FENTANYL CITRATE (PF) 100 MCG/2ML IJ SOLN
INTRAMUSCULAR | Status: AC
  Filled 2017-08-30: qty 2

## 2017-08-30 MED ORDER — MIDAZOLAM HCL 2 MG/2ML IJ SOLN
INTRAMUSCULAR | Status: DC | PRN
  Administered 2017-08-30: 1 mg via INTRAVENOUS

## 2017-08-30 MED ORDER — SODIUM CHLORIDE 0.9 % IV SOLN: 250.0000 mL | INTRAVENOUS | Status: DC | PRN

## 2017-08-30 MED ORDER — FENTANYL CITRATE (PF) 100 MCG/2ML IJ SOLN
INTRAMUSCULAR | Status: DC | PRN
  Administered 2017-08-30: 25 ug via INTRAVENOUS

## 2017-08-30 MED ORDER — ASPIRIN 81 MG PO CHEW: 81.0000 mg | CHEWABLE_TABLET | ORAL | Status: DC

## 2017-08-30 MED ORDER — SODIUM CHLORIDE 0.9 % IV SOLN: INTRAVENOUS | Status: AC

## 2017-08-30 MED ORDER — IOPAMIDOL (ISOVUE-370) INJECTION 76%
INTRAVENOUS | Status: AC
  Filled 2017-08-30: qty 100

## 2017-08-30 MED ORDER — IOPAMIDOL (ISOVUE-370) INJECTION 76%
INTRAVENOUS | Status: DC | PRN
  Administered 2017-08-30: 75 mL via INTRAVENOUS

## 2017-08-30 MED ORDER — LIDOCAINE HCL (PF) 1 % IJ SOLN
INTRAMUSCULAR | Status: DC | PRN
  Administered 2017-08-30: 18 mL
  Administered 2017-08-30: 8 mL

## 2017-08-30 MED ORDER — SODIUM CHLORIDE 0.9% FLUSH: 3.0000 mL | INTRAVENOUS | Status: DC | PRN

## 2017-08-30 MED ORDER — HEPARIN (PORCINE) IN NACL 2-0.9 UNIT/ML-% IJ SOLN
INTRAMUSCULAR | Status: AC
  Filled 2017-08-30: qty 1000

## 2017-08-30 MED ORDER — HEPARIN (PORCINE) IN NACL 2-0.9 UNIT/ML-% IJ SOLN
INTRAMUSCULAR | Status: AC | PRN
  Administered 2017-08-30: 1000 mL

## 2017-08-30 MED ORDER — SODIUM CHLORIDE 0.9% FLUSH: 3.0000 mL | Freq: Two times a day (BID) | INTRAVENOUS | Status: DC

## 2017-08-30 MED ORDER — MIDAZOLAM HCL 2 MG/2ML IJ SOLN
INTRAMUSCULAR | Status: AC
  Filled 2017-08-30: qty 2

## 2017-08-30 MED ORDER — FENTANYL CITRATE (PF) 100 MCG/2ML IJ SOLN
25.0000 ug | Freq: Once | INTRAMUSCULAR | Status: AC
  Administered 2017-08-30: 25 ug via INTRAVENOUS

## 2017-08-30 SURGICAL SUPPLY — 12 items
CATH INFINITI 5FR MULTPACK ANG (CATHETERS) ×1 IMPLANT
CATH SWAN GANZ 7F STRAIGHT (CATHETERS) ×1 IMPLANT
HOVERMATT SINGLE USE (MISCELLANEOUS) ×1 IMPLANT
KIT HEART LEFT (KITS) ×2 IMPLANT
PACK CARDIAC CATHETERIZATION (CUSTOM PROCEDURE TRAY) ×2 IMPLANT
SHEATH PINNACLE 5F 10CM (SHEATH) ×1 IMPLANT
SHEATH PINNACLE 7F 10CM (SHEATH) ×1 IMPLANT
SYR MEDRAD MARK V 150ML (SYRINGE) ×2 IMPLANT
TRANSDUCER W/STOPCOCK (MISCELLANEOUS) ×2 IMPLANT
TUBING CIL FLEX 10 FLL-RA (TUBING) ×2 IMPLANT
WIRE EMERALD 3MM-J .025X260CM (WIRE) ×1 IMPLANT
WIRE EMERALD 3MM-J .035X150CM (WIRE) ×1 IMPLANT

## 2017-08-30 NOTE — Interval H&P Note (Signed)
History and Physical Interval Note:  08/30/2017 7:46 AM  Kiara Brown  has presented today for surgery, with the diagnosis of cp  The various methods of treatment have been discussed with the patient and family. After consideration of risks, benefits and other options for treatment, the patient has consented to  Procedure(s): RIGHT/LEFT HEART CATH AND CORONARY ANGIOGRAPHY (N/A) as a surgical intervention .  The patient's history has been reviewed, patient examined, no change in status, stable for surgery.  I have reviewed the patient's chart and labs.  Questions were answered to the patient's satisfaction.     Welma Mccombs [employer]

## 2017-08-30 NOTE — H&P (View-Only) (Signed)
Cardio-Oncology Clinic Consult Note   Referring Physician: Dr. Jana Hakim Primary Care: Dr. Nance Pew Primary Cardiologist: Dr. Aundra Dubin  HPI: Patient seen in cardio-oncology clinic at the request of Dr. Jana Hakim.  Kiara Brown is a 76 y.o. female with past medical history of hyperlipidemia and breast cancer who returns to cardio-oncology clinic for followup of dyspnea and chest pain.   Cancer Profile      Initial Diagnosis   05/23/17  Mammogram and right breast US at [facility] - Category C breast density in R upper inner quadrant 2.5 cm of grouped pleomorphic calcifications - 1.2 cm macrolobulated mass R upper inner quadrant - Right axilla sonographically negative.      Surgery   Biopsy 05/27/2017  Biopsy of 2 above masses.   - Larger mass, Grade 3 DCIS. 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  - Both with strong staining intensity, with MIB-1 of 50%, and HER-2 amplified     - Awaiting definitive surgery    Treatment Plan  1. Genetics testing pending 2. Anastrozole started 06/05/2017, to be continued and minimum of 5 years 3. Definite surgery pending: Likely mastectomy 4. Trastuzumab for 6 months to be given adjuvantly 5. Adjuvant radiation as appropriate  - Echo (7/18): EF 55-60%, GLS -20.4 - Echo (9/18): EF 60-65%, moderate MR, PASP 53 mmHg.   Since her initial visit, she was admitted in 9/18 with chest pain and dyspnea.  This happened shortly after her first Herceptin infusion. She developed hypotension, diaphoresis, and rigors.  This progressed to dyspnea and exertional chest pain.  She was admitted.  TnI was mildly elevated and 0.09 and BNP was elevated at 227.  She was diuresed with IV Lasix and given Solumedrol for pneumonitis versus CHF exacerbation.  CTA chest showed no PE but did show significant coronary calcification as well as pulmonary edema.  She  was sent home on Lasix 20 mg daily.  Currently, she is short of breath walking about 40 feet.  She will get central chest tightness also after walking about 40 feet.  This will resolve with rest.  This is all new over the last month or so.   ECG (personally reviewed): NSR, normal  Labs (9/18): K 4.3, creatinine 1.2, LDL 84  ROS: All systems reviewed and negative except as per HPI.  Past Medical History:  Diagnosis Date  . Arthritis    osteo, rheumatoid  . Breast cancer ([place])   . Diverticulosis   . Dysrhythmia    hx rapid heartbeat, no cardiologist  . Family history of breast cancer   . Family history of prostate cancer   . Fibromyalgia    peripheral neuropathy  . Fibromyalgia   . GERD (gastroesophageal reflux disease)   . H/O hiatal hernia   . Hyperlipidemia   . PONV (postoperative nausea and vomiting)    "THROAT WITH EXTREME [id number]"4403, ulnar nerve surgery  . Sciatica   . Shortness of breath dyspnea    with exertion  . UTI (lower urinary tract infection)     Current Outpatient Prescriptions  Medication Sig Dispense Refill  . acetaminophen (TYLENOL) 500 MG tablet Take 500 mg by mouth every 6 (six) hours as needed (Pain). Pain    . albuterol (PROVENTIL HFA;VENTOLIN HFA) 108 (90 Base) MCG/ACT inhaler Inhale 2 puffs into the lungs every 6 (six) hours as needed for wheezing or shortness of breath. 1 Inhaler 2  . amitriptyline (ELAVIL) 10  MG tablet Take 20 mg by mouth at bedtime.     Marland Kitchen anastrozole (ARIMIDEX) 1 MG tablet Take 1 tablet (1 mg total) by mouth daily. 90 tablet 4  . aspirin EC 81 MG tablet Take 81 mg by mouth daily.    Marland Kitchen atenolol (TENORMIN) 50 MG tablet Take 50 mg by mouth daily before breakfast.     . buPROPion (WELLBUTRIN XL) 150 MG 24 hr tablet Take 150 mg by mouth daily before breakfast.    . cyclobenzaprine (FLEXERIL) 5 MG tablet Take 5 mg by mouth at bedtime as needed for muscle spasms.    . folic acid (FOLVITE) 1 MG tablet Take 1 mg by mouth daily.    .  furosemide (LASIX) 20 MG tablet Take 1 tablet (20 mg total) by mouth daily. 30 tablet 11  . lidocaine-prilocaine (EMLA) cream Apply 1 application topically as needed. 30 g 0  . omeprazole (PRILOSEC) 40 MG capsule Take 40 mg by mouth every morning.    . Potassium Chloride ER 20 MEQ TBCR Take 20 mg by mouth daily at 2 PM. 30 tablet 0  . atorvastatin (LIPITOR) 40 MG tablet Take 1 tablet (40 mg total) by mouth daily. 30 tablet 3  . isosorbide mononitrate (IMDUR) 30 MG 24 hr tablet Take 1 tablet (30 mg total) by mouth daily. 30 tablet 3   No current facility-administered medications for this encounter.     Allergies  Allergen Reactions  . Duloxetine     UNSPECIFIED REACTION   . Nsaids Rash  . Oseltamivir Diarrhea and Other (See Comments)    Abdominal pain       Social History   Social History  . Marital status: Married    Spouse name: N/A  . Number of children: N/A  . Years of education: N/A   Occupational History  . Not on file.   Social History Main Topics  . Smoking status: Never Smoker  . Smokeless tobacco: Never Used  . Alcohol use No  . Drug use: No  . Sexual activity: Not on file   Other Topics Concern  . Not on file   Social History Narrative  . No narrative on file      Family History  Problem Relation Age of Onset  . Hypertension Mother   . Stroke Mother   . Breast cancer Mother 41       died at 53  . Hypertension Father   . Heart attack Father        died at 23  . Hypertension Brother        MI  . Prostate cancer Brother 52  . Hypertension Brother        MI  . Lung cancer Brother        possible lung cancer, spot found on lung being monitored  . Hypertension Brother        MI  . Non-Hodgkin's lymphoma Sister 21  . Breast cancer Other 32       is currently 90  . Breast cancer Other 55  . Cancer Cousin 62       type of cancer unk.   . Cancer Other        type unknown, eye/face   . Cancer Other 65       died from cancer in his 50's, type  unknown   Vitals:   08/27/17 1445  BP: (!) 134/54  Pulse: 76  SpO2: 99%  Weight: 264 lb (119.7 kg)  Physical Exam    General: NAD Neck: JVP 8 cm, no thyromegaly or thyroid nodule.  Lungs: Clear to auscultation bilaterally with normal respiratory effort. CV: Nondisplaced PMI.  Heart regular S1/S2, no S3/S4, 1/6 HSM at apex.  No peripheral edema.  No carotid bruit.  Normal pedal pulses.  Abdomen: Soft, nontender, no hepatosplenomegaly, no distention.  Skin: Intact without lesions or rashes.  Neurologic: Alert and oriented x 3.  Psych: Normal affect. Extremities: No clubbing or cyanosis.  HEENT: Normal.    Assessment/Plan    1. CAD: Patient has known CAD based on CTA chest done in 9/18 (coronary calcium noted).  She has had 4 brothers with coronary disease, also her father and mother had MIs.  Her exertional chest tightness is very concerning for symptomatic coronary disease. ECG is normal.  - I will arrange for coronary angiography.  We discussed risks/benefits and she agrees to proceed. Until the cath, she will try to avoid heavy exertion.  She understands to come to the ER with prolonged pain not resolved by rest. - She will need a statin, start atorvastatin 40 mg daily.  - Continue ASA 81 daily.  - Start Imdur 30 mg daily to help with angina.  2. Chronic diastolic CHF: Echo 2/23 with EF 60-65%, moderate MR, PASP 53 mmHg.  She was volume overloaded during 9/18 admit, now on Lasix.  Probably mild volume overload on exam.  - Continue same Lasix for now.  - I will do RHC with coronary angiography to assess filling pressures. I will increase Lasix if filling pressures are elevated.  3. Breast cancer: She has had one dose of Herceptin with a significant reaction after getting the medication.  Will wait to hear from Dr. Jana Hakim about future therapy. If she continues Herceptin, next echo will be needed in about 3 months.   Loralie Champagne 08/28/2017

## 2017-08-30 NOTE — Discharge Instructions (Signed)
Angiogram, Care After °This sheet gives you information about how to care for yourself after your procedure. Your health care provider may also give you more specific instructions. If you have problems or questions, contact your health care provider. °What can I expect after the procedure? °After the procedure, it is common to have bruising and tenderness at the catheter insertion area. °Follow these instructions at home: °Insertion site care  °· Follow instructions from your health care provider about how to take care of your insertion site. Make sure you: °¨ Wash your hands with soap and water before you change your bandage (dressing). If soap and water are not available, use hand sanitizer. °¨ Change your dressing as told by your health care provider. °¨ Leave stitches (sutures), skin glue, or adhesive strips in place. These skin closures may need to stay in place for 2 weeks or longer. If adhesive strip edges start to loosen and curl up, you may trim the loose edges. Do not remove adhesive strips completely unless your health care provider tells you to do that. °· Do not take baths, swim, or use a hot tub until your health care provider approves. °· You may shower 24-48 hours after the procedure or as told by your health care provider. °¨ Gently wash the site with plain soap and water. °¨ Pat the area dry with a clean towel. °¨ Do not rub the site. This may cause bleeding. °· Do not apply powder or lotion to the site. Keep the site clean and dry. °· Check your insertion site every day for signs of infection. Check for: °¨ Redness, swelling, or pain. °¨ Fluid or blood. °¨ Warmth. °¨ Pus or a bad smell. °Activity  °· Rest as told by your health care provider, usually for 1-2 days. °· Do not lift anything that is heavier than 10 lbs. (4.5 kg) or as told by your health care provider. °· Do not drive for 24 hours if you were given a medicine to help you relax (sedative). °· Do not drive or use heavy machinery while  taking prescription pain medicine. °General instructions  °· Return to your normal activities as told by your health care provider, usually in about a week. Ask your health care provider what activities are safe for you. °· If the catheter site starts bleeding, lie flat and put pressure on the site. If the bleeding does not stop, get help right away. This is a medical emergency. °· Drink enough fluid to keep your urine clear or pale yellow. This helps flush the contrast dye from your body. °· Take over-the-counter and prescription medicines only as told by your health care provider. °· Keep all follow-up visits as told by your health care provider. This is important. °Contact a health care provider if: °· You have a fever or chills. °· You have redness, swelling, or pain around your insertion site. °· You have fluid or blood coming from your insertion site. °· The insertion site feels warm to the touch. °· You have pus or a bad smell coming from your insertion site. °· You have bruising around the insertion site. °· You notice blood collecting in the tissue around the catheter site (hematoma). The hematoma may be painful to the touch. °Get help right away if: °· You have severe pain at the catheter insertion area. °· The catheter insertion area swells very fast. °· The catheter insertion area is bleeding, and the bleeding does not stop when you hold steady pressure on   the area. °· The area near or just beyond the catheter insertion site becomes pale, cool, tingly, or numb. °These symptoms may represent a serious problem that is an emergency. Do not wait to see if the symptoms will go away. Get medical help right away. Call your local emergency services (911 in the [place] Do not drive yourself to the hospital. °Summary °· After the procedure, it is common to have bruising and tenderness at the catheter insertion area. °· After the procedure, it is important to rest and drink plenty of fluids. °· Do not take baths,  swim, or use a hot tub until your health care provider says it is okay to do so. You may shower 24-48 hours after the procedure or as told by your health care provider. °· If the catheter site starts bleeding, lie flat and put pressure on the site. If the bleeding does not stop, get help right away. This is a medical emergency. °This information is not intended to replace advice given to you by your health care provider. Make sure you discuss any questions you have with your health care provider. °Document Released: 05/31/2005 Document Revised: 10/17/2016 Document Reviewed: 10/17/2016 °Elsevier Interactive Patient Education © 2017 [employer] ° °

## 2017-08-30 NOTE — Progress Notes (Signed)
Site area: rt groin fa and fv sheaths Site Prior to Removal:  Level 0 Pressure Applied For:  20 minutes Manual:   yes Patient Status During Pull:  stable Post Pull Site:  Level  0 Post Pull Instructions Given:  yes Post Pull Pulses Present: palpable Dressing Applied:  Gauze and tegaderm Bedrest begins @  0910 Comments:

## 2017-09-02 MED FILL — Fentanyl Citrate Preservative Free (PF) Inj 100 MCG/2ML: INTRAMUSCULAR | Qty: 2 | Status: CN

## 2017-09-02 MED FILL — Lidocaine HCl Local Inj 2%: INTRAMUSCULAR | Qty: 20 | Status: AC

## 2017-09-02 MED FILL — Lidocaine HCl Local Inj 2%: INTRAMUSCULAR | Qty: 10 | Status: CN

## 2017-09-03 ENCOUNTER — Ambulatory Visit (HOSPITAL_COMMUNITY): Admit: 2017-09-03 | Payer: Medicare Other | Admitting: Cardiovascular Disease

## 2017-09-03 ENCOUNTER — Encounter (HOSPITAL_COMMUNITY): Payer: Self-pay

## 2017-09-03 SURGERY — RIGHT/LEFT HEART CATH AND CORONARY ANGIOGRAPHY
Anesthesia: LOCAL

## 2017-09-04 NOTE — Progress Notes (Signed)
[place]  [phone] [phone]     ID: Kiara Brown DOB: 1940-11-29  MR#: [id number]  EGB#:[id number]  Patient Care Team: Burman Freestone, MD as PCP - General (Internal Medicine) Excell Seltzer, MD as Consulting Physician (General Surgery) Genella Bas, Virgie Dad, MD as Consulting Physician (Oncology) Gery Pray, MD as Consulting Physician (Radiation Oncology) Nevada Crane, MD as Referring Physician (Internal Medicine) Bensimhon, Shaune Pascal, MD as Consulting Physician (Cardiology) OTHER MD:  CHIEF COMPLAINT: Estrogen receptor positive breast cancer  CURRENT TREATMENT: Awaiting definitive surgery   BREAST CANCER HISTORY: From the original intake note:  "Kiara Brown" herself noted a change around her nipple on the right and brought it to medical attention. She was set up for bilateral diagnostic mammography with tomography and right breast ultrasonography at [facility] 05/23/2017. The breast density was category C. In the right breast upper inner quadrant there was a 2.5 cm area of grouped pleomorphic calcifications correlating with the palpable mass. Also there was a 1.2 cm macrolobulated mass in the upper inner quadrant also in the palpable region. These masses were identifiable by ultrasound with the same dimensions. The right axilla was sonographically negative.  On 05/27/2017 the patient underwent biopsy of the 2 masses in question. 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  The patient's subsequent history is as detailed below.  INTERVAL  HISTORY: Kiara Brown returns today for follow-up and treatment of her estrogen receptor positive breast cancer. Since her last visit here she had a episode of respiratory discomfort associated with her trastuzumab treatment, which was evaluated with a CT angiography chest. This showed no evidence of pulmonary embolism. There were some interstitial pulmonary changes. She was briefly admitted, treated with steroids, and the problems improved She is here today to discuss whether or not to continue anti-HER-2 treatment.  Meanwhile she continues on anastrozole, pt tolerates treatment fairly well. She does have about 1-2 hot flashes a day and notes that she will have to shed a layer of clothing for relief. She also has vaginal wetness and would like to go to physical therapy for this symptom.  REVIEW OF SYSTEMS: Kiara Brown has become more weak and unstable when walking. She endorses SOB with exertion. Pt has a mild dry cough. She denies unusual headaches, visual changes, nausea, vomiting, or dizziness. There has been no unusual phlegm production, or pleurisy. This been no change in bowel or bladder habits. She denies unexplained fatigue or unexplained weight loss, bleeding, rash, or fever. A detailed review of systems was otherwise entirely negative.   PAST MEDICAL HISTORY: Past Medical History:  Diagnosis Date  . Arthritis    osteo, rheumatoid  . Breast cancer ([place])   . Diverticulosis   . Dysrhythmia    hx rapid heartbeat, no cardiologist  . Family history of breast cancer   . Family history of prostate cancer   . Fibromyalgia    peripheral neuropathy  . Fibromyalgia   . GERD (gastroesophageal reflux disease)   . H/O hiatal hernia   . Hyperlipidemia   . PONV (postoperative nausea and vomiting)    "THROAT WITH EXTREME [id number], ulnar nerve  surgery  . Sciatica   . Shortness of breath dyspnea    with exertion  . UTI (lower urinary tract infection)     PAST SURGICAL HISTORY: Past Surgical History:    Procedure Laterality Date  . ABDOMINAL HYSTERECTOMY    . APPENDECTOMY    . back injection      July 2018- x1  . BREAST SURGERY Left 1986   biopsy, Recent 05/2017 at [facility]- had biopsy on R breast- malignant   . CARDIAC CATHETERIZATION  2009   "no blockage per pt"  . CHOLECYSTECTOMY    . CYSTOSCOPY WITH BIOPSY    . INCONTINENCE SURGERY    . JOINT REPLACEMENT    . KNEE ARTHROSCOPY     bilateral  . KNEE ARTHROSCOPY Left 03/17/2014   Procedure: ARTHROSCOPY LEFT KNEE WITH SYNOVECTOMY;  Surgeon: Gearlean Alf, MD;  Location: [facility];  Service: Orthopedics;  Laterality: Left;  . PORTACATH PLACEMENT Left 07/02/2017   Procedure: INSERTION PORT-A-CATH;  Surgeon: Excell Seltzer, MD;  Location: [place];  Service: General;  Laterality: Left;  . RIGHT/LEFT HEART CATH AND CORONARY ANGIOGRAPHY N/A 08/30/2017   Procedure: RIGHT/LEFT HEART CATH AND CORONARY ANGIOGRAPHY;  Surgeon: Larey Dresser, MD;  Location: [place] [facility];  Service: Cardiovascular;  Laterality: N/A;  . ROTATOR CUFF REPAIR     right  . SIMPLE MASTECTOMY WITH AXILLARY SENTINEL NODE BIOPSY Right 07/02/2017   Procedure: RIGHT TOTAL  MASTECTOMY WITH AXILLARY SENTINEL LYMPH  NODE BIOPSY;  Surgeon: Excell Seltzer, MD;  Location: [place];  Service: General;  Laterality: Right;  . TOTAL KNEE ARTHROPLASTY  08/04/2012   Procedure: TOTAL KNEE ARTHROPLASTY;  Surgeon: Gearlean Alf, MD;  Location: [facility];  Service: Orthopedics;  Laterality: Left;  . TOTAL KNEE ARTHROPLASTY Right 04/11/2015   Procedure: RIGHT TOTAL KNEE ARTHROPLASTY;  Surgeon: Gaynelle Arabian, MD;  [facility];  Service: Orthopedics;  Laterality: Right;  . ulnar nerve surgery Right 1986    FAMILY HISTORY Family History  Problem Relation Age of Onset  . Hypertension Mother   . Stroke Mother   . Breast cancer Mother 101       died at 46  . Hypertension Father   . Heart attack Father        died at 72  . Hypertension Brother        MI  . Prostate cancer Brother 74   . Hypertension Brother        MI  . Lung cancer Brother        possible lung cancer, spot found on lung being monitored  . Hypertension Brother        MI  . Non-Hodgkin's lymphoma Sister 63  . Breast cancer Other 30       is currently 90  . Breast cancer Other 55  . Cancer Cousin 62       type of cancer unk.   . Cancer Other        type unknown, eye/face   . Cancer Other 74       died from cancer in his 78's, type unknown   The patient's father died from a heart attack at age 32. The patient's mother was diagnosed with breast cancer at age 103 and died at age 77. The patient had 5 brothers, 3 sisters. One sister had breast cancer at age 44. One brother was diagnosed with prostate cancer at age 27. One Brother died from lung cancer at age 71. One sister had  non-Hodgkin's lymphoma diagnosed at age 7.   GYNECOLOGIC HISTORY:  No LMP recorded. Patient has had a hysterectomy.  menarche age 76, first live birth age 43. All 3 of her births were premature including 1 set of twins. One of the twins died a few hours after birth. She underwent hysterectomy in 1981 with unilateral salpingo-oophorectomy. 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. Her husband, Earvin Hansen began a business installing fences which is now carried on by his son Joselyn Glassman. Earvin Hansen is also the church organist. Son, Feliz Beam is a truck Hospital doctor in [employer]. The patient has one grandchild. She is a International aid/development worker.     ADVANCED DIRECTIVES: In place. The patient's husband is her healthcare power of attorney   HEALTH MAINTENANCE: Social History  Substance Use Topics  . Smoking status: Never Smoker  . Smokeless tobacco: Never Used  . Alcohol use No     Colonoscopy:2017 / [place]   PAP:  Bone density:June 2014, normal    Allergies  Allergen Reactions  . Duloxetine Other (See Comments)    UNSPECIFIED REACTION   . Nsaids Rash  .  Oseltamivir Diarrhea and Other (See Comments)    Abdominal pain     Current Outpatient Prescriptions  Medication Sig Dispense Refill  . acetaminophen (TYLENOL) 500 MG tablet Take 500 mg by mouth every 6 (six) hours as needed for moderate pain or headache.     . albuterol (PROVENTIL HFA;VENTOLIN HFA) 108 (90 Base) MCG/ACT inhaler Inhale 2 puffs into the lungs every 6 (six) hours as needed for wheezing or shortness of breath. 1 Inhaler 2  . amitriptyline (ELAVIL) 10 MG tablet Take 10 mg by mouth at bedtime.     Marland Kitchen anastrozole (ARIMIDEX) 1 MG tablet Take 1 tablet (1 mg total) by mouth daily. 90 tablet 4  . aspirin EC 81 MG tablet Take 81 mg by mouth daily.    Marland Kitchen atenolol (TENORMIN) 50 MG tablet Take 50 mg by mouth daily before breakfast.     . atorvastatin (LIPITOR) 40 MG tablet Take 1 tablet (40 mg total) by mouth daily. 30 tablet 3  . buPROPion (WELLBUTRIN XL) 150 MG 24 hr tablet Take 150 mg by mouth daily before breakfast.    . folic acid (FOLVITE) 1 MG tablet Take 1 mg by mouth daily.    . furosemide (LASIX) 20 MG tablet Take 1 tablet (20 mg total) by mouth daily. 30 tablet 11  . isosorbide mononitrate (IMDUR) 30 MG 24 hr tablet Take 1 tablet (30 mg total) by mouth daily. 30 tablet 3  . lidocaine-prilocaine (EMLA) cream Apply 1 application topically as needed. (Patient taking differently: Apply 1 application topically as needed (for pain). ) 30 g 0  . omeprazole (PRILOSEC) 40 MG capsule Take 40 mg by mouth every morning.    . Potassium Chloride ER 20 MEQ TBCR Take 20 mg by mouth daily at 2 PM. 30 tablet 0   No current facility-administered medications for this visit.     OBJECTIVE: Morbidly obese white womanWho appears stated age  Vitals:   09/06/17 1037  BP: (!) 142/64  Pulse: 71  Resp: 18  Temp: (!) 97.5 F (36.4 C)  SpO2: 98%     Body mass index is 40.14 kg/m.  Mervyn Gay Weights   09/06/17 1037  Weight: 264 lb (119.7 kg)     ECOG FS:2 - Symptomatic, <50% confined  to  bed  Sclerae unicteric, EOMs intact Oropharynx clear and moist No cervical or supraclavicular adenopathy Lungs no rales or rhonchi Heart regular rate and rhythm Abd soft, nontender, positive bowel sounds MSK no focal spinal tenderness, no upper extremity lymphedema Neuro: nonfocal, well oriented, appropriate affect Breasts: Status post right mastectomy, with no evidence of chest wall recurrence. Left breast is benign. Both axillae are benign.  LAB RESULTS:  CMP     Component Value Date/Time   NA 132 (L) 08/27/2017 1532   NA 135 (L) 08/19/2017 1535   K 4.3 08/27/2017 1532   K 4.3 08/19/2017 1535   CL 97 (L) 08/27/2017 1532   CO2 25 08/27/2017 1532   CO2 28 08/19/2017 1535   GLUCOSE 175 (H) 08/27/2017 1532   GLUCOSE 149 (H) 08/19/2017 1535   BUN 12 08/27/2017 1532   BUN 23.1 08/19/2017 1535   CREATININE 0.96 08/27/2017 1532   CREATININE 1.2 (H) 08/19/2017 1535   CALCIUM 9.1 08/27/2017 1532   CALCIUM 9.1 08/19/2017 1535   PROT 7.0 08/19/2017 1535   ALBUMIN 3.3 (L) 08/19/2017 1535   AST 15 08/19/2017 1535   ALT 19 08/19/2017 1535   ALKPHOS 102 08/19/2017 1535   BILITOT 0.36 08/19/2017 1535   GFRNONAA 56 (L) 08/27/2017 1532   GFRAA >60 08/27/2017 1532    No results found for: TOTALPROTELP, ALBUMINELP, A1GS, A2GS, BETS, BETA2SER, GAMS, MSPIKE, SPEI  No results found for: Nils Pyle, [facility]  Lab Results  Component Value Date   WBC 6.9 09/06/2017   NEUTROABS 4.3 09/06/2017   HGB 10.1 (L) 09/06/2017   HCT 30.9 (L) 09/06/2017   MCV 81.5 09/06/2017   PLT 328 09/06/2017      Chemistry      Component Value Date/Time   NA 132 (L) 08/27/2017 1532   NA 135 (L) 08/19/2017 1535   K 4.3 08/27/2017 1532   K 4.3 08/19/2017 1535   CL 97 (L) 08/27/2017 1532   CO2 25 08/27/2017 1532   CO2 28 08/19/2017 1535   BUN 12 08/27/2017 1532   BUN 23.1 08/19/2017 1535   CREATININE 0.96 08/27/2017 1532   CREATININE 1.2 (H) 08/19/2017 1535      Component Value  Date/Time   CALCIUM 9.1 08/27/2017 1532   CALCIUM 9.1 08/19/2017 1535   ALKPHOS 102 08/19/2017 1535   AST 15 08/19/2017 1535   ALT 19 08/19/2017 1535   BILITOT 0.36 08/19/2017 1535       No results found for: LABCA2  No components found for: [id number]  No results for input(s): INR in the last 168 hours.  Urinalysis    Component Value Date/Time   COLORURINE STRAW (A) 08/10/2017 1954   APPEARANCEUR CLEAR 08/10/2017 1954   LABSPEC 1.004 (L) 08/10/2017 1954   PHURINE 6.0 08/10/2017 1954   GLUCOSEU NEGATIVE 08/10/2017 1954   HGBUR SMALL (A) 08/10/2017 [place] NEGATIVE 08/10/2017 1954   KETONESUR NEGATIVE 08/10/2017 1954   PROTEINUR NEGATIVE 08/10/2017 1954   UROBILINOGEN 0.2 04/06/2015 1332   NITRITE NEGATIVE 08/10/2017 1954   LEUKOCYTESUR LARGE (A) 08/10/2017 1954     STUDIES: Dg Chest 2 View  Result Date: 08/10/2017 CLINICAL DATA:  Breast carcinoma.  Shortness of breath.  Chills. EXAM: CHEST  2 VIEW COMPARISON:  July 02, 2017 FINDINGS: There is no edema or consolidation. Heart size and pulmonary vascularity are normal. No adenopathy. Port-A-Cath tip is in the superior vena cava. No pneumothorax. No evident bone lesions. Patient is status post right mastectomy. IMPRESSION: No  edema or consolidation. Electronically Signed   By: Lowella Grip III M.D.   On: 08/10/2017 19:15   Ct Angio Chest Pe W Or Wo Contrast  Result Date: 08/11/2017 CLINICAL DATA:  76 year old female with hypoxemia and respiratory failure. History of breast cancer and right mastectomy on 07/02/2017, currently undergoing treatment. EXAM: CT ANGIOGRAPHY CHEST WITH CONTRAST TECHNIQUE: Multidetector CT imaging of the chest was performed using the standard protocol during bolus administration of intravenous contrast. Multiplanar CT image reconstructions and MIPs were obtained to evaluate the vascular anatomy. CONTRAST:  100 cc intravenous Isovue 370 COMPARISON:  08/10/2017 and prior radiographs.   10/13/2013 CT FINDINGS: Cardiovascular: This is a technically adequate study although respiratory motion artifact, particularly in the lower lungs, decreases sensitivity. No pulmonary emboli are identified. Upper limits normal heart size noted. LAD coronary artery calcifications present. No pericardial effusion or thoracic aortic aneurysm. A left IJ Port-A-Cath is noted with tip in the lower SVC. Mediastinum/Nodes: No enlarged mediastinal, hilar, or axillary lymph nodes. Thyroid gland, trachea, and esophagus demonstrate no significant findings. Lungs/Pleura: Interlobular septal thickening identified bilaterally and may represent mild edema. Trace bilateral pleural effusions and mild dependent/bibasilar atelectasis noted. No definite airspace disease or mass noted. There is no evidence of pneumothorax. Upper Abdomen: No acute abnormality Musculoskeletal: Right mastectomy and axillary surgical changes noted. No acute or suspicious bony abnormality. Review of the MIP images confirms the above findings. IMPRESSION: 1. No evidence of pulmonary emboli 2. Bilateral interstitial opacities suggesting interstitial pulmonary edema. 3. Trace bilateral pleural effusions and mild dependent/basilar atelectasis. 4. Coronary artery disease Electronically Signed   By: Margarette Canada M.D.   On: 08/11/2017 13:45     ELIGIBLE FOR AVAILABLE RESEARCH PROTOCOL: No  ASSESSMENT: 76 y.o. [place] woman status post right breast upper inner quadrant biopsy 2 on 05/27/2017 showing a clinical T1c N0, stage IA invasive ductal carcinoma, grade 3, estrogen and progesterone receptor positive, HER-2 amplified, with an MIB-1 of 60%  (1) genetics testing 07/14/2017 through the Multi-Cancer panel + Breast Cancer Panel found no deleterious mutations in  ALK, APC,AKT1, ATM, AXIN2,BAP1,  BARD1, BLM, BMPR1A, BRCA1, BRCA2, BRIP1, CASR, CDC73, CDH1, CDK4, CDKN1B, CDKN1C, CDKN2A (p14ARF), CDKN2A (p16INK4a), CEBPA, CHEK2, CTNNA1, DICER1, DIS3L2, EGFR  (c.2369C>T, p.Thr790Met variant only), EPCAM (Deletion/duplication testing only), FH, FAM175A, FLCN, GATA2, GPC3, GREM1 (Promoter region deletion/duplication testing only), HOXB13 (c.251G>A, p.Gly84Glu), HRAS, KIT, MAX, MEN1, MET, MITF (c.952G>A, p.Glu318Lys variant only), MLH1, MSH2, MSH3, MSH6, MUTYH, NBN, NF1, NF2, NTHL1, PALB2, PDGFRA, PHOX2B, PMS2, POLD1, POLE, POT1, PRKAR1A, PTCH1, PTEN, RAD50, RAD51C, RAD51D, RB1, RECQL4, RET, RUNX1, SDHAF2, SDHA (sequence changes only), SDHB, SDHC, SDHD, SMAD4, SMARCA4, SMARCB1, SMARCE1, STK11, SUFU, TERC, TERT, TMEM127, TP53, TSC1, TSC2, VHL, WRN, WT1, FANCC,MRE11,PIK3CA,RINT1,XRCC2.  (a)  3 Variants of Uncertain significance (VUS) were identified.  A Variant of uncertain significance (VUS) in BRCA1 c.2551G>A (p.Glu851Lys).  One VUS was found in CHEK2 c.679G>A (p.Gly227Arg).  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  Another VUS was identified in [place] c.704T>C (p.Ile235Thr).  The date of this test report is 07/14/2017.    (2) anastrozole started 06/05/2017, to be continued and minimum of 5 years   (3) status post right mastectomy and sentinel lymph node sampling 07/02/2017 for an mpT2 pN0, stage IB invasive ductal carcinoma, grade 3, with negative margins.  (a) the patient opted against reconstruction  (4) trastuzumab for 6 months to started 08/09/2017, discontinued after one dose with significant reaction  (a) baseline echocardiogram 06/19/2017 shows an excellent ejection fraction  (  5) to start lapatinib December 2018  PLAN: Kiara Brown still has not completely recovered from her reaction to trastuzumab. Of course there is still some trastuzumab in her system since these antibodies have a half-life of some weeks.  The upshot however is that there is no way I think we can go back to trastuzumab, Pertuzumab, or T-DM 1 in the future.  Lapatinib is a completely different preparation, and I do not anticipate her having the same type of reaction  from that pill. She understands that she may have a rash, diarrhea, and of course it may affect her ejection fraction among side effects.  What we are going to do, then, is continue the anastrozole, and start working to get her the lapatinib approved. I do not want to start at however until she returns to see [place] in December. If she has regained her functional status by that time, then we will start lapatinib at 750 mg daily and of course we will follow closely for side effects.  She already has significant vaginal dryness and the anastrozole is not going to make that any better. I am referring her to physical therapy for the "intimacy and pelvic health" program.  I have encouraged her to be as active as possible.  She wondered if she could go back to her prior medications for rheumatoid arthritis and I have no problems with that.  She will return to see [place] as noted in December. She knows to call for any problems that may develop before that visit.  Jameria Bradway, Virgie Dad, MD  09/06/17 10:56 AM Medical Oncology and Hematology [facility] [address] [place], [place] [address] Tel. [phone]    Fax. [phone]  This document serves as a record of services personally performed by Chauncey Cruel, MD. It was created on her behalf by Margit Banda, a trained medical scribe. The creation of this record is based on the scribe's personal observations and the provider's statements to them. This document has been checked and approved by the attending provider.

## 2017-09-05 ENCOUNTER — Other Ambulatory Visit: Payer: Self-pay

## 2017-09-05 ENCOUNTER — Encounter (HOSPITAL_COMMUNITY): Payer: Medicare Other | Admitting: Cardiology

## 2017-09-05 DIAGNOSIS — Z170 Estrogen receptor positive status [ER+]: Principal | ICD-10-CM

## 2017-09-05 DIAGNOSIS — C50211 Malignant neoplasm of upper-inner quadrant of right female breast: Secondary | ICD-10-CM

## 2017-09-06 ENCOUNTER — Other Ambulatory Visit (HOSPITAL_BASED_OUTPATIENT_CLINIC_OR_DEPARTMENT_OTHER): Payer: Medicare Other

## 2017-09-06 ENCOUNTER — Ambulatory Visit (HOSPITAL_BASED_OUTPATIENT_CLINIC_OR_DEPARTMENT_OTHER): Payer: Medicare Other | Admitting: Oncology

## 2017-09-06 VITALS — BP 142/64 | HR 71 | Temp 97.5°F | Resp 18 | Ht 68.0 in | Wt 264.0 lb

## 2017-09-06 DIAGNOSIS — C50211 Malignant neoplasm of upper-inner quadrant of right female breast: Secondary | ICD-10-CM | POA: Diagnosis not present

## 2017-09-06 DIAGNOSIS — I209 Angina pectoris, unspecified: Secondary | ICD-10-CM

## 2017-09-06 DIAGNOSIS — Z170 Estrogen receptor positive status [ER+]: Principal | ICD-10-CM

## 2017-09-06 LAB — COMPREHENSIVE METABOLIC PANEL
ALT: 14 U/L (ref 0–55)
AST: 20 U/L (ref 5–34)
Albumin: 3.4 g/dL — ABNORMAL LOW (ref 3.5–5.0)
Alkaline Phosphatase: 103 U/L (ref 40–150)
Anion Gap: 10 mEq/L (ref 3–11)
BUN: 11.2 mg/dL (ref 7.0–26.0)
CO2: 25 mEq/L (ref 22–29)
Calcium: 9.3 mg/dL (ref 8.4–10.4)
Chloride: 95 mEq/L — ABNORMAL LOW (ref 98–109)
Creatinine: 1.2 mg/dL — ABNORMAL HIGH (ref 0.6–1.1)
EGFR: 46 mL/min/{1.73_m2} — AB (ref >60)
GLUCOSE: 187 mg/dL — AB (ref 70–140)
Potassium: 4.2 mEq/L (ref 3.5–5.1)
Sodium: 131 mEq/L — ABNORMAL LOW (ref 136–145)
TOTAL PROTEIN: 6.9 g/dL (ref 6.4–8.3)
Total Bilirubin: 0.57 mg/dL (ref 0.20–1.20)

## 2017-09-06 LAB — CBC WITH DIFFERENTIAL/PLATELET
BASO%: 0.8 % (ref 0.0–2.0)
BASOS ABS: 0.1 10*3/uL (ref 0.0–0.1)
EOS ABS: 0.1 10*3/uL (ref 0.0–0.5)
EOS%: 2.1 % (ref 0.0–7.0)
HEMATOCRIT: 30.9 % — AB (ref 34.8–46.6)
HEMOGLOBIN: 10.1 g/dL — AB (ref 11.6–15.9)
LYMPH#: 1.8 10*3/uL (ref 0.9–3.3)
LYMPH%: 25.7 % (ref 14.0–49.7)
MCH: 26.5 pg (ref 25.1–34.0)
MCHC: 32.6 g/dL (ref 31.5–36.0)
MCV: 81.5 fL (ref 79.5–101.0)
MONO#: 0.6 10*3/uL (ref 0.1–0.9)
MONO%: 8.8 % (ref 0.0–14.0)
NEUT#: 4.3 10*3/uL (ref 1.5–6.5)
NEUT%: 62.6 % (ref 38.4–76.8)
Platelets: 328 10*3/uL (ref 145–400)
RBC: 3.79 10*6/uL (ref 3.70–5.45)
RDW: 16.6 % — AB (ref 11.2–14.5)
WBC: 6.9 10*3/uL (ref 3.9–10.3)

## 2017-09-06 LAB — DRAW EXTRA CLOT TUBE

## 2017-09-06 MED ORDER — LAPATINIB DITOSYLATE 250 MG PO TABS: 750.0000 mg | ORAL_TABLET | Freq: Every day | ORAL | 6 refills | Status: DC

## 2017-09-09 ENCOUNTER — Encounter: Payer: Self-pay | Admitting: *Deleted

## 2017-09-09 ENCOUNTER — Telehealth: Payer: Self-pay | Admitting: Pharmacy Technician

## 2017-09-09 NOTE — Telephone Encounter (Signed)
Oral Oncology Patient Advocate Encounter  Received notification from [place] that prior authorization for Tykerb is required.  PA submitted on CoverMyMeds Key [id number] Status is pending  Oral Oncology Clinic will continue to follow.  Fabio Asa. [name], [place] Patient [place] [phone] 09/09/2017 11:51 AM

## 2017-09-10 ENCOUNTER — Telehealth: Payer: Self-pay

## 2017-09-10 NOTE — Telephone Encounter (Signed)
Oral Oncology Patient Advocate Encounter  Received notification from [facility] [employer] (the patient's prescription drug insurance company) that the request for prior authorization for Tykerb has been denied due to the following:  The patients insurance company states that in order for Tykerb to be approved, the patient must have:   1-diagnosis or advanced or metastatic breast cancer in patients whose tumors overexpress HER2  AND who have received prior therapy with an anthracycline, a taxane and trasuzumab AND will be used in combination with capecitabine.  OR 2- Diagnosis of hormone receptor positive metastatic breast cancer in postmenopausal women (for whom homonal therapy is indicated who have tumors that overexpress her HER2 receptor AND will be used in combination with letrozole.    This determination will be appealed.   This encounter will continue to be updated until final determination.    Fabio Asa. [name], [place] Patient [place] [phone] 09/10/2017 12:43 PM

## 2017-09-10 NOTE — Telephone Encounter (Signed)
Pt was concerned that Dr Jana Hakim ordered an echo and she was seeing Dr Aundra Dubin who was also going to do an echo. This RN s/w Dr Claris Gladden office and clarified that the echo on 10/18 was for both Dr Jana Hakim and Dr Aundra Dubin. Called pt back and LVM saying same.

## 2017-09-11 ENCOUNTER — Telehealth: Payer: Self-pay | Admitting: Pharmacist

## 2017-09-11 ENCOUNTER — Telehealth: Payer: Self-pay | Admitting: Oncology

## 2017-09-11 DIAGNOSIS — C50211 Malignant neoplasm of upper-inner quadrant of right female breast: Secondary | ICD-10-CM

## 2017-09-11 DIAGNOSIS — Z170 Estrogen receptor positive status [ER+]: Principal | ICD-10-CM

## 2017-09-11 MED ORDER — LAPATINIB DITOSYLATE 250 MG PO TABS: 750.0000 mg | ORAL_TABLET | Freq: Every day | ORAL | 6 refills | Status: DC

## 2017-09-11 NOTE — Telephone Encounter (Signed)
Scheduled appt per 10/12 los - patient is aware of appt date and time - ECHO already scheduled.

## 2017-09-11 NOTE — Telephone Encounter (Signed)
Oral Oncology Pharmacist Encounter  Received new prescription for Tykerb (lapatinib) for the adjuvant treatment of stage IB, ER/PR+, Her-2 over amplified, breast cancer in conjunction with anastrazole, planned duration 6-12 months.  Labs from 09/06/17 assessed, OK for treatment. Patients LVEF is being monitored, next ECHO scheduled for 09/16/17 Last performed 08/11/17, LVEF 60-65%  Current medication list in Epic reviewed, no significant DDIs with Tykerb identified.  Prescription has been e-scribed to the [facility] for benefits analysis and approval.  Prior authorization has been submitted and was denied due to use off of FDA labeled indication due to Herceptin hypersensitivity reaction on 08/09/17, Herceptin is now contraindicated for patient, however targeted anti-Her-2 therapy is still indicated. PA denial will be appealed. Noted start date of Tykerb after office visit on 10/31/17.  Oral Oncology Clinic will continue to follow for insurance authorization, copayment issues, initial counseling and start date.  Johny Drilling, PharmD, BCPS, BCOP 09/11/2017 3:30 PM Oral Oncology Clinic [phone]

## 2017-09-12 ENCOUNTER — Other Ambulatory Visit (HOSPITAL_COMMUNITY): Payer: Medicare Other

## 2017-09-16 ENCOUNTER — Other Ambulatory Visit: Payer: Self-pay

## 2017-09-16 ENCOUNTER — Ambulatory Visit (HOSPITAL_COMMUNITY): Payer: Medicare Other | Attending: Cardiology

## 2017-09-16 DIAGNOSIS — M797 Fibromyalgia: Secondary | ICD-10-CM | POA: Diagnosis not present

## 2017-09-16 DIAGNOSIS — I083 Combined rheumatic disorders of mitral, aortic and tricuspid valves: Secondary | ICD-10-CM | POA: Diagnosis not present

## 2017-09-16 DIAGNOSIS — R0600 Dyspnea, unspecified: Secondary | ICD-10-CM | POA: Insufficient documentation

## 2017-09-16 DIAGNOSIS — C50211 Malignant neoplasm of upper-inner quadrant of right female breast: Secondary | ICD-10-CM | POA: Diagnosis not present

## 2017-09-16 DIAGNOSIS — Z170 Estrogen receptor positive status [ER+]: Secondary | ICD-10-CM | POA: Insufficient documentation

## 2017-09-16 DIAGNOSIS — E785 Hyperlipidemia, unspecified: Secondary | ICD-10-CM | POA: Diagnosis not present

## 2017-09-16 DIAGNOSIS — R079 Chest pain, unspecified: Secondary | ICD-10-CM | POA: Insufficient documentation

## 2017-09-18 NOTE — Telephone Encounter (Signed)
Oral Oncology Patient Advocate Encounter  Appeal packet has been submitted to [employer] in an effort to reverse the previous denial of Tykerb.    Appeal packet faxed to [phone].    Oral oncology clinic will continue to follow until final determination.   Fabio Asa. [name], [place] Patient [place] [phone] 09/18/2017 2:13 PM

## 2017-09-19 ENCOUNTER — Other Ambulatory Visit: Payer: Self-pay | Admitting: *Deleted

## 2017-09-19 DIAGNOSIS — H2513 Age-related nuclear cataract, bilateral: Secondary | ICD-10-CM | POA: Diagnosis not present

## 2017-09-19 NOTE — Telephone Encounter (Signed)
Oral Oncology Patient Advocate Encounter  Received notification from [name] that the denial for Tykerb has been upheld.  [name] will not cover Tykerb.    I will reach out to the patient to begin the process to apply for compassionate use medication from [employer].   I will continue to provide updates related to the acquisition of Tykerb for this patient.   Kiara Brown. [name], [place] Patient [place] [phone] 09/19/2017 5:13 PM

## 2017-09-20 ENCOUNTER — Other Ambulatory Visit: Payer: Medicare Other

## 2017-09-20 ENCOUNTER — Ambulatory Visit: Payer: Medicare Other

## 2017-09-23 ENCOUNTER — Ambulatory Visit (HOSPITAL_COMMUNITY)
Admission: RE | Admit: 2017-09-23 | Discharge: 2017-09-23 | Disposition: A | Discharge status: Home / Self Care | Payer: Medicare Other | Source: Ambulatory Visit | Attending: Cardiology | Admitting: Cardiology

## 2017-09-23 ENCOUNTER — Encounter (HOSPITAL_COMMUNITY): Payer: Self-pay | Admitting: Cardiology

## 2017-09-23 ENCOUNTER — Other Ambulatory Visit (HOSPITAL_COMMUNITY): Payer: Medicare Other

## 2017-09-23 VITALS — BP 129/62 | HR 75 | Wt 258.0 lb

## 2017-09-23 DIAGNOSIS — I209 Angina pectoris, unspecified: Secondary | ICD-10-CM

## 2017-09-23 DIAGNOSIS — Z170 Estrogen receptor positive status [ER+]: Secondary | ICD-10-CM | POA: Diagnosis not present

## 2017-09-23 DIAGNOSIS — C50919 Malignant neoplasm of unspecified site of unspecified female breast: Secondary | ICD-10-CM | POA: Insufficient documentation

## 2017-09-23 DIAGNOSIS — Z803 Family history of malignant neoplasm of breast: Secondary | ICD-10-CM | POA: Diagnosis not present

## 2017-09-23 DIAGNOSIS — Z79899 Other long term (current) drug therapy: Secondary | ICD-10-CM | POA: Diagnosis not present

## 2017-09-23 DIAGNOSIS — I5032 Chronic diastolic (congestive) heart failure: Secondary | ICD-10-CM | POA: Insufficient documentation

## 2017-09-23 DIAGNOSIS — C50211 Malignant neoplasm of upper-inner quadrant of right female breast: Secondary | ICD-10-CM | POA: Diagnosis not present

## 2017-09-23 DIAGNOSIS — R079 Chest pain, unspecified: Secondary | ICD-10-CM

## 2017-09-23 DIAGNOSIS — E785 Hyperlipidemia, unspecified: Secondary | ICD-10-CM | POA: Insufficient documentation

## 2017-09-23 DIAGNOSIS — Z7982 Long term (current) use of aspirin: Secondary | ICD-10-CM | POA: Diagnosis not present

## 2017-09-23 DIAGNOSIS — C50911 Malignant neoplasm of unspecified site of right female breast: Secondary | ICD-10-CM | POA: Diagnosis not present

## 2017-09-23 DIAGNOSIS — Z5181 Encounter for therapeutic drug level monitoring: Secondary | ICD-10-CM | POA: Diagnosis not present

## 2017-09-23 DIAGNOSIS — I2510 Atherosclerotic heart disease of native coronary artery without angina pectoris: Secondary | ICD-10-CM | POA: Diagnosis not present

## 2017-09-23 MED ORDER — POTASSIUM CHLORIDE ER 20 MEQ PO TBCR: 20.0000 mg | EXTENDED_RELEASE_TABLET | Freq: Every day | ORAL | 6 refills | Status: DC

## 2017-09-23 MED ORDER — ISOSORBIDE MONONITRATE ER 30 MG PO TB24: 15.0000 mg | ORAL_TABLET | Freq: Every day | ORAL | 3 refills | Status: DC

## 2017-09-23 MED ORDER — ATORVASTATIN CALCIUM 80 MG PO TABS: 80.0000 mg | ORAL_TABLET | Freq: Every day | ORAL | 3 refills | Status: DC

## 2017-09-23 NOTE — Patient Instructions (Signed)
Decrease IMDUR to 15mg  daily.  Increase Atorvastatin to 80mg  daily  Lab work in  66months   Follow up and Echo with Dr.McLean in March 2019.

## 2017-09-24 NOTE — Progress Notes (Signed)
Cardio-Oncology Clinic Consult Note   Referring Physician: Dr. Jana Hakim Primary Care: Dr. Nance Pew Primary Cardiologist: Dr. Aundra Dubin  HPI: Patient seen in cardio-oncology clinic at the request of Dr. Jana Hakim.  Kiara Brown is a 76 y.o. female with past medical history of hyperlipidemia and breast cancer who returns to cardio-oncology clinic for followup of dyspnea and chest pain.   Cancer Profile      Initial Diagnosis   05/23/17  Mammogram and right breast US at [facility] - Category C breast density in R upper inner quadrant 2.5 cm of grouped pleomorphic calcifications - 1.2 cm macrolobulated mass R upper inner quadrant - Right axilla sonographically negative.      Surgery   Biopsy 05/27/2017  Biopsy of 2 above masses.   - Larger mass, Grade 3 DCIS. 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  - Both with strong staining intensity, with MIB-1 of 50%, and HER-2 amplified     - Awaiting definitive surgery    Treatment Plan  1. Genetics testing pending 2. Anastrozole started 06/05/2017, to be continued and minimum of 5 years 3. Definite surgery pending: Likely mastectomy 4. Trastuzumab for 6 months to be given adjuvantly 5. Adjuvant radiation as appropriate  - Echo (7/18): EF 55-60%, GLS -20.4 - Echo (9/18): EF 60-65%, moderate MR, PASP 53 mmHg.  - LHC/RHC (10/18): Mean RA 12, PA 29/12 mean 21, mean PCWP 17, CI 2.85, EF 65-70%.  70% ostial D2 at trifurcation site where a medium D1 and medium D2 originate from the proximal LAD in close proximity.  - Echo (10/18): EF 60-65%, GLS -19.9%, mild MR/TR, PASP 30 mmHg.   She was admitted in 9/18 with chest pain and dyspnea.  This happened shortly after her first Herceptin infusion. She developed hypotension, diaphoresis, and rigors.  This progressed to dyspnea and exertional chest pain.  She was admitted.  TnI was  mildly elevated and 0.09 and BNP was elevated at 227.  She was diuresed with IV Lasix and given Solumedrol for pneumonitis versus CHF exacerbation.  CTA chest showed no PE but did show significant coronary calcification as well as pulmonary edema.  She was sent home on Lasix 20 mg daily.    Subsequently, we did right and left heart cath which showed mildly elevated right heart filling pressure and a 70% ostial D2 stenosis.  Lasix was increased to 40 mg daily and she was started on Imdur 30 mg daily.   She has been getting a headache with Imdur.  Chest pain frequency has decreased, now only getting rare chest tightness.  She still fatigues easily.  She is short of breath after walking about 40 feet which she says is somewhat improved.    ECG (personally reviewed): NSR, normal  Labs (9/18): K 4.3, creatinine 1.2, LDL 84, HDL 38  ROS: All systems reviewed and negative except as per HPI.  Past Medical History:  Diagnosis Date  . Arthritis    osteo, rheumatoid  . Breast cancer ([place])   . Diverticulosis   . Dysrhythmia    hx rapid heartbeat, no cardiologist  . Family history of breast cancer   . Family history of prostate cancer   . Fibromyalgia    peripheral neuropathy  . Fibromyalgia   . GERD (gastroesophageal reflux disease)   . H/O hiatal hernia   . Hyperlipidemia   . PONV (postoperative nausea and vomiting)    "THROAT WITH EXTREME  [id number], ulnar nerve surgery  . Sciatica   . Shortness of breath dyspnea    with exertion  . UTI (lower urinary tract infection)     Current Outpatient Prescriptions  Medication Sig Dispense Refill  . acetaminophen (TYLENOL) 500 MG tablet Take 500 mg by mouth every 6 (six) hours as needed for moderate pain or headache.     . albuterol (PROVENTIL HFA;VENTOLIN HFA) 108 (90 Base) MCG/ACT inhaler Inhale 2 puffs into the lungs every 6 (six) hours as needed for wheezing or shortness of breath. 1 Inhaler 2  . amitriptyline (ELAVIL) 10 MG tablet Take 10  mg by mouth at bedtime.     Marland Kitchen anastrozole (ARIMIDEX) 1 MG tablet Take 1 tablet (1 mg total) by mouth daily. 90 tablet 4  . aspirin EC 81 MG tablet Take 81 mg by mouth daily.    Marland Kitchen atenolol (TENORMIN) 50 MG tablet Take 50 mg by mouth daily before breakfast.     . atorvastatin (LIPITOR) 80 MG tablet Take 1 tablet (80 mg total) by mouth daily. 30 tablet 3  . buPROPion (WELLBUTRIN XL) 150 MG 24 hr tablet Take 150 mg by mouth daily before breakfast.    . folic acid (FOLVITE) 1 MG tablet Take 1 mg by mouth daily.    . furosemide (LASIX) 20 MG tablet Take 40 mg by mouth daily.    . isosorbide mononitrate (IMDUR) 30 MG 24 hr tablet Take 0.5 tablets (15 mg total) by mouth daily. 15 tablet 3  . lapatinib (TYKERB) 250 MG tablet Take 3 tablets (750 mg total) by mouth daily. Take on an empty stomach, 1 hour before or 2 hours after food. 90 tablet 6  . lidocaine-prilocaine (EMLA) cream Apply 1 application topically as needed. (Patient taking differently: Apply 1 application topically as needed (for pain). ) 30 g 0  . omeprazole (PRILOSEC) 40 MG capsule Take 40 mg by mouth every morning.    . Potassium Chloride ER 20 MEQ TBCR Take 20 mg by mouth daily at 2 PM. 30 tablet 6   No current facility-administered medications for this encounter.     Allergies  Allergen Reactions  . Duloxetine Other (See Comments)    UNSPECIFIED REACTION   . Nsaids Rash  . Oseltamivir Diarrhea and Other (See Comments)    Abdominal pain       Social History   Social History  . Marital status: Married    Spouse name: N/A  . Number of children: N/A  . Years of education: N/A   Occupational History  . Not on file.   Social History Main Topics  . Smoking status: Never Smoker  . Smokeless tobacco: Never Used  . Alcohol use No  . Drug use: No  . Sexual activity: Not on file   Other Topics Concern  . Not on file   Social History Narrative  . No narrative on file      Family History  Problem Relation Age of  Onset  . Hypertension Mother   . Stroke Mother   . Breast cancer Mother 69       died at 24  . Hypertension Father   . Heart attack Father        died at 37  . Hypertension Brother        MI  . Prostate cancer Brother 56  . Hypertension Brother        MI  . Lung cancer Brother        possible  lung cancer, spot found on lung being monitored  . Hypertension Brother        MI  . Non-Hodgkin's lymphoma Sister 30  . Breast cancer Other 109       is currently 90  . Breast cancer Other 55  . Cancer Cousin 62       type of cancer unk.   . Cancer Other        type unknown, eye/face   . Cancer Other 65       died from cancer in his 46's, type unknown   Vitals:   09/23/17 1534  BP: 129/62  Pulse: 75  SpO2: 98%  Weight: 258 lb (117 kg)   Physical Exam    General: NAD Neck: No JVD, no thyromegaly or thyroid nodule.  Lungs: Clear to auscultation bilaterally with normal respiratory effort. CV: Nondisplaced PMI.  Heart regular S1/S2, no S3/S4, no murmur.  No peripheral edema.  No carotid bruit.  Normal pedal pulses.  Abdomen: Soft, nontender, no hepatosplenomegaly, no distention.  Skin: Intact without lesions or rashes.  Neurologic: Alert and oriented x 3.  Psych: Normal affect. Extremities: No clubbing or cyanosis.  HEENT: Normal.   Assessment/Plan    1. CAD: LHC (10/18) with 70% ostial D2 stenosis at a trifurcation (2 medium-sized diagonals come off in very close proximity from the proximal LAD).  It is possible that this lesion could lead to chest pain with exertion.  Given the position of the stenosis and location in a branch vessel that is not large, I elected to treat medically.  Imdur 30 mg was started.  She has had significant less chest pain.  She is likely getting a headache from Imdur.  - Continue ASA 81 daily.  - Cut Imdur back to 15 mg daily with headaches.  - Continue atenolol. - She is on atorvastatin.  2. Chronic diastolic CHF: Echo 99/14 with EF 60-65%, mild MR,  PASP 30 mmHg.  She is now on Lasix 40 mg daily and euvolemic on exam. - Continue Lasix 40 mg daily.  3. Breast cancer: She has had one dose of Herceptin with a significant reaction after getting the medication.  Current plan is to start lapatinib in 12/18.  This medication also carries risk of cardio-toxicity.  I will arrange for repeat echo and followup in this office in 3/19.  4. Hyperlipidemia: LDL too high in 9/18, increase atorvastatin to 80 mg daily with lipids/LFTs in 2 months, goal LDL < 70.   Loralie Champagne 09/24/2017

## 2017-09-26 ENCOUNTER — Telehealth: Payer: Self-pay | Admitting: Pharmacy Technician

## 2017-09-26 NOTE — Telephone Encounter (Signed)
Oral Oncology Patient Advocate Encounter  Met patient in [facility] lobby to complete application for [employer] Patient [place] in an effort to reduce patient's out of pocket expense for Tykerb to $0.    Currently, the [employer] company has denied the initial prior authorization as well as the first level appeal.   Application for compassionate use supply was completed and faxed to [phone].   [employer] patient assistance phone number for follow up is [phone].   This encounter will be updated until final determination.   Fabio Asa. [name], [facility] [phone] 09/26/2017 1:24 PM

## 2017-10-03 NOTE — Telephone Encounter (Signed)
Oral Oncology Patient Advocate Encounter  Received notification from [employer] Patient Assistance program that patient has been successfully enrolled into their program to receive Tykerb from the manufacturer at $0 out of pocket until 11/25/2017.   I called and spoken with patient.  She knows we will have to re-apply for 2019 enrollment and we have made arrangements for me to get her signature on the application during her December office visit  Patient knows to call the office with questions or concerns.  Oral Oncology Clinic will continue to follow.  Gilmore Laroche, CPhT, [facility] Patient Advocate [phone] 10/03/2017 12:54 PM

## 2017-10-09 MED ORDER — LAPATINIB DITOSYLATE 250 MG PO TABS: 750.0000 mg | ORAL_TABLET | Freq: Every day | ORAL | 6 refills | Status: DC

## 2017-10-09 NOTE — Telephone Encounter (Signed)
Oral Oncology Pharmacist Encounter  Received notification from [employer] Patient Assistance Program that they would need new prescription for Tykerb sent to their manufacturer assistance pharmacy.  Tykerb prescription e-scribed to [facility].  Oral Oncology Clinic will continue to follow.  Johny Drilling, PharmD, BCPS, BCOP 10/09/2017 3:06 PM Oral Oncology Clinic [phone]

## 2017-10-12 ENCOUNTER — Other Ambulatory Visit: Payer: Self-pay | Admitting: Oncology

## 2017-10-15 ENCOUNTER — Ambulatory Visit: Payer: Medicare Other | Admitting: Family Medicine

## 2017-10-30 NOTE — Progress Notes (Signed)
[place]  [phone] [phone]     ID: Darrick Penna DOB: 12/19/1940  MR#: [id number]  [facility]#:[id number]  Patient Care Team: Burman Freestone, MD as PCP - General (Internal Medicine) Excell Seltzer, MD as Consulting Physician (General Surgery) Magrinat, Virgie Dad, MD as Consulting Physician (Oncology) Gery Pray, MD as Consulting Physician (Radiation Oncology) [place] Crane, MD as Referring Physician (Internal Medicine) Bensimhon, Shaune Pascal, MD as Consulting Physician (Cardiology) OTHER MD:  CHIEF COMPLAINT: Estrogen receptor positive breast cancer  CURRENT TREATMENT: Awaiting definitive surgery   BREAST CANCER HISTORY: From the original intake note:  "Kiara Brown" herself noted a change around her nipple on the right and brought it to medical attention. She was set up for bilateral diagnostic mammography with tomography and right breast ultrasonography at [facility] 05/23/2017. The breast density was category C. In the right breast upper inner quadrant there was a 2.5 cm area of grouped pleomorphic calcifications correlating with the palpable mass. Also there was a 1.2 cm macrolobulated mass in the upper inner quadrant also in the palpable region. These masses were identifiable by ultrasound with the same dimensions. The right axilla was sonographically negative.  On 05/27/2017 the patient underwent biopsy of the 2 masses in question. 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  The patient's subsequent history is as detailed below.  INTERVAL  HISTORY: Kiara Brown is here today for evaluation of triple positive breast cancer.  She is taking Anastrozole daily and is tolerating it well.  She continues to work towards regaining her strength.  She is not receiving Trastuzumab anymore, however will need anti HER-2 therapy with Lapatinib.  We are evaluating whether or not she can proceed with that today.    REVIEW OF SYSTEMS: Kiara Brown is doing moderately well.  Her functional status has improved greatly. She is doing most things independently.  She however, has not been able to cook, or grocery shop like she used to.  She gets to fatigue.  Dyspnea on exertion is a main problem for her as well.  She follows up with Dr. Aundra Dubin in cardiology.  Otherwise, a detailed ROS is non contributory.    PAST MEDICAL HISTORY: Past Medical History:  Diagnosis Date  . Arthritis    osteo, rheumatoid  . Breast cancer ([place])   . Diverticulosis   . Dysrhythmia    hx rapid heartbeat, no cardiologist  . Family history of breast cancer   . Family history of prostate cancer   . Fibromyalgia    peripheral neuropathy  . Fibromyalgia   . GERD (gastroesophageal reflux disease)   . H/O hiatal hernia   . Hyperlipidemia   . PONV (postoperative nausea and vomiting)    "THROAT WITH EXTREME KGURKYH"0623, ulnar nerve surgery  . Sciatica   . Shortness of breath dyspnea    with exertion  . UTI (lower urinary tract infection)     PAST SURGICAL HISTORY: Past Surgical History:  Procedure Laterality Date  . ABDOMINAL HYSTERECTOMY    . APPENDECTOMY    . back injection      July 2018- x1  . BREAST SURGERY Left  1986   biopsy, Recent 05/2017 at [facility]- had biopsy on R breast- malignant   . CARDIAC CATHETERIZATION  2009   "no blockage per pt"  . CHOLECYSTECTOMY    . CYSTOSCOPY WITH BIOPSY    . INCONTINENCE SURGERY    . JOINT REPLACEMENT    . KNEE ARTHROSCOPY     bilateral  . KNEE ARTHROSCOPY Left 03/17/2014   Procedure: ARTHROSCOPY LEFT KNEE WITH SYNOVECTOMY;  Surgeon:  Gearlean Alf, MD;  Location: [facility];  Service: Orthopedics;  Laterality: Left;  . PORTACATH PLACEMENT Left 07/02/2017   Procedure: INSERTION PORT-A-CATH;  Surgeon: Excell Seltzer, MD;  Location: [facility];  Service: General;  Laterality: Left;  . RIGHT/LEFT HEART CATH AND CORONARY ANGIOGRAPHY N/A 08/30/2017   Procedure: RIGHT/LEFT HEART CATH AND CORONARY ANGIOGRAPHY;  Surgeon: Larey Dresser, MD;  Location: [facility];  Service: Cardiovascular;  Laterality: N/A;  . ROTATOR CUFF REPAIR     right  . SIMPLE MASTECTOMY WITH AXILLARY SENTINEL NODE BIOPSY Right 07/02/2017   Procedure: RIGHT TOTAL  MASTECTOMY WITH AXILLARY SENTINEL LYMPH  NODE BIOPSY;  Surgeon: Excell Seltzer, MD;  Location: [facility];  Service: General;  Laterality: Right;  . TOTAL KNEE ARTHROPLASTY  08/04/2012   Procedure: TOTAL KNEE ARTHROPLASTY;  Surgeon: Gearlean Alf, MD;  Location: [facility];  Service: Orthopedics;  Laterality: Left;  . TOTAL KNEE ARTHROPLASTY Right 04/11/2015   Procedure: RIGHT TOTAL KNEE ARTHROPLASTY;  Surgeon: Gaynelle Arabian, MD;  Location: [facility];  Service: Orthopedics;  Laterality: Right;  . ulnar nerve surgery Right 1986    FAMILY HISTORY Family History  Problem Relation Age of Onset  . Hypertension Mother   . Stroke Mother   . Breast cancer Mother 64       died at 38  . Hypertension Father   . Heart attack Father        died at 14  . Hypertension Brother        MI  . Prostate cancer Brother 15  . Hypertension Brother        MI  . Lung cancer Brother        possible lung cancer, spot found on lung being monitored  . Hypertension Brother        MI  . Non-Hodgkin's lymphoma Sister 45  . Breast cancer Other 22       is currently 90  . Breast cancer Other 55  . Cancer Cousin 62       type of cancer unk.   . Cancer Other        type unknown, eye/face   . Cancer Other 66       died from cancer in his 47's, type unknown   The patient's father died from a heart attack at age 30. The  patient's mother was diagnosed with breast cancer at age 19 and died at age 47. The patient had 5 brothers, 3 sisters. One sister had breast cancer at age 38. One brother was diagnosed with prostate cancer at age 56. One Brother died from lung cancer at age 64. One sister had non-Hodgkin's lymphoma diagnosed at age 88.   GYNECOLOGIC HISTORY:  No LMP recorded. Patient has had a hysterectomy.  menarche age 68, first live birth age 72. All 3 of her births were premature including 1 set of twins. One of the twins died a few hours after birth. She underwent hysterectomy in 1981 with unilateral salpingo-oophorectomy. 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 Her husband, Berneta Sages began a business installing fences which is now carried on by his son Dorothea Ogle. Berneta Sages is also the church organist. Son, Darnelle Maffucci is a truck Geophysicist/field seismologist in [employer]. The patient has one grandchild. She is a Tourist information centre manager.     ADVANCED DIRECTIVES: In place. The patient's husband is her healthcare power of attorney   HEALTH MAINTENANCE: Social History   Tobacco Use  . Smoking status: Never Smoker  . Smokeless tobacco: Never Used  Substance Use Topics  . Alcohol use: No  . Drug use: No     Colonoscopy:2017 / [place]   PAP:  Bone density:June 2014, normal    Allergies  Allergen Reactions  . Duloxetine Other (See Comments)    UNSPECIFIED REACTION   . Nsaids Rash  . Oseltamivir Diarrhea and Other (See Comments)    Abdominal pain     Current Outpatient Medications  Medication Sig Dispense Refill  . acetaminophen (TYLENOL) 500 MG tablet Take 500 mg by mouth every 6 (six) hours as needed for moderate pain or headache.     . albuterol (PROVENTIL HFA;VENTOLIN HFA) 108 (90 Base) MCG/ACT inhaler Inhale 2 puffs into the lungs every 6 (six) hours as needed for wheezing or shortness of breath. 1 Inhaler 2  . amitriptyline (ELAVIL) 10 MG  tablet Take 10 mg by mouth at bedtime.     Marland Kitchen anastrozole (ARIMIDEX) 1 MG tablet Take 1 tablet (1 mg total) by mouth daily. 90 tablet 4  . aspirin EC 81 MG tablet Take 81 mg by mouth daily.    Marland Kitchen atenolol (TENORMIN) 50 MG tablet Take 50 mg by mouth daily before breakfast.     . atorvastatin (LIPITOR) 80 MG tablet Take 1 tablet (80 mg total) by mouth daily. 30 tablet 3  . buPROPion (WELLBUTRIN XL) 150 MG 24 hr tablet Take 150 mg by mouth daily before breakfast.    . folic acid (FOLVITE) 1 MG tablet Take 1 mg by mouth daily.    . furosemide (LASIX) 20 MG tablet Take 40 mg by mouth daily.    . isosorbide mononitrate (IMDUR) 30 MG 24 hr tablet Take 0.5 tablets (15 mg total) by mouth daily. 15 tablet 3  . lapatinib (TYKERB) 250 MG tablet Take 3 tablets (750 mg total) daily by mouth. Take on an empty stomach, 1 hour before or 2 hours after food. 90 tablet 6  . omeprazole (PRILOSEC) 40 MG capsule Take 40 mg by mouth every morning.    . Potassium Chloride ER 20 MEQ TBCR Take 20 mg by mouth daily at 2 PM. 30 tablet 6  . ondansetron (ZOFRAN) 8 MG tablet Take 1 tablet (8 mg total) by mouth every 8 (eight) hours as needed for nausea or vomiting. 20 tablet 0   No current facility-administered medications for this visit.     OBJECTIVE:   Vitals:   10/31/17 1138  BP: 135/85  Pulse: 74  Resp: 18  Temp: 98.3 F (36.8 C)  SpO2: 99%     Body mass index is 38.82 kg/m.  Domenick Gong Weights   10/31/17 1138  Weight: 255 lb 4.8 oz (115.8 kg)     ECOG FS:2 - Symptomatic, <50% confined to bed GENERAL: Patient is a well appearing female in no acute distress HEENT:  Sclerae anicteric.  Oropharynx clear and moist. No ulcerations or evidence of oropharyngeal candidiasis. Neck is supple.  NODES:  No cervical, supraclavicular, or axillary lymphadenopathy palpated.  BREAST EXAM:  Deferred. LUNGS:  Clear to auscultation bilaterally.  No wheezes or rhonchi. HEART:  Regular rate and rhythm. No murmur  appreciated. ABDOMEN:  Soft, nontender.  Positive, normoactive bowel sounds. No organomegaly palpated. MSK:  No focal spinal tenderness to palpation. Full range of motion bilaterally in the upper extremities. EXTREMITIES:  No peripheral edema.   SKIN:  Clear with no obvious rashes or skin changes. No nail dyscrasia. NEURO:  Nonfocal. Well oriented.  Appropriate affect.    LAB RESULTS:  CMP     Component Value Date/Time   NA 131 (L) 09/06/2017 1018   K 4.2 09/06/2017 1018   CL 97 (L) 08/27/2017 1532   CO2 25 09/06/2017 1018   GLUCOSE 187 (H) 09/06/2017 1018   BUN 11.2 09/06/2017 1018   CREATININE 1.2 (H) 09/06/2017 1018   CALCIUM 9.3 09/06/2017 1018   PROT 6.9 09/06/2017 1018   ALBUMIN 3.4 (L) 09/06/2017 1018   AST 20 09/06/2017 1018   ALT 14 09/06/2017 1018   ALKPHOS 103 09/06/2017 1018   BILITOT 0.57 09/06/2017 1018   GFRNONAA 56 (L) 08/27/2017 1532   GFRAA >60 08/27/2017 1532    No results found for: TOTALPROTELP, ALBUMINELP, A1GS, A2GS, BETS, BETA2SER, GAMS, MSPIKE, SPEI  No results found for: KPAFRELGTCHN, LAMBDASER, KAPLAMBRATIO  Lab Results  Component Value Date   WBC 6.9 09/06/2017   NEUTROABS 4.3 09/06/2017   HGB 10.1 (L) 09/06/2017   HCT 30.9 (L) 09/06/2017   MCV 81.5 09/06/2017   PLT 328 09/06/2017      Chemistry      Component Value Date/Time   NA 131 (L) 09/06/2017 1018   K 4.2 09/06/2017 1018   CL 97 (L) 08/27/2017 1532   CO2 25 09/06/2017 1018   BUN 11.2 09/06/2017 1018   CREATININE 1.2 (H) 09/06/2017 1018      Component Value Date/Time   CALCIUM 9.3 09/06/2017 1018   ALKPHOS 103 09/06/2017 1018   AST 20 09/06/2017 1018   ALT 14 09/06/2017 1018   BILITOT 0.57 09/06/2017 1018       No results found for: LABCA2  No components found for: [id number]  No results for input(s): INR in the last 168 hours.  Urinalysis    Component Value Date/Time   COLORURINE STRAW (A) 08/10/2017 1954   APPEARANCEUR CLEAR 08/10/2017 1954   LABSPEC 1.004  (L) 08/10/2017 1954   PHURINE 6.0 08/10/2017 1954   GLUCOSEU NEGATIVE 08/10/2017 1954   HGBUR SMALL (A) 08/10/2017 [place] NEGATIVE 08/10/2017 [place] NEGATIVE 08/10/2017 1954   PROTEINUR NEGATIVE 08/10/2017 1954   UROBILINOGEN 0.2 04/06/2015 1332   NITRITE NEGATIVE 08/10/2017 1954   LEUKOCYTESUR LARGE (A) 08/10/2017 1954     STUDIES: No results found.   ELIGIBLE FOR AVAILABLE RESEARCH PROTOCOL: No  ASSESSMENT: 76 y.o. [place] woman status post right breast upper inner quadrant biopsy 2 on 05/27/2017 showing a clinical T1c N0, stage IA invasive ductal carcinoma, grade 3, estrogen and progesterone receptor positive, HER-2 amplified, with an MIB-1 of 60%  (1) genetics testing 07/14/2017 through the Multi-Cancer panel + Breast Cancer Panel found no deleterious mutations in  ALK, APC,AKT1, ATM, AXIN2,BAP1,  BARD1, BLM, BMPR1A, BRCA1, BRCA2, BRIP1, CASR, CDC73, CDH1, CDK4, CDKN1B, CDKN1C, CDKN2A (p14ARF), CDKN2A (p16INK4a), CEBPA, CHEK2, CTNNA1, DICER1, DIS3L2, EGFR (c.2369C>T, p.Thr790Met variant only), EPCAM (Deletion/duplication testing only), FH, FAM175A, FLCN, GATA2, GPC3, GREM1 (Promoter region deletion/duplication testing only), HOXB13 (c.251G>A, p.Gly84Glu), HRAS, KIT,  MAX, MEN1, MET, MITF (c.952G>A, p.Glu318Lys variant only), MLH1, MSH2, MSH3, MSH6, MUTYH, NBN, NF1, NF2, NTHL1, PALB2, PDGFRA, PHOX2B, PMS2, POLD1, POLE, POT1, PRKAR1A, PTCH1, PTEN, RAD50, RAD51C, RAD51D, RB1, RECQL4, RET, RUNX1, SDHAF2, SDHA (sequence changes only), SDHB, SDHC, SDHD, SMAD4, SMARCA4, SMARCB1, SMARCE1, STK11, SUFU, TERC, TERT, TMEM127, TP53, TSC1, TSC2, VHL, WRN, WT1, FANCC,MRE11,PIK3CA,RINT1,XRCC2.  (a)  3 Variants of Uncertain significance (VUS) were identified.  A Variant of uncertain significance (VUS) in BRCA1 c.2551G>A (p.Glu851Lys).  One VUS was found in CHEK2 c.679G>A (p.Gly227Arg).  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  Another VUS was identified in [place]  c.704T>C (p.Ile235Thr).  The date of this test report is 07/14/2017.    (2) anastrozole started 06/05/2017, to be continued and minimum of 5 years   (3) status post right mastectomy and sentinel lymph node sampling 07/02/2017 for an mpT2 pN0, stage IB invasive ductal carcinoma, grade 3, with negative margins.  (a) the patient opted against reconstruction  (4) trastuzumab for 6 months to started 08/09/2017, discontinued after one dose with significant reaction  (a) baseline echocardiogram 06/19/2017 shows an excellent ejection fraction  (5) to start lapatinib January 2019  PLAN: Kiara Brown is improving, however she is not back to her baseline.  She met with Johny Drilling today and they reviewed the Lapatinib in detail.  She will see Dr. Aundra Dubin in March, 2019.  However, she is not yet quite back to her baseline.  Due to this we will wait another month before starting the Lapatinib.  She will continue on Anastrozole however.  She and I reviewed her activity level.  I encouraged her to start walking slowly, for just a few minutes at a time, 3-5 minutes, and every couple of days to add a minute to her walk.  She says she will try this.  I recommended she review with Dr. Aundra Dubin and ensure this plan is ok.    We will see Kiara Brown back in 4 weeks.   She knows to call for any problems that may develop before that visit.  A total of (30) minutes of face-to-face time was spent with this patient with greater than 50% of that time in counseling and care-coordination.   Wilber Bihari, NP  10/31/17 4:55 PM Medical Oncology and Hematology [facility] [address] [place],  [address] Tel. [phone]    Fax. [phone]

## 2017-10-31 ENCOUNTER — Telehealth: Payer: Self-pay | Admitting: Pharmacist

## 2017-10-31 ENCOUNTER — Encounter: Payer: Self-pay | Admitting: Adult Health

## 2017-10-31 ENCOUNTER — Ambulatory Visit (HOSPITAL_BASED_OUTPATIENT_CLINIC_OR_DEPARTMENT_OTHER): Payer: Medicare Other | Admitting: Adult Health

## 2017-10-31 VITALS — BP 135/85 | HR 74 | Temp 98.3°F | Resp 18 | Ht 68.0 in | Wt 255.3 lb

## 2017-10-31 DIAGNOSIS — Z170 Estrogen receptor positive status [ER+]: Secondary | ICD-10-CM

## 2017-10-31 DIAGNOSIS — C50211 Malignant neoplasm of upper-inner quadrant of right female breast: Secondary | ICD-10-CM | POA: Diagnosis not present

## 2017-10-31 DIAGNOSIS — Z79811 Long term (current) use of aromatase inhibitors: Secondary | ICD-10-CM | POA: Diagnosis not present

## 2017-10-31 MED ORDER — ONDANSETRON HCL 8 MG PO TABS: 8.0000 mg | ORAL_TABLET | Freq: Three times a day (TID) | ORAL | 0 refills | Status: DC | PRN

## 2017-10-31 NOTE — Telephone Encounter (Signed)
Oral Oncology Pharmacist Encounter   I spoke with patient in exam room for overview of new oral chemotherapy medication: Tykerb (lapatinib) for the adjuvant treatment of stage IB, ER/PR+, Her-2 over amplified, breast cancer in conjunction with anastrazole, planned duration 6-12 months.   Pt is doing well. She has received her 1st bottle of Tykerb from the manufacturer.  Counseled patient on administration, dosing, side effects, monitoring, drug-food interactions, safe handling, storage, and disposal.  Patient will take Tykerb '250mg'$  tablets, 3 tablets ('750mg'$ ) by mouth once daily on an empty stomach, 1 hour before or 2 hours after a meal. Patient knows to avoid grapefruit and grapefruit juice. Tykerb start date: pending direction from MD (after 1st of the year)  Side effects include but not limited to: nausea, vomiting, diarrhea, prolonged QTc, rash, fatigue, and hand-foot syndrome.    Reviewed with patient importance of keeping a medication schedule and plan for any missed doses.  Patient counseled about medication interactions as above.  [name] voiced understanding and appreciation.   All questions answered. Medication reconciliation performed and medication/allergy list updated.  Patient signed manufacturer enrollment application for [id number]. This will be updated in a separate encounter  Patient knows to call the office with questions or concerns.  Oral Oncology Clinic will continue to follow.  Johny Drilling, PharmD, BCPS, BCOP 10/31/2017   4:31 PM Oral Oncology Clinic [phone]

## 2017-11-01 ENCOUNTER — Ambulatory Visit: Payer: Medicare Other | Admitting: Adult Health

## 2017-11-01 ENCOUNTER — Telehealth: Payer: Self-pay | Admitting: Oncology

## 2017-11-01 ENCOUNTER — Encounter: Payer: Self-pay | Admitting: Oncology

## 2017-11-01 ENCOUNTER — Telehealth: Payer: Self-pay | Admitting: Pharmacy Technician

## 2017-11-01 NOTE — Telephone Encounter (Signed)
Scheduled appt per 12/6 los - left message for patient regarding appt that was added. Sending confirmation letter as well.

## 2017-11-01 NOTE — Progress Notes (Signed)
Received PA request for Ondansetron(Zofran).  Submitted via Cover My Meds:  Kiara Brown (Key: [id number])   This request is still being processed. You may close this dialog, return to your dashboard, and perform other tasks. To check for an update later, open this request again from your dashboard. If you have any questions please contact [facility] at [phone].

## 2017-11-01 NOTE — Telephone Encounter (Signed)
Oral Oncology Patient Advocate Encounter  Met patient in exam room to complete a renewal application for [employer] Patient [place] (NPAF) in an effort to keep the patient's out of pocket expense for Tykerb at $0.    Application completed and faxed to [phone].   NPAF phone number for follow up is [phone].   This encounter will be updated until final determination.   Fabio Asa. [name], [place] [facility] [place] [phone] 11/01/2017 4:10 PM

## 2017-11-01 NOTE — Progress Notes (Signed)
Received PA determination from [place] for Ondansetron.  PA approved 11/01/17-10/31/17.

## 2017-11-08 DIAGNOSIS — Z472 Encounter for removal of internal fixation device: Secondary | ICD-10-CM | POA: Diagnosis not present

## 2017-11-08 DIAGNOSIS — Z452 Encounter for adjustment and management of vascular access device: Secondary | ICD-10-CM | POA: Diagnosis not present

## 2017-11-27 ENCOUNTER — Ambulatory Visit (HOSPITAL_COMMUNITY)
Admission: RE | Admit: 2017-11-27 | Discharge: 2017-11-27 | Disposition: A | Discharge status: Home / Self Care | Payer: Medicare Other | Source: Ambulatory Visit | Attending: Cardiology | Admitting: Cardiology

## 2017-11-27 DIAGNOSIS — C50911 Malignant neoplasm of unspecified site of right female breast: Secondary | ICD-10-CM

## 2017-11-27 DIAGNOSIS — I10 Essential (primary) hypertension: Secondary | ICD-10-CM | POA: Diagnosis not present

## 2017-11-27 LAB — HEPATIC FUNCTION PANEL
ALBUMIN: 3.3 g/dL — AB (ref 3.5–5.0)
ALK PHOS: 100 U/L (ref 38–126)
ALT: 16 U/L (ref 14–54)
AST: 24 U/L (ref 15–41)
BILIRUBIN TOTAL: 0.4 mg/dL (ref 0.3–1.2)
Total Protein: 7 g/dL (ref 6.5–8.1)

## 2017-11-27 LAB — LIPID PANEL
Cholesterol: 189 mg/dL (ref 0–200)
HDL: 42 mg/dL (ref >40)
LDL CALC: 87 mg/dL (ref 0–99)
TRIGLYCERIDES: 302 mg/dL — AB (ref <150)
Total CHOL/HDL Ratio: 4.5 RATIO
VLDL: 60 mg/dL — AB (ref 0–40)

## 2017-11-27 NOTE — Addendum Note (Signed)
Encounter addended by: Effie Berkshire, RN on: 11/27/2017 2:02 PM  Actions taken: Visit diagnoses modified, Order list changed, Diagnosis association updated

## 2017-11-28 ENCOUNTER — Encounter (HOSPITAL_COMMUNITY): Payer: Self-pay

## 2017-11-28 ENCOUNTER — Other Ambulatory Visit (HOSPITAL_COMMUNITY): Payer: Self-pay

## 2017-11-29 ENCOUNTER — Other Ambulatory Visit: Payer: Self-pay | Admitting: *Deleted

## 2017-11-29 DIAGNOSIS — C50211 Malignant neoplasm of upper-inner quadrant of right female breast: Secondary | ICD-10-CM

## 2017-11-29 DIAGNOSIS — Z170 Estrogen receptor positive status [ER+]: Principal | ICD-10-CM

## 2017-11-29 NOTE — Progress Notes (Signed)
[place]  [phone] [phone]     ID: Kiara Brown DOB: 1940-12-23  MR#: [id number]  WYO#:[id number]  Patient Care Team: Burman Freestone, MD as PCP - General (Internal Medicine) Excell Seltzer, MD as Consulting Physician (General Surgery) Magrinat, Virgie Dad, MD as Consulting Physician (Oncology) Gery Pray, MD as Consulting Physician (Radiation Oncology) [place] Crane, MD as Referring Physician (Internal Medicine) Bensimhon, Shaune Pascal, MD as Consulting Physician (Cardiology) OTHER MD:  CHIEF COMPLAINT: Estrogen receptor positive breast cancer  CURRENT TREATMENT:anastrozole, lapatinib   BREAST CANCER HISTORY: From the original intake note:  "Kiara Brown" herself noted a change around her nipple on the right and brought it to medical attention. She was set up for bilateral diagnostic mammography with tomography and right breast ultrasonography at [facility] 05/23/2017. The breast density was category C. In the right breast upper inner quadrant there was a 2.5 cm area of grouped pleomorphic calcifications correlating with the palpable mass. Also there was a 1.2 cm macrolobulated mass in the upper inner quadrant also in the palpable region. These masses were identifiable by ultrasound with the same dimensions. The right axilla was sonographically negative.  On 05/27/2017 the patient underwent biopsy of the 2 masses in question. 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  The patient's subsequent history is as detailed below.  INTERVAL  HISTORY: Kiara Brown is here today for evaluation and treatment of her triple positive breast cancer. She continues on anastrozole, with good tolerance. She notes some occasional hot flashes that aren't intense and do not occur everyday. She also notes issues with vaginal dryness that she will see Earlie Counts in our pelvic health program later this week   REVIEW OF SYSTEMS: Kiara Brown reports that she is doing more than she did in the last few months. She notes that her strength is returning. She notes that she is easily tired and gets SOB if she walks for too long. She reports that on Sunday, she went to church, ate, and rested. For exercise, she tries to walk. She denies unusual headaches, visual changes, nausea, vomiting, or dizziness. There has been no unusual cough, phlegm production, or pleurisy. This been no change in bowel or bladder habits. She denies unexplained fatigue or unexplained weight loss, bleeding, rash, or fever. A detailed review of systems was otherwise stable.    PAST MEDICAL HISTORY: Past Medical History:  Diagnosis Date  . Arthritis    osteo, rheumatoid  . Breast cancer ([place])   . Diverticulosis   . Dysrhythmia    hx rapid heartbeat, no cardiologist  . Family history of breast cancer   . Family history of prostate cancer   . Fibromyalgia    peripheral neuropathy  . Fibromyalgia   . GERD (gastroesophageal reflux disease)   . H/O hiatal hernia   . Hyperlipidemia   . PONV (postoperative nausea and vomiting)    "THROAT WITH EXTREME DXAJOIN"8676, ulnar nerve surgery  . Sciatica   . Shortness of breath dyspnea    with exertion  . UTI (lower urinary tract infection)     PAST SURGICAL HISTORY: Past Surgical History:  Procedure Laterality Date  . ABDOMINAL HYSTERECTOMY    . APPENDECTOMY    . back injection      July 2018- x1  . BREAST SURGERY Left 1986   biopsy, Recent 05/2017 at [facility]- had biopsy on R breast- malignant   . CARDIAC CATHETERIZATION  2009   "no blockage  per pt"  . CHOLECYSTECTOMY    . CYSTOSCOPY WITH BIOPSY    . INCONTINENCE SURGERY    . JOINT REPLACEMENT    . KNEE ARTHROSCOPY     bilateral  . KNEE ARTHROSCOPY Left 03/17/2014   Procedure: ARTHROSCOPY LEFT KNEE WITH SYNOVECTOMY;  Surgeon: Gearlean Alf, MD;  Location: [facility];  Service: Orthopedics;  Laterality: Left;  . PORTACATH PLACEMENT Left 07/02/2017   Procedure: INSERTION PORT-A-CATH;  Surgeon: Excell Seltzer, MD;  Location: [place];  Service: General;  Laterality: Left;  . RIGHT/LEFT HEART CATH AND CORONARY ANGIOGRAPHY N/A 08/30/2017   Procedure: RIGHT/LEFT HEART CATH AND CORONARY ANGIOGRAPHY;  Surgeon: Larey Dresser, MD;  Location: [place] [facility];  Service: Cardiovascular;  Laterality: N/A;  . ROTATOR CUFF REPAIR     right  . SIMPLE MASTECTOMY WITH AXILLARY SENTINEL NODE BIOPSY Right 07/02/2017   Procedure: RIGHT TOTAL  MASTECTOMY WITH AXILLARY SENTINEL LYMPH  NODE BIOPSY;  Surgeon: Excell Seltzer, MD;  Location: [place];  Service: General;  Laterality: Right;  . TOTAL KNEE ARTHROPLASTY  08/04/2012   Procedure: TOTAL KNEE ARTHROPLASTY;  Surgeon: Gearlean Alf, MD;  Location: [facility];  Service: Orthopedics;  Laterality: Left;  . TOTAL KNEE ARTHROPLASTY Right 04/11/2015   Procedure: RIGHT TOTAL KNEE ARTHROPLASTY;  Surgeon: Gaynelle Arabian, MD;  Location: [facility];  Service: Orthopedics;  Laterality: Right;  . ulnar nerve surgery Right 1986    FAMILY HISTORY Family History  Problem Relation Age of Onset  . Hypertension Mother   . Stroke Mother   . Breast cancer Mother 42       died at 40  . Hypertension Father   . Heart attack Father        died at 63  . Hypertension Brother        MI  . Prostate cancer Brother 75  . Hypertension Brother        MI  . Lung cancer Brother        possible lung cancer, spot found on lung being monitored  . Hypertension Brother        MI  . Non-Hodgkin's lymphoma Sister 60  . Breast cancer Other 73       is currently 90  . Breast cancer  Other 55  . Cancer Cousin 62       type of cancer unk.   . Cancer Other        type unknown, eye/face   . Cancer Other 34       died from cancer in his 22's, type unknown   The patient's father died from a heart attack at age 63. The patient's mother was diagnosed with breast cancer at age 53 and died at age 63. The patient had 5 brothers, 3 sisters. One sister had breast cancer at age 35. One brother was diagnosed with prostate cancer at age 21. One Brother died from lung cancer at age 52. One sister had non-Hodgkin's lymphoma diagnosed at age 70.   GYNECOLOGIC HISTORY:  No LMP recorded. Patient has had a hysterectomy.  menarche age 12, first live birth age 63. All 3 of her births were premature  including 1 set of twins. One of the twins died a few hours after birth. She underwent hysterectomy in 1981 with unilateral salpingo-oophorectomy. 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  She has led groups as far away as [place] and [place] and has led many in the [place] cruises as well.  Her husband, Berneta Sages began a business installing fences which is now carried on by his son Dorothea Ogle. Berneta Sages is also the church organist. Son, Darnelle Maffucci is a truck Geophysicist/field seismologist in [employer]. The patient has one grandchild. She is a Tourist information centre manager.     ADVANCED DIRECTIVES: In place. The patient's husband is her healthcare power of attorney   HEALTH MAINTENANCE: Social History   Tobacco Use  . Smoking status: Never Smoker  . Smokeless tobacco: Never Used  Substance Use Topics  . Alcohol use: No  . Drug use: No     Colonoscopy:2017 / [place]   PAP:  Bone density:June 2014, normal    Allergies  Allergen Reactions  . Duloxetine Other (See Comments)    UNSPECIFIED REACTION   . Nsaids Rash  . Oseltamivir Diarrhea and Other (See Comments)    Abdominal pain     Current Outpatient Medications   Medication Sig Dispense Refill  . acetaminophen (TYLENOL) 500 MG tablet Take 500 mg by mouth every 6 (six) hours as needed for moderate pain or headache.     . albuterol (PROVENTIL HFA;VENTOLIN HFA) 108 (90 Base) MCG/ACT inhaler Inhale 2 puffs into the lungs every 6 (six) hours as needed for wheezing or shortness of breath. 1 Inhaler 2  . amitriptyline (ELAVIL) 10 MG tablet Take 10 mg by mouth at bedtime.     Marland Kitchen anastrozole (ARIMIDEX) 1 MG tablet Take 1 tablet (1 mg total) by mouth daily. 90 tablet 4  . aspirin EC 81 MG tablet Take 81 mg by mouth daily.    Marland Kitchen atenolol (TENORMIN) 50 MG tablet Take 50 mg by mouth daily before breakfast.     . atorvastatin (LIPITOR) 80 MG tablet Take 1 tablet (80 mg total) by mouth daily. 30 tablet 3  . buPROPion (WELLBUTRIN XL) 150 MG 24 hr tablet Take 150 mg by mouth daily before breakfast.    . folic acid (FOLVITE) 1 MG tablet Take 1 mg by mouth daily.    . furosemide (LASIX) 20 MG tablet Take 40 mg by mouth daily.    . isosorbide mononitrate (IMDUR) 30 MG 24 hr tablet Take 0.5 tablets (15 mg total) by mouth daily. 15 tablet 3  . lapatinib (TYKERB) 250 MG tablet Take 3 tablets (750 mg total) daily by mouth. Take on an empty stomach, 1 hour before or 2 hours after food. 90 tablet 6  . omeprazole (PRILOSEC) 40 MG capsule Take 40 mg by mouth every morning.    . ondansetron (ZOFRAN) 8 MG tablet Take 1 tablet (8 mg total) by mouth every 8 (eight) hours as needed for nausea or vomiting. 20 tablet 0  . Potassium Chloride ER 20 MEQ TBCR Take 20 mg by mouth daily at 2 PM. 30 tablet 6   No current facility-administered medications for this visit.     OBJECTIVE: Middle-aged white woman who appears stated age  12:   12/02/17 1343  BP: (!) 128/53  Pulse: 71  Resp: 20  Temp: 97.9 F (36.6 C)  SpO2: 100%     Body mass index  is 39.08 kg/m.  Domenick Gong Weights   12/02/17 1343  Weight: 257 lb (116.6 kg)     ECOG FS:1 - Symptomatic but completely  ambulatory  Sclerae unicteric, pupils round and equal Oropharynx clear and moist No cervical or supraclavicular adenopathy Lungs no rales or rhonchi Heart regular rate and rhythm Abd soft, obese, nontender, positive bowel sounds MSK no focal spinal tenderness, no upper extremity lymphedema Neuro: nonfocal, well oriented, appropriate affect Breasts:     LAB RESULTS:  CMP     Component Value Date/Time   NA 131 (L) 09/06/2017 1018   K 4.2 09/06/2017 1018   CL 97 (L) 08/27/2017 1532   CO2 25 09/06/2017 1018   GLUCOSE 187 (H) 09/06/2017 1018   BUN 11.2 09/06/2017 1018   CREATININE 1.2 (H) 09/06/2017 1018   CALCIUM 9.3 09/06/2017 1018   PROT 7.0 11/27/2017 1421   PROT 6.9 09/06/2017 1018   ALBUMIN 3.3 (L) 11/27/2017 1421   ALBUMIN 3.4 (L) 09/06/2017 1018   AST 24 11/27/2017 1421   AST 20 09/06/2017 1018   ALT 16 11/27/2017 1421   ALT 14 09/06/2017 1018   ALKPHOS 100 11/27/2017 1421   ALKPHOS 103 09/06/2017 1018   BILITOT 0.4 11/27/2017 1421   BILITOT 0.57 09/06/2017 1018   GFRNONAA 56 (L) 08/27/2017 1532   GFRAA >60 08/27/2017 1532    No results found for: TOTALPROTELP, ALBUMINELP, A1GS, A2GS, BETS, BETA2SER, GAMS, MSPIKE, SPEI  No results found for: Nils Pyle, KAPLAMBRATIO  Lab Results  Component Value Date   WBC 8.8 12/02/2017   NEUTROABS 5.5 12/02/2017   HGB 10.2 (L) 12/02/2017   HCT 31.4 (L) 12/02/2017   MCV 80.6 12/02/2017   PLT 330 12/02/2017      Chemistry      Component Value Date/Time   NA 131 (L) 09/06/2017 1018   K 4.2 09/06/2017 1018   CL 97 (L) 08/27/2017 1532   CO2 25 09/06/2017 1018   BUN 11.2 09/06/2017 1018   CREATININE 1.2 (H) 09/06/2017 1018      Component Value Date/Time   CALCIUM 9.3 09/06/2017 1018   ALKPHOS 100 11/27/2017 1421   ALKPHOS 103 09/06/2017 1018   AST 24 11/27/2017 1421   AST 20 09/06/2017 1018   ALT 16 11/27/2017 1421   ALT 14 09/06/2017 1018   BILITOT 0.4 11/27/2017 1421   BILITOT 0.57 09/06/2017  1018       No results found for: LABCA2  No components found for: [id number]  No results for input(s): INR in the last 168 hours.  Urinalysis    Component Value Date/Time   COLORURINE STRAW (A) 08/10/2017 1954   APPEARANCEUR CLEAR 08/10/2017 1954   LABSPEC 1.004 (L) 08/10/2017 1954   PHURINE 6.0 08/10/2017 1954   GLUCOSEU NEGATIVE 08/10/2017 1954   HGBUR SMALL (A) 08/10/2017 [place] NEGATIVE 08/10/2017 [place] NEGATIVE 08/10/2017 1954   PROTEINUR NEGATIVE 08/10/2017 1954   UROBILINOGEN 0.2 04/06/2015 1332   NITRITE NEGATIVE 08/10/2017 1954   LEUKOCYTESUR LARGE (A) 08/10/2017 1954     STUDIES: No results found.   ELIGIBLE FOR AVAILABLE RESEARCH PROTOCOL: No  ASSESSMENT: 77 y.o. [place] woman status post right breast upper inner quadrant biopsy 2 on 05/27/2017 showing a clinical T1c N0, stage IA invasive ductal carcinoma, grade 3, estrogen and progesterone receptor positive, HER-2 amplified, with an MIB-1 of 60%  (1) genetics testing 07/14/2017 through the Multi-Cancer panel + Breast Cancer Panel found no deleterious mutations in  ALK,  APC,AKT1, ATM, AXIN2,BAP1,  BARD1, BLM, BMPR1A, BRCA1, BRCA2, BRIP1, CASR, CDC73, CDH1, CDK4, CDKN1B, CDKN1C, CDKN2A (p14ARF), CDKN2A (p16INK4a), CEBPA, CHEK2, CTNNA1, DICER1, DIS3L2, EGFR (c.2369C>T, p.Thr790Met variant only), EPCAM (Deletion/duplication testing only), FH, FAM175A, FLCN, GATA2, GPC3, GREM1 (Promoter region deletion/duplication testing only), HOXB13 (c.251G>A, p.Gly84Glu), HRAS, KIT, MAX, MEN1, MET, MITF (c.952G>A, p.Glu318Lys variant only), MLH1, MSH2, MSH3, MSH6, MUTYH, NBN, NF1, NF2, NTHL1, PALB2, PDGFRA, PHOX2B, PMS2, POLD1, POLE, POT1, PRKAR1A, PTCH1, PTEN, RAD50, RAD51C, RAD51D, RB1, RECQL4, RET, RUNX1, SDHAF2, SDHA (sequence changes only), SDHB, SDHC, SDHD, SMAD4, SMARCA4, SMARCB1, SMARCE1, STK11, SUFU, TERC, TERT, TMEM127, TP53, TSC1, TSC2, VHL, WRN, WT1, FANCC,MRE11,PIK3CA,RINT1,XRCC2.  (a)  3  Variants of Uncertain significance (VUS) were identified.  BRCA1 c.2551G>A (p.Glu851Lys)----CHEK2 c.679G>A (p.Gly227Arg).  The CHECK 2 variant has been reported as possibly mosaic--- SDHA c.704T>C (p.Ile235Thr).  (2) anastrozole started 06/05/2017, to be continued and minimum of 5 years   (3) status post right mastectomy and sentinel lymph node sampling 07/02/2017 for an mpT2 pN0, stage IB invasive ductal carcinoma, grade 3, with negative margins.  (a) the patient opted against reconstruction  (4) trastuzumab for 6 months started 08/09/2017, discontinued after one dose with significant reaction  (a) baseline echocardiogram 06/19/2017 shows an excellent ejection fraction  (b) Echocardiogram 09/16/2017 showed an ejection fraction of 60-65%  (5) started lapatinib 12/02/2017  PLAN: Kiara Brown had such a horrible and prolonged reaction to trastuzumab that we are going to go very slowly she is only going to take 1 tablet daily for the next week.  Assuming she tolerates that well, she will start 2 tablets daily beginning 12/09/2017.  Again, if that goes well, as of 12/16/2017 she will start 3 tablets daily which is her target dose.  The plan is to continue this for a total of 6 months.  Of course what we are trying to do is to prevent crosstalk between HER-2 and estrogen receptors.  I am going to see her again in approximately 6 weeks just to make sure all is going well.  She is also scheduled already for repeat echocardiogram under Dr. Aundra Dubin.  She knows to call for any other issues that may develop before the next visit.   Magrinat, Virgie Dad, MD  12/02/17 2:20 PM Medical Oncology and Hematology [facility] [address] [place], [place] [address] Tel. [phone]    Fax. [phone]  This document serves as a record of services personally performed by Lurline Del, MD. It was created on his behalf by Sheron Nightingale, a trained medical scribe. The creation of this record is  based on the scribe's personal observations and the provider's statements to them.   I have reviewed the above documentation for accuracy and completeness, and I agree with the above.

## 2017-12-02 ENCOUNTER — Inpatient Hospital Stay: Payer: Medicare Other

## 2017-12-02 ENCOUNTER — Telehealth: Payer: Self-pay | Admitting: Oncology

## 2017-12-02 ENCOUNTER — Inpatient Hospital Stay: Payer: Medicare Other | Attending: Oncology | Admitting: Oncology

## 2017-12-02 VITALS — BP 128/53 | HR 71 | Temp 97.9°F | Resp 20 | Ht 68.0 in | Wt 257.0 lb

## 2017-12-02 DIAGNOSIS — Z79811 Long term (current) use of aromatase inhibitors: Secondary | ICD-10-CM | POA: Diagnosis not present

## 2017-12-02 DIAGNOSIS — C50211 Malignant neoplasm of upper-inner quadrant of right female breast: Secondary | ICD-10-CM

## 2017-12-02 DIAGNOSIS — Z9011 Acquired absence of right breast and nipple: Secondary | ICD-10-CM | POA: Insufficient documentation

## 2017-12-02 DIAGNOSIS — Z170 Estrogen receptor positive status [ER+]: Principal | ICD-10-CM

## 2017-12-02 LAB — COMPREHENSIVE METABOLIC PANEL
ALT: 15 U/L (ref 0–55)
AST: 16 U/L (ref 5–34)
Albumin: 3.3 g/dL — ABNORMAL LOW (ref 3.5–5.0)
Alkaline Phosphatase: 110 U/L (ref 40–150)
Anion gap: 9 (ref 3–11)
BILIRUBIN TOTAL: 0.3 mg/dL (ref 0.2–1.2)
BUN: 15 mg/dL (ref 7–26)
CALCIUM: 9.1 mg/dL (ref 8.4–10.4)
CO2: 26 mmol/L (ref 22–29)
CREATININE: 1.26 mg/dL — AB (ref 0.60–1.10)
Chloride: 99 mmol/L (ref 98–109)
GFR, EST AFRICAN AMERICAN: 47 mL/min — AB (ref >60)
GFR, EST NON AFRICAN AMERICAN: 40 mL/min — AB (ref >60)
Glucose, Bld: 203 mg/dL — ABNORMAL HIGH (ref 70–140)
Potassium: 4 mmol/L (ref 3.3–4.7)
Sodium: 134 mmol/L — ABNORMAL LOW (ref 136–145)
TOTAL PROTEIN: 7.1 g/dL (ref 6.4–8.3)

## 2017-12-02 LAB — CBC WITH DIFFERENTIAL/PLATELET
Abs Granulocyte: 5.5 10*3/uL (ref 1.5–6.5)
BASOS PCT: 1 %
Basophils Absolute: 0 10*3/uL (ref 0.0–0.1)
EOS ABS: 0.1 10*3/uL (ref 0.0–0.5)
EOS PCT: 1 %
HCT: 31.4 % — ABNORMAL LOW (ref 34.8–46.6)
Hemoglobin: 10.2 g/dL — ABNORMAL LOW (ref 11.6–15.9)
Lymphocytes Relative: 28 %
Lymphs Abs: 2.5 10*3/uL (ref 0.9–3.3)
MCH: 26.2 pg (ref 25.1–34.0)
MCHC: 32.6 g/dL (ref 31.5–36.0)
MCV: 80.6 fL (ref 79.5–101.0)
MONO ABS: 0.7 10*3/uL (ref 0.1–0.9)
Monocytes Relative: 8 %
NEUTROS PCT: 62 %
Neutro Abs: 5.5 10*3/uL (ref 1.5–6.5)
PLATELETS: 330 10*3/uL (ref 145–400)
RBC: 3.9 MIL/uL (ref 3.70–5.45)
RDW: 16.7 % — AB (ref 11.2–16.1)
WBC: 8.8 10*3/uL (ref 3.9–10.3)

## 2017-12-02 NOTE — Telephone Encounter (Signed)
Gave patient AVS and calendar of upcoming February appointments.  °

## 2017-12-05 ENCOUNTER — Ambulatory Visit: Payer: Medicare Other | Attending: Oncology | Admitting: Physical Therapy

## 2017-12-05 ENCOUNTER — Encounter: Payer: Self-pay | Admitting: Physical Therapy

## 2017-12-05 DIAGNOSIS — M25511 Pain in right shoulder: Secondary | ICD-10-CM | POA: Insufficient documentation

## 2017-12-05 DIAGNOSIS — M25512 Pain in left shoulder: Secondary | ICD-10-CM | POA: Insufficient documentation

## 2017-12-05 DIAGNOSIS — C50211 Malignant neoplasm of upper-inner quadrant of right female breast: Secondary | ICD-10-CM | POA: Diagnosis not present

## 2017-12-05 DIAGNOSIS — M6281 Muscle weakness (generalized): Secondary | ICD-10-CM | POA: Diagnosis not present

## 2017-12-05 DIAGNOSIS — G8929 Other chronic pain: Secondary | ICD-10-CM | POA: Diagnosis not present

## 2017-12-05 DIAGNOSIS — R2689 Other abnormalities of gait and mobility: Secondary | ICD-10-CM | POA: Diagnosis not present

## 2017-12-05 DIAGNOSIS — Z170 Estrogen receptor positive status [ER+]: Secondary | ICD-10-CM | POA: Insufficient documentation

## 2017-12-05 DIAGNOSIS — R293 Abnormal posture: Secondary | ICD-10-CM | POA: Insufficient documentation

## 2017-12-05 NOTE — Therapy (Signed)
[facility] [address], [place], [place], [address] Phone: [phone]   Fax:  [phone]  Physical Therapy Evaluation  Patient Details  Name: Kiara Brown MRN: [id number] Date of Birth: June 12, 1941 Referring Provider: Dr. Lurline Del   Encounter Date: 12/05/2017  PT End of Session - 12/05/17 1413    Visit Number  1    Date for PT Re-Evaluation  01/30/18    Authorization Type  Medicare BCBS    PT Start Time  1145    PT Stop Time  1230    PT Time Calculation (min)  45 min    Activity Tolerance  Patient tolerated treatment well    Behavior During Therapy  [facility] for tasks assessed/performed       Past Medical History:  Diagnosis Date  . Arthritis    osteo, rheumatoid  . Breast cancer ([place])   . Diverticulosis   . Dysrhythmia    hx rapid heartbeat, no cardiologist  . Family history of breast cancer   . Family history of prostate cancer   . Fibromyalgia    peripheral neuropathy  . Fibromyalgia   . GERD (gastroesophageal reflux disease)   . H/O hiatal hernia   . Hyperlipidemia   . PONV (postoperative nausea and vomiting)    "THROAT WITH EXTREME ERXVQMG"8676, ulnar nerve surgery  . Sciatica   . Shortness of breath dyspnea    with exertion  . UTI (lower urinary tract infection)     Past Surgical History:  Procedure Laterality Date  . ABDOMINAL HYSTERECTOMY    . APPENDECTOMY    . back injection      July 2018- x1  . BREAST SURGERY Left 1986   biopsy, Recent 05/2017 at [facility]- had biopsy on R breast- malignant   . CARDIAC CATHETERIZATION  2009   "no blockage per pt"  . CHOLECYSTECTOMY    . CYSTOSCOPY WITH BIOPSY    . INCONTINENCE SURGERY    . JOINT REPLACEMENT    . KNEE ARTHROSCOPY     bilateral  . KNEE ARTHROSCOPY Left 03/17/2014   Procedure: ARTHROSCOPY LEFT KNEE WITH SYNOVECTOMY;  Surgeon: Gearlean Alf, MD;  Location: [facility];  Service: Orthopedics;  Laterality: Left;  . PORTACATH  PLACEMENT Left 07/02/2017   Procedure: INSERTION PORT-A-CATH;  Surgeon: Excell Seltzer, MD;  Location: [place];  Service: General;  Laterality: Left;  . RIGHT/LEFT HEART CATH AND CORONARY ANGIOGRAPHY N/A 08/30/2017   Procedure: RIGHT/LEFT HEART CATH AND CORONARY ANGIOGRAPHY;  Surgeon: Larey Dresser, MD;  Location: [facility];  Service: Cardiovascular;  Laterality: N/A;  . ROTATOR CUFF REPAIR     right  . SIMPLE MASTECTOMY WITH AXILLARY SENTINEL NODE BIOPSY Right 07/02/2017   Procedure: RIGHT TOTAL  MASTECTOMY WITH AXILLARY SENTINEL LYMPH  NODE BIOPSY;  Surgeon: Excell Seltzer, MD;  Location: [place];  Service: General;  Laterality: Right;  . TOTAL KNEE ARTHROPLASTY  08/04/2012   Procedure: TOTAL KNEE ARTHROPLASTY;  Surgeon: Gearlean Alf, MD;  Location: [facility];  Service: Orthopedics;  Laterality: Left;  . TOTAL KNEE ARTHROPLASTY Right 04/11/2015   Procedure: RIGHT TOTAL KNEE ARTHROPLASTY;  Surgeon: Gaynelle Arabian, MD;  Location: [facility];  Service: Orthopedics;  Laterality: Right;  . ulnar nerve surgery Right 1986    There were no vitals filed for this visit.   Subjective Assessment - 12/05/17 1149    Subjective  Patient is having vaginal dryness. Urinary leakage with coughing hard.     Currently  in Pain?  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 Sarajane Jews Magrinat    Onset Date/Surgical Date  05/27/17    Prior Therapy  none      Precautions   Precautions  Other (comment)    Precaution Comments  Breast cancer      Restrictions   Weight Bearing Restrictions  No      Balance Screen   Has the patient fallen in the past 6 months  No    Has the patient had a decrease in activity level because of a fear of falling?   No    Is the patient reluctant to leave their home because of a fear of  falling?   No      Home Film/video editor residence      Prior Function   Level of Independence  Independent      Cognition   Overall Cognitive Status  Within Functional Limits for tasks assessed      Observation/Other Assessments   Focus on Therapeutic Outcomes (FOTO)   limited by 40% due to balance issues and vaginal dryness that is irritating      Posture/Postural Control   Posture/Postural Control  Postural limitations    Postural Limitations  Forward head;Rounded Shoulders      ROM / Strength   AROM / PROM / Strength  AROM;PROM;Strength      AROM   Lumbar Extension  decreased by 50%      PROM   Overall PROM   Within functional limits for tasks performed      Strength   Left Hip Flexion  4/5    Left Hip Extension  3/5    Left Hip ABduction  4/5      Ambulation/Gait   Gait Pattern  Decreased step length - right;Decreased step length - left will hold ont walls when leaving the room    Gait Comments  Patient reports she has to hold onto the walls when walking due to decreased balance      Standardized Balance Assessment   Standardized Balance Assessment  Berg Balance Test      Berg Balance Test   Sit to Stand  Able to stand without using hands and stabilize independently    Standing Unsupported  Able to stand safely 2 minutes    Sitting with Back Unsupported but Feet Supported on Floor or Stool  Able to sit safely and securely 2 minutes    Stand to Sit  Sits safely with minimal use of hands    Transfers  Able to transfer safely, minor use of hands    Standing Unsupported with Eyes Closed  Able to stand 10 seconds safely    Standing Ubsupported with Feet Together  Able to place feet together independently and stand 1 minute safely    From Standing, Reach Forward with Outstretched Arm  Can reach confidently >25 cm (10")    From Standing Position, Pick up Object from Floor  Able to pick up shoe safely and easily    From Standing Position, Turn to  Look Behind Over each Shoulder  Looks behind from both sides and weight shifts well    Turn 360 Degrees  Able to turn 360 degrees safely  in 4 seconds or less    Standing Unsupported, Alternately Place Feet on Step/Stool  Able to stand independently and safely and complete 8 steps in 20 seconds    Standing Unsupported, One Foot in [employer] balance while stepping or standing    Standing on One Leg  Unable to try or needs assist to prevent fall    Total Score  48    Berg comment:  50% chance of falling      High Level Balance   High Level Balance Comments  unable to stand on one leg             Objective measurements completed on examination: See above findings.    Pelvic Floor Special Questions - 12/05/17 0001    Currently Sexually Active  Yes    Is this Painful  No    Urinary Leakage  Yes    Activities that cause leaking  Coughing    Urinary urgency  No    External Perineal Exam  during pelvic floor contraction decreased excursion of the perineal body    Skin Integrity  Intact;Other dryness    Pelvic Floor Internal Exam  patient will confirm identification and approves PT to assess pelvic floor muscles and treatment    Exam Type  Vaginal    Palpation  tenderness located on the left introitus    Strength  weak squeeze, no lift will compensate with gluteal contraction               PT Education - 12/05/17 1412    Education provided  Yes    Education Details  information on lubricants and moisturizers for the vaginal area, tips to avoid falls    Person(s) Educated  Patient    Methods  Explanation;Demonstration;Verbal cues;Handout    Comprehension  Returned demonstration;Verbalized understanding       PT Short Term Goals - 12/05/17 1425      PT SHORT TERM GOAL #1   Time  4    Period  Weeks    Status  New    Target Date  01/02/18      PT SHORT TERM GOAL #2   Title  understand tips to avoid falls    Time  4    Period  Weeks    Status  New    Target Date   01/02/18      PT SHORT TERM GOAL #3   Title  understand how to use vaginal moisturizers and lubricants to promote good vaginal health    Time  4    Period  Weeks    Status  New    Target Date  01/02/18      PT SHORT TERM GOAL #4   Title  Berg balance >/= 50/56 due to increased strength and improved balance    Time  4    Period  Weeks    Status  New    Target Date  01/02/18        PT Long Term Goals - 12/05/17 1427      PT LONG TERM GOAL #1   Title  independent with HEP and understand how to progress herselt    Time  8    Period  Weeks    Status  New    Target Date  01/30/18      PT LONG TERM GOAL #2   Title  Berg balance score is </= 52/56 due to increased strength and balance and not having  to hold onto walls    Time  8    Period  Weeks    Status  New      PT LONG TERM GOAL #3   Title  vaginal dryness has improved and patient is able to tell a difference due to using moisturizers to the vaginal area    Time  8    Period  Weeks    Status  New    Target Date  01/30/18      PT LONG TERM GOAL #4   Title  able to stand on one leg to put her pants on or go up a step due to improved balance and strength    Time  8    Period  Weeks    Status  New    Target Date  01/30/18      PT LONG TERM GOAL #5   Title  walk for 6 minutes with Shortness of breath decreased >/= 50% due to improved endurance    Time  8    Period  Weeks    Status  New    Target Date  01/30/18             Plan - 12/05/17 1413    Clinical Impression Statement  Patient is a 77 year old female s/p breast cancer estrogen positive on 05/27/2017 and right masectomy 07/02/2017.  Patient is having vaginal dryness due to the lack of Estrogen causing discomfort.  Patient reports urinary leakage when she coughs.  Patient reports she is always short of breath and fatique with walking and activity since she had a reaction to medication in the past few months. Patient feels unsteady and has to hold onto walls  when trying to walk. Berg balance score is 48/56 indicating 50% chance of falling.  Bilateral hip flexion is 4/5 and left hip extension is 3/5, pelvic floor strength is 2/5.  Patient would benefit from skilled therapy to improve endurance to decrease shortness of breath, increase strength to reduce fatique, and education on how to manage vaginal dryness to reduce irritation.      History and Personal Factors relevant to plan of care:  s/p right breast cancer 05/27/2017; right masectomy 07/02/2017; fibromyalgia,     Clinical Presentation  Evolving    Clinical Presentation due to:  increased shortness of breath, low endurance, risk fo falls and vaginal dryness    Clinical Decision Making  Moderate    Rehab Potential  Excellent    Clinical Impairments Affecting Rehab Potential  s/p right breast cancer 05/27/2017; right masectomy 07/02/2017; fibromyalgia,     PT Frequency  2x / week    PT Duration  8 weeks    PT Treatment/Interventions  Biofeedback;Gait training;Therapeutic activities;Therapeutic exercise;Balance training;Patient/family education;Neuromuscular re-education;Manual techniques    PT Next Visit Plan  review information on tips to avoid falls; review information on vaginal lubricants and moisturizers, balance exercises; nustep, LE strength    PT Home Exercise Plan  progress as needed    Consulted and Agree with Plan of Care  Patient       Patient will benefit from skilled therapeutic intervention in order to improve the following deficits and impairments:  Abnormal gait, Increased fascial restricitons, Decreased mobility, Increased muscle spasms, Decreased strength, Decreased activity tolerance, Decreased endurance, Decreased balance, Difficulty walking  Visit Diagnosis: Muscle weakness (generalized) - Plan: PT plan of care cert/re-cert  Other abnormalities of gait and mobility - Plan: PT plan of care cert/re-cert  Problem List Patient Active Problem List   Diagnosis Date Noted  .  Severe obesity ([place]) 12/02/2017  . Anemia, normocytic normochromic 08/10/2017  . Chest pain 08/10/2017  . Essential hypertension 08/10/2017  . CKD (chronic kidney disease), 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  . Type II diabetes mellitus ([place]) 08/10/2017  . Genetic testing 07/10/2017  . Stage I breast cancer, right ([place]) 07/02/2017  . Family history of breast cancer   . Family history of prostate cancer   . Malignant neoplasm of upper-inner quadrant of right breast in female, estrogen receptor positive ([place]) 05/31/2017  . Synovitis of knee 03/16/2014  . Postop Hypokalemia 08/07/2012  . Postop Hyponatremia 08/06/2012  . OA (osteoarthritis) of knee 08/04/2012    Earlie Counts, PT 12/05/17 2:33 PM   [facility] [address] [place], [place], [address] Phone: [phone]   Fax:  [phone]  Name: Kiara Brown MRN: [id number] Date of Birth: [birth year]

## 2017-12-05 NOTE — Patient Instructions (Addendum)
[employer] program for exercise Moisturizers . They are used in the vagina to hydrate the mucous membrane that make up the vaginal canal. . Designed to keep a more normal acid balance (ph) . Once placed in the vagina, it will last between two to three days.  . Use 2-3 times per week at bedtime and last longer than 60 min. . Ingredients to avoid is glycerin and fragrance, can increase chance of infection . Should not be used just before sex due to causing irritation . Most are gels administered either in a tampon-shaped applicator or as a vaginal suppository. They are non-hormonal.   Types of Moisturizers . Samul Dada- drug store . Vitamin E vaginal suppositories- Whole foods, Amazon . Moist Again . Coconut oil- can break down condoms . Michail Jewels . Yes moisturizer- amazon . NeuEve Silk , NeuEve Silver for menopausal or over 65 (if have severe vaginal atrophy or cancer treatments use NeuEve Silk for  1 month than move to [employer]epsi)- [employer], MapleFlower.dk . Olive and Bee intimate cream- www.oliveandbee.com.au  Creams to use externally on the Vulva area  Albertson's (good for for cancer patients that had radiation to the area)- [place] or [employer].FlyingBasics.com.br  V-magic cream - amazon*  Julva-amazon*  Vital "V Wild Yam salve ( help moisturize and help with thinning vulvar area, does have [place]   Things to avoid in the vaginal area . Do not use things to irritate the vulvar area . No lotions just specialized creams for the vulva area- Neogyn, V-magic, No soaps; can use Aveeno or Calendula cleanser if needed. Must be gentle . No deodorants . No douches . Good to sleep without underwear to let the vaginal area to air out . No scrubbing: spread the lips to let warm water rinse over labias and pat dry   Lubrication . Used for intercourse to reduce friction . Avoid ones that have glycerin,  warming gels, tingling gels, icing or cooling gel, scented . Avoid parabens due to a preservative similar to female sex hormone . May need to be reapplied once or several times during sexual activity . Can be applied to both partners genitals prior to vaginal penetration to minimize friction or irritation . Prevent irritation and mucosal tears that cause post coital pain and increased the risk of vaginal and urinary tract infections . Oil-based lubricants cannot be used with condoms due to breaking them down.  Least likely to irritate vaginal tissue.  . Plant based-lubes are safe . Silicone-based lubrication are thicker and last long and used for post-menopausal women  Vaginal Lubricators Here is a list of some suggested lubricators you can use for intercourse. Use the most hypoallergenic product.  You can place on you or your partner.   Slippery Stuff  Sylk or Sliquid Natural H2O ( good  if frequent UTI's)  Blossom Organics (www.blossom-organics.com)  Luvena   Coconut oil  PJur Woman Nude- water based lubricant, amazon  Uberlube- Amazon  Aloe Vera  Yes lubricant- [employer] Platinum-Silicone, Target, Walgreens  Olive and Bee intimate cream-  www.oliveandbee.com.au Things to avoid in lubricants are glycerin, warming gels, tingling gels, icing or cooling  gels, and scented gels.  Also avoid Vaseline. KY jelly, Replens, and Astroglide kills good bacteria(lactobacilli)  Things to avoid in the vaginal area . Do not use things to irritate the vulvar area . No lotions- see below . No soaps; can use Aveeno or Calendula  cleanser if needed. Must be gentle . No deodorants . No douches . Good to sleep without underwear to let the vaginal area to air out . No scrubbing: spread the lips to let warm water rinse over labias and pat dry  Creams that can be used on the [place] [employer] or Orange  Fall Prevention and Home Safety Falls cause injuries and can affect all age groups. It is possible to use preventive measures to significantly decrease the likelihood of falls. There are many simple measures which can make your home safer and prevent falls. OUTDOORS  Repair cracks and edges of walkways and driveways.  Remove high doorway thresholds.  Trim shrubbery on the main path into your home.  Have good outside lighting.  Clear walkways of tools, rocks, debris, and clutter.  Check that handrails are not broken and are securely fastened. Both sides of steps should have handrails.  Have leaves, snow, and ice cleared regularly.  Use sand or salt on walkways during winter months.  In the garage, clean up grease or oil spills. BATHROOM  Install night lights.  Install grab bars by the toilet and in the tub and shower.  Use non-skid mats or decals in the tub or shower.  Place a plastic non-slip stool in the shower to sit on, if needed.  Keep floors dry and clean up all water on the floor immediately.  Remove soap buildup in the tub or shower on a regular basis.  Secure bath mats with non-slip, double-sided rug tape.  Remove throw rugs and tripping hazards from the floors. BEDROOMS  Install night lights.  Make sure a bedside light is easy to reach.  Do not use oversized bedding.  Keep a telephone by your bedside.  Have a firm chair with side arms to use for getting dressed.  Remove throw rugs and tripping hazards from the floor. KITCHEN  Keep handles on pots and pans turned toward the center of the stove. Use back burners when possible.  Clean up spills quickly and allow time for drying.  Avoid walking on wet floors.  Avoid hot utensils and knives.  Position shelves so they are not too high or low.  Place commonly used objects within easy reach.  If necessary, use a sturdy step stool with a grab  bar when reaching.  Keep electrical cables out of the way.  Do not use floor polish or wax that makes floors slippery. If you must use wax, use non-skid floor wax.  Remove throw rugs and tripping hazards from the floor. STAIRWAYS  Never leave objects on stairs.  Place handrails on both sides of stairways and use them. Fix any loose handrails. Make sure handrails on both sides of the stairways are as long as the stairs.  Check carpeting to make sure it is firmly attached along stairs. Make repairs to worn or loose carpet promptly.  Avoid placing throw rugs at the top or bottom of stairways, or properly secure the rug with carpet tape to prevent slippage. Get rid of throw rugs, if possible.  Have an electrician put in a light switch at the top and bottom of the stairs. OTHER FALL PREVENTION TIPS  Wear low-heel or rubber-soled shoes that are supportive and fit well. Wear closed toe shoes.  When using a stepladder, make sure it is fully opened and both spreaders  are firmly locked. Do not climb a closed stepladder.  Add color or contrast paint or tape to grab bars and handrails in your home. Place contrasting color strips on first and last steps.  Learn and use mobility aids as needed. Install an electrical emergency response system.  Turn on lights to avoid dark areas. Replace light bulbs that burn out immediately. Get light switches that glow.  Arrange furniture to create clear pathways. Keep furniture in the same place.  Firmly attach carpet with non-skid or double-sided tape.  Eliminate uneven floor surfaces.  Select a carpet pattern that does not visually hide the edge of steps.  Be aware of all pets. OTHER HOME SAFETY TIPS  Set the water temperature for 120 F (48.8 C).  Keep emergency numbers on or near the telephone.  Keep smoke detectors on every level of the home and near sleeping areas. Document Released: 11/02/2002 Document Revised: 05/13/2012 Document Reviewed:  02/01/2012 [facility] Patient Information 2015 [place], [place]. This information is not intended to replace advice given to you by your health care provider. Make sure you discuss any questions you have with your health care provider. [facility] [address], [place], [place] [address] Phone # [phone] Fax [phone]

## 2017-12-10 ENCOUNTER — Ambulatory Visit: Payer: Medicare Other | Admitting: Physical Therapy

## 2017-12-10 ENCOUNTER — Encounter: Payer: Self-pay | Admitting: Physical Therapy

## 2017-12-10 DIAGNOSIS — M6281 Muscle weakness (generalized): Secondary | ICD-10-CM | POA: Diagnosis not present

## 2017-12-10 DIAGNOSIS — C50211 Malignant neoplasm of upper-inner quadrant of right female breast: Secondary | ICD-10-CM | POA: Diagnosis not present

## 2017-12-10 DIAGNOSIS — R2689 Other abnormalities of gait and mobility: Secondary | ICD-10-CM | POA: Diagnosis not present

## 2017-12-10 DIAGNOSIS — R293 Abnormal posture: Secondary | ICD-10-CM | POA: Diagnosis not present

## 2017-12-10 DIAGNOSIS — M25512 Pain in left shoulder: Secondary | ICD-10-CM | POA: Diagnosis not present

## 2017-12-10 DIAGNOSIS — Z170 Estrogen receptor positive status [ER+]: Secondary | ICD-10-CM | POA: Diagnosis not present

## 2017-12-10 NOTE — Therapy (Signed)
[facility] [address], [place], [place], [address] Phone: [phone]   Fax:  [phone]  Physical Therapy Treatment  Patient Details  Name: Kiara Brown MRN: [id number] Date of Birth: 10-15-1941 Referring Provider: Dr. Lurline Del   Encounter Date: 12/10/2017  PT End of Session - 12/10/17 1535    Visit Number  2    Date for PT Re-Evaluation  01/30/18    Authorization Type  Medicare BCBS    PT Start Time  1445    PT Stop Time  1523    PT Time Calculation (min)  38 min    Activity Tolerance  Patient tolerated treatment well    Behavior During Therapy  [facility] for tasks assessed/performed       Past Medical History:  Diagnosis Date  . Arthritis    osteo, rheumatoid  . Breast cancer ([place])   . Diverticulosis   . Dysrhythmia    hx rapid heartbeat, no cardiologist  . Family history of breast cancer   . Family history of prostate cancer   . Fibromyalgia    peripheral neuropathy  . Fibromyalgia   . GERD (gastroesophageal reflux disease)   . H/O hiatal hernia   . Hyperlipidemia   . PONV (postoperative nausea and vomiting)    "THROAT WITH EXTREME [id number]"6269, ulnar nerve surgery  . Sciatica   . Shortness of breath dyspnea    with exertion  . UTI (lower urinary tract infection)     Past Surgical History:  Procedure Laterality Date  . ABDOMINAL HYSTERECTOMY    . APPENDECTOMY    . back injection      July 2018- x1  . BREAST SURGERY Left 1986   biopsy, Recent 05/2017 at [facility]- had biopsy on R breast- malignant   . CARDIAC CATHETERIZATION  2009   "no blockage per pt"  . CHOLECYSTECTOMY    . CYSTOSCOPY WITH BIOPSY    . INCONTINENCE SURGERY    . JOINT REPLACEMENT    . KNEE ARTHROSCOPY     bilateral  . KNEE ARTHROSCOPY Left 03/17/2014   Procedure: ARTHROSCOPY LEFT KNEE WITH SYNOVECTOMY;  Surgeon: Gearlean Alf, MD;  [facility]: [facility];  Service: Orthopedics;  Laterality: Left;  . PORTACATH PLACEMENT  Left 07/02/2017   Procedure: INSERTION PORT-A-CATH;  Surgeon: Excell Seltzer, MD;  Location: [place];  Service: General;  Laterality: Left;  . RIGHT/LEFT HEART CATH AND CORONARY ANGIOGRAPHY N/A 08/30/2017   Procedure: RIGHT/LEFT HEART CATH AND CORONARY ANGIOGRAPHY;  Surgeon: Larey Dresser, MD;  Location: [place] [facility];  Service: Cardiovascular;  Laterality: N/A;  . ROTATOR CUFF REPAIR     right  . SIMPLE MASTECTOMY WITH AXILLARY SENTINEL NODE BIOPSY Right 07/02/2017   Procedure: RIGHT TOTAL  MASTECTOMY WITH AXILLARY SENTINEL LYMPH  NODE BIOPSY;  Surgeon: Excell Seltzer, MD;  Location: [place];  Service: General;  Laterality: Right;  . TOTAL KNEE ARTHROPLASTY  08/04/2012   Procedure: TOTAL KNEE ARTHROPLASTY;  Surgeon: Gearlean Alf, MD;  [facility];  Service: Orthopedics;  Laterality: Left;  . TOTAL KNEE ARTHROPLASTY Right 04/11/2015   Procedure: RIGHT TOTAL KNEE ARTHROPLASTY;  Surgeon: Gaynelle Arabian, MD;  [facility];  Service: Orthopedics;  Laterality: Right;  . ulnar nerve surgery Right 1986    There were no vitals filed for this visit.  Subjective Assessment - 12/10/17 1450    Subjective  I am doing the same.     Currently in Pain?  No/denies  [place] Adult PT Treatment/Exercise - 12/10/17 0001      Self-Care   Self-Care  Other Self-Care Comments    Other Self-Care Comments   reviewed tips to avoid falls, vaginal moisturizers and lubricants  and she understands      Exercises   Exercises  Knee/Hip      Lumbar Exercises: Supine   Ab Set  10 reps;5 seconds;Limitations    AB Set Limitations  needed tactile cues and verbal cues for contraction      Knee/Hip Exercises: Stretches   Other Knee/Hip Stretches  therapist stretched bilateral hamstring, hip adductors, hip rotators      Knee/Hip Exercises: Aerobic   Nustep  level 1  no arms seat #7 x 5 min      Knee/Hip Exercises: Standing   Hip Abduction  Stengthening;Right;Left;1  set;10 reps;Knee straight    Hip Extension  Stengthening;Right;Left;1 set;10 reps;Knee straight    Other Standing Knee Exercises  heel raises 15 x; one legged stance 15 sec 2 times each side with holding on             PT Education - 12/10/17 1534    Education provided  Yes    Education Details  balance exercises; abdominal with pelvic floor strength and bridge with pelvic floor    Person(s) Educated  Patient    Methods  Explanation;Demonstration;Verbal cues;Handout    Comprehension  Verbalized understanding;Returned demonstration       PT Short Term Goals - 12/10/17 1540      PT SHORT TERM GOAL #1   Title  independent with initial HEP    Time  4    Period  Weeks    Status  Achieved      PT SHORT TERM GOAL #2   Title  understand tips to avoid falls    Time  4    Period  Weeks    Status  Achieved      PT SHORT TERM GOAL #3   Title  understand how to use vaginal moisturizers and lubricants to promote good vaginal health    Time  4    Period  Weeks    Status  Achieved      PT SHORT TERM GOAL #4   Title  Berg balance >/= 50/56 due to increased strength and improved balance    Time  4    Period  Weeks    Status  On-going        PT Long Term Goals - 12/05/17 1427      PT LONG TERM GOAL #1   Title  independent with HEP and understand how to progress herselt    Time  8    Period  Weeks    Status  New    Target Date  01/30/18      PT LONG TERM GOAL #2   Title  Berg balance score is </= 52/56 due to increased strength and balance and not having to hold onto walls    Time  8    Period  Weeks    Status  New      PT LONG TERM GOAL #3   Title  vaginal dryness has improved and patient is able to tell a difference due to using moisturizers to the vaginal area    Time  8    Period  Weeks    Status  New    Target Date  01/30/18      PT LONG TERM GOAL #4  Title  able to stand on one leg to put her pants on or go up a step due to improved balance and strength     Time  8    Period  Weeks    Status  New    Target Date  01/30/18      PT LONG TERM GOAL #5   Title  walk for 6 minutes with Shortness of breath decreased >/= 50% due to improved endurance    Time  8    Period  Weeks    Status  New    Target Date  01/30/18            Plan - 12/10/17 1450    Clinical Impression Statement  Patient understands how to use moisturizers and lubricants to help with vaginal health.  Patient was fatiqued after therapy.  Patient did well on the nustep but not able to do the arms.  Patient will benefit from skilled therapy  to improve endurance to decrease shortness of breath increase strength t oreduce fatique and education on how to [facility] vaginal dryness to reduce irritation.     Rehab Potential  Excellent    Clinical Impairments Affecting Rehab Potential  s/p right breast cancer 05/27/2017; right masectomy 07/02/2017; fibromyalgia,     PT Frequency  2x / week    PT Duration  8 weeks    PT Treatment/Interventions  Biofeedback;Gait training;Therapeutic activities;Therapeutic exercise;Balance training;Patient/family education;Neuromuscular re-education;Manual techniques    PT Next Visit Plan   balance exercises; nustep, LE strength; hip stretches and back stretches    PT Home Exercise Plan  progress as needed    Recommended Other Services  MD signed the initial eval    Consulted and Agree with Plan of Care  Patient       Patient will benefit from skilled therapeutic intervention in order to improve the following deficits and impairments:  Abnormal gait, Increased fascial restricitons, Decreased mobility, Increased muscle spasms, Decreased strength, Decreased activity tolerance, Decreased endurance, Decreased balance, Difficulty walking  Visit Diagnosis: Muscle weakness (generalized)  Other abnormalities of gait and mobility     Problem List Patient Active Problem List   Diagnosis Date Noted  . Severe obesity ([place]) 12/02/2017  . Anemia, normocytic  normochromic 08/10/2017  . Chest pain 08/10/2017  . Essential hypertension 08/10/2017  . CKD (chronic kidney disease), 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  . Type II diabetes mellitus ([place]) 08/10/2017  . Genetic testing 07/10/2017  . Stage I breast cancer, right ([place]) 07/02/2017  . Family history of breast cancer   . Family history of prostate cancer   . Malignant neoplasm of upper-inner quadrant of right breast in female, estrogen receptor positive ([place]) 05/31/2017  . Synovitis of knee 03/16/2014  . Postop Hypokalemia 08/07/2012  . Postop Hyponatremia 08/06/2012  . OA (osteoarthritis) of knee 08/04/2012    Earlie Counts, PT 12/10/17 3:42 PM   [facility] [address] [place], [place], [address] Phone: [phone]   Fax:  [phone]  Name: Kiara Brown MRN: [id number] Date of Birth: July 20, 1941

## 2017-12-10 NOTE — Patient Instructions (Addendum)
ABDUCTION: Standing (Active)    Stand, feet flat. Lift right leg out to side. . Complete __1_ sets of _10__ repetitions. Perform ___ sessions per day. 1 http://gtsc.exer.us/111   Copyright  VHI. All rights reserved.  EXTENSION: Standing (Active)    Stand, both feet flat. Draw right leg behind body as far as possible.  Complete _1__ sets of __10_ repetitions. Perform _1__ sessions per day.  http://gtsc.exer.us/77   Copyright  VHI. All rights reserved.  Heel Raises    Stand with support. Tighten pelvic floor and hold. With knees straight, raise heels off ground. Hold _1__ seconds. Relax for __1_ seconds. Repeat _15__ times. Do _1__ times a day.  Copyright  VHI. All rights reserved.  Single Leg - Eyes Open    Holding support, lift right leg while maintaining balance over other leg. Progress to removing hands from support surface for longer periods of time. Hold__15__ seconds. Repeat __3__ times per session. Do __1__ sessions per day.  Copyright  VHI. All rights reserved.  Isometric Hold (Hook-Lying)    Lie with hips and knees bent. Slowly inhale, and then exhale. Pull navel toward spine and Hold for _5__ seconds. Continue to breathe in and out during hold. Rest for _5__ seconds. Repeat _10__ times. Do _1__ times a day.   Copyright  VHI. All rights reserved.  Bracing With Bridging (Hook-Lying)    With neutral spine, tighten pelvic floor and abdominals and hold. Lift bottom. Repeat _10__ times. Do __1_ times a day.   Copyright  VHI. All rights reserved.  [place] [address], [place], [place] [address] Phone # [phone] Fax [phone]

## 2017-12-12 ENCOUNTER — Ambulatory Visit: Payer: Medicare Other | Admitting: Physical Therapy

## 2017-12-12 ENCOUNTER — Encounter: Payer: Self-pay | Admitting: Physical Therapy

## 2017-12-12 DIAGNOSIS — M6281 Muscle weakness (generalized): Secondary | ICD-10-CM | POA: Diagnosis not present

## 2017-12-12 DIAGNOSIS — R2689 Other abnormalities of gait and mobility: Secondary | ICD-10-CM | POA: Diagnosis not present

## 2017-12-12 DIAGNOSIS — C50211 Malignant neoplasm of upper-inner quadrant of right female breast: Secondary | ICD-10-CM | POA: Diagnosis not present

## 2017-12-12 DIAGNOSIS — R293 Abnormal posture: Secondary | ICD-10-CM | POA: Diagnosis not present

## 2017-12-12 DIAGNOSIS — M25512 Pain in left shoulder: Secondary | ICD-10-CM | POA: Diagnosis not present

## 2017-12-12 DIAGNOSIS — Z170 Estrogen receptor positive status [ER+]: Secondary | ICD-10-CM | POA: Diagnosis not present

## 2017-12-12 NOTE — Therapy (Signed)
[facility] [address] [place], [place], [address] Phone: [phone]   Fax:  [phone]  Physical Therapy Treatment  Patient Details  Name: Kiara Brown MRN: [id number] Date of Birth: July 28, 1941 Referring Provider: Dr. Lurline Del   Encounter Date: 12/12/2017  PT End of Session - 12/12/17 1525    Visit Number  3    Date for PT Re-Evaluation  01/30/18    Authorization Type  Medicare BCBS    PT Start Time  1445    PT Stop Time  1525    PT Time Calculation (min)  40 min    Activity Tolerance  Patient tolerated treatment well    Behavior During Therapy  [facility] for tasks assessed/performed       Past Medical History:  Diagnosis Date  . Arthritis    osteo, rheumatoid  . Breast cancer ([place])   . Diverticulosis   . Dysrhythmia    hx rapid heartbeat, no cardiologist  . Family history of breast cancer   . Family history of prostate cancer   . Fibromyalgia    peripheral neuropathy  . Fibromyalgia   . GERD (gastroesophageal reflux disease)   . H/O hiatal hernia   . Hyperlipidemia   . PONV (postoperative nausea and vomiting)    "THROAT WITH EXTREME [id number], ulnar nerve surgery  . Sciatica   . Shortness of breath dyspnea    with exertion  . UTI (lower urinary tract infection)     Past Surgical History:  Procedure Laterality Date  . ABDOMINAL HYSTERECTOMY    . APPENDECTOMY    . back injection      July 2018- x1  . BREAST SURGERY Left 1986   biopsy, Recent 05/2017 at [facility]- had biopsy on R breast- malignant   . CARDIAC CATHETERIZATION  2009   "no blockage per pt"  . CHOLECYSTECTOMY    . CYSTOSCOPY WITH BIOPSY    . INCONTINENCE SURGERY    . JOINT REPLACEMENT    . KNEE ARTHROSCOPY     bilateral  . KNEE ARTHROSCOPY Left 03/17/2014   Procedure: ARTHROSCOPY LEFT KNEE WITH SYNOVECTOMY;  Surgeon: Gearlean Alf, MD;  [facility];  Service: Orthopedics;  Laterality: Left;  . PORTACATH PLACEMENT  Left 07/02/2017   Procedure: INSERTION PORT-A-CATH;  Surgeon: Excell Seltzer, MD;  Location: [place];  Service: General;  Laterality: Left;  . RIGHT/LEFT HEART CATH AND CORONARY ANGIOGRAPHY N/A 08/30/2017   Procedure: RIGHT/LEFT HEART CATH AND CORONARY ANGIOGRAPHY;  Surgeon: Larey Dresser, MD;  Location: [place] [facility];  Service: Cardiovascular;  Laterality: N/A;  . ROTATOR CUFF REPAIR     right  . SIMPLE MASTECTOMY WITH AXILLARY SENTINEL NODE BIOPSY Right 07/02/2017   Procedure: RIGHT TOTAL  MASTECTOMY WITH AXILLARY SENTINEL LYMPH  NODE BIOPSY;  Surgeon: Excell Seltzer, MD;  Location: [place];  Service: General;  Laterality: Right;  . TOTAL KNEE ARTHROPLASTY  08/04/2012   Procedure: TOTAL KNEE ARTHROPLASTY;  Surgeon: Gearlean Alf, MD;  [facility];  Service: Orthopedics;  Laterality: Left;  . TOTAL KNEE ARTHROPLASTY Right 04/11/2015   Procedure: RIGHT TOTAL KNEE ARTHROPLASTY;  Surgeon: Gaynelle Arabian, MD;  [facility];  Service: Orthopedics;  Laterality: Right;  . ulnar nerve surgery Right 1986    There were no vitals filed for this visit.  Subjective Assessment - 12/12/17 1453    Subjective  I am doing well.     Currently in Pain?  No/denies  Multiple Pain Sites  No                      [facility] Adult PT Treatment/Exercise - 12/12/17 0001      Ambulation/Gait   Gait Comments  320 feet in 2 min 41 sec , stopped SOB      Lumbar Exercises: Stretches   Passive Hamstring Stretch  1 rep;Left;Right;30 seconds therapist stretched    Single Knee to Chest Stretch  2 reps;Left;Right;20 seconds    Lower Trunk Rotation  5 reps;10 seconds    Piriformis Stretch  2 reps;Left;Right;30 seconds therapist stretced      Lumbar Exercises: Supine   Bridge  5 reps;5 seconds    Isometric Hip Flexion  10 reps;5 seconds      Knee/Hip Exercises: Aerobic   Nustep  level 1  no arms seat #7 x 5 min      Knee/Hip Exercises: Standing   Step Down  1 set;Both;Left;5 reps;Hand  Hold: 2    Other Standing Knee Exercises  tandem stance 30 sec 5 times each way      Knee/Hip Exercises: Seated   Long Arc Quad  Strengthening;Right;Left;3 sets;10 reps;Weights    Long Arc Quad Weight  2 lbs.    Heel Slides  1 set;Left;Right;5 reps    Clamshell with TheraBand  Green 15x2    Sit to Sand  10 reps;without UE support;Other (comment) greenband around knees               PT Short Term Goals - 12/10/17 1540      PT SHORT TERM GOAL #1   Title  independent with initial HEP    Time  4    Period  Weeks    Status  Achieved      PT SHORT TERM GOAL #2   Title  understand tips to avoid falls    Time  4    Period  Weeks    Status  Achieved      PT SHORT TERM GOAL #3   Title  understand how to use vaginal moisturizers and lubricants to promote good vaginal health    Time  4    Period  Weeks    Status  Achieved      PT SHORT TERM GOAL #4   Title  Berg balance >/= 50/56 due to increased strength and improved balance    Time  4    Period  Weeks    Status  On-going        PT Long Term Goals - 12/05/17 1427      PT LONG TERM GOAL #1   Title  independent with HEP and understand how to progress herselt    Time  8    Period  Weeks    Status  New    Target Date  01/30/18      PT LONG TERM GOAL #2   Title  Berg balance score is </= 52/56 due to increased strength and balance and not having to hold onto walls    Time  8    Period  Weeks    Status  New      PT LONG TERM GOAL #3   Title  vaginal dryness has improved and patient is able to tell a difference due to using moisturizers to the vaginal area    Time  8    Period  Weeks    Status  New    Target Date  01/30/18      PT LONG TERM GOAL #4   Title  able to stand on one leg to put her pants on or go up a step due to improved balance and strength    Time  8    Period  Weeks    Status  New    Target Date  01/30/18      PT LONG TERM GOAL #5   Title  walk for 6 minutes with Shortness of breath  decreased >/= 50% due to improved endurance    Time  8    Period  Weeks    Status  New    Target Date  01/30/18            Plan - 12/12/17 1525    Clinical Impression Statement  Patient was not as fatiqued today.  Patient able to walk 320 feet in 2 min 40 seconds then was SOB.  Patient had a cramp in her back after the stretches.  Patient will benefit from skilled therapy to reduce fatique and improve endurance.     Rehab Potential  Excellent    Clinical Impairments Affecting Rehab Potential  s/p right breast cancer 05/27/2017; right masectomy 07/02/2017; fibromyalgia,     PT Frequency  2x / week    PT Duration  8 weeks    PT Treatment/Interventions  Biofeedback;Gait training;Therapeutic activities;Therapeutic exercise;Balance training;Patient/family education;Neuromuscular re-education;Manual techniques    PT Next Visit Plan   balance exercises; nustep 8 min, LE strength; hip stretches and back stretches; walk around the gym timed    PT Home Exercise Plan  progress as needed    Consulted and Agree with Plan of Care  Patient       Patient will benefit from skilled therapeutic intervention in order to improve the following deficits and impairments:  Abnormal gait, Increased fascial restricitons, Decreased mobility, Increased muscle spasms, Decreased strength, Decreased activity tolerance, Decreased endurance, Decreased balance, Difficulty walking  Visit Diagnosis: Muscle weakness (generalized)  Other abnormalities of gait and mobility     Problem List Patient Active Problem List   Diagnosis Date Noted  . Severe obesity ([place]) 12/02/2017  . Anemia, normocytic normochromic 08/10/2017  . Chest pain 08/10/2017  . Essential hypertension 08/10/2017  . CKD (chronic kidney disease), 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  . Type II diabetes mellitus ([place]) 08/10/2017  . Genetic testing 07/10/2017  . Stage I breast cancer, right ([place]) 07/02/2017  . Family history of breast cancer   . Family history of  prostate cancer   . Malignant neoplasm of upper-inner quadrant of right breast in female, estrogen receptor positive ([place]) 05/31/2017  . Synovitis of knee 03/16/2014  . Postop Hypokalemia 08/07/2012  . Postop Hyponatremia 08/06/2012  . OA (osteoarthritis) of knee 08/04/2012    Earlie Counts, PT 12/12/17 3:28 PM    [facility] [address], [place], [place], [address] Phone: [phone]   Fax:  [phone]  Name: Kiara Brown MRN: [id number] Date of Birth: 1941/04/13

## 2017-12-16 ENCOUNTER — Ambulatory Visit: Payer: Medicare Other | Admitting: Physical Therapy

## 2017-12-16 ENCOUNTER — Encounter: Payer: Self-pay | Admitting: Physical Therapy

## 2017-12-16 DIAGNOSIS — M6281 Muscle weakness (generalized): Secondary | ICD-10-CM | POA: Diagnosis not present

## 2017-12-16 DIAGNOSIS — M25512 Pain in left shoulder: Secondary | ICD-10-CM | POA: Diagnosis not present

## 2017-12-16 DIAGNOSIS — R2689 Other abnormalities of gait and mobility: Secondary | ICD-10-CM | POA: Diagnosis not present

## 2017-12-16 DIAGNOSIS — Z170 Estrogen receptor positive status [ER+]: Secondary | ICD-10-CM | POA: Diagnosis not present

## 2017-12-16 DIAGNOSIS — R293 Abnormal posture: Secondary | ICD-10-CM | POA: Diagnosis not present

## 2017-12-16 DIAGNOSIS — C50211 Malignant neoplasm of upper-inner quadrant of right female breast: Secondary | ICD-10-CM

## 2017-12-16 DIAGNOSIS — M25511 Pain in right shoulder: Secondary | ICD-10-CM

## 2017-12-16 DIAGNOSIS — G8929 Other chronic pain: Secondary | ICD-10-CM

## 2017-12-16 NOTE — Therapy (Signed)
[facility] [address], [place], [place], [address] Phone: [phone]   Fax:  [phone]  Physical Therapy Treatment  Patient Details  Name: Kiara Brown MRN: [id number] Date of Birth: 11-Sep-1941 Referring Provider: Dr. Lurline Del   Encounter Date: 12/16/2017  PT End of Session - 12/16/17 1436    Visit Number  4    Date for PT Re-Evaluation  01/30/18    Authorization Type  Medicare BCBS    PT Start Time  1436    PT Stop Time  1518    PT Time Calculation (min)  42 min    Activity Tolerance  Patient tolerated treatment well    Behavior During Therapy  [facility] for tasks assessed/performed       Past Medical History:  Diagnosis Date  . Arthritis    osteo, rheumatoid  . Breast cancer ([place])   . Diverticulosis   . Dysrhythmia    hx rapid heartbeat, no cardiologist  . Family history of breast cancer   . Family history of prostate cancer   . Fibromyalgia    peripheral neuropathy  . Fibromyalgia   . GERD (gastroesophageal reflux disease)   . H/O hiatal hernia   . Hyperlipidemia   . PONV (postoperative nausea and vomiting)    "THROAT WITH EXTREME [id number]"4287, ulnar nerve surgery  . Sciatica   . Shortness of breath dyspnea    with exertion  . UTI (lower urinary tract infection)     Past Surgical History:  Procedure Laterality Date  . ABDOMINAL HYSTERECTOMY    . APPENDECTOMY    . back injection      July 2018- x1  . BREAST SURGERY Left 1986   biopsy, Recent 05/2017 at [facility]- had biopsy on R breast- malignant   . CARDIAC CATHETERIZATION  2009   "no blockage per pt"  . CHOLECYSTECTOMY    . CYSTOSCOPY WITH BIOPSY    . INCONTINENCE SURGERY    . JOINT REPLACEMENT    . KNEE ARTHROSCOPY     bilateral  . KNEE ARTHROSCOPY Left 03/17/2014   Procedure: ARTHROSCOPY LEFT KNEE WITH SYNOVECTOMY;  Surgeon: Gearlean Alf, MD;  [facility]: [facility];  Service: Orthopedics;  Laterality: Left;  . PORTACATH PLACEMENT  Left 07/02/2017   Procedure: INSERTION PORT-A-CATH;  Surgeon: Excell Seltzer, MD;  Location: [place];  Service: General;  Laterality: Left;  . RIGHT/LEFT HEART CATH AND CORONARY ANGIOGRAPHY N/A 08/30/2017   Procedure: RIGHT/LEFT HEART CATH AND CORONARY ANGIOGRAPHY;  Surgeon: Larey Dresser, MD;  Location: [place] [facility];  Service: Cardiovascular;  Laterality: N/A;  . ROTATOR CUFF REPAIR     right  . SIMPLE MASTECTOMY WITH AXILLARY SENTINEL NODE BIOPSY Right 07/02/2017   Procedure: RIGHT TOTAL  MASTECTOMY WITH AXILLARY SENTINEL LYMPH  NODE BIOPSY;  Surgeon: Excell Seltzer, MD;  Location: [place];  Service: General;  Laterality: Right;  . TOTAL KNEE ARTHROPLASTY  08/04/2012   Procedure: TOTAL KNEE ARTHROPLASTY;  Surgeon: Gearlean Alf, MD;  [facility];  Service: Orthopedics;  Laterality: Left;  . TOTAL KNEE ARTHROPLASTY Right 04/11/2015   Procedure: RIGHT TOTAL KNEE ARTHROPLASTY;  Surgeon: Gaynelle Arabian, MD;  [facility];  Service: Orthopedics;  Laterality: Right;  . ulnar nerve surgery Right 1986    There were no vitals filed for this visit.  Subjective Assessment - 12/16/17 1437    Subjective  Moderate pain day.     Currently in Pain?  -- Fibro pain  [facility] Adult PT Treatment/Exercise - 12/16/17 0001      Ambulation/Gait   Gait Comments  pt walked 4 minutes without stopping and was able to hold a conversation the entire time.       Self-Care   Other Self-Care Comments   Educated in sleeping positions and use of props to reduce LBP, Pt verbally understood      Lumbar Exercises: Stretches   Active Hamstring Stretch  Right;Left;2 reps;20 seconds    Single Knee to Chest Stretch  2 reps;Left;Right;20 seconds Towel to lengthen arms    Lower Trunk Rotation  5 reps;10 seconds    Pelvic Tilt  10 reps      Knee/Hip Exercises: Aerobic   Nustep  L1 x 6 min PTA present for status update      Knee/Hip Exercises: Standing   Heel Raises  Both;1  set;15 reps VC for lower abs    Hip Abduction  AROM;Stengthening;Both;2 sets;10 reps;Knee straight VC for lower abs, posture      Knee/Hip Exercises: Seated   Long Arc Quad  Strengthening;Both;2 sets;15 reps    Long Arc Quad Weight  3 lbs.    Clamshell with TheraBand  Green 15x2    Abduction/Adduction   -- Shoulder red band hor abd 2x 10    Sit to Sand  10 reps;without UE support;Other (comment) greenband around knees               PT Short Term Goals - 12/10/17 1540      PT SHORT TERM GOAL #1   Title  independent with initial HEP    Time  4    Period  Weeks    Status  Achieved      PT SHORT TERM GOAL #2   Title  understand tips to avoid falls    Time  4    Period  Weeks    Status  Achieved      PT SHORT TERM GOAL #3   Title  understand how to use vaginal moisturizers and lubricants to promote good vaginal health    Time  4    Period  Weeks    Status  Achieved      PT SHORT TERM GOAL #4   Title  Berg balance >/= 50/56 due to increased strength and improved balance    Time  4    Period  Weeks    Status  On-going        PT Long Term Goals - 12/05/17 1427      PT LONG TERM GOAL #1   Title  independent with HEP and understand how to progress herselt    Time  8    Period  Weeks    Status  New    Target Date  01/30/18      PT LONG TERM GOAL #2   Title  Berg balance score is </= 52/56 due to increased strength and balance and not having to hold onto walls    Time  8    Period  Weeks    Status  New      PT LONG TERM GOAL #3   Title  vaginal dryness has improved and patient is able to tell a difference due to using moisturizers to the vaginal area    Time  8    Period  Weeks    Status  New    Target Date  01/30/18      PT LONG TERM GOAL #4  Title  able to stand on one leg to put her pants on or go up a step due to improved balance and strength    Time  8    Period  Weeks    Status  New    Target Date  01/30/18      PT LONG TERM GOAL #5   Title   walk for 6 minutes with Shortness of breath decreased >/= 50% due to improved endurance    Time  8    Period  Weeks    Status  New    Target Date  01/30/18            Plan - 12/16/17 1436    Clinical Impression Statement  Pt reports she feels like she is having a moderate pain day ( fibro related) and was still able to complete all exercises that include some small increases in weight for LE strength. Pt was able to walk in clinic today for 4 min straight and hold a conversation throughout. Pt had only mild fatigue at end of session, but reported her low back felt "tired." PTA encouraged pt to try and find/contract her lower abs more often throughout the day to have more support more often. She agreed to try.     Rehab Potential  Excellent    Clinical Impairments Affecting Rehab Potential  s/p right breast cancer 05/27/2017; right masectomy 07/02/2017; fibromyalgia,     PT Duration  8 weeks    PT Treatment/Interventions  Biofeedback;Gait training;Therapeutic activities;Therapeutic exercise;Balance training;Patient/family education;Neuromuscular re-education;Manual techniques    PT Next Visit Plan   balance exercises; nustep 8 min, LE strength; hip stretches and back stretches; walk around the gym timed    Consulted and Agree with Plan of Care  Patient       Patient will benefit from skilled therapeutic intervention in order to improve the following deficits and impairments:  Abnormal gait, Increased fascial restricitons, Decreased mobility, Increased muscle spasms, Decreased strength, Decreased activity tolerance, Decreased endurance, Decreased balance, Difficulty walking  Visit Diagnosis: Muscle weakness (generalized)  Other abnormalities of gait and mobility  Abnormal posture  Malignant neoplasm of upper-inner quadrant of right breast in female, estrogen receptor positive (HCC)  Chronic left shoulder pain  Chronic right shoulder pain     Problem List Patient Active Problem  List   Diagnosis Date Noted  . Severe obesity ([place]) 12/02/2017  . Anemia, normocytic normochromic 08/10/2017  . Chest pain 08/10/2017  . Essential hypertension 08/10/2017  . CKD (chronic kidney disease), 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  . Type II diabetes mellitus ([place]) 08/10/2017  . Genetic testing 07/10/2017  . Stage I breast cancer, right ([place]) 07/02/2017  . Family history of breast cancer   . Family history of prostate cancer   . Malignant neoplasm of upper-inner quadrant of right breast in female, estrogen receptor positive ([place]) 05/31/2017  . Synovitis of knee 03/16/2014  . Postop Hypokalemia 08/07/2012  . Postop Hyponatremia 08/06/2012  . OA (osteoarthritis) of knee 08/04/2012    Joesiah Lonon, PTA 12/16/2017, 3:20 PM  [facility] [address] [place], [place], [address] Phone: [phone]   Fax:  [phone]  Name: Kiara Brown MRN: [id number] Date of Birth: [birth year]

## 2017-12-18 ENCOUNTER — Encounter: Payer: Medicare Other | Admitting: Physical Therapy

## 2017-12-23 DIAGNOSIS — R300 Dysuria: Secondary | ICD-10-CM | POA: Diagnosis not present

## 2017-12-24 ENCOUNTER — Encounter: Payer: Self-pay | Admitting: Physical Therapy

## 2017-12-24 ENCOUNTER — Ambulatory Visit: Payer: Medicare Other | Admitting: Physical Therapy

## 2017-12-24 DIAGNOSIS — M25512 Pain in left shoulder: Secondary | ICD-10-CM | POA: Diagnosis not present

## 2017-12-24 DIAGNOSIS — R2689 Other abnormalities of gait and mobility: Secondary | ICD-10-CM

## 2017-12-24 DIAGNOSIS — M6281 Muscle weakness (generalized): Secondary | ICD-10-CM

## 2017-12-24 DIAGNOSIS — R293 Abnormal posture: Secondary | ICD-10-CM | POA: Diagnosis not present

## 2017-12-24 DIAGNOSIS — C50211 Malignant neoplasm of upper-inner quadrant of right female breast: Secondary | ICD-10-CM | POA: Diagnosis not present

## 2017-12-24 DIAGNOSIS — Z170 Estrogen receptor positive status [ER+]: Secondary | ICD-10-CM | POA: Diagnosis not present

## 2017-12-24 NOTE — Therapy (Signed)
[facility] [address], [place], [place], [address] Phone: [phone]   Fax:  [phone]  Physical Therapy Treatment  Patient Details  Name: Kiara Brown MRN: [id number] Date of Birth: 1941-11-19 Referring Provider: Dr. Lurline Del   Encounter Date: 12/24/2017  PT End of Session - 12/24/17 1525    Visit Number  5    Date for PT Re-Evaluation  01/30/18    Authorization Type  Medicare BCBS    PT Start Time  1445    PT Stop Time  1525    PT Time Calculation (min)  40 min    Activity Tolerance  Patient tolerated treatment well    Behavior During Therapy  [facility] for tasks assessed/performed       Past Medical History:  Diagnosis Date  . Arthritis    osteo, rheumatoid  . Breast cancer ([place])   . Diverticulosis   . Dysrhythmia    hx rapid heartbeat, no cardiologist  . Family history of breast cancer   . Family history of prostate cancer   . Fibromyalgia    peripheral neuropathy  . Fibromyalgia   . GERD (gastroesophageal reflux disease)   . H/O hiatal hernia   . Hyperlipidemia   . PONV (postoperative nausea and vomiting)    "THROAT WITH EXTREME ONGEXBM"8413, ulnar nerve surgery  . Sciatica   . Shortness of breath dyspnea    with exertion  . UTI (lower urinary tract infection)     Past Surgical History:  Procedure Laterality Date  . ABDOMINAL HYSTERECTOMY    . APPENDECTOMY    . back injection      July 2018- x1  . BREAST SURGERY Left 1986   biopsy, Recent 05/2017 at [facility]- had biopsy on R breast- malignant   . CARDIAC CATHETERIZATION  2009   "no blockage per pt"  . CHOLECYSTECTOMY    . CYSTOSCOPY WITH BIOPSY    . INCONTINENCE SURGERY    . JOINT REPLACEMENT    . KNEE ARTHROSCOPY     bilateral  . KNEE ARTHROSCOPY Left 03/17/2014   Procedure: ARTHROSCOPY LEFT KNEE WITH SYNOVECTOMY;  Surgeon: Gearlean Alf, MD;  [facility];  Service: Orthopedics;  Laterality: Left;  . PORTACATH PLACEMENT  Left 07/02/2017   Procedure: INSERTION PORT-A-CATH;  Surgeon: Excell Seltzer, MD;  Location: [place];  Service: General;  Laterality: Left;  . RIGHT/LEFT HEART CATH AND CORONARY ANGIOGRAPHY N/A 08/30/2017   Procedure: RIGHT/LEFT HEART CATH AND CORONARY ANGIOGRAPHY;  Surgeon: Larey Dresser, MD;  Location: [facility];  Service: Cardiovascular;  Laterality: N/A;  . ROTATOR CUFF REPAIR     right  . SIMPLE MASTECTOMY WITH AXILLARY SENTINEL NODE BIOPSY Right 07/02/2017   Procedure: RIGHT TOTAL  MASTECTOMY WITH AXILLARY SENTINEL LYMPH  NODE BIOPSY;  Surgeon: Excell Seltzer, MD;  Location: [place];  Service: General;  Laterality: Right;  . TOTAL KNEE ARTHROPLASTY  08/04/2012   Procedure: TOTAL KNEE ARTHROPLASTY;  Surgeon: Gearlean Alf, MD;  [facility];  Service: Orthopedics;  Laterality: Left;  . TOTAL KNEE ARTHROPLASTY Right 04/11/2015   Procedure: RIGHT TOTAL KNEE ARTHROPLASTY;  Surgeon: Gaynelle Arabian, MD;  [facility];  Service: Orthopedics;  Laterality: Right;  . ulnar nerve surgery Right 1986    There were no vitals filed for this visit.  Subjective Assessment - 12/24/17 1450    Subjective  I got the floor bike and have been using it daily. At night I still have  back pain and need a pain patch.     Currently in Pain?  Yes    Pain Score  3     Pain Location  Back    Pain Orientation  Lower    Pain Descriptors / Indicators  Sharp    Pain Type  Acute pain    Pain Onset  More than a month ago    Pain Frequency  Constant    Aggravating Factors   night time goes to a 6/10    Pain Relieving Factors  pain patch    Multiple Pain Sites  Yes         [facility] PT Assessment - 12/24/17 0001      Strength   Left Hip Flexion  4+/5    Left Hip Extension  3+/5    Left Hip ABduction  4+/5                  [facility] Adult PT Treatment/Exercise - 12/24/17 0001      Neuro Re-ed    Neuro Re-ed Details   walk with head turns and look up and down lost balance 1 time and  regained it      Lumbar Exercises: Stretches   Active Hamstring Stretch  Right;Left;2 reps;20 seconds      Lumbar Exercises: Supine   Bridge  5 reps;1 second      Knee/Hip Exercises: Aerobic   Nustep  L2 x 6 min Seat #7; no arms PTA present for status update      Knee/Hip Exercises: Standing   Rocker Board  1 minute    Other Standing Knee Exercises  tandem stance 30 sec 3 times each way    Other Standing Knee Exercises  stand on one foot 5 sec 4 times each foot      Knee/Hip Exercises: Seated   Long Arc Quad  Strengthening;Right;Left;10 reps;Weights;2 sets    Long Arc Quad Weight  5 lbs.    Knee/Hip Flexion  resisted hip flexion hold 5 sec 10 times each side    Sit to Sand  10 reps;without UE support;Other (comment) greenband around knees               PT Short Term Goals - 12/10/17 1540      PT SHORT TERM GOAL #1   Title  independent with initial HEP    Time  4    Period  Weeks    Status  Achieved      PT SHORT TERM GOAL #2   Title  understand tips to avoid falls    Time  4    Period  Weeks    Status  Achieved      PT SHORT TERM GOAL #3   Title  understand how to use vaginal moisturizers and lubricants to promote good vaginal health    Time  4    Period  Weeks    Status  Achieved      PT SHORT TERM GOAL #4   Title  Berg balance >/= 50/56 due to increased strength and improved balance    Time  4    Period  Weeks    Status  On-going        PT Long Term Goals - 12/24/17 1453      PT LONG TERM GOAL #1   Title  independent with HEP and understand how to progress herselt    Time  8    Period  Weeks  Status  On-going            Plan - 12/24/17 1525    Clinical Impression Statement  Patient able to walk with turning her head side to side. When walking with looking up and down lost balance and regained it on her own.  Patient is doing better with tandem stance.  Patient has increased lumbar pain when she lays on her back so stopped do the supine  exercises.  Patient had shortness of breath with walking 40 feet with head movements.  Patient able to do 8 min on Nustep compared to 5.  Patient will benefit from skilled therapy to improve strength and balance while monitoring her back pain.     Rehab Potential  Excellent    Clinical Impairments Affecting Rehab Potential  s/p right breast cancer 05/27/2017; right masectomy 07/02/2017; fibromyalgia,     PT Frequency  2x / week    PT Duration  8 weeks    PT Treatment/Interventions  Biofeedback;Gait training;Therapeutic activities;Therapeutic exercise;Balance training;Patient/family education;Neuromuscular re-education;Manual techniques    PT Next Visit Plan   balance exercises; nustep 8 min, LE strength;walk around the gym timed; Berg balance score    PT Home Exercise Plan  progress as needed    Consulted and Agree with Plan of Care  Patient       Patient will benefit from skilled therapeutic intervention in order to improve the following deficits and impairments:  Abnormal gait, Increased fascial restricitons, Decreased mobility, Increased muscle spasms, Decreased strength, Decreased activity tolerance, Decreased endurance, Decreased balance, Difficulty walking  Visit Diagnosis: Muscle weakness (generalized)  Other abnormalities of gait and mobility     Problem List Patient Active Problem List   Diagnosis Date Noted  . Severe obesity ([place]) 12/02/2017  . Anemia, normocytic normochromic 08/10/2017  . Chest pain 08/10/2017  . Essential hypertension 08/10/2017  . CKD (chronic kidney disease), 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  . Type II diabetes mellitus ([place]) 08/10/2017  . Genetic testing 07/10/2017  . Stage I breast cancer, right ([place]) 07/02/2017  . Family history of breast cancer   . Family history of prostate cancer   . Malignant neoplasm of upper-inner quadrant of right breast in female, estrogen receptor positive ([place]) 05/31/2017  . Synovitis of knee 03/16/2014  . Postop Hypokalemia 08/07/2012   . Postop Hyponatremia 08/06/2012  . OA (osteoarthritis) of knee 08/04/2012    Earlie Counts, PT 12/24/17 3:29 PM   [facility] [address] [place], [place], [address] Phone: [phone]   Fax:  [phone]  Name: Kiara Brown MRN: [id number] Date of Birth: [birth year]

## 2017-12-26 ENCOUNTER — Ambulatory Visit: Payer: Medicare Other | Admitting: Physical Therapy

## 2017-12-26 ENCOUNTER — Encounter: Payer: Self-pay | Admitting: Physical Therapy

## 2017-12-26 DIAGNOSIS — C50211 Malignant neoplasm of upper-inner quadrant of right female breast: Secondary | ICD-10-CM | POA: Diagnosis not present

## 2017-12-26 DIAGNOSIS — M6281 Muscle weakness (generalized): Secondary | ICD-10-CM

## 2017-12-26 DIAGNOSIS — M25512 Pain in left shoulder: Secondary | ICD-10-CM | POA: Diagnosis not present

## 2017-12-26 DIAGNOSIS — Z170 Estrogen receptor positive status [ER+]: Secondary | ICD-10-CM | POA: Diagnosis not present

## 2017-12-26 DIAGNOSIS — R2689 Other abnormalities of gait and mobility: Secondary | ICD-10-CM

## 2017-12-26 DIAGNOSIS — R293 Abnormal posture: Secondary | ICD-10-CM | POA: Diagnosis not present

## 2017-12-26 NOTE — Therapy (Signed)
[facility] [address] [place], [place], [address] Phone: [phone]   Fax:  [phone]  Physical Therapy Treatment  Patient Details  Name: Kiara Brown MRN: [id number] Date of Birth: 1941/08/02 Referring Provider: Dr. Lurline Del   Encounter Date: 12/26/2017  PT End of Session - 12/26/17 1522    Visit Number  6    Date for PT Re-Evaluation  01/30/18    Authorization Type  Medicare BCBS    PT Start Time  1445    PT Stop Time  1523    PT Time Calculation (min)  38 min    Activity Tolerance  Patient tolerated treatment well    Behavior During Therapy  [facility] for tasks assessed/performed       Past Medical History:  Diagnosis Date  . Arthritis    osteo, rheumatoid  . Breast cancer ([place])   . Diverticulosis   . Dysrhythmia    hx rapid heartbeat, no cardiologist  . Family history of breast cancer   . Family history of prostate cancer   . Fibromyalgia    peripheral neuropathy  . Fibromyalgia   . GERD (gastroesophageal reflux disease)   . H/O hiatal hernia   . Hyperlipidemia   . PONV (postoperative nausea and vomiting)    "THROAT WITH EXTREME [id number]"6333, ulnar nerve surgery  . Sciatica   . Shortness of breath dyspnea    with exertion  . UTI (lower urinary tract infection)     Past Surgical History:  Procedure Laterality Date  . ABDOMINAL HYSTERECTOMY    . APPENDECTOMY    . back injection      July 2018- x1  . BREAST SURGERY Left 1986   biopsy, Recent 05/2017 at [facility]- had biopsy on R breast- malignant   . CARDIAC CATHETERIZATION  2009   "no blockage per pt"  . CHOLECYSTECTOMY    . CYSTOSCOPY WITH BIOPSY    . INCONTINENCE SURGERY    . JOINT REPLACEMENT    . KNEE ARTHROSCOPY     bilateral  . KNEE ARTHROSCOPY Left 03/17/2014   Procedure: ARTHROSCOPY LEFT KNEE WITH SYNOVECTOMY;  Surgeon: Gearlean Alf, MD;  [facility];  Service: Orthopedics;  Laterality: Left;  . PORTACATH PLACEMENT  Left 07/02/2017   Procedure: INSERTION PORT-A-CATH;  Surgeon: Excell Seltzer, MD;  Location: [place];  Service: General;  Laterality: Left;  . RIGHT/LEFT HEART CATH AND CORONARY ANGIOGRAPHY N/A 08/30/2017   Procedure: RIGHT/LEFT HEART CATH AND CORONARY ANGIOGRAPHY;  Surgeon: Larey Dresser, MD;  Location: [place] [facility];  Service: Cardiovascular;  Laterality: N/A;  . ROTATOR CUFF REPAIR     right  . SIMPLE MASTECTOMY WITH AXILLARY SENTINEL NODE BIOPSY Right 07/02/2017   Procedure: RIGHT TOTAL  MASTECTOMY WITH AXILLARY SENTINEL LYMPH  NODE BIOPSY;  Surgeon: Excell Seltzer, MD;  Location: [place];  Service: General;  Laterality: Right;  . TOTAL KNEE ARTHROPLASTY  08/04/2012   Procedure: TOTAL KNEE ARTHROPLASTY;  Surgeon: Gearlean Alf, MD;  [facility];  Service: Orthopedics;  Laterality: Left;  . TOTAL KNEE ARTHROPLASTY Right 04/11/2015   Procedure: RIGHT TOTAL KNEE ARTHROPLASTY;  Surgeon: Gaynelle Arabian, MD;  [facility];  Service: Orthopedics;  Laterality: Right;  . ulnar nerve surgery Right 1986    There were no vitals filed for this visit.  Subjective Assessment - 12/26/17 1455    Subjective  My back pain is feeling better.  I feel like I am getting more endurance.  Currently in Pain?  No/denies    Multiple Pain Sites  No         [facility] PT Assessment - 12/26/17 0001      Berg Balance Test   Sit to Stand  Able to stand without using hands and stabilize independently    Standing Unsupported  Able to stand safely 2 minutes    Sitting with Back Unsupported but Feet Supported on Floor or Stool  Able to sit safely and securely 2 minutes    Stand to Sit  Sits safely with minimal use of hands    Transfers  Able to transfer safely, minor use of hands    Standing Unsupported with Eyes Closed  Able to stand 10 seconds safely    Standing Ubsupported with Feet Together  Able to place feet together independently and stand 1 minute safely    From Standing, Reach Forward with  Outstretched Arm  Can reach confidently >25 cm (10")    From Standing Position, Pick up Object from Floor  Able to pick up shoe safely and easily    From Standing Position, Turn to Look Behind Over each Shoulder  Looks behind from both sides and weight shifts well    Turn 360 Degrees  Able to turn 360 degrees safely in 4 seconds or less    Standing Unsupported, Alternately Place Feet on Step/Stool  Able to stand independently and safely and complete 8 steps in 20 seconds    Standing Unsupported, One Foot in Front  Able to place foot tandem independently and hold 30 seconds    Standing on One Leg  Tries to lift leg/unable to hold 3 seconds but remains standing independently    Total Score  53                  [facility] Adult PT Treatment/Exercise - 12/26/17 0001      Neuro Re-ed    Neuro Re-ed Details   walk with looking up and down for 160 feet without loss of balance      Lumbar Exercises: Stretches   Active Hamstring Stretch  Right;Left;2 reps;20 seconds      Knee/Hip Exercises: Standing   Heel Raises  Both;1 set;20 reps    Rocker Board  2 minutes    Other Standing Knee Exercises  walking sideways with green band 40 feet      Knee/Hip Exercises: Seated   Long Arc Quad  Strengthening;Right;Left;10 reps;Weights;2 sets    [employer] Weight  5 lbs.    Knee/Hip Flexion  resisted hip flexion hold 5 sec 10 times each side               PT Short Term Goals - 12/26/17 1501      PT SHORT TERM GOAL #4   Title  Berg balance >/= 50/56 due to increased strength and improved balance    Time  4    Period  Weeks    Status  Achieved      Additional Short Term Goals   Additional Short Term Goals  Yes        PT Long Term Goals - 12/24/17 1453      PT LONG TERM GOAL #1   Title  independent with HEP and understand how to progress herselt    Time  8    Period  Weeks    Status  On-going            Plan - 12/26/17 1456  Clinical Impression Statement  Berg  balance score has increased from 48/56 to 53/56.  Patient had difficulty with side stepping with green band and was short of breath.  Patient did better with walking looking up and down without losing her balance.  Patient will benefit from skilled therapy to improve strength and balance while monitoring her back pain.     Clinical Impairments Affecting Rehab Potential  s/p right breast cancer 05/27/2017; right masectomy 07/02/2017; fibromyalgia,     PT Frequency  2x / week    PT Duration  8 weeks    PT Treatment/Interventions  Biofeedback;Gait training;Therapeutic activities;Therapeutic exercise;Balance training;Patient/family education;Neuromuscular re-education;Manual techniques    PT Next Visit Plan   balance exercises; nustep 8 min, LE strength;walk around the gym timed    PT Home Exercise Plan  progress as needed    Consulted and Agree with Plan of Care  Patient       Patient will benefit from skilled therapeutic intervention in order to improve the following deficits and impairments:  Abnormal gait, Increased fascial restricitons, Decreased mobility, Increased muscle spasms, Decreased strength, Decreased activity tolerance, Decreased endurance, Decreased balance, Difficulty walking  Visit Diagnosis: Muscle weakness (generalized)  Other abnormalities of gait and mobility     Problem List Patient Active Problem List   Diagnosis Date Noted  . Severe obesity ([place]) 12/02/2017  . Anemia, normocytic normochromic 08/10/2017  . Chest pain 08/10/2017  . Essential hypertension 08/10/2017  . CKD (chronic kidney disease), 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  . Type II diabetes mellitus ([place]) 08/10/2017  . Genetic testing 07/10/2017  . Stage I breast cancer, right ([place]) 07/02/2017  . Family history of breast cancer   . Family history of prostate cancer   . Malignant neoplasm of upper-inner quadrant of right breast in female, estrogen receptor positive ([place]) 05/31/2017  . Synovitis of knee 03/16/2014  .  Postop Hypokalemia 08/07/2012  . Postop Hyponatremia 08/06/2012  . OA (osteoarthritis) of knee 08/04/2012    Earlie Counts, PT 12/26/17 3:25 PM   [facility] [address], [place], [place], [address] Phone: [phone]   Fax:  [phone]  Name: Kiara Brown MRN: [id number] Date of Birth: 1941-03-06

## 2017-12-30 ENCOUNTER — Encounter: Payer: Medicare Other | Admitting: Physical Therapy

## 2017-12-30 ENCOUNTER — Encounter: Payer: Self-pay | Admitting: Physical Therapy

## 2017-12-30 ENCOUNTER — Ambulatory Visit: Payer: Medicare Other | Attending: Oncology | Admitting: Physical Therapy

## 2017-12-30 DIAGNOSIS — G8929 Other chronic pain: Secondary | ICD-10-CM | POA: Diagnosis not present

## 2017-12-30 DIAGNOSIS — R293 Abnormal posture: Secondary | ICD-10-CM | POA: Diagnosis not present

## 2017-12-30 DIAGNOSIS — M25512 Pain in left shoulder: Secondary | ICD-10-CM | POA: Insufficient documentation

## 2017-12-30 DIAGNOSIS — R2689 Other abnormalities of gait and mobility: Secondary | ICD-10-CM

## 2017-12-30 DIAGNOSIS — M25511 Pain in right shoulder: Secondary | ICD-10-CM | POA: Insufficient documentation

## 2017-12-30 DIAGNOSIS — M6281 Muscle weakness (generalized): Secondary | ICD-10-CM | POA: Insufficient documentation

## 2017-12-30 DIAGNOSIS — C50211 Malignant neoplasm of upper-inner quadrant of right female breast: Secondary | ICD-10-CM | POA: Diagnosis not present

## 2017-12-30 DIAGNOSIS — Z170 Estrogen receptor positive status [ER+]: Secondary | ICD-10-CM | POA: Diagnosis not present

## 2017-12-30 NOTE — Therapy (Signed)
[facility] [address] [place], [place], [address] Phone: [phone]   Fax:  [phone]  Physical Therapy Treatment  Patient Details  Name: Kiara Brown MRN: [id number] Date of Birth: 1941/11/14 Referring Provider: Dr. Lurline Del   Encounter Date: 12/30/2017  PT End of Session - 12/30/17 1448    Visit Number  7    Date for PT Re-Evaluation  01/30/18    Authorization Type  Medicare BCBS    PT Start Time  1445    PT Stop Time  1523    PT Time Calculation (min)  38 min    Activity Tolerance  Patient tolerated treatment well    Behavior During Therapy  [facility] for tasks assessed/performed       Past Medical History:  Diagnosis Date  . Arthritis    osteo, rheumatoid  . Breast cancer ([place])   . Diverticulosis   . Dysrhythmia    hx rapid heartbeat, no cardiologist  . Family history of breast cancer   . Family history of prostate cancer   . Fibromyalgia    peripheral neuropathy  . Fibromyalgia   . GERD (gastroesophageal reflux disease)   . H/O hiatal hernia   . Hyperlipidemia   . PONV (postoperative nausea and vomiting)    "THROAT WITH EXTREME [id number]"3532, ulnar nerve surgery  . Sciatica   . Shortness of breath dyspnea    with exertion  . UTI (lower urinary tract infection)     Past Surgical History:  Procedure Laterality Date  . ABDOMINAL HYSTERECTOMY    . APPENDECTOMY    . back injection      July 2018- x1  . BREAST SURGERY Left 1986   biopsy, Recent 05/2017 at [facility]- had biopsy on R breast- malignant   . CARDIAC CATHETERIZATION  2009   "no blockage per pt"  . CHOLECYSTECTOMY    . CYSTOSCOPY WITH BIOPSY    . INCONTINENCE SURGERY    . JOINT REPLACEMENT    . KNEE ARTHROSCOPY     bilateral  . KNEE ARTHROSCOPY Left 03/17/2014   Procedure: ARTHROSCOPY LEFT KNEE WITH SYNOVECTOMY;  Surgeon: Gearlean Alf, MD;  [facility];  Service: Orthopedics;  Laterality: Left;  . PORTACATH PLACEMENT  Left 07/02/2017   Procedure: INSERTION PORT-A-CATH;  Surgeon: Excell Seltzer, MD;  Location: [place];  Service: General;  Laterality: Left;  . RIGHT/LEFT HEART CATH AND CORONARY ANGIOGRAPHY N/A 08/30/2017   Procedure: RIGHT/LEFT HEART CATH AND CORONARY ANGIOGRAPHY;  Surgeon: Larey Dresser, MD;  Location: [place] [facility];  Service: Cardiovascular;  Laterality: N/A;  . ROTATOR CUFF REPAIR     right  . SIMPLE MASTECTOMY WITH AXILLARY SENTINEL NODE BIOPSY Right 07/02/2017   Procedure: RIGHT TOTAL  MASTECTOMY WITH AXILLARY SENTINEL LYMPH  NODE BIOPSY;  Surgeon: Excell Seltzer, MD;  Location: [place];  Service: General;  Laterality: Right;  . TOTAL KNEE ARTHROPLASTY  08/04/2012   Procedure: TOTAL KNEE ARTHROPLASTY;  Surgeon: Gearlean Alf, MD;  [facility];  Service: Orthopedics;  Laterality: Left;  . TOTAL KNEE ARTHROPLASTY Right 04/11/2015   Procedure: RIGHT TOTAL KNEE ARTHROPLASTY;  Surgeon: Gaynelle Arabian, MD;  [facility];  Service: Orthopedics;  Laterality: Right;  . ulnar nerve surgery Right 1986    There were no vitals filed for this visit.  Subjective Assessment - 12/30/17 1445    Subjective  I am getting stronger and increased energy. My balance is feeling better. I am seeing the  rhuematologist tomorrow to have my back pain addressed.    Currently in Pain?  Yes    Pain Score  5     Pain Location  Back    Pain Orientation  Lower    Pain Descriptors / Indicators  Sharp    Pain Type  Acute pain    Pain Radiating Towards  down to feet    Pain Onset  More than a month ago    Pain Frequency  Constant    Aggravating Factors   night time goes to 6/10    Pain Relieving Factors  pain patch    Multiple Pain Sites  No                      [facility] Adult PT Treatment/Exercise - 12/30/17 0001      Lumbar Exercises: Stretches   Gastroc Stretch  3 reps;30 seconds      Knee/Hip Exercises: Aerobic   Nustep  L2 x 8 min Seat #7; no arms PTA present for status update       Knee/Hip Exercises: Plyometrics   Other Plyometric Exercises  stand with pulling yellow band sideways in tandem stance 10x each way      Knee/Hip Exercises: Standing   Heel Raises  Both;1 set;20 reps    Functional Squat  5 reps pulling red band down    Other Standing Knee Exercises  walking sideways with green band 40 feet    Other Standing Knee Exercises  walk through ladder and pick up feet with c.g for 6 times;       Knee/Hip Exercises: Seated   Long Arc Quad  Strengthening;Right;Left;10 reps;Weights;2 sets    Long Arc Quad Weight  5 lbs.    Ball Squeeze  hold 15 sec 10x    Knee/Hip Flexion  resisted hip flexion hold 5 sec 10 times each side    Hamstring Curl  Strengthening;Right;Left;2 sets;10 reps blue band               PT Short Term Goals - 12/26/17 1501      PT SHORT TERM GOAL #4   Title  Berg balance >/= 50/56 due to increased strength and improved balance    Time  4    Period  Weeks    Status  Achieved      Additional Short Term Goals   Additional Short Term Goals  Yes        PT Long Term Goals - 12/24/17 1453      PT LONG TERM GOAL #1   Title  independent with HEP and understand how to progress herselt    Time  8    Period  Weeks    Status  On-going            Plan - 12/30/17 1525    Clinical Impression Statement  Patient has difficulty with picking up her feet when walking over the floor ladder. Patient get fatiqued with walking sideways with green band on thighs.  Patient is not being challenged with balance exercise for arm movements. Patient is feeling stronger and has increased endurance. Patient will benefit from skilled therapy  to improve strength and balance while monitoring her back pain.     Rehab Potential  Excellent    Clinical Impairments Affecting Rehab Potential  s/p right breast cancer 05/27/2017; right masectomy 07/02/2017; fibromyalgia,     PT Frequency  2x / week    PT Duration  8 weeks  PT Treatment/Interventions   Biofeedback;Gait training;Therapeutic activities;Therapeutic exercise;Balance training;Patient/family education;Neuromuscular re-education;Manual techniques    PT Next Visit Plan   balance exercises; nustep 8 min, LE strength;walk around the gym timed    PT Home Exercise Plan  progress as needed    Consulted and Agree with Plan of Care  Patient       Patient will benefit from skilled therapeutic intervention in order to improve the following deficits and impairments:  Abnormal gait, Increased fascial restricitons, Decreased mobility, Increased muscle spasms, Decreased strength, Decreased activity tolerance, Decreased endurance, Decreased balance, Difficulty walking  Visit Diagnosis: Muscle weakness (generalized)  Other abnormalities of gait and mobility  Abnormal posture     Problem List Patient Active Problem List   Diagnosis Date Noted  . Severe obesity ([place]) 12/02/2017  . Anemia, normocytic normochromic 08/10/2017  . Chest pain 08/10/2017  . Essential hypertension 08/10/2017  . CKD (chronic kidney disease), 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  . Type II diabetes mellitus ([place]) 08/10/2017  . Genetic testing 07/10/2017  . Stage I breast cancer, right ([place]) 07/02/2017  . Family history of breast cancer   . Family history of prostate cancer   . Malignant neoplasm of upper-inner quadrant of right breast in female, estrogen receptor positive ([place]) 05/31/2017  . Synovitis of knee 03/16/2014  . Postop Hypokalemia 08/07/2012  . Postop Hyponatremia 08/06/2012  . OA (osteoarthritis) of knee 08/04/2012    Earlie Counts, PT 12/30/17 3:29 PM   [facility] [address] [place], [place], [address] Phone: [phone]   Fax:  [phone]  Name: Kiara Brown MRN: [id number] Date of Birth: 11/02/1941

## 2017-12-31 DIAGNOSIS — M0609 Rheumatoid arthritis without rheumatoid factor, multiple sites: Secondary | ICD-10-CM | POA: Diagnosis not present

## 2017-12-31 DIAGNOSIS — Z79899 Other long term (current) drug therapy: Secondary | ICD-10-CM | POA: Diagnosis not present

## 2017-12-31 DIAGNOSIS — M5136 Other intervertebral disc degeneration, lumbar region: Secondary | ICD-10-CM | POA: Diagnosis not present

## 2017-12-31 DIAGNOSIS — M159 Polyosteoarthritis, unspecified: Secondary | ICD-10-CM | POA: Diagnosis not present

## 2017-12-31 NOTE — Telephone Encounter (Signed)
Oral Oncology Patient Advocate Encounter  Contacted [employer] to follow up on the status of the patient's renewal application for continued assistance in obtaining [place].   The application is still being processed.  I was able to confirm with the representative that they did have all documentation necessary to fully process the application.   Fabio Asa. [name], [facility] [place] [phone] 12/31/2017 3:27 PM

## 2018-01-01 ENCOUNTER — Encounter: Payer: Self-pay | Admitting: Physical Therapy

## 2018-01-01 ENCOUNTER — Ambulatory Visit: Payer: Medicare Other | Admitting: Physical Therapy

## 2018-01-01 DIAGNOSIS — Z170 Estrogen receptor positive status [ER+]: Secondary | ICD-10-CM | POA: Diagnosis not present

## 2018-01-01 DIAGNOSIS — M6281 Muscle weakness (generalized): Secondary | ICD-10-CM

## 2018-01-01 DIAGNOSIS — R2689 Other abnormalities of gait and mobility: Secondary | ICD-10-CM

## 2018-01-01 DIAGNOSIS — M25512 Pain in left shoulder: Secondary | ICD-10-CM | POA: Diagnosis not present

## 2018-01-01 DIAGNOSIS — C50211 Malignant neoplasm of upper-inner quadrant of right female breast: Secondary | ICD-10-CM | POA: Diagnosis not present

## 2018-01-01 DIAGNOSIS — R293 Abnormal posture: Secondary | ICD-10-CM | POA: Diagnosis not present

## 2018-01-01 NOTE — Therapy (Signed)
[facility] [address], [place], [place], [address] Phone: [phone]   Fax:  [phone]  Physical Therapy Treatment  Patient Details  Name: Kiara [place] MRN: [id number] Date of Birth: 30-Oct-1941 Referring Provider: Dr. Lurline Del   Encounter Date: 01/01/2018  PT End of Session - 01/01/18 1141    Visit Number  8    Date for PT Re-Evaluation  01/30/18    Authorization Type  Medicare BCBS    PT Start Time  1100    PT Stop Time  1141    PT Time Calculation (min)  41 min    Activity Tolerance  Patient tolerated treatment well    Behavior During Therapy  [facility] for tasks assessed/performed       Past Medical History:  Diagnosis Date  . Arthritis    osteo, rheumatoid  . Breast cancer ([place])   . Diverticulosis   . Dysrhythmia    hx rapid heartbeat, no cardiologist  . Family history of breast cancer   . Family history of prostate cancer   . Fibromyalgia    peripheral neuropathy  . Fibromyalgia   . GERD (gastroesophageal reflux disease)   . H/O hiatal hernia   . Hyperlipidemia   . PONV (postoperative nausea and vomiting)    "THROAT WITH EXTREME [id number]"9924, ulnar nerve surgery  . Sciatica   . Shortness of breath dyspnea    with exertion  . UTI (lower urinary tract infection)     Past Surgical History:  Procedure Laterality Date  . ABDOMINAL HYSTERECTOMY    . APPENDECTOMY    . back injection      July 2018- x1  . BREAST SURGERY Left 1986   biopsy, Recent 05/2017 at [facility]- had biopsy on R breast- malignant   . CARDIAC CATHETERIZATION  2009   "no blockage per pt"  . CHOLECYSTECTOMY    . CYSTOSCOPY WITH BIOPSY    . INCONTINENCE SURGERY    . JOINT REPLACEMENT    . KNEE ARTHROSCOPY     bilateral  . KNEE ARTHROSCOPY Left 03/17/2014   Procedure: ARTHROSCOPY LEFT KNEE WITH SYNOVECTOMY;  Surgeon: Gearlean Alf, MD;  [facility];  Service: Orthopedics;  Laterality: Left;  . PORTACATH PLACEMENT  Left 07/02/2017   Procedure: INSERTION PORT-A-CATH;  Surgeon: Excell Seltzer, MD;  Location: [place];  Service: General;  Laterality: Left;  . RIGHT/LEFT HEART CATH AND CORONARY ANGIOGRAPHY N/A 08/30/2017   Procedure: RIGHT/LEFT HEART CATH AND CORONARY ANGIOGRAPHY;  Surgeon: Larey Dresser, MD;  Location: [place] [facility];  Service: Cardiovascular;  Laterality: N/A;  . ROTATOR CUFF REPAIR     right  . SIMPLE MASTECTOMY WITH AXILLARY SENTINEL NODE BIOPSY Right 07/02/2017   Procedure: RIGHT TOTAL  MASTECTOMY WITH AXILLARY SENTINEL LYMPH  NODE BIOPSY;  Surgeon: Excell Seltzer, MD;  Location: [place];  Service: General;  Laterality: Right;  . TOTAL KNEE ARTHROPLASTY  08/04/2012   Procedure: TOTAL KNEE ARTHROPLASTY;  Surgeon: Gearlean Alf, MD;  [facility];  Service: Orthopedics;  Laterality: Left;  . TOTAL KNEE ARTHROPLASTY Right 04/11/2015   Procedure: RIGHT TOTAL KNEE ARTHROPLASTY;  Surgeon: Gaynelle Arabian, MD;  [facility];  Service: Orthopedics;  Laterality: Right;  . ulnar nerve surgery Right 1986    There were no vitals filed for this visit.  Subjective Assessment - 01/01/18 1112    Subjective  I was tired after last visit.     Patient Stated Goals  increase balance  Currently in Pain?  No/denies                      [facility] Adult PT Treatment/Exercise - 01/01/18 0001      Knee/Hip Exercises: Aerobic   Nustep  L2 x 8 min Seat #7; no arms PTA present for status update      Knee/Hip Exercises: Standing   Heel Raises  Both;1 set;20 reps one foot 10x with other foot on top step    Rocker Board  2 minutes    Other Standing Knee Exercises  walk through ladder and pick up feet with c.g for 4 times; and sideways      Knee/Hip Exercises: Seated   Long Arc Quad  Strengthening;Right;Left;10 reps;Weights;2 sets    [employer] Weight  5 lbs.    Ball Squeeze  hold 10 sec 15x    Knee/Hip Flexion  resisted hip flexion hold 5 sec 10 times each side    Sit to Sand   10 reps;without UE support holding red plyoball               PT Short Term Goals - 12/26/17 1501      PT SHORT TERM GOAL #4   Title  Berg balance >/= 50/56 due to increased strength and improved balance    Time  4    Period  Weeks    Status  Achieved      Additional Short Term Goals   Additional Short Term Goals  Yes        PT Long Term Goals - 12/24/17 1453      PT LONG TERM GOAL #1   Title  independent with HEP and understand how to progress herselt    Time  8    Period  Weeks    Status  On-going            Plan - 01/01/18 1141    Clinical Impression Statement  Patient has increased endurance with exercise.  Patient was not short of breath with exericse.  Patient has difficulty with lifting up her left leg when walking in the ladder.  Patient will benefit  form skilled therapy to improve strength and balance while monitoring her back pain.     Rehab Potential  Excellent    Clinical Impairments Affecting Rehab Potential  s/p right breast cancer 05/27/2017; right masectomy 07/02/2017; fibromyalgia,     PT Frequency  2x / week    PT Duration  8 weeks    PT Treatment/Interventions  Biofeedback;Gait training;Therapeutic activities;Therapeutic exercise;Balance training;Patient/family education;Neuromuscular re-education;Manual techniques    PT Next Visit Plan   balance exercises; nustep 8 min, LE strength;walk around the gym timed    PT Home Exercise Plan  progress as needed    Consulted and Agree with Plan of Care  Patient       Patient will benefit from skilled therapeutic intervention in order to improve the following deficits and impairments:  Abnormal gait, Increased fascial restricitons, Decreased mobility, Increased muscle spasms, Decreased strength, Decreased activity tolerance, Decreased endurance, Decreased balance, Difficulty walking  Visit Diagnosis: Muscle weakness (generalized)  Other abnormalities of gait and mobility     Problem List Patient  Active Problem List   Diagnosis Date Noted  . Severe obesity ([place]) 12/02/2017  . Anemia, normocytic normochromic 08/10/2017  . Chest pain 08/10/2017  . Essential hypertension 08/10/2017  . CKD (chronic kidney disease), 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  . Type II diabetes mellitus ([place]) 08/10/2017  .  Genetic testing 07/10/2017  . Stage I breast cancer, right ([place]) 07/02/2017  . Family history of breast cancer   . Family history of prostate cancer   . Malignant neoplasm of upper-inner quadrant of right breast in female, estrogen receptor positive ([place]) 05/31/2017  . Synovitis of knee 03/16/2014  . Postop Hypokalemia 08/07/2012  . Postop Hyponatremia 08/06/2012  . OA (osteoarthritis) of knee 08/04/2012    Earlie Counts, PT 01/01/18 11:43 AM   [facility] [address], [place], [place], [address] Phone: [phone]   Fax:  [phone]  Name: ZAMORA COLTON MRN: [id number] Date of Birth: [birth year]

## 2018-01-06 ENCOUNTER — Ambulatory Visit: Payer: Medicare Other | Admitting: Physical Therapy

## 2018-01-06 ENCOUNTER — Encounter: Payer: Self-pay | Admitting: Physical Therapy

## 2018-01-06 DIAGNOSIS — Z170 Estrogen receptor positive status [ER+]: Secondary | ICD-10-CM

## 2018-01-06 DIAGNOSIS — M25512 Pain in left shoulder: Secondary | ICD-10-CM | POA: Diagnosis not present

## 2018-01-06 DIAGNOSIS — R2689 Other abnormalities of gait and mobility: Secondary | ICD-10-CM | POA: Diagnosis not present

## 2018-01-06 DIAGNOSIS — C50211 Malignant neoplasm of upper-inner quadrant of right female breast: Secondary | ICD-10-CM

## 2018-01-06 DIAGNOSIS — M6281 Muscle weakness (generalized): Secondary | ICD-10-CM | POA: Diagnosis not present

## 2018-01-06 DIAGNOSIS — R293 Abnormal posture: Secondary | ICD-10-CM | POA: Diagnosis not present

## 2018-01-06 DIAGNOSIS — G8929 Other chronic pain: Secondary | ICD-10-CM

## 2018-01-06 DIAGNOSIS — M25511 Pain in right shoulder: Secondary | ICD-10-CM

## 2018-01-06 NOTE — Progress Notes (Signed)
[place]  [phone] Fax:([phone]     ID: Kiara Brown DOB: 1941/03/17  MR#: [id number]  VOJ#:[id number]  Patient Care Team: Burman Freestone, MD as PCP - General (Internal Medicine) Excell Seltzer, MD as Consulting Physician (General Surgery) Venora Kautzman, Virgie Dad, MD as Consulting Physician (Oncology) Gery Pray, MD as Consulting Physician (Radiation Oncology) [place] Crane, MD as Referring Physician (Internal Medicine) Bensimhon, Shaune Pascal, MD as Consulting Physician (Cardiology) OTHER MD:  CHIEF COMPLAINT: Estrogen receptor positive breast cancer  CURRENT TREATMENT:anastrozole, lapatinib   BREAST CANCER HISTORY: From the original intake note:  "Kiara Brown" herself noted a change around her nipple on the right and brought it to medical attention. She was set up for bilateral diagnostic mammography with tomography and right breast ultrasonography at [facility] 05/23/2017. The breast density was category C. In the right breast upper inner quadrant there was a 2.5 cm area of grouped pleomorphic calcifications correlating with the palpable mass. Also there was a 1.2 cm macrolobulated mass in the upper inner quadrant also in the palpable region. These masses were identifiable by ultrasound with the same dimensions. The right axilla was sonographically negative.  On 05/27/2017 the patient underwent biopsy of the 2 masses in question. 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  The patient's subsequent history is as detailed below.  INTERVAL  HISTORY: Kiara Brown is here today for evaluation and treatment of her triple positive breast cancer. She continues on anastrozole, with good tolerance. She has mild hot flashes and vaginal dryness. She was evaluated by Earlie Counts in the [facility] and she was recommended some exercises and solutions to aid with this.  She started on the [place]ker[place] and has had good tolerance without a rash or diarrhea. She denies paying anything for this medication.      REVIEW OF SYSTEMS: Kiara Brown reports that for exercise, she walks and completes the exercises that were given to her by the physical therapist. She is in physical therapy and she doesn't use a cane at this time and she reports that she has fallen recently. 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 She has been placed on gabapentin to try and alleviate her symptoms. She report that the gabapentin isn't working at this time. She has increased insomnia due to the neuropathy. She denies unusual headaches, visual changes, nausea, vomiting, or dizziness. There has been no unusual cough, phlegm production, or pleurisy. This been no change in bowel or bladder habits. She denies unexplained fatigue or unexplained weight loss, bleeding, rash, or fever. A detailed review of systems was otherwise stable.     PAST MEDICAL HISTORY: Past Medical History:  Diagnosis Date  . Arthritis    osteo, rheumatoid  . Breast cancer ([place])   . Diverticulosis   . Dysrhythmia    hx rapid heartbeat, no cardiologist  . Family history of breast cancer   . Family history of prostate cancer   . Fibromyalgia    peripheral neuropathy  . Fibromyalgia   . GERD (gastroesophageal reflux disease)   .  H/O hiatal hernia   . Hyperlipidemia   . PONV (postoperative nausea and vomiting)    "THROAT WITH EXTREME [id number], ulnar nerve surgery  . Sciatica   . Shortness of breath dyspnea    with exertion  . UTI (lower  urinary tract infection)     PAST SURGICAL HISTORY: Past Surgical History:  Procedure Laterality Date  . ABDOMINAL HYSTERECTOMY    . APPENDECTOMY    . back injection      July 2018- x1  . BREAST SURGERY Left 1986   biopsy, Recent 05/2017 at [facility]- had biopsy on R breast- malignant   . CARDIAC CATHETERIZATION  2009   "no blockage per pt"  . CHOLECYSTECTOMY    . CYSTOSCOPY WITH BIOPSY    . INCONTINENCE SURGERY    . JOINT REPLACEMENT    . KNEE ARTHROSCOPY     bilateral  . KNEE ARTHROSCOPY Left 03/17/2014   Procedure: ARTHROSCOPY LEFT KNEE WITH SYNOVECTOMY;  Surgeon: Gearlean Alf, MD;  [facility];  Service: Orthopedics;  Laterality: Left;  . PORTACATH PLACEMENT Left 07/02/2017   Procedure: INSERTION PORT-A-CATH;  Surgeon: Excell Seltzer, MD;  Location: [place];  Service: General;  Laterality: Left;  . RIGHT/LEFT HEART CATH AND CORONARY ANGIOGRAPHY N/A 08/30/2017   Procedure: RIGHT/LEFT HEART CATH AND CORONARY ANGIOGRAPHY;  Surgeon: Larey Dresser, MD;  Location: [place] [facility];  Service: Cardiovascular;  Laterality: N/A;  . ROTATOR CUFF REPAIR     right  . SIMPLE MASTECTOMY WITH AXILLARY SENTINEL NODE BIOPSY Right 07/02/2017   Procedure: RIGHT TOTAL  MASTECTOMY WITH AXILLARY SENTINEL LYMPH  NODE BIOPSY;  Surgeon: Excell Seltzer, MD;  Location: [place];  Service: General;  Laterality: Right;  . TOTAL KNEE ARTHROPLASTY  08/04/2012   Procedure: TOTAL KNEE ARTHROPLASTY;  Surgeon: Gearlean Alf, MD;  [facility];  Service: Orthopedics;  Laterality: Left;  . TOTAL KNEE ARTHROPLASTY Right 04/11/2015   Procedure: RIGHT TOTAL KNEE ARTHROPLASTY;  Surgeon: Gaynelle Arabian, MD;  [facility];  Service: Orthopedics;  Laterality: Right;  . ulnar nerve surgery Right 1986    FAMILY HISTORY Family History  Problem Relation Age of Onset  . Hypertension Mother   . Stroke Mother   . Breast cancer Mother 29       died at 35  . Hypertension Father   . Heart attack Father         died at 64  . Hypertension Brother        MI  . Prostate cancer Brother 67  . Hypertension Brother        MI  . Lung cancer Brother        possible lung cancer, spot found on lung being monitored  . Hypertension Brother        MI  . Non-Hodgkin's lymphoma Sister 50  . Breast cancer Other 18       is currently 90  . Breast cancer Other 55  . Cancer Cousin 62       type of cancer unk.   . Cancer Other        type unknown, eye/face   . Cancer Other 46       died from cancer in his 8's, type unknown   The patient's father died from a heart attack at age 79. The patient's mother was diagnosed with breast cancer at age 1 and died at age 24. The patient had 5 brothers, 3 sisters. One sister had breast cancer at age  21. One brother was diagnosed with prostate cancer at age 27. One Brother died from lung cancer at age 49. One sister had non-Hodgkin's lymphoma diagnosed at age 19.   GYNECOLOGIC HISTORY:  No LMP recorded. Patient has had a hysterectomy.  menarche age 35, first live birth age 38. All 3 of her births were premature including 1 set of twins. One of the twins died a few hours after birth. She underwent hysterectomy in 1981 with unilateral salpingo-oophorectomy. 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  She has led groups as far away as [place] and [place] and has led many in the [place] cruises as well.  Her husband, Berneta Sages began a business installing fences which is now carried on by his son Dorothea Ogle. Berneta Sages is also the church organist. Son, Darnelle Maffucci is a truck Geophysicist/field seismologist in [employer]. The patient has one grandchild. She is a Tourist information centre manager.     ADVANCED DIRECTIVES: In place. The patient's husband is her healthcare power of attorney   HEALTH MAINTENANCE: Social History   Tobacco Use  . Smoking status: Never Smoker  . Smokeless tobacco: Never Used  Substance Use  Topics  . Alcohol use: No  . Drug use: No     Colonoscopy:2017 / [place]   PAP:  Bone density:June 2014, normal    Allergies  Allergen Reactions  . Duloxetine Other (See Comments)    UNSPECIFIED REACTION   . Nsaids Rash  . Oseltamivir Diarrhea and Other (See Comments)    Abdominal pain     Current Outpatient Medications  Medication Sig Dispense Refill  . acetaminophen (TYLENOL) 500 MG tablet Take 500 mg by mouth every 6 (six) hours as needed for moderate pain or headache.     . albuterol (PROVENTIL HFA;VENTOLIN HFA) 108 (90 Base) MCG/ACT inhaler Inhale 2 puffs into the lungs every 6 (six) hours as needed for wheezing or shortness of breath. 1 Inhaler 2  . amitriptyline (ELAVIL) 10 MG tablet Take 10 mg by mouth at bedtime.     Marland Kitchen anastrozole (ARIMIDEX) 1 MG tablet Take 1 tablet (1 mg total) by mouth daily. 90 tablet 4  . aspirin EC 81 MG tablet Take 81 mg by mouth daily.    Marland Kitchen atenolol (TENORMIN) 50 MG tablet Take 50 mg by mouth daily before breakfast.     . atorvastatin (LIPITOR) 80 MG tablet Take 1 tablet (80 mg total) by mouth daily. 30 tablet 3  . buPROPion (WELLBUTRIN XL) 150 MG 24 hr tablet Take 150 mg by mouth daily before breakfast.    . folic acid (FOLVITE) 1 MG tablet Take 1 mg by mouth daily.    . furosemide (LASIX) 20 MG tablet Take 40 mg by mouth daily.    . isosorbide mononitrate (IMDUR) 30 MG 24 hr tablet Take 0.5 tablets (15 mg total) by mouth daily. 15 tablet 3  . lapatinib (TYKERB) 250 MG tablet Take 3 tablets (750 mg total) daily by mouth. Take on an empty stomach, 1 hour before or 2 hours after food. 90 tablet 6  . omeprazole (PRILOSEC) 40 MG capsule Take 40 mg by mouth every morning.    . ondansetron (ZOFRAN) 8 MG tablet Take 1 tablet (8 mg total) by mouth every 8 (eight) hours as needed for nausea or vomiting. 20 tablet 0  . Potassium Chloride ER 20 MEQ TBCR Take 20 mg by mouth daily at  2 PM. 30 tablet 6   No current facility-administered medications for  this visit.     OBJECTIVE: Middle-aged white woman in no acute distress  Vitals:   01/13/18 1248  BP: (!) 145/78  Pulse: 74  Resp: 20  Temp: 97.8 F (36.6 C)  SpO2: 100%     Body mass index is 38.48 kg/m.  Kiara Brown Weights   01/13/18 1248  Weight: 253 lb 1.6 oz (114.8 kg)     ECOG FS:2 - Symptomatic, <50% confined to bed  Sclerae unicteric, EOMs intact Oropharynx clear and moist No cervical or supraclavicular adenopathy Lungs no rales or rhonchi Heart regular rate and rhythm Abd soft, nontender, positive bowel sounds MSK no focal spinal tenderness, no upper extremity lymphedema Neuro: nonfocal, well oriented, appropriate affect Breasts: The right breast is status post mastectomy.  There is no evidence of chest wall recurrence.  The left breast is unremarkable.  Both axillae are benign     LAB RESULTS:  CMP     Component Value Date/Time   NA 137 01/13/2018 1221   NA 131 (L) 09/06/2017 1018   K 3.6 01/13/2018 1221   K 4.2 09/06/2017 1018   CL 101 01/13/2018 1221   CO2 24 01/13/2018 1221   CO2 25 09/06/2017 1018   GLUCOSE 160 (H) 01/13/2018 1221   GLUCOSE 187 (H) 09/06/2017 1018   BUN 15 01/13/2018 1221   BUN 11.2 09/06/2017 1018   CREATININE 1.21 (H) 01/13/2018 1221   CREATININE 1.2 (H) 09/06/2017 1018   CALCIUM 9.4 01/13/2018 1221   CALCIUM 9.3 09/06/2017 1018   PROT 7.2 01/13/2018 1221   PROT 6.9 09/06/2017 1018   ALBUMIN 3.4 (L) 01/13/2018 1221   ALBUMIN 3.4 (L) 09/06/2017 1018   AST 15 01/13/2018 1221   AST 20 09/06/2017 1018   ALT 11 01/13/2018 1221   ALT 14 09/06/2017 1018   ALKPHOS 110 01/13/2018 1221   ALKPHOS 103 09/06/2017 1018   BILITOT 0.5 01/13/2018 1221   BILITOT 0.57 09/06/2017 1018   GFRNONAA 42 (L) 01/13/2018 1221   GFRAA 49 (L) 01/13/2018 1221    No results found for: TOTALPROTELP, ALBUMINELP, A1GS, A2GS, BETS, BETA2SER, GAMS, MSPIKE, SPEI  No results found for: Nils Pyle, [facility]  Lab Results   Component Value Date   WBC 6.5 01/13/2018   NEUTROABS 3.9 01/13/2018   HGB 10.2 (L) 01/13/2018   HCT 32.2 (L) 01/13/2018   MCV 80.6 01/13/2018   PLT 357 01/13/2018      Chemistry      Component Value Date/Time   NA 137 01/13/2018 1221   NA 131 (L) 09/06/2017 1018   K 3.6 01/13/2018 1221   K 4.2 09/06/2017 1018   CL 101 01/13/2018 1221   CO2 24 01/13/2018 1221   CO2 25 09/06/2017 1018   BUN 15 01/13/2018 1221   BUN 11.2 09/06/2017 1018   CREATININE 1.21 (H) 01/13/2018 1221   CREATININE 1.2 (H) 09/06/2017 1018      Component Value Date/Time   CALCIUM 9.4 01/13/2018 1221   CALCIUM 9.3 09/06/2017 1018   ALKPHOS 110 01/13/2018 1221   ALKPHOS 103 09/06/2017 1018   AST 15 01/13/2018 1221   AST 20 09/06/2017 1018   ALT 11 01/13/2018 1221   ALT 14 09/06/2017 1018   BILITOT 0.5 01/13/2018 1221   BILITOT 0.57 09/06/2017 1018       No results found for: LABCA2  No components found for: [id number]  No results for input(s): INR in  the last 168 hours.  Urinalysis    Component Value Date/Time   COLORURINE STRAW (A) 08/10/2017 1954   APPEARANCEUR CLEAR 08/10/2017 1954   LABSPEC 1.004 (L) 08/10/2017 1954   PHURINE 6.0 08/10/2017 1954   GLUCOSEU NEGATIVE 08/10/2017 1954   HGBUR SMALL (A) 08/10/2017 [place] NEGATIVE 08/10/2017 [place] NEGATIVE 08/10/2017 1954   PROTEINUR NEGATIVE 08/10/2017 1954   UROBILINOGEN 0.2 04/06/2015 1332   NITRITE NEGATIVE 08/10/2017 1954   LEUKOCYTESUR LARGE (A) 08/10/2017 1954     STUDIES: Will resume yearly mammography in June  ELIGIBLE FOR AVAILABLE RESEARCH PROTOCOL: No  ASSESSMENT: 77 y.o. [place] woman status post right breast upper inner quadrant biopsy 2 on 05/27/2017 showing a clinical T1c N0, stage IA invasive ductal carcinoma, grade 3, estrogen and progesterone receptor positive, HER-2 amplified, with an MIB-1 of 60%  (1) genetics testing 07/14/2017 through the Multi-Cancer panel + Breast Cancer Panel  found no deleterious mutations in  ALK, APC,AKT1, ATM, AXIN2,BAP1,  BARD1, BLM, BMPR1A, BRCA1, BRCA2, BRIP1, CASR, CDC73, CDH1, CDK4, CDKN1B, CDKN1C, CDKN2A (p14ARF), CDKN2A (p16INK4a), CEBPA, CHEK2, CTNNA1, DICER1, DIS3L2, EGFR (c.2369C>T, p.Thr790Met variant only), EPCAM (Deletion/duplication testing only), FH, FAM175A, FLCN, GATA2, GPC3, GREM1 (Promoter region deletion/duplication testing only), HOXB13 (c.251G>A, p.Gly84Glu), HRAS, KIT, MAX, MEN1, MET, MITF (c.952G>A, p.Glu318Lys variant only), MLH1, MSH2, MSH3, MSH6, MUTYH, NBN, NF1, NF2, NTHL1, PALB2, PDGFRA, PHOX2B, PMS2, POLD1, POLE, POT1, PRKAR1A, PTCH1, PTEN, RAD50, RAD51C, RAD51D, RB1, RECQL4, RET, RUNX1, SDHAF2, SDHA (sequence changes only), SDHB, SDHC, SDHD, SMAD4, SMARCA4, SMARCB1, SMARCE1, STK11, SUFU, TERC, TERT, TMEM127, TP53, TSC1, TSC2, VHL, WRN, WT1, FANCC,MRE11,PIK3CA,RINT1,XRCC2.  (a)  3 Variants of Uncertain significance (VUS) were identified.  BRCA1 c.2551G>A (p.Glu851Lys)----CHEK2 c.679G>A (p.Gly227Arg).  The CHECK 2 variant has been reported as possibly mosaic--- SDHA c.704T>C (p.Ile235Thr).  (2) anastrozole started 06/05/2017, to be continued and minimum of 5 years   (3) status post right mastectomy and sentinel lymph node sampling 07/02/2017 for an mpT2 pN0, stage IB invasive ductal carcinoma, grade 3, with negative margins.  (a) the patient opted against reconstruction  (4) trastuzumab for 6 months started 08/09/2017, discontinued after one dose with significant reaction  (a) baseline echocardiogram 06/19/2017 shows an excellent ejection fraction  (b) Echocardiogram 09/16/2017 showed an ejection fraction of 60-65%  (5) started lapatinib 12/02/2017  PLAN: Kiara Brown is tolerating the lapatinib much better than the trastuzumab.  In fact she is having no side effects from the lapatinib at all.  This is very favorable.  Plan is to continue the lapatinib for a total of 6 months  She will of course continue the anastrozole for  a total of 5 years.  She is due for an echocardiogram this is been scheduled for sometime next week.  I suggested she increase the Neurontin at bedtime to 200 mg to see if that helps with the bedtime neuropathy she is experiencing.  Otherwise she will see me again in 3 months.  She knows to call for any problems that may develop before that visit.  Hye Trawick, Virgie Dad, MD  01/13/18 1:13 PM Medical Oncology and Hematology [facility] [address] [place], [place] [address] Tel. [phone]    Fax. [phone]    This document serves as a record of services personally performed by Lurline Del, MD. It was created on his behalf by Steva Colder, a trained medical scribe. The creation of this record is based on the scribe's personal observations and the provider's statements to them.   I have reviewed  the above documentation for accuracy and completeness, and I agree with the above.

## 2018-01-06 NOTE — Therapy (Signed)
[facility] [address], [place], [place], [address] Phone: [phone]   Fax:  [phone]  Physical Therapy Treatment  Patient Details  Name: Kiara Brown MRN: [id number] Date of Birth: 26-Jul-1941 Referring Provider: Dr. Lurline Del   Encounter Date: 01/06/2018  PT End of Session - 01/06/18 1108    Visit Number  9    Date for PT Re-Evaluation  01/30/18    Authorization Type  Medicare BCBS    PT Start Time  1107    PT Stop Time  1145    PT Time Calculation (min)  38 min    Activity Tolerance  Patient tolerated treatment well    Behavior During Therapy  [facility] for tasks assessed/performed       Past Medical History:  Diagnosis Date  . Arthritis    osteo, rheumatoid  . Breast cancer ([place])   . Diverticulosis   . Dysrhythmia    hx rapid heartbeat, no cardiologist  . Family history of breast cancer   . Family history of prostate cancer   . Fibromyalgia    peripheral neuropathy  . Fibromyalgia   . GERD (gastroesophageal reflux disease)   . H/O hiatal hernia   . Hyperlipidemia   . PONV (postoperative nausea and vomiting)    "THROAT WITH EXTREME [id number], ulnar nerve surgery  . Sciatica   . Shortness of breath dyspnea    with exertion  . UTI (lower urinary tract infection)     Past Surgical History:  Procedure Laterality Date  . ABDOMINAL HYSTERECTOMY    . APPENDECTOMY    . back injection      July 2018- x1  . BREAST SURGERY Left 1986   biopsy, Recent 05/2017 at [facility]- had biopsy on R breast- malignant   . CARDIAC CATHETERIZATION  2009   "no blockage per pt"  . CHOLECYSTECTOMY    . CYSTOSCOPY WITH BIOPSY    . INCONTINENCE SURGERY    . JOINT REPLACEMENT    . KNEE ARTHROSCOPY     bilateral  . KNEE ARTHROSCOPY Left 03/17/2014   Procedure: ARTHROSCOPY LEFT KNEE WITH SYNOVECTOMY;  Surgeon: Gearlean Alf, MD;  [facility];  Service: Orthopedics;  Laterality: Left;  . PORTACATH PLACEMENT  Left 07/02/2017   Procedure: INSERTION PORT-A-CATH;  Surgeon: Excell Seltzer, MD;  Location: [place];  Service: General;  Laterality: Left;  . RIGHT/LEFT HEART CATH AND CORONARY ANGIOGRAPHY N/A 08/30/2017   Procedure: RIGHT/LEFT HEART CATH AND CORONARY ANGIOGRAPHY;  Surgeon: Larey Dresser, MD;  Location: [place] [facility];  Service: Cardiovascular;  Laterality: N/A;  . ROTATOR CUFF REPAIR     right  . SIMPLE MASTECTOMY WITH AXILLARY SENTINEL NODE BIOPSY Right 07/02/2017   Procedure: RIGHT TOTAL  MASTECTOMY WITH AXILLARY SENTINEL LYMPH  NODE BIOPSY;  Surgeon: Excell Seltzer, MD;  Location: [place];  Service: General;  Laterality: Right;  . TOTAL KNEE ARTHROPLASTY  08/04/2012   Procedure: TOTAL KNEE ARTHROPLASTY;  Surgeon: Gearlean Alf, MD;  [facility];  Service: Orthopedics;  Laterality: Left;  . TOTAL KNEE ARTHROPLASTY Right 04/11/2015   Procedure: RIGHT TOTAL KNEE ARTHROPLASTY;  Surgeon: Gaynelle Arabian, MD;  [facility];  Service: Orthopedics;  Laterality: Right;  . ulnar nerve surgery Right 1986    There were no vitals filed for this visit.  Subjective Assessment - 01/06/18 1109    Subjective  Feeling pretty good this morning, not too tired, my feet and low back are achey but  nothing out of the ordinary. Reports tripping over the brick steps last Thursday probably from not picking up her trailing foot . This ended up in a fall.     Currently in Pain?  -- Feet, low back: 3/10: pt describes as normal pains.     Aggravating Factors   constant    Pain Relieving Factors  meds    Multiple Pain Sites  No                      [facility] Adult PT Treatment/Exercise - 01/06/18 0001      Knee/Hip Exercises: Aerobic   Nustep  L2 x 9 min PTA present for status update and monitoring       Knee/Hip Exercises: Standing   Hip Flexion  -- Marching type of walking 25 feet 2x, VC to pick feet up more    Rebounder  3 way weight shifting 1 min each    Other Standing Knee Exercises   Walking around the gym continuous no device: 4 min 52 sec. Shortness of breathe reported moderate by pt.       Knee/Hip Exercises: Seated   Long Arc Quad  Strengthening;Both;2 sets;10 reps;Weights    Long Arc Quad Weight  5 lbs.    Ball Squeeze  hold 10 sec 15x               PT Short Term Goals - 12/26/17 1501      PT SHORT TERM GOAL #4   Title  Berg balance >/= 50/56 due to increased strength and improved balance    Time  4    Period  Weeks    Status  Achieved      Additional Short Term Goals   Additional Short Term Goals  Yes        PT Long Term Goals - 12/24/17 1453      PT LONG TERM GOAL #1   Title  independent with HEP and understand how to progress herselt    Time  8    Period  Weeks    Status  On-going            Plan - 01/06/18 1111    Clinical Impression Statement  Pt was able to demonstrates the ability to walk longer duration today:  almost 5 min, with moderate shortness of breath. Pt was monitored for this by PTA. Pt had reported LE fatigue after ambulating for that length of time. Pt does have difficulty lifting her trailing leg with stepping exercises.    Rehab Potential  Excellent    Clinical Impairments Affecting Rehab Potential  s/p right breast cancer 05/27/2017; right masectomy 07/02/2017; fibromyalgia,     PT Frequency  2x / week    PT Duration  8 weeks    PT Treatment/Interventions  Biofeedback;Gait training;Therapeutic activities;Therapeutic exercise;Balance training;Patient/family education;Neuromuscular re-education;Manual techniques    PT Next Visit Plan   balance exercises; nustep 9-10 min, stepping over objects.    Consulted and Agree with Plan of Care  Patient       Patient will benefit from skilled therapeutic intervention in order to improve the following deficits and impairments:  Abnormal gait, Increased fascial restricitons, Decreased mobility, Increased muscle spasms, Decreased strength, Decreased activity tolerance, Decreased  endurance, Decreased balance, Difficulty walking  Visit Diagnosis: Muscle weakness (generalized)  Other abnormalities of gait and mobility  Abnormal posture  Malignant neoplasm of upper-inner quadrant of right breast in female, estrogen receptor positive (HCC)  Chronic  left shoulder pain  Chronic right shoulder pain     Problem List Patient Active Problem List   Diagnosis Date Noted  . Severe obesity ([place]) 12/02/2017  . Anemia, normocytic normochromic 08/10/2017  . Chest pain 08/10/2017  . Essential hypertension 08/10/2017  . CKD (chronic kidney disease), 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  . Type II diabetes mellitus ([place]) 08/10/2017  . Genetic testing 07/10/2017  . Stage I breast cancer, right ([place]) 07/02/2017  . Family history of breast cancer   . Family history of prostate cancer   . Malignant neoplasm of upper-inner quadrant of right breast in female, estrogen receptor positive ([place]) 05/31/2017  . Synovitis of knee 03/16/2014  . Postop Hypokalemia 08/07/2012  . Postop Hyponatremia 08/06/2012  . OA (osteoarthritis) of knee 08/04/2012    COCHRAN,JENNIFER, PTA 01/06/2018, 11:43 AM  [facility] [address], [place], [place], [address] Phone: [phone]   Fax:  [phone]  Name: Kiara Brown MRN: [id number] Date of Birth: 02/18/1941

## 2018-01-08 ENCOUNTER — Ambulatory Visit: Payer: Medicare Other | Admitting: Physical Therapy

## 2018-01-08 ENCOUNTER — Encounter: Payer: Self-pay | Admitting: Physical Therapy

## 2018-01-08 DIAGNOSIS — M25512 Pain in left shoulder: Secondary | ICD-10-CM | POA: Diagnosis not present

## 2018-01-08 DIAGNOSIS — M6281 Muscle weakness (generalized): Secondary | ICD-10-CM

## 2018-01-08 DIAGNOSIS — R293 Abnormal posture: Secondary | ICD-10-CM

## 2018-01-08 DIAGNOSIS — R2689 Other abnormalities of gait and mobility: Secondary | ICD-10-CM | POA: Diagnosis not present

## 2018-01-08 DIAGNOSIS — Z170 Estrogen receptor positive status [ER+]: Secondary | ICD-10-CM | POA: Diagnosis not present

## 2018-01-08 DIAGNOSIS — C50211 Malignant neoplasm of upper-inner quadrant of right female breast: Secondary | ICD-10-CM | POA: Diagnosis not present

## 2018-01-08 NOTE — Telephone Encounter (Signed)
Oral Oncology Patient Advocate Encounter  Received notification from [employer] Patient Assistance program that patient has been successfully re-enrolled into their program to continue to receive Tykerb from the manufacturer at $0 out of pocket until 11/25/2018.   I called and left a voicemail for the patient.  Prescriptions for Tykerb can continue to be faxed to [employer] at [phone].    Gilmore Laroche, CPhT, [place] Patient Advocate [phone] 01/08/2018 10:19 AM

## 2018-01-08 NOTE — Therapy (Addendum)
[facility] [address], [place], [place], [address] Phone: [phone]   Fax:  [phone]  Physical Therapy Treatment  Patient Details  Name: Kiara Brown MRN: [id number] Date of Birth: [birth year] Referring Provider: Dr. Lurline Del   Encounter Date: 01/08/2018  PT End of Session - 01/08/18 1133    Visit Number  10    Date for PT Re-Evaluation  01/30/18    Authorization Type  Medicare BCBS    PT Start Time  1100    PT Stop Time  1142    PT Time Calculation (min)  42 min    Activity Tolerance  Patient tolerated treatment well    Behavior During Therapy  [facility] for tasks assessed/performed       Past Medical History:  Diagnosis Date  . Arthritis    osteo, rheumatoid  . Breast cancer ([place])   . Diverticulosis   . Dysrhythmia    hx rapid heartbeat, no cardiologist  . Family history of breast cancer   . Family history of prostate cancer   . Fibromyalgia    peripheral neuropathy  . Fibromyalgia   . GERD (gastroesophageal reflux disease)   . H/O hiatal hernia   . Hyperlipidemia   . PONV (postoperative nausea and vomiting)    "THROAT WITH EXTREME [id number]"8916, ulnar nerve surgery  . Sciatica   . Shortness of breath dyspnea    with exertion  . UTI (lower urinary tract infection)     Past Surgical History:  Procedure Laterality Date  . ABDOMINAL HYSTERECTOMY    . APPENDECTOMY    . back injection      July 2018- x1  . BREAST SURGERY Left 1986   biopsy, Recent 05/2017 at [facility]- had biopsy on R breast- malignant   . CARDIAC CATHETERIZATION  2009   "no blockage per pt"  . CHOLECYSTECTOMY    . CYSTOSCOPY WITH BIOPSY    . INCONTINENCE SURGERY    . JOINT REPLACEMENT    . KNEE ARTHROSCOPY     bilateral  . KNEE ARTHROSCOPY Left 03/17/2014   Procedure: ARTHROSCOPY LEFT KNEE WITH SYNOVECTOMY;  Surgeon: Gearlean Alf, MD;  [facility];  Service: Orthopedics;  Laterality: Left;  . PORTACATH  PLACEMENT Left 07/02/2017   Procedure: INSERTION PORT-A-CATH;  Surgeon: Excell Seltzer, MD;  Location: [place];  Service: General;  Laterality: Left;  . RIGHT/LEFT HEART CATH AND CORONARY ANGIOGRAPHY N/A 08/30/2017   Procedure: RIGHT/LEFT HEART CATH AND CORONARY ANGIOGRAPHY;  Surgeon: Larey Dresser, MD;  Location: [place] [facility];  Service: Cardiovascular;  Laterality: N/A;  . ROTATOR CUFF REPAIR     right  . SIMPLE MASTECTOMY WITH AXILLARY SENTINEL NODE BIOPSY Right 07/02/2017   Procedure: RIGHT TOTAL  MASTECTOMY WITH AXILLARY SENTINEL LYMPH  NODE BIOPSY;  Surgeon: Excell Seltzer, MD;  Location: [place];  Service: General;  Laterality: Right;  . TOTAL KNEE ARTHROPLASTY  08/04/2012   Procedure: TOTAL KNEE ARTHROPLASTY;  Surgeon: Gearlean Alf, MD;  [facility];  Service: Orthopedics;  Laterality: Left;  . TOTAL KNEE ARTHROPLASTY Right 04/11/2015   Procedure: RIGHT TOTAL KNEE ARTHROPLASTY;  Surgeon: Gaynelle Arabian, MD;  [facility];  Service: Orthopedics;  Laterality: Right;  . ulnar nerve surgery Right 1986    There were no vitals filed for this visit.  Subjective Assessment - 01/08/18 1122    Subjective  I fell last Thursday going up steps on brick patio and fell onto the right side  into the grass. I feel I did not pick up my left leg all the way. I have trouble with balancing on one leg.     Patient Stated Goals  increase balance    Currently in Pain?  No/denies                      [facility] Adult PT Treatment/Exercise - 01/08/18 0001      Knee/Hip Exercises: Aerobic   Nustep  L2 x 10 min PTA present for status update and monitoring       Knee/Hip Exercises: Standing   Lateral Step Up  Right;Left;1 set;10 reps monitoring for technique and not jolt her back    Forward Step Up  Left;Right;10 reps    Other Standing Knee Exercises  play catch with ball with feet together, tandem stance both ways with c.g.     Other Standing Knee Exercises  walk through ladder  and pick up feet with c.g for 4 times; and sideways      Knee/Hip Exercises: Seated   Long Arc Quad  Strengthening;Both;10 reps;Weights;3 sets    [employer] Weight  5 lbs.    Ball Squeeze  hold 10 sec 15x VC to breath    Sit to Sand  10 reps;without UE support holding red plyoball               PT Short Term Goals - 12/26/17 1501      PT SHORT TERM GOAL #4   Title  Berg balance >/= 50/56 due to increased strength and improved balance    Time  4    Period  Weeks    Status  Achieved      Additional Short Term Goals   Additional Short Term Goals  Yes        PT Long Term Goals - 01/08/18 1140      PT LONG TERM GOAL #1   Title  independent with HEP and understand how to progress herselt    Baseline  still learning    Time  8    Period  Weeks    Status  On-going      PT LONG TERM GOAL #2   Title  Berg balance score is </= 52/56 due to increased strength and balance and not having to hold onto walls    Time  8    Period  Weeks    Status  On-going      PT LONG TERM GOAL #3   Title  vaginal dryness has improved and patient is able to tell a difference due to using moisturizers to the vaginal area    Time  8    Period  Weeks    Status  On-going      PT LONG TERM GOAL #4   Title  able to stand on one leg to put her pants on or go up a step due to improved balance and strength    Time  8    Period  Weeks    Status  On-going      PT LONG TERM GOAL #5   Title  walk for 6 minutes with Shortness of breath decreased >/= 50% due to improved endurance    Time  8    Period  Weeks    Status  On-going            Plan - 01/08/18 1134    Clinical Impression Statement  Patient reported she fell  last Thursday going up two steps due to her left foot not lifting up high enough.  Patient did not hurt herself.  Patient had difficulty wiht one legged stance. Patient was short of breath while doing the floor ladders.   Patient nees verbal cues to lift her left leg with the  stairs and floor ladder.  Patient will benefit from skilled therapy to improve balance and strength to reduce future risk of falls.     Rehab Potential  Excellent    Clinical Impairments Affecting Rehab Potential  s/p right breast cancer 05/27/2017; right masectomy 07/02/2017; fibromyalgia,     PT Frequency  2x / week    PT Duration  8 weeks    PT Treatment/Interventions  Biofeedback;Gait training;Therapeutic activities;Therapeutic exercise;Balance training;Patient/family education;Neuromuscular re-education;Manual techniques    PT Next Visit Plan   balance exercises; nustep 9-10 min, stepping over objects.    PT Home Exercise Plan  progress as needed    Consulted and Agree with Plan of Care  Patient       Patient will benefit from skilled therapeutic intervention in order to improve the following deficits and impairments:  Abnormal gait, Increased fascial restricitons, Decreased mobility, Increased muscle spasms, Decreased strength, Decreased activity tolerance, Decreased endurance, Decreased balance, Difficulty walking  Visit Diagnosis: Muscle weakness (generalized)  Other abnormalities of gait and mobility  Abnormal posture     Problem List Patient Active Problem List   Diagnosis Date Noted  . Severe obesity ([place]) 12/02/2017  . Anemia, normocytic normochromic 08/10/2017  . Chest pain 08/10/2017  . Essential hypertension 08/10/2017  . CKD (chronic kidney disease), 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  . Type II diabetes mellitus ([place]) 08/10/2017  . Genetic testing 07/10/2017  . Stage I breast cancer, right ([place]) 07/02/2017  . Family history of breast cancer   . Family history of prostate cancer   . Malignant neoplasm of upper-inner quadrant of right breast in female, estrogen receptor positive ([place]) 05/31/2017  . Synovitis of knee 03/16/2014  . Postop Hypokalemia 08/07/2012  . Postop Hyponatremia 08/06/2012  . OA (osteoarthritis) of knee 08/04/2012    Earlie Counts, PT 01/08/18 11:44  AM   [facility] [address], [place], [place], [address] Phone: [phone]   Fax:  [phone]  Name: Kiara Brown MRN: [id number] Date of Birth: [birth year] PHYSICAL THERAPY DISCHARGE SUMMARY  Visits from Start of Care: 10  Current functional level related to goals / functional outcomes: See above.  Patient was admitted to the hospital due to an infection.    Remaining deficits: See above.    Education / Equipment: HEP Plan: Patient agrees to discharge.  Patient goals were not met. Patient is being discharged due to a change in medical status.  Thank you for the referral. Earlie Counts, PT 01/29/18 9:34 AM  ?????

## 2018-01-13 ENCOUNTER — Inpatient Hospital Stay (HOSPITAL_BASED_OUTPATIENT_CLINIC_OR_DEPARTMENT_OTHER): Payer: Medicare Other | Admitting: Oncology

## 2018-01-13 ENCOUNTER — Telehealth: Payer: Self-pay | Admitting: Oncology

## 2018-01-13 ENCOUNTER — Inpatient Hospital Stay: Payer: Medicare Other | Attending: Oncology

## 2018-01-13 VITALS — BP 145/78 | HR 74 | Temp 97.8°F | Resp 20 | Ht 68.0 in | Wt 253.1 lb

## 2018-01-13 DIAGNOSIS — G629 Polyneuropathy, unspecified: Secondary | ICD-10-CM

## 2018-01-13 DIAGNOSIS — M797 Fibromyalgia: Secondary | ICD-10-CM | POA: Diagnosis not present

## 2018-01-13 DIAGNOSIS — E785 Hyperlipidemia, unspecified: Secondary | ICD-10-CM

## 2018-01-13 DIAGNOSIS — R6883 Chills (without fever): Secondary | ICD-10-CM | POA: Diagnosis not present

## 2018-01-13 DIAGNOSIS — C50211 Malignant neoplasm of upper-inner quadrant of right female breast: Secondary | ICD-10-CM | POA: Insufficient documentation

## 2018-01-13 DIAGNOSIS — Z801 Family history of malignant neoplasm of trachea, bronchus and lung: Secondary | ICD-10-CM | POA: Insufficient documentation

## 2018-01-13 DIAGNOSIS — C50911 Malignant neoplasm of unspecified site of right female breast: Secondary | ICD-10-CM

## 2018-01-13 DIAGNOSIS — Z9181 History of falling: Secondary | ICD-10-CM

## 2018-01-13 DIAGNOSIS — Z8042 Family history of malignant neoplasm of prostate: Secondary | ICD-10-CM | POA: Diagnosis not present

## 2018-01-13 DIAGNOSIS — Z90710 Acquired absence of both cervix and uterus: Secondary | ICD-10-CM

## 2018-01-13 DIAGNOSIS — Z79899 Other long term (current) drug therapy: Secondary | ICD-10-CM | POA: Insufficient documentation

## 2018-01-13 DIAGNOSIS — Z7982 Long term (current) use of aspirin: Secondary | ICD-10-CM | POA: Insufficient documentation

## 2018-01-13 DIAGNOSIS — Z170 Estrogen receptor positive status [ER+]: Secondary | ICD-10-CM | POA: Diagnosis not present

## 2018-01-13 DIAGNOSIS — R590 Localized enlarged lymph nodes: Secondary | ICD-10-CM | POA: Diagnosis not present

## 2018-01-13 DIAGNOSIS — M542 Cervicalgia: Secondary | ICD-10-CM | POA: Insufficient documentation

## 2018-01-13 DIAGNOSIS — Z90722 Acquired absence of ovaries, bilateral: Secondary | ICD-10-CM

## 2018-01-13 DIAGNOSIS — Z803 Family history of malignant neoplasm of breast: Secondary | ICD-10-CM

## 2018-01-13 DIAGNOSIS — R2231 Localized swelling, mass and lump, right upper limb: Secondary | ICD-10-CM | POA: Diagnosis not present

## 2018-01-13 DIAGNOSIS — K219 Gastro-esophageal reflux disease without esophagitis: Secondary | ICD-10-CM

## 2018-01-13 DIAGNOSIS — Z9011 Acquired absence of right breast and nipple: Secondary | ICD-10-CM

## 2018-01-13 DIAGNOSIS — M79601 Pain in right arm: Secondary | ICD-10-CM | POA: Diagnosis not present

## 2018-01-13 DIAGNOSIS — G4700 Insomnia, unspecified: Secondary | ICD-10-CM | POA: Diagnosis not present

## 2018-01-13 DIAGNOSIS — R51 Headache: Secondary | ICD-10-CM | POA: Insufficient documentation

## 2018-01-13 DIAGNOSIS — M1990 Unspecified osteoarthritis, unspecified site: Secondary | ICD-10-CM | POA: Insufficient documentation

## 2018-01-13 DIAGNOSIS — Z807 Family history of other malignant neoplasms of lymphoid, hematopoietic and related tissues: Secondary | ICD-10-CM

## 2018-01-13 DIAGNOSIS — Z79811 Long term (current) use of aromatase inhibitors: Secondary | ICD-10-CM | POA: Insufficient documentation

## 2018-01-13 LAB — CBC WITH DIFFERENTIAL/PLATELET
Basophils Absolute: 0.1 10*3/uL (ref 0.0–0.1)
Basophils Relative: 1 %
EOS PCT: 2 %
Eosinophils Absolute: 0.1 10*3/uL (ref 0.0–0.5)
HCT: 32.2 % — ABNORMAL LOW (ref 34.8–46.6)
Hemoglobin: 10.2 g/dL — ABNORMAL LOW (ref 11.6–15.9)
LYMPHS ABS: 1.9 10*3/uL (ref 0.9–3.3)
LYMPHS PCT: 28 %
MCH: 25.6 pg (ref 25.1–34.0)
MCHC: 31.7 g/dL (ref 31.5–36.0)
MCV: 80.6 fL (ref 79.5–101.0)
Monocytes Absolute: 0.6 10*3/uL (ref 0.1–0.9)
Monocytes Relative: 9 %
NEUTROS PCT: 60 %
Neutro Abs: 3.9 10*3/uL (ref 1.5–6.5)
PLATELETS: 357 10*3/uL (ref 145–400)
RBC: 3.99 MIL/uL (ref 3.70–5.45)
RDW: 16.4 % — ABNORMAL HIGH (ref 11.2–14.5)
WBC: 6.5 10*3/uL (ref 3.9–10.3)

## 2018-01-13 LAB — COMPREHENSIVE METABOLIC PANEL
ALK PHOS: 110 U/L (ref 40–150)
ALT: 11 U/L (ref 0–55)
AST: 15 U/L (ref 5–34)
Albumin: 3.4 g/dL — ABNORMAL LOW (ref 3.5–5.0)
Anion gap: 12 — ABNORMAL HIGH (ref 3–11)
BUN: 15 mg/dL (ref 7–26)
CALCIUM: 9.4 mg/dL (ref 8.4–10.4)
CHLORIDE: 101 mmol/L (ref 98–109)
CO2: 24 mmol/L (ref 22–29)
CREATININE: 1.21 mg/dL — AB (ref 0.60–1.10)
GFR calc Af Amer: 49 mL/min — ABNORMAL LOW (ref >60)
GFR, EST NON AFRICAN AMERICAN: 42 mL/min — AB (ref >60)
Glucose, Bld: 160 mg/dL — ABNORMAL HIGH (ref 70–140)
Potassium: 3.6 mmol/L (ref 3.5–5.1)
SODIUM: 137 mmol/L (ref 136–145)
Total Bilirubin: 0.5 mg/dL (ref 0.2–1.2)
Total Protein: 7.2 g/dL (ref 6.4–8.3)

## 2018-01-13 NOTE — Telephone Encounter (Signed)
Gave patient AVs and calendar of upcoming May appointments.  °

## 2018-01-17 ENCOUNTER — Inpatient Hospital Stay (HOSPITAL_BASED_OUTPATIENT_CLINIC_OR_DEPARTMENT_OTHER): Payer: Medicare Other | Admitting: Medical

## 2018-01-17 ENCOUNTER — Telehealth: Payer: Self-pay | Admitting: Medical Oncology

## 2018-01-17 VITALS — BP 139/55 | HR 72 | Temp 98.2°F | Resp 19 | Ht 68.0 in | Wt 251.1 lb

## 2018-01-17 DIAGNOSIS — Z79899 Other long term (current) drug therapy: Secondary | ICD-10-CM

## 2018-01-17 DIAGNOSIS — M1990 Unspecified osteoarthritis, unspecified site: Secondary | ICD-10-CM

## 2018-01-17 DIAGNOSIS — R51 Headache: Secondary | ICD-10-CM | POA: Diagnosis not present

## 2018-01-17 DIAGNOSIS — Z9181 History of falling: Secondary | ICD-10-CM

## 2018-01-17 DIAGNOSIS — H1031 Unspecified acute conjunctivitis, right eye: Secondary | ICD-10-CM | POA: Diagnosis not present

## 2018-01-17 DIAGNOSIS — M79601 Pain in right arm: Secondary | ICD-10-CM | POA: Diagnosis not present

## 2018-01-17 DIAGNOSIS — Z170 Estrogen receptor positive status [ER+]: Secondary | ICD-10-CM | POA: Diagnosis not present

## 2018-01-17 DIAGNOSIS — Z8042 Family history of malignant neoplasm of prostate: Secondary | ICD-10-CM

## 2018-01-17 DIAGNOSIS — R590 Localized enlarged lymph nodes: Secondary | ICD-10-CM

## 2018-01-17 DIAGNOSIS — Z807 Family history of other malignant neoplasms of lymphoid, hematopoietic and related tissues: Secondary | ICD-10-CM

## 2018-01-17 DIAGNOSIS — C50211 Malignant neoplasm of upper-inner quadrant of right female breast: Secondary | ICD-10-CM

## 2018-01-17 DIAGNOSIS — Z803 Family history of malignant neoplasm of breast: Secondary | ICD-10-CM

## 2018-01-17 DIAGNOSIS — R2231 Localized swelling, mass and lump, right upper limb: Secondary | ICD-10-CM

## 2018-01-17 DIAGNOSIS — Z9011 Acquired absence of right breast and nipple: Secondary | ICD-10-CM

## 2018-01-17 DIAGNOSIS — K219 Gastro-esophageal reflux disease without esophagitis: Secondary | ICD-10-CM

## 2018-01-17 DIAGNOSIS — G4700 Insomnia, unspecified: Secondary | ICD-10-CM

## 2018-01-17 DIAGNOSIS — R6883 Chills (without fever): Secondary | ICD-10-CM

## 2018-01-17 DIAGNOSIS — G629 Polyneuropathy, unspecified: Secondary | ICD-10-CM | POA: Diagnosis not present

## 2018-01-17 DIAGNOSIS — Z801 Family history of malignant neoplasm of trachea, bronchus and lung: Secondary | ICD-10-CM

## 2018-01-17 DIAGNOSIS — E785 Hyperlipidemia, unspecified: Secondary | ICD-10-CM

## 2018-01-17 DIAGNOSIS — M542 Cervicalgia: Secondary | ICD-10-CM | POA: Diagnosis not present

## 2018-01-17 DIAGNOSIS — Z79811 Long term (current) use of aromatase inhibitors: Secondary | ICD-10-CM

## 2018-01-17 DIAGNOSIS — Z90710 Acquired absence of both cervix and uterus: Secondary | ICD-10-CM

## 2018-01-17 DIAGNOSIS — M797 Fibromyalgia: Secondary | ICD-10-CM

## 2018-01-17 DIAGNOSIS — M79621 Pain in right upper arm: Secondary | ICD-10-CM

## 2018-01-17 DIAGNOSIS — M7989 Other specified soft tissue disorders: Secondary | ICD-10-CM

## 2018-01-17 DIAGNOSIS — Z7982 Long term (current) use of aspirin: Secondary | ICD-10-CM

## 2018-01-17 MED ORDER — SULFAMETHOXAZOLE-TRIMETHOPRIM 800-160 MG PO TABS
1.0000 | ORAL_TABLET | Freq: Two times a day (BID) | ORAL | 0 refills | Status: DC
Start: 2018-01-17 — End: 2018-01-30

## 2018-01-17 NOTE — Progress Notes (Signed)
Symptoms Management Clinic Progress Note   Kiara Brown [id number] 07-01-1941 77 [address].o. Dr. Elio Forget Kiara Brown is managed by Dr. Jana Hakim  Actively treated with chemotherapy: no  Current Therapy: Anastrozole and lapatinib  Last Treated: 01/17/2018  Assessment: Plan:    Axillary pain, right - Plan: [place] AXILLA RIGHT  Right axillary swelling - Plan: sulfamethoxazole-trimethoprim (BACTRIM DS,SEPTRA DS) 800-160 MG tablet   Right axillary pain with swelling: Kiara Brown was referred for an ultrasound of her right axilla.  Additionally she was given a prescription for Bactrim DS p.o. twice daily times 14 days.  Please see After Visit Summary for patient specific instructions.  Future Appointments  Date Time Provider [place]  01/21/2018 11:30 AM [facility] ECHO 2 [id number]  04/02/2018  2:30 PM [facility] LAB 6 [facility] None  04/02/2018  3:00 PM Magrinat, Virgie Dad, MD [facility] None    Orders Placed This Encounter  Procedures  . [place] AXILLA RIGHT       Subjective:   Patient ID:  Kiara Brown is a 77 y.o. (DOB November 11, 1941) female.  Chief Complaint:  Chief Complaint  Patient presents with  . 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  She is currently treated with anastrozole and lapatinib.  She was last seen by Dr. Jana Hakim at on 01/13/2018.  She reported having some pain in her right arm at that time.  Dr. Jana Hakim suggested that she increase her gabapentin to 200 mg each night.  Since doing that she has had a headache, neck pain, chills and continued pain in her right axilla.  She has had no changes in activity.  And has had no upper respiratory symptoms.  She denies a cough.  Medications: I have reviewed the patient's current medications.  Allergies:  Allergies  Allergen  Reactions  . Duloxetine Other (See Comments)    UNSPECIFIED REACTION   . Nsaids Rash  . Oseltamivir Diarrhea and Other (See Comments)    Abdominal pain     Past Medical History:  Diagnosis Date  . Arthritis    osteo, rheumatoid  . Breast cancer ([place])   . Diverticulosis   . Dysrhythmia    hx rapid heartbeat, no cardiologist  . Family history of breast cancer   . Family history of prostate cancer   . Fibromyalgia    peripheral neuropathy  . Fibromyalgia   . GERD (gastroesophageal reflux disease)   . H/O hiatal hernia   . Hyperlipidemia   . PONV (postoperative nausea and vomiting)    "THROAT WITH EXTREME [id number]"7341, ulnar nerve surgery  . Sciatica   . Shortness of breath dyspnea    with exertion  . UTI (lower urinary tract infection)     Past Surgical History:  Procedure Laterality Date  . ABDOMINAL HYSTERECTOMY    . APPENDECTOMY    . back injection      July 2018- x1  . BREAST SURGERY Left 1986   biopsy, Recent 05/2017 at [facility]- had biopsy on R breast- malignant   . CARDIAC CATHETERIZATION  2009   "no blockage per pt"  . CHOLECYSTECTOMY    . CYSTOSCOPY WITH BIOPSY    . INCONTINENCE SURGERY    . JOINT REPLACEMENT    . KNEE ARTHROSCOPY     bilateral  .  KNEE ARTHROSCOPY Left 03/17/2014   Procedure: ARTHROSCOPY LEFT KNEE WITH SYNOVECTOMY;  Surgeon: Gearlean Alf, MD;  [facility];  Service: Orthopedics;  Laterality: Left;  . PORTACATH PLACEMENT Left 07/02/2017   Procedure: INSERTION PORT-A-CATH;  Surgeon: Excell Seltzer, MD;  Location: [place];  Service: General;  Laterality: Left;  . RIGHT/LEFT HEART CATH AND CORONARY ANGIOGRAPHY N/A 08/30/2017   Procedure: RIGHT/LEFT HEART CATH AND CORONARY ANGIOGRAPHY;  Surgeon: Larey Dresser, MD;  Location: [place] [facility];  Service: Cardiovascular;  Laterality: N/A;  . ROTATOR CUFF REPAIR     right  . SIMPLE MASTECTOMY WITH AXILLARY SENTINEL NODE BIOPSY Right 07/02/2017   Procedure: RIGHT TOTAL  MASTECTOMY WITH  AXILLARY SENTINEL LYMPH  NODE BIOPSY;  Surgeon: Excell Seltzer, MD;  Location: [place];  Service: General;  Laterality: Right;  . TOTAL KNEE ARTHROPLASTY  08/04/2012   Procedure: TOTAL KNEE ARTHROPLASTY;  Surgeon: Gearlean Alf, MD;  [facility];  Service: Orthopedics;  Laterality: Left;  . TOTAL KNEE ARTHROPLASTY Right 04/11/2015   Procedure: RIGHT TOTAL KNEE ARTHROPLASTY;  Surgeon: Gaynelle Arabian, MD;  [facility];  Service: Orthopedics;  Laterality: Right;  . ulnar nerve surgery Right 1986    Family History  Problem Relation Age of Onset  . Hypertension Mother   . Stroke Mother   . Breast cancer Mother 105       died at 15  . Hypertension Father   . Heart attack Father        died at 26  . Hypertension Brother        MI  . Prostate cancer Brother 15  . Hypertension Brother        MI  . Lung cancer Brother        possible lung cancer, spot found on lung being monitored  . Hypertension Brother        MI  . Non-Hodgkin's lymphoma Sister 3  . Breast cancer Other 62       is currently 90  . Breast cancer Other 55  . Cancer Cousin 62       type of cancer unk.   . Cancer Other        type unknown, eye/face   . Cancer Other 25       died from cancer in his 46's, type unknown    Social History   Socioeconomic History  . Marital status: Married    Spouse name: Not on file  . Number of children: Not on file  . Years of education: Not on file  . Highest education level: Not on file  Social Needs  . Financial resource strain: Not on file  . Food insecurity - worry: Not on file  . Food insecurity - inability: Not on file  . Transportation needs - medical: Not on file  . Transportation needs - non-medical: Not on file  Occupational History  . Not on file  Tobacco Use  . Smoking status: Never Smoker  . Smokeless tobacco: Never Used  Substance and Sexual Activity  . Alcohol use: No  . Drug use: No  . Sexual activity: Not on file  Other Topics Concern  . Not  on file  Social History Narrative  . Not on file    Past Medical History, Surgical history, Social history, and Family history were reviewed and updated as appropriate.   Please see review of systems for further details on the patient's review from today.   Review of Systems:  Review of Systems  Constitutional: Positive for chills. Negative for diaphoresis and fever.  HENT: Negative for congestion.   Respiratory: Negative for cough, chest tightness, shortness of breath and wheezing.   Cardiovascular: Negative for chest pain.  Musculoskeletal: Positive for neck pain.  Skin:       Right axillary pain and swelling.  Neurological: Positive for headaches.    Objective:   Physical Exam:  BP (!) 139/55 (BP Location: Left Arm, Patient Position: Sitting)   Pulse 72   Temp 98.2 F (36.8 C) (Oral)   Resp 19   Ht '5\' 8"'$  (1.727 m)   Wt 251 lb 1.6 oz (113.9 kg)   SpO2 99%   BMI 38.18 kg/m  ECOG: 0  Physical Exam  Constitutional: No distress.  HENT:  Head: Normocephalic and atraumatic.  Neck: Normal range of motion. Neck supple.  Cardiovascular: Normal rate, regular rhythm and normal heart sounds. Exam reveals no gallop and no friction rub.  No murmur heard. Pulmonary/Chest: Effort normal and breath sounds normal. No respiratory distress. She has no wheezes. She has no rales.    Lymphadenopathy:    She has no cervical adenopathy.  Neurological: She is alert. Coordination normal.  Skin: Skin is warm and dry. No rash noted. She is not diaphoretic. No erythema.    Lab Review:     Component Value Date/Time   NA 137 01/13/2018 1221   NA 131 (L) 09/06/2017 1018   K 3.6 01/13/2018 1221   K 4.2 09/06/2017 1018   CL 101 01/13/2018 1221   CO2 24 01/13/2018 1221   CO2 25 09/06/2017 1018   GLUCOSE 160 (H) 01/13/2018 1221   GLUCOSE 187 (H) 09/06/2017 1018   BUN 15 01/13/2018 1221   BUN 11.2 09/06/2017 1018   CREATININE 1.21 (H) 01/13/2018 1221   CREATININE 1.2 (H) 09/06/2017  1018   CALCIUM 9.4 01/13/2018 1221   CALCIUM 9.3 09/06/2017 1018   PROT 7.2 01/13/2018 1221   PROT 6.9 09/06/2017 1018   ALBUMIN 3.4 (L) 01/13/2018 1221   ALBUMIN 3.4 (L) 09/06/2017 1018   AST 15 01/13/2018 1221   AST 20 09/06/2017 1018   ALT 11 01/13/2018 1221   ALT 14 09/06/2017 1018   ALKPHOS 110 01/13/2018 1221   ALKPHOS 103 09/06/2017 1018   BILITOT 0.5 01/13/2018 1221   BILITOT 0.57 09/06/2017 1018   GFRNONAA 42 (L) 01/13/2018 1221   GFRAA 49 (L) 01/13/2018 1221       Component Value Date/Time   WBC 6.5 01/13/2018 1221   RBC 3.99 01/13/2018 1221   HGB 10.2 (L) 01/13/2018 1221   HGB 10.1 (L) 09/06/2017 1018   HCT 32.2 (L) 01/13/2018 1221   HCT 30.9 (L) 09/06/2017 1018   PLT 357 01/13/2018 1221   PLT 328 09/06/2017 1018   MCV 80.6 01/13/2018 1221   MCV 81.5 09/06/2017 1018   MCH 25.6 01/13/2018 1221   MCHC 31.7 01/13/2018 1221   RDW 16.4 (H) 01/13/2018 1221   RDW 16.6 (H) 09/06/2017 1018   LYMPHSABS 1.9 01/13/2018 1221   LYMPHSABS 1.8 09/06/2017 1018   MONOABS 0.6 01/13/2018 1221   MONOABS 0.6 09/06/2017 1018   EOSABS 0.1 01/13/2018 1221   EOSABS 0.1 09/06/2017 1018   BASOSABS 0.1 01/13/2018 1221   BASOSABS 0.1 09/06/2017 1018   -------------------------------  Imaging from last 24 hours (if applicable):  Radiology interpretation: No results found.

## 2018-01-17 NOTE — Telephone Encounter (Signed)
Chills, headache , pain under left axilla since Monday .  Took increase dose of gabapentin monday night 200 mg ( per Magrinat's instruction). On arimidex and Tykerb.Symptoms somewhat better today , except pain, temp 99. Appt today with [facility].

## 2018-01-20 DIAGNOSIS — M79621 Pain in right upper arm: Secondary | ICD-10-CM | POA: Diagnosis not present

## 2018-01-20 DIAGNOSIS — R2231 Localized swelling, mass and lump, right upper limb: Secondary | ICD-10-CM | POA: Diagnosis not present

## 2018-01-21 ENCOUNTER — Other Ambulatory Visit: Payer: Self-pay

## 2018-01-21 ENCOUNTER — Ambulatory Visit (HOSPITAL_COMMUNITY): Payer: Medicare Other | Attending: Oncology

## 2018-01-21 DIAGNOSIS — I340 Nonrheumatic mitral (valve) insufficiency: Secondary | ICD-10-CM | POA: Insufficient documentation

## 2018-01-21 DIAGNOSIS — N611 Abscess of the breast and nipple: Secondary | ICD-10-CM | POA: Diagnosis not present

## 2018-01-21 DIAGNOSIS — Z170 Estrogen receptor positive status [ER+]: Secondary | ICD-10-CM | POA: Diagnosis not present

## 2018-01-21 DIAGNOSIS — C50911 Malignant neoplasm of unspecified site of right female breast: Secondary | ICD-10-CM | POA: Diagnosis not present

## 2018-01-21 DIAGNOSIS — N63 Unspecified lump in unspecified breast: Secondary | ICD-10-CM | POA: Diagnosis not present

## 2018-01-21 DIAGNOSIS — C50211 Malignant neoplasm of upper-inner quadrant of right female breast: Secondary | ICD-10-CM

## 2018-01-27 ENCOUNTER — Other Ambulatory Visit: Payer: Self-pay | Admitting: Student

## 2018-01-27 ENCOUNTER — Inpatient Hospital Stay: Payer: Medicare Other | Attending: Oncology | Admitting: Medical

## 2018-01-27 ENCOUNTER — Encounter (HOSPITAL_COMMUNITY): Payer: Self-pay | Admitting: *Deleted

## 2018-01-27 ENCOUNTER — Telehealth: Payer: Self-pay

## 2018-01-27 ENCOUNTER — Other Ambulatory Visit: Payer: Self-pay

## 2018-01-27 ENCOUNTER — Inpatient Hospital Stay (HOSPITAL_COMMUNITY)
Admission: AD | Admit: 2018-01-27 | Discharge: 2018-01-30 | DRG: 857 | Disposition: A | Discharge status: Home Health | Payer: Medicare Other | Source: Ambulatory Visit | Attending: General Surgery | Admitting: General Surgery

## 2018-01-27 VITALS — BP 135/66 | HR 80 | Temp 98.5°F | Resp 20 | Ht 68.0 in | Wt 249.0 lb

## 2018-01-27 DIAGNOSIS — E785 Hyperlipidemia, unspecified: Secondary | ICD-10-CM | POA: Diagnosis not present

## 2018-01-27 DIAGNOSIS — S300XXA Contusion of lower back and pelvis, initial encounter: Secondary | ICD-10-CM | POA: Insufficient documentation

## 2018-01-27 DIAGNOSIS — Z803 Family history of malignant neoplasm of breast: Secondary | ICD-10-CM | POA: Insufficient documentation

## 2018-01-27 DIAGNOSIS — Z79899 Other long term (current) drug therapy: Secondary | ICD-10-CM | POA: Diagnosis not present

## 2018-01-27 DIAGNOSIS — R0781 Pleurodynia: Secondary | ICD-10-CM | POA: Insufficient documentation

## 2018-01-27 DIAGNOSIS — L02411 Cutaneous abscess of right axilla: Secondary | ICD-10-CM | POA: Diagnosis not present

## 2018-01-27 DIAGNOSIS — R509 Fever, unspecified: Secondary | ICD-10-CM | POA: Diagnosis not present

## 2018-01-27 DIAGNOSIS — M1990 Unspecified osteoarthritis, unspecified site: Secondary | ICD-10-CM | POA: Insufficient documentation

## 2018-01-27 DIAGNOSIS — K219 Gastro-esophageal reflux disease without esophagitis: Secondary | ICD-10-CM | POA: Diagnosis present

## 2018-01-27 DIAGNOSIS — Y92008 Other place in unspecified non-institutional (private) residence as the place of occurrence of the external cause: Secondary | ICD-10-CM | POA: Insufficient documentation

## 2018-01-27 DIAGNOSIS — W1809XA Striking against other object with subsequent fall, initial encounter: Secondary | ICD-10-CM | POA: Insufficient documentation

## 2018-01-27 DIAGNOSIS — C50211 Malignant neoplasm of upper-inner quadrant of right female breast: Secondary | ICD-10-CM

## 2018-01-27 DIAGNOSIS — Z79811 Long term (current) use of aromatase inhibitors: Secondary | ICD-10-CM

## 2018-01-27 DIAGNOSIS — L0291 Cutaneous abscess, unspecified: Secondary | ICD-10-CM | POA: Diagnosis present

## 2018-01-27 DIAGNOSIS — Z9011 Acquired absence of right breast and nipple: Secondary | ICD-10-CM

## 2018-01-27 DIAGNOSIS — M797 Fibromyalgia: Secondary | ICD-10-CM | POA: Diagnosis not present

## 2018-01-27 DIAGNOSIS — Z888 Allergy status to other drugs, medicaments and biological substances status: Secondary | ICD-10-CM

## 2018-01-27 DIAGNOSIS — T8141XA Infection following a procedure, superficial incisional surgical site, initial encounter: Principal | ICD-10-CM | POA: Diagnosis present

## 2018-01-27 DIAGNOSIS — Z9221 Personal history of antineoplastic chemotherapy: Secondary | ICD-10-CM

## 2018-01-27 DIAGNOSIS — Z853 Personal history of malignant neoplasm of breast: Secondary | ICD-10-CM

## 2018-01-27 DIAGNOSIS — Z6841 Body Mass Index (BMI) 40.0 and over, adult: Secondary | ICD-10-CM

## 2018-01-27 DIAGNOSIS — M069 Rheumatoid arthritis, unspecified: Secondary | ICD-10-CM | POA: Diagnosis present

## 2018-01-27 DIAGNOSIS — Y848 Other medical procedures as the cause of abnormal reaction of the patient, or of later complication, without mention of misadventure at the time of the procedure: Secondary | ICD-10-CM | POA: Diagnosis present

## 2018-01-27 DIAGNOSIS — Z170 Estrogen receptor positive status [ER+]: Secondary | ICD-10-CM | POA: Insufficient documentation

## 2018-01-27 DIAGNOSIS — L02213 Cutaneous abscess of chest wall: Secondary | ICD-10-CM | POA: Diagnosis present

## 2018-01-27 DIAGNOSIS — Z8042 Family history of malignant neoplasm of prostate: Secondary | ICD-10-CM | POA: Insufficient documentation

## 2018-01-27 DIAGNOSIS — I10 Essential (primary) hypertension: Secondary | ICD-10-CM | POA: Diagnosis present

## 2018-01-27 LAB — CBC WITH DIFFERENTIAL/PLATELET
Basophils Absolute: 0 10*3/uL (ref 0.0–0.1)
Basophils Relative: 0 %
EOS PCT: 0 %
Eosinophils Absolute: 0.1 10*3/uL (ref 0.0–0.7)
HEMATOCRIT: 30.8 % — AB (ref 36.0–46.0)
HEMOGLOBIN: 9.8 g/dL — AB (ref 12.0–15.0)
LYMPHS ABS: 2.1 10*3/uL (ref 0.7–4.0)
LYMPHS PCT: 15 %
MCH: 25.3 pg — ABNORMAL LOW (ref 26.0–34.0)
MCHC: 31.8 g/dL (ref 30.0–36.0)
MCV: 79.6 fL (ref 78.0–100.0)
Monocytes Absolute: 1.3 10*3/uL — ABNORMAL HIGH (ref 0.1–1.0)
Monocytes Relative: 9 %
NEUTROS ABS: 10.8 10*3/uL — AB (ref 1.7–7.7)
NEUTROS PCT: 76 %
Platelets: 436 10*3/uL — ABNORMAL HIGH (ref 150–400)
RBC: 3.87 MIL/uL (ref 3.87–5.11)
RDW: 15.6 % — ABNORMAL HIGH (ref 11.5–15.5)
WBC: 14.3 10*3/uL — AB (ref 4.0–10.5)

## 2018-01-27 LAB — BASIC METABOLIC PANEL
ANION GAP: 12 (ref 5–15)
BUN: 20 mg/dL (ref 6–20)
CHLORIDE: 94 mmol/L — AB (ref 101–111)
CO2: 24 mmol/L (ref 22–32)
Calcium: 9 mg/dL (ref 8.9–10.3)
Creatinine, Ser: 1.23 mg/dL — ABNORMAL HIGH (ref 0.44–1.00)
GFR calc Af Amer: 48 mL/min — ABNORMAL LOW (ref >60)
GFR calc non Af Amer: 42 mL/min — ABNORMAL LOW (ref >60)
Glucose, Bld: 165 mg/dL — ABNORMAL HIGH (ref 65–99)
POTASSIUM: 4 mmol/L (ref 3.5–5.1)
SODIUM: 130 mmol/L — AB (ref 135–145)

## 2018-01-27 MED ORDER — HYDROCODONE-ACETAMINOPHEN 5-325 MG PO TABS
1.0000 | ORAL_TABLET | ORAL | Status: DC | PRN
  Administered 2018-01-28 (×2): 1 via ORAL
  Filled 2018-01-27 (×2): qty 1

## 2018-01-27 MED ORDER — ONDANSETRON HCL 4 MG/2ML IJ SOLN
4.0000 mg | Freq: Four times a day (QID) | INTRAMUSCULAR | Status: DC | PRN
  Administered 2018-01-27: 23:00:00 4 mg via INTRAVENOUS

## 2018-01-27 MED ORDER — SODIUM CHLORIDE 0.9 % IV SOLN
INTRAVENOUS | Status: DC
  Administered 2018-01-27 – 2018-01-29 (×3): via INTRAVENOUS

## 2018-01-27 MED ORDER — SODIUM CHLORIDE 0.9 % IV SOLN
3.0000 g | Freq: Four times a day (QID) | INTRAVENOUS | Status: DC
  Administered 2018-01-27 – 2018-01-30 (×11): 3 g via INTRAVENOUS
  Filled 2018-01-27 (×14): qty 3

## 2018-01-27 MED ORDER — ONDANSETRON 4 MG PO TBDP
4.0000 mg | ORAL_TABLET | Freq: Four times a day (QID) | ORAL | Status: DC | PRN
Start: 2018-01-27 — End: 2018-01-30

## 2018-01-27 MED ORDER — MORPHINE SULFATE (PF) 2 MG/ML IV SOLN: 2.0000 mg | INTRAVENOUS | Status: DC | PRN

## 2018-01-27 NOTE — Plan of Care (Signed)
Care plan initiated.

## 2018-01-27 NOTE — H&P (Signed)
Kiara Brown Documented: 01/27/2018 3:35 PM Location: [facility] Patient #: [id number] DOB: 1941/03/22 Married / Language: English / Race: White Female   History of Present Illness  The patient is a 77 year old female who presents with a subcutaneous abscess. She is s/p right total mastectomy with axillary SLN bx on 07/02/17 by Kiara Brown. She tried one round of chemotherapy but she was unable to tolerate it. Her port was removed and she is on Anastrozole and Lapatinib. She was seen by Kiara Bowens, PA-C with oncology on 01/17/18 for right lateral mastectomy site swelling, pain, and redness. She then underwent ultrasound at [place] on 01/20/18 which showed a "clear seroma 5.3 cm in the low right axillary tail extending toward the mastectomy site. Abutting the axillary end of this seroma is a palpable, oval hypoechoic 3.8 cm fluid collection with slightly thickened superficial wall." The following day, 01/21/18, she underwent ultrasound-guided aspiration which she reports the fluid was purulent. Culture grew group B beta hemolytic strep and moderate number of gram-negative diplococci. She completed a course of Bactrim which was prescribed to her on 01/17/18 and is not taking any antibiotics currently.  She states she felt relief after the aspiration for 2 or 3 days but the area became swollen, more painful, and red. She reports low-grade fevers of 99 degrees the past few days. She is nauseated and has decreased appetite. She denies vomiting. She states she is generally otherwise healthy. She has rheumatoid arthritis but is not on immunosuppressive medication at this time. Not on any blood thinners.   Allergies  Oseltamivir Phosphate *ANTIVIRALS*  NSAIDs  DULoxetine HCl *ANTIDEPRESSANTS*  Allergies Reconciled    Medication History  Atenolol (100MG  Tablet, Oral) Active. Cymbalta (30MG  Capsule DR Part, Oral) Active. PriLOSEC (40MG  Capsule DR, Oral)  Active. Folic Acid (1MG  Tablet, Oral) Active. Wellbutrin SR (150MG  Tablet ER 12HR, Oral) Active. Colace (100MG  Capsule, Oral) Active. Reglan (5MG  Tablet, Oral) Active. Medications Reconciled   Review of Systems  General Present- Appetite Loss, Chills and Fever. Not Present- Fatigue, Night Sweats, Weight Gain and Weight Loss. HEENT Present- Wears glasses/contact lenses. Not Present- Earache, Hearing Loss, Hoarseness, Nose Bleed, Oral Ulcers, Ringing in the Ears, Seasonal Allergies, Sinus Pain, Sore Throat, Visual Disturbances and Yellow Eyes. Respiratory Present- Difficulty Breathing and Snoring. Not Present- Bloody sputum, Chronic Cough and Wheezing. Breast Present- Breast Mass, breast pain. Not Present- Nipple Discharge and Skin Changes. Cardiovascular Present- Leg Cramps, Shortness of Breath and Swelling of Extremities. Not Present- Chest Pain, Difficulty Breathing Lying Down, Palpitations and Rapid Heart Rate. Gastrointestinal Present- Nausea. Not Present- Abdominal Pain, Bloating, Bloody Stool, Change in Bowel Habits, Chronic diarrhea, Constipation, Difficulty Swallowing, Excessive gas, Gets full quickly at meals, Hemorrhoids, Indigestion, Rectal Pain and Vomiting. Female Genitourinary Not Present- Frequency, Nocturia, Painful Urination, Pelvic Pain and Urgency. Musculoskeletal Present- Back Pain, Joint Pain, Joint Stiffness, Muscle Pain and Swelling of Extremities. Not Present- Muscle Weakness. Neurological Present- Tingling and Weakness. Not Present- Decreased Memory, Fainting, Headaches, Numbness, Seizures, Tremor and Trouble walking. Psychiatric Present- Change in Sleep Pattern. Not Present- Anxiety, Bipolar, Depression, Fearful and Frequent crying. Hematology Present- Blood Thinners. Not Present- Easy Bruising, Excessive bleeding, Gland problems, HIV and Persistent Infections.   Vitals 01/27/2018 3:36 PM Weight: 245.6 lb Height: 63in Body Surface Area: 2.11 m Body Mass  Index: 43.51 kg/m  Temp.: 58F  Pulse: 92 (Regular)  BP: 122/76 (Sitting, Left Arm, Standard)   Physical Exam  GENERAL: Elderly female, seems fatigued and short of breath,  low energy  EYES: No scleral icterus Pupils equal, lids normal  EXTERNAL EARS: Intact, no masses or lesions EXTERNAL NOSE: Intact, no masses or lesions MOUTH: Lips - no lesions Dentition - normal for age  RESPIRATORY: Normal effort, no use of accessory muscles  MUSCULOSKELETAL: Normal gait Grossly normal ROM upper extremities Grossly normal ROM lower extremities  SKIN: Warm and dry Not diaphoretic  PSYCHIATRIC: Normal judgement and insight Normal mood and affect Alert, oriented x 3  Breast Note: Right mastectomy scar well healed with no surrounding erythema, induration, or fluctuance. No drainage On the lateral aspect of the incision, there is a large, swollen area that is exquisitely tender and has some erythema on the edges. No drainage. No obvious cellulitis/induration.  Procedure: Area was cleansed with ChloraPrep. Local anesthetic was infiltrated and 18-gauge needle was used to aspirate about 20 cc of dark purulent/bloody fluid. Dry gauze and tape were placed over the site.   Assessment & Plan  STATUS POST MASTECTOMY, RIGHT (Z90.11) Impression: She is status post right total mastectomy in August 2018 by Kiara Brown. She is presenting with a post mastectomy abscess which has failed treatment with antibiotics and aspiration. Kiara Brown evaluated the patient today and aspirated about 20 cc of dark purulent fluid from the site. Given her PE findings, including weakness, fatigue, and purulent drainage, he recommended to admit her for IV antibiotics and I&D in the OR tomorrow. However, per bed control, there are no available beds at this time. Kiara Brown ED was also contacted and reported no beds were available, but a report was given to them. She was told to go to the ER straight from  here. She and her sister provided verbal understanding. She is aware that she should be NPO after midnight.  Kiara Brown is aware. Orders are placed in Epic in case a bed with med-surg becomes available.  Signed electronically by Kiara Shropshire, PA C (01/27/2018 6:09 PM)

## 2018-01-27 NOTE — Telephone Encounter (Signed)
Returned pt's VM regarding she wants appt to be seen, she had a cyst under right arm drained but is now hard, swollen, and hot.  Returned pt's call, she denies fever.  Pt is able to come this morning, sent in-basket to scheduling, high priority to schedule pt this morning at 11 am with Sandi Mealy in symptom management.

## 2018-01-27 NOTE — Progress Notes (Deleted)
  The note originally documented on this encounter has been moved the the encounter in which it belongs.  

## 2018-01-27 NOTE — Progress Notes (Signed)
Symptoms Management Clinic Progress Note   Kiara Brown [id number] 1941-09-24 77 y.o.  Kiara Brown is managed by Dr. Jana Hakim  Actively treated with chemotherapy: no  Current Therapy: Anastrozole and lapatinib  Last Treated: 01/27/2018  Assessment: Plan:    Abscess of axilla, right   Abscess of the right axilla: I personally contacted Dr. Marland Kitchen [place]'s office and asked that they see Kiara Brown today.  Kiara Brown has been instructed to pick up a copy of her right axilla ultrasound images at [facility] and to present to Dr. Excell Seltzer office at 330 at which time she will be seen by their PA.  Please see After Visit Summary for patient specific instructions.  Future Appointments  Date Time Provider [place]  04/02/2018  2:30 PM [facility] None  04/02/2018  3:00 PM Magrinat, Virgie Dad, MD [facility] None    No orders of the defined types were placed in this encounter.      Subjective:   Patient ID:  Kiara Brown is a 77 y.o. (DOB Apr 05, 1941) female.  Chief Complaint: No chief complaint on file.   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  She continues on anastrozole and lapatinib.  She was last seen in our office on 01/17/2018 for right axillary pain with chills, neck pain, and headaches.  An ultrasound had been ordered at [facility].  Kiara Brown reports that this study was completed on 01/21/2018 with purulent material aspirated from the right axillary mass.  She  was told that the sample would be cultured to see if she was on the right antibiotic. She was given a prescription for Bactrim DS p.o. twice daily for 14 days on 01/17/2018.  She has not received a call from [place] with the results of her culture.  She contacted our office this morning stating that she wanted to be seen.  She stated that the cyst  under her right arm was larger, tender and hot. She has had a low-grade fever.  Medications: I have reviewed the patient's current medications.  Allergies:  Allergies  Allergen Reactions  . Duloxetine Other (See Comments)    UNSPECIFIED REACTION   . Nsaids Rash  . Oseltamivir Diarrhea and Other (See Comments)    Abdominal pain     Past Medical History:  Diagnosis Date  . Arthritis    osteo, rheumatoid  . Breast cancer ([place])   . Diverticulosis   . Dysrhythmia    hx rapid heartbeat, no cardiologist  . Family history of breast cancer   . Family history of prostate cancer   . Fibromyalgia    peripheral neuropathy  . Fibromyalgia   . GERD (gastroesophageal reflux disease)   . H/O hiatal hernia   . Hyperlipidemia   . PONV (postoperative nausea and vomiting)    "THROAT WITH EXTREME [id number], ulnar nerve surgery  . Sciatica   . Shortness of breath dyspnea    with exertion  . UTI (lower urinary tract infection)     Past Surgical History:  Procedure Laterality Date  . ABDOMINAL HYSTERECTOMY    . APPENDECTOMY    . back injection      July 2018- x1  . BREAST SURGERY Left 1986   biopsy, Recent 05/2017 at [facility]- had biopsy on R breast- malignant   . CARDIAC CATHETERIZATION  2009   "  no blockage per pt"  . CHOLECYSTECTOMY    . CYSTOSCOPY WITH BIOPSY    . INCONTINENCE SURGERY    . JOINT REPLACEMENT    . KNEE ARTHROSCOPY     bilateral  . KNEE ARTHROSCOPY Left 03/17/2014   Procedure: ARTHROSCOPY LEFT KNEE WITH SYNOVECTOMY;  Surgeon: Gearlean Alf, MD;  Location: [facility];  Service: Orthopedics;  Laterality: Left;  . PORTACATH PLACEMENT Left 07/02/2017   Procedure: INSERTION PORT-A-CATH;  Surgeon: Excell Seltzer, MD;  Location: [place];  Service: General;  Laterality: Left;  . RIGHT/LEFT HEART CATH AND CORONARY ANGIOGRAPHY N/A 08/30/2017   Procedure: RIGHT/LEFT HEART CATH AND CORONARY ANGIOGRAPHY;  Surgeon: Larey Dresser, MD;  Location: [place] [facility];  Service:  Cardiovascular;  Laterality: N/A;  . ROTATOR CUFF REPAIR     right  . SIMPLE MASTECTOMY WITH AXILLARY SENTINEL NODE BIOPSY Right 07/02/2017   Procedure: RIGHT TOTAL  MASTECTOMY WITH AXILLARY SENTINEL LYMPH  NODE BIOPSY;  Surgeon: Excell Seltzer, MD;  Location: [facility] [place] [facility];  Service: General;  Laterality: Right;  . TOTAL KNEE ARTHROPLASTY  08/04/2012   Procedure: TOTAL KNEE ARTHROPLASTY;  Surgeon: Gearlean Alf, MD;  Location: [facility];  Service: Orthopedics;  Laterality: Left;  . TOTAL KNEE ARTHROPLASTY Right 04/11/2015   Procedure: RIGHT TOTAL KNEE ARTHROPLASTY;  Surgeon: Gaynelle Arabian, MD;  Location: [facility];  Service: Orthopedics;  Laterality: Right;  . ulnar nerve surgery Right 1986    Family History  Problem Relation Age of Onset  . Hypertension Mother   . Stroke Mother   . Breast cancer Mother 75       died at 65  . Hypertension Father   . Heart attack Father        died at 55  . Hypertension Brother        MI  . Prostate cancer Brother 44  . Hypertension Brother        MI  . Lung cancer Brother        possible lung cancer, spot found on lung being monitored  . Hypertension Brother        MI  . Non-Hodgkin's lymphoma Sister 58  . Breast cancer Other 54       is currently 90  . Breast cancer Other 55  . Cancer Cousin 62       type of cancer unk.   . Cancer Other        type unknown, eye/face   . Cancer Other 92       died from cancer in his 5's, type unknown    Social History   Socioeconomic History  . Marital status: Married    Spouse name: Not on file  . Number of children: Not on file  . Years of education: Not on file  . Highest education level: Not on file  Social Needs  . Financial resource strain: Not on file  . Food insecurity - worry: Not on file  . Food insecurity - inability: Not on file  . Transportation needs - medical: Not on file  . Transportation needs - non-medical: Not on file  Occupational History  . Not on file  Tobacco Use  . Smoking  status: Never Smoker  . Smokeless tobacco: Never Used  Substance and Sexual Activity  . Alcohol use: No  . Drug use: No  . Sexual activity: Not on file  Other Topics Concern  . Not on file  Social History Narrative  . Not on file  Past Medical History, Surgical history, Social history, and Family history were reviewed and updated as appropriate.   Please see review of systems for further details on the patient's review from today.   Review of Systems:  Review of Systems  Constitutional: Positive for chills and fever. Negative for diaphoresis.  Skin:       Tender, hot, enlarged right axillary mass.    Objective:   Physical Exam:  BP 135/66 (BP Location: Left Wrist, Patient Position: Sitting)   Pulse 80   Temp 98.5 F (36.9 C) (Oral)   Resp 20   Ht '5\' 8"'$  (1.727 m)   Wt 249 lb (112.9 kg)   SpO2 100%   BMI 37.86 kg/m  ECOG: 0  Physical Exam  Constitutional: No distress.  HENT:  Head: Normocephalic and atraumatic.  Neurological: She is alert. Coordination normal.  Skin: She is not diaphoretic.  There is an 8 x 6 cm raised, fluctuant, tender and warm mass in the right anterior axillary line.  Psychiatric: She has a normal mood and affect. Her behavior is normal. Judgment and thought content normal.    Lab Review:     Component Value Date/Time   NA 137 01/13/2018 1221   NA 131 (L) 09/06/2017 1018   K 3.6 01/13/2018 1221   K 4.2 09/06/2017 1018   CL 101 01/13/2018 1221   CO2 24 01/13/2018 1221   CO2 25 09/06/2017 1018   GLUCOSE 160 (H) 01/13/2018 1221   GLUCOSE 187 (H) 09/06/2017 1018   BUN 15 01/13/2018 1221   BUN 11.2 09/06/2017 1018   CREATININE 1.21 (H) 01/13/2018 1221   CREATININE 1.2 (H) 09/06/2017 1018   CALCIUM 9.4 01/13/2018 1221   CALCIUM 9.3 09/06/2017 1018   PROT 7.2 01/13/2018 1221   PROT 6.9 09/06/2017 1018   ALBUMIN 3.4 (L) 01/13/2018 1221   ALBUMIN 3.4 (L) 09/06/2017 1018   AST 15 01/13/2018 1221   AST 20 09/06/2017 1018   ALT 11  01/13/2018 1221   ALT 14 09/06/2017 1018   ALKPHOS 110 01/13/2018 1221   ALKPHOS 103 09/06/2017 1018   BILITOT 0.5 01/13/2018 1221   BILITOT 0.57 09/06/2017 1018   GFRNONAA 42 (L) 01/13/2018 1221   GFRAA 49 (L) 01/13/2018 1221       Component Value Date/Time   WBC 6.5 01/13/2018 1221   RBC 3.99 01/13/2018 1221   HGB 10.2 (L) 01/13/2018 1221   HGB 10.1 (L) 09/06/2017 1018   HCT 32.2 (L) 01/13/2018 1221   HCT 30.9 (L) 09/06/2017 1018   PLT 357 01/13/2018 1221   PLT 328 09/06/2017 1018   MCV 80.6 01/13/2018 1221   MCV 81.5 09/06/2017 1018   MCH 25.6 01/13/2018 1221   MCHC 31.7 01/13/2018 1221   RDW 16.4 (H) 01/13/2018 1221   RDW 16.6 (H) 09/06/2017 1018   LYMPHSABS 1.9 01/13/2018 1221   LYMPHSABS 1.8 09/06/2017 1018   MONOABS 0.6 01/13/2018 1221   MONOABS 0.6 09/06/2017 1018   EOSABS 0.1 01/13/2018 1221   EOSABS 0.1 09/06/2017 1018   BASOSABS 0.1 01/13/2018 1221   BASOSABS 0.1 09/06/2017 1018   -------------------------------  Imaging from last 24 hours (if applicable):  Radiology interpretation: No results found.

## 2018-01-28 ENCOUNTER — Encounter (HOSPITAL_COMMUNITY): Payer: Self-pay | Admitting: Anesthesiology

## 2018-01-28 ENCOUNTER — Inpatient Hospital Stay (HOSPITAL_COMMUNITY): Payer: Medicare Other | Admitting: Anesthesiology

## 2018-01-28 ENCOUNTER — Encounter (HOSPITAL_COMMUNITY)
Admission: AD | Disposition: A | Discharge status: Home Health | Payer: Self-pay | Source: Ambulatory Visit | Attending: General Surgery

## 2018-01-28 DIAGNOSIS — L02213 Cutaneous abscess of chest wall: Secondary | ICD-10-CM | POA: Diagnosis not present

## 2018-01-28 DIAGNOSIS — Z9011 Acquired absence of right breast and nipple: Secondary | ICD-10-CM | POA: Diagnosis not present

## 2018-01-28 DIAGNOSIS — Y848 Other medical procedures as the cause of abnormal reaction of the patient, or of later complication, without mention of misadventure at the time of the procedure: Secondary | ICD-10-CM | POA: Diagnosis present

## 2018-01-28 DIAGNOSIS — M069 Rheumatoid arthritis, unspecified: Secondary | ICD-10-CM | POA: Diagnosis not present

## 2018-01-28 DIAGNOSIS — I129 Hypertensive chronic kidney disease with stage 1 through stage 4 chronic kidney disease, or unspecified chronic kidney disease: Secondary | ICD-10-CM | POA: Diagnosis not present

## 2018-01-28 DIAGNOSIS — T8141XA Infection following a procedure, superficial incisional surgical site, initial encounter: Secondary | ICD-10-CM | POA: Diagnosis present

## 2018-01-28 DIAGNOSIS — K219 Gastro-esophageal reflux disease without esophagitis: Secondary | ICD-10-CM | POA: Diagnosis not present

## 2018-01-28 DIAGNOSIS — Z9221 Personal history of antineoplastic chemotherapy: Secondary | ICD-10-CM | POA: Diagnosis not present

## 2018-01-28 DIAGNOSIS — N182 Chronic kidney disease, stage 2 (mild): Secondary | ICD-10-CM | POA: Diagnosis not present

## 2018-01-28 DIAGNOSIS — Z79899 Other long term (current) drug therapy: Secondary | ICD-10-CM | POA: Diagnosis not present

## 2018-01-28 DIAGNOSIS — Z6841 Body Mass Index (BMI) 40.0 and over, adult: Secondary | ICD-10-CM | POA: Diagnosis not present

## 2018-01-28 DIAGNOSIS — Z79811 Long term (current) use of aromatase inhibitors: Secondary | ICD-10-CM | POA: Diagnosis not present

## 2018-01-28 DIAGNOSIS — I10 Essential (primary) hypertension: Secondary | ICD-10-CM | POA: Diagnosis present

## 2018-01-28 DIAGNOSIS — L02411 Cutaneous abscess of right axilla: Secondary | ICD-10-CM | POA: Diagnosis not present

## 2018-01-28 DIAGNOSIS — Z853 Personal history of malignant neoplasm of breast: Secondary | ICD-10-CM | POA: Diagnosis not present

## 2018-01-28 DIAGNOSIS — Z888 Allergy status to other drugs, medicaments and biological substances status: Secondary | ICD-10-CM | POA: Diagnosis not present

## 2018-01-28 DIAGNOSIS — E1122 Type 2 diabetes mellitus with diabetic chronic kidney disease: Secondary | ICD-10-CM | POA: Diagnosis not present

## 2018-01-28 HISTORY — PX: IRRIGATION AND DEBRIDEMENT ABSCESS: SHX5252

## 2018-01-28 LAB — BASIC METABOLIC PANEL
Anion gap: 11 (ref 5–15)
BUN: 19 mg/dL (ref 6–20)
CO2: 24 mmol/L (ref 22–32)
CREATININE: 1.18 mg/dL — AB (ref 0.44–1.00)
Calcium: 8.5 mg/dL — ABNORMAL LOW (ref 8.9–10.3)
Chloride: 97 mmol/L — ABNORMAL LOW (ref 101–111)
GFR calc Af Amer: 51 mL/min — ABNORMAL LOW (ref >60)
GFR calc non Af Amer: 44 mL/min — ABNORMAL LOW (ref >60)
Glucose, Bld: 160 mg/dL — ABNORMAL HIGH (ref 65–99)
POTASSIUM: 4.1 mmol/L (ref 3.5–5.1)
SODIUM: 132 mmol/L — AB (ref 135–145)

## 2018-01-28 LAB — SURGICAL PCR SCREEN
MRSA, PCR: NEGATIVE
Staphylococcus aureus: NEGATIVE

## 2018-01-28 LAB — CBC
HEMATOCRIT: 31.7 % — AB (ref 36.0–46.0)
Hemoglobin: 10 g/dL — ABNORMAL LOW (ref 12.0–15.0)
MCH: 25.8 pg — AB (ref 26.0–34.0)
MCHC: 31.5 g/dL (ref 30.0–36.0)
MCV: 81.7 fL (ref 78.0–100.0)
Platelets: 451 10*3/uL — ABNORMAL HIGH (ref 150–400)
RBC: 3.88 MIL/uL (ref 3.87–5.11)
RDW: 16.1 % — AB (ref 11.5–15.5)
WBC: 18.7 10*3/uL — AB (ref 4.0–10.5)

## 2018-01-28 SURGERY — IRRIGATION AND DEBRIDEMENT ABSCESS
Anesthesia: General | Laterality: Right

## 2018-01-28 MED ORDER — LIDOCAINE 2% (20 MG/ML) 5 ML SYRINGE
INTRAMUSCULAR | Status: AC
  Filled 2018-01-28: qty 5

## 2018-01-28 MED ORDER — EPHEDRINE SULFATE 50 MG/ML IJ SOLN
INTRAMUSCULAR | Status: DC | PRN
  Administered 2018-01-28 (×2): 10 mg via INTRAVENOUS
  Administered 2018-01-28: 5 mg via INTRAVENOUS

## 2018-01-28 MED ORDER — BUPIVACAINE-EPINEPHRINE (PF) 0.5% -1:200000 IJ SOLN
INTRAMUSCULAR | Status: AC
  Filled 2018-01-28: qty 30

## 2018-01-28 MED ORDER — 0.9 % SODIUM CHLORIDE (POUR BTL) OPTIME
TOPICAL | Status: DC | PRN
  Administered 2018-01-28: 1000 mL

## 2018-01-28 MED ORDER — FENTANYL CITRATE (PF) 100 MCG/2ML IJ SOLN
INTRAMUSCULAR | Status: AC
  Filled 2018-01-28: qty 2

## 2018-01-28 MED ORDER — LACTATED RINGERS IV SOLN
INTRAVENOUS | Status: DC | PRN
  Administered 2018-01-28: 1000 mL

## 2018-01-28 MED ORDER — FENTANYL CITRATE (PF) 100 MCG/2ML IJ SOLN
INTRAMUSCULAR | Status: DC | PRN
  Administered 2018-01-28: 50 ug via INTRAVENOUS
  Administered 2018-01-28: 25 ug via INTRAVENOUS
  Administered 2018-01-28: 50 ug via INTRAVENOUS

## 2018-01-28 MED ORDER — PHENYLEPHRINE 40 MCG/ML (10ML) SYRINGE FOR IV PUSH (FOR BLOOD PRESSURE SUPPORT)
PREFILLED_SYRINGE | INTRAVENOUS | Status: AC
  Filled 2018-01-28: qty 10

## 2018-01-28 MED ORDER — EPHEDRINE 5 MG/ML INJ
INTRAVENOUS | Status: AC
  Filled 2018-01-28: qty 10

## 2018-01-28 MED ORDER — ONDANSETRON HCL 4 MG/2ML IJ SOLN
INTRAMUSCULAR | Status: DC | PRN
  Administered 2018-01-28: 4 mg via INTRAVENOUS

## 2018-01-28 MED ORDER — ONDANSETRON HCL 4 MG/2ML IJ SOLN
INTRAMUSCULAR | Status: AC
Start: 2018-01-28 — End: 2018-01-28
  Filled 2018-01-28: qty 2

## 2018-01-28 MED ORDER — ALBUMIN HUMAN 5 % IV SOLN
INTRAVENOUS | Status: DC | PRN
  Administered 2018-01-28: 15:00:00 via INTRAVENOUS

## 2018-01-28 MED ORDER — BUPIVACAINE-EPINEPHRINE 0.5% -1:200000 IJ SOLN
INTRAMUSCULAR | Status: DC | PRN
  Administered 2018-01-28: 30 mL

## 2018-01-28 MED ORDER — ALBUMIN HUMAN 5 % IV SOLN
INTRAVENOUS | Status: AC
  Filled 2018-01-28: qty 250

## 2018-01-28 MED ORDER — PHENYLEPHRINE HCL 10 MG/ML IJ SOLN
INTRAMUSCULAR | Status: DC | PRN
Start: 2018-01-28 — End: 2018-01-28
  Administered 2018-01-28 (×6): 80 ug via INTRAVENOUS

## 2018-01-28 MED ORDER — PROMETHAZINE HCL 25 MG/ML IJ SOLN: 6.2500 mg | INTRAMUSCULAR | Status: DC | PRN

## 2018-01-28 MED ORDER — PROPOFOL 10 MG/ML IV BOLUS
INTRAVENOUS | Status: AC
  Filled 2018-01-28: qty 20

## 2018-01-28 MED ORDER — SCOPOLAMINE 1 MG/3DAYS TD PT72
1.0000 | MEDICATED_PATCH | TRANSDERMAL | Status: DC
  Administered 2018-01-28: 1.5 mg via TRANSDERMAL

## 2018-01-28 MED ORDER — PROPOFOL 10 MG/ML IV BOLUS
INTRAVENOUS | Status: DC | PRN
  Administered 2018-01-28: 200 mg via INTRAVENOUS

## 2018-01-28 MED ORDER — LIDOCAINE 2% (20 MG/ML) 5 ML SYRINGE
INTRAMUSCULAR | Status: DC | PRN
  Administered 2018-01-28: 100 mg via INTRAVENOUS

## 2018-01-28 MED ORDER — HYDROMORPHONE HCL 1 MG/ML IJ SOLN: 0.2500 mg | INTRAMUSCULAR | Status: DC | PRN

## 2018-01-28 SURGICAL SUPPLY — 28 items
BINDER BREAST XLRG (GAUZE/BANDAGES/DRESSINGS) ×2 IMPLANT
BLADE CLIPPER SURG (BLADE) IMPLANT
BLADE SURG 15 STRL LF DISP TIS (BLADE) ×1 IMPLANT
BLADE SURG 15 STRL SS (BLADE) ×3
BNDG GAUZE ELAST 4 BULKY (GAUZE/BANDAGES/DRESSINGS) ×2 IMPLANT
CHLORAPREP W/TINT 26ML (MISCELLANEOUS) ×2 IMPLANT
COVER SURGICAL LIGHT HANDLE (MISCELLANEOUS) ×3 IMPLANT
DRAIN PENROSE 18X1/2 LTX STRL (DRAIN) ×2 IMPLANT
DRAPE LAPAROSCOPIC ABDOMINAL (DRAPES) ×3 IMPLANT
DRAPE LG THREE QUARTER DISP (DRAPES) ×2 IMPLANT
ELECT PENCIL ROCKER SW 15FT (MISCELLANEOUS) ×1 IMPLANT
ELECT REM PT RETURN 15FT ADLT (MISCELLANEOUS) ×3 IMPLANT
GAUZE SPONGE 4X4 12PLY STRL (GAUZE/BANDAGES/DRESSINGS) ×3 IMPLANT
GLOVE BIOGEL PI IND STRL 8 (GLOVE) ×1 IMPLANT
GLOVE BIOGEL PI INDICATOR 8 (GLOVE) ×2
GLOVE SURG SS PI 7.5 STRL IVOR (GLOVE) ×3 IMPLANT
GOWN STRL REUS W/TWL LRG LVL3 (GOWN DISPOSABLE) ×1 IMPLANT
GOWN STRL REUS W/TWL XL LVL3 (GOWN DISPOSABLE) ×5 IMPLANT
KIT BASIN OR (CUSTOM PROCEDURE TRAY) ×3 IMPLANT
NS IRRIG 1000ML POUR BTL (IV SOLUTION) ×1 IMPLANT
PACK GENERAL/GYN (CUSTOM PROCEDURE TRAY) ×3 IMPLANT
PAD ABD 8X10 STRL (GAUZE/BANDAGES/DRESSINGS) ×2 IMPLANT
SPONGE LAP 18X18 X RAY DECT (DISPOSABLE) ×1 IMPLANT
SUT ETHILON 3 0 PS 1 (SUTURE) ×2 IMPLANT
SWAB CULTURE ESWAB REG 1ML (MISCELLANEOUS) IMPLANT
SYR BULB IRRIGATION 50ML (SYRINGE) ×1 IMPLANT
TOWEL OR 17X26 10 PK STRL BLUE (TOWEL DISPOSABLE) ×3 IMPLANT
YANKAUER SUCT BULB TIP NO VENT (SUCTIONS) ×3 IMPLANT

## 2018-01-28 NOTE — Anesthesia Postprocedure Evaluation (Signed)
Anesthesia Post Note  Patient: Kiara Brown  Procedure(s) Performed: IRRIGATION AND DEBRIDEMENT AXILLARY ABSCESS (Right )     Patient location during evaluation: PACU Anesthesia Type: General Level of consciousness: sedated Pain management: pain level controlled Vital Signs Assessment: post-procedure vital signs reviewed and stable Respiratory status: spontaneous breathing and respiratory function stable Cardiovascular status: stable Postop Assessment: no apparent nausea or vomiting Anesthetic complications: no    Last Vitals:  Vitals:   01/28/18 1527 01/28/18 1540  BP: (!) 128/48 (!) 122/44  Pulse: 81 81  Resp: (!) 23 20  Temp: 37.3 C (!) 38.2 C  SpO2: 95% 94%    Last Pain:  Vitals:   01/28/18 1540  TempSrc: Oral  PainSc:                  Xzaria Teo DANIEL

## 2018-01-28 NOTE — Op Note (Signed)
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  She had been doing well from the surgery for a number of months but about 2 weeks ago developed pain and swelling in the anterior portion of her right axilla associated with some fever.  Aspiration revealed purulent fluid.  Cultures have grown strep and diphtheroids.  She was started on oral antibiotics.  She did well initially but then had recurrent symptoms and yesterday again underwent aspiration with return of purulent fluid.  She was febrile and appeared somewhat ill and was admitted for IV antibiotics and more extensive incision and drainage.  We have previously discussed the procedure and indications and nature and recovery and she agrees to proceed.    Procedure Detail: Patient was brought to the operating room placed in supine position on the operating table and general LMA anesthesia induced.  She was positioned with her right arm carefully extended.  She was already on broad-spectrum IV antibiotics.  The entire right anterior chest and axilla and upper arm were widely sterilely prepped and draped.  Patient timeout was performed and correct procedure verified.  I initially made a small incision in a skin fold overlying the central area of swelling in the anterior right axilla.  There was a small superficial collection of purulence entered but as I traced this down this went into a much larger pocket of thin purulent fluid with about a 5 or 6 cm cavity just beneath the lateral border of the pectoralis extending down to the  axilla and lateral mastectomy incision.  As further loculations were broken up this also extended extensively medially underneath her skin flaps where another large area of purulent fluid was evacuated.  The loculations were completely broken up and I extended the lateral incision to a total of about 5 cm to allow wide drainage.  There was no necrotic tissue.  As the cavity extended all the way to the medial aspect of her mastectomy incision I made a 2 cm counterincision at the medial aspect and passed a 1 inch Penrose drain between the 2 incisions and secured on either end with nylon to allow continuous drainage.  The wound was thoroughly irrigated with saline until clear.  I packed with moist saline Kerlix extensively underneath both flaps and into the axilla from the lateral incision.  Sponge needle and instrument counts were correct.  Dry sterile dressings applied.    Findings: As above  Estimated Blood Loss:  Minimal         Drains: 1 inch Penrose drain and wound packed open with moist Kerlix  Blood Given: none          Specimens: None        Complications:  * No complications entered in OR log *         Disposition: PACU - hemodynamically stable.         Condition: stable

## 2018-01-28 NOTE — Progress Notes (Signed)
Patient ID: Kiara Brown, female   DOB: Apr 03, 1941, 77 y.o.   MRN: [id number]     Subjective: She feels somewhat better today after aspiration in the office yesterday.  Still however with some swelling and discomfort.  Objective: Vital signs in last 24 hours: Temp:  [98.5 F (36.9 C)-100.9 F (38.3 C)] 100.9 F (38.3 C) (03/05 2725) Pulse Rate:  [80-101] 101 (03/05 0608) Resp:  [20] 20 (03/05 0608) BP: (135-155)/(50-66) 155/57 (03/05 0608) SpO2:  [94 %-100 %] 94 % (03/05 3664) Weight:  [112.9 kg (249 lb)] 112.9 kg (249 lb) (03/04 2022) Last BM Date: 01/26/18  Intake/Output from previous day: 03/04 0701 - 03/05 0700 In: 703.3 [P.O.:200; I.V.:403.3; IV Piggyback:100] Out: -  Intake/Output this shift: No intake/output data recorded.  General appearance: alert, cooperative, no distress and moderately obese Chest wall: no tenderness, There is a approximately 5 x 7 cm area of swelling, slight tenderness and mild erythema in the anterior right axilla extending up onto the chest wall superior to her previous mastectomy incision.  Lab Results:  Recent Labs    01/27/18 1910 01/28/18 0542  WBC 14.3* 18.7*  HGB 9.8* 10.0*  HCT 30.8* 31.7*  PLT 436* 451*   BMET Recent Labs    01/27/18 1900 01/28/18 0542  NA 130* 132*  K 4.0 4.1  CL 94* 97*  CO2 24 24  GLUCOSE 165* 160*  BUN 20 19  CREATININE 1.23* 1.18*  CALCIUM 9.0 8.5*     Studies/Results: No results found.  Anti-infectives: Anti-infectives (From admission, onward)   Start     Dose/Rate Route Frequency Ordered Stop   01/27/18 1845  Ampicillin-Sulbactam (UNASYN) 3 g in sodium chloride 0.9 % 100 mL IVPB     3 g 200 mL/hr over 30 Minutes Intravenous Every 6 hours 01/27/18 1832        Assessment/Plan: Right axillary/chest wall abscess with history of total mastectomy 8 months ago.  Was aspirated and placed on antibiotics about 9 days ago with temporary improvement and then recurrent symptoms.  Has significant  leukocytosis and fever.  Cultures have grown strep as well as gram-negative diphtheroids.  On Unasyn.  Etiology not completely clear this far postoperatively.  Question infection of chronic seroma?  I recommended proceeding with incision and drainage and debridement under general anesthesia today.  Discussed the nature of the procedure and indications and risks of bleeding, infection, wound healing problems and anesthetic complications.  She understands and agrees to proceed.    LOS: 0 days    Edward Jolly 01/28/2018

## 2018-01-28 NOTE — Transfer of Care (Signed)
Immediate Anesthesia Transfer of Care Note  Patient: Kiara Brown  Procedure(s) Performed: IRRIGATION AND DEBRIDEMENT AXILLARY ABSCESS (Right )  Patient Location: PACU  Anesthesia Type:General  Level of Consciousness: awake, alert  and oriented  Airway & Oxygen Therapy: Patient Spontanous Breathing and Patient connected to face mask oxygen  Post-op Assessment: Report given to RN and Post -op Vital signs reviewed and stable  Post vital signs: Reviewed and stable  Last Vitals:  Vitals:   01/28/18 0608 01/28/18 1458  BP: (!) 155/57   Pulse: (!) 101   Resp: 20   Temp: (!) 38.3 C 37.4 C  SpO2: 94%     Last Pain:  Vitals:   01/28/18 1312  TempSrc:   PainSc: 4       Patients Stated Pain Goal: 2 (93/23/55 7322)  Complications: No apparent anesthesia complications

## 2018-01-28 NOTE — Anesthesia Preprocedure Evaluation (Addendum)
Anesthesia Evaluation  Patient identified by MRN, date of birth, ID band Patient awake    Reviewed: Allergy & Precautions, NPO status , Patient's Chart, lab work & pertinent test results, reviewed documented beta blocker date and time   History of Anesthesia Complications (+) PONV and history of anesthetic complications  Airway Mallampati: II  TM Distance: >3 FB Neck ROM: Full    Dental no notable dental hx. (+) Dental Advisory Given   Pulmonary neg pulmonary ROS,    Pulmonary exam normal        Cardiovascular hypertension, Pt. on home beta blockers negative cardio ROS Normal cardiovascular exam  ------------------------------------------------------------------- Study Conclusions  - Left ventricle: The cavity size was normal. Wall thickness was   increased in a pattern of mild LVH. Systolic function was normal.   The estimated ejection fraction was in the range of 60% to 65%.   Wall motion was normal; there were no regional wall motion   abnormalities.    Neuro/Psych negative neurological ROS  negative psych ROS   GI/Hepatic Neg liver ROS, hiatal hernia, GERD  ,  Endo/Other  diabetesMorbid obesity  Renal/GU negative Renal ROS     Musculoskeletal   Abdominal   Peds  Hematology negative hematology ROS (+)   Anesthesia Other Findings   Reproductive/Obstetrics                             Anesthesia Physical  Anesthesia Plan  ASA: III  Anesthesia Plan: General   Post-op Pain Management:    Induction: Intravenous  PONV Risk Score and Plan: 4 or greater and Ondansetron, Dexamethasone, Diphenhydramine and Treatment may vary due to age or medical condition  Airway Management Planned: LMA and Oral ETT  Additional Equipment:   Intra-op Plan:   Post-operative Plan: Extubation in OR  Informed Consent: I have reviewed the patients History and Physical, chart, labs and discussed  the procedure including the risks, benefits and alternatives for the proposed anesthesia with the patient or authorized representative who has indicated his/her understanding and acceptance.   Dental advisory given  Plan Discussed with: CRNA, Anesthesiologist and Surgeon  Anesthesia Plan Comments:       Anesthesia Quick Evaluation

## 2018-01-29 ENCOUNTER — Encounter (HOSPITAL_COMMUNITY): Payer: Self-pay | Admitting: General Surgery

## 2018-01-29 ENCOUNTER — Telehealth: Payer: Self-pay | Admitting: Physical Therapy

## 2018-01-29 MED ORDER — OXYCODONE HCL 5 MG PO TABS
5.0000 mg | ORAL_TABLET | Freq: Four times a day (QID) | ORAL | Status: DC | PRN
  Administered 2018-01-30: 5 mg via ORAL
  Filled 2018-01-29 (×2): qty 1

## 2018-01-29 MED ORDER — ACETAMINOPHEN 325 MG PO TABS
650.0000 mg | ORAL_TABLET | Freq: Four times a day (QID) | ORAL | Status: DC | PRN
  Administered 2018-01-29 (×2): 650 mg via ORAL
  Filled 2018-01-29 (×2): qty 2

## 2018-01-29 NOTE — Progress Notes (Signed)
Patient ID: Kiara Brown, female   DOB: 1941-11-24, 77 y.o.   MRN: [id number] 1 Day Post-Op   Subjective: Has some surgical site pain but overall feels better than before surgery.  Objective: Vital signs in last 24 hours: Temp:  [98.2 F (36.8 C)-100.8 F (38.2 C)] 98.8 F (37.1 C) (03/06 9191) Pulse Rate:  [73-81] 81 (03/06 0632) Resp:  [16-23] 16 (03/06 6606) BP: (113-148)/(41-59) 120/43 (03/06 0632) SpO2:  [93 %-100 %] 94 % (03/06 0045) Weight:  [112.9 kg (249 lb)] 112.9 kg (249 lb) (03/05 1312) Last BM Date: 01/27/18  Intake/Output from previous day: 03/05 0701 - 03/06 0700 In: 5206.7 [P.O.:1180; I.V.:3576.7; IV Piggyback:450] Out: 9977 [Urine:1400; Blood:5] Intake/Output this shift: No intake/output data recorded.  General appearance: alert, cooperative and no distress Incision/Wound: After dressings changed.  Packing intact.  Appropriate serosanguineous drainage.  No significant erythema.  Lab Results:  Recent Labs    01/27/18 1910 01/28/18 0542  WBC 14.3* 18.7*  HGB 9.8* 10.0*  HCT 30.8* 31.7*  PLT 436* 451*   BMET Recent Labs    01/27/18 1900 01/28/18 0542  NA 130* 132*  K 4.0 4.1  CL 94* 97*  CO2 24 24  GLUCOSE 165* 160*  BUN 20 19  CREATININE 1.23* 1.18*  CALCIUM 9.0 8.5*     Studies/Results: No results found.  Anti-infectives: Anti-infectives (From admission, onward)   Start     Dose/Rate Route Frequency Ordered Stop   01/27/18 1845  Ampicillin-Sulbactam (UNASYN) 3 g in sodium chloride 0.9 % 100 mL IVPB     3 g 200 mL/hr over 30 Minutes Intravenous Every 6 hours 01/27/18 1832        Assessment/Plan: s/p Procedure(s): IRRIGATION AND DEBRIDEMENT AXILLARY ABSCESS Stable postoperatively.  Continue IV antibiotics today.  Plan to change packing tomorrow.  Repeat CBC tomorrow.  If all improving could go home possibly tomorrow.  Will need home health for assistance with wound care   LOS: 1 day    Edward Jolly 01/29/2018

## 2018-01-29 NOTE — Plan of Care (Signed)
Pain is being managed

## 2018-01-30 LAB — CBC
HCT: 24.9 % — ABNORMAL LOW (ref 36.0–46.0)
Hemoglobin: 8 g/dL — ABNORMAL LOW (ref 12.0–15.0)
MCH: 26.9 pg (ref 26.0–34.0)
MCHC: 32.1 g/dL (ref 30.0–36.0)
MCV: 83.8 fL (ref 78.0–100.0)
Platelets: 371 10*3/uL (ref 150–400)
RBC: 2.97 MIL/uL — AB (ref 3.87–5.11)
RDW: 16 % — ABNORMAL HIGH (ref 11.5–15.5)
WBC: 9.1 10*3/uL (ref 4.0–10.5)

## 2018-01-30 MED ORDER — MENTHOL 3 MG MT LOZG: 1.0000 | LOZENGE | OROMUCOSAL | Status: DC | PRN

## 2018-01-30 MED ORDER — OXYCODONE HCL 5 MG PO TABS: 5.0000 mg | ORAL_TABLET | Freq: Four times a day (QID) | ORAL | 0 refills | Status: DC | PRN

## 2018-01-30 MED ORDER — AMOXICILLIN-POT CLAVULANATE 875-125 MG PO TABS: 1.0000 | ORAL_TABLET | Freq: Two times a day (BID) | ORAL | 0 refills | Status: AC

## 2018-01-30 NOTE — Progress Notes (Signed)
Patient ID: KASSADEE CARAWAN, female   DOB: 1941-11-05, 77 y.o.   MRN: [id number] 2 Days Post-Op   Subjective: Feeling much better today.  No specific complaints.  Objective: Vital signs in last 24 hours: Temp:  [97.8 F (36.6 C)-99.2 F (37.3 C)] 99.2 F (37.3 C) (03/07 0520) Pulse Rate:  [77-96] 96 (03/07 0520) Resp:  [15-16] 15 (03/07 0520) BP: (112-145)/(41-52) 145/41 (03/07 0520) SpO2:  [97 %-99 %] 97 % (03/07 0520) Last BM Date: 01/27/18  Intake/Output from previous day: 03/06 0701 - 03/07 0700 In: 3393.3 [P.O.:580; I.V.:2313.3; IV Piggyback:500] Out: 800 [Urine:800] Intake/Output this shift: No intake/output data recorded.  General appearance: alert, cooperative and no distress Incision/Wound: Packing removed from incision.  Small to moderate amount of drainage on the packing which was all removed from the axillary incision and underneath the flaps.  No erythema.  Axillary incision repacked with moist saline gauze and Penrose drain left in place.  Lab Results:  Recent Labs    01/28/18 0542 01/30/18 0537  WBC 18.7* 9.1  HGB 10.0* 8.0*  HCT 31.7* 24.9*  PLT 451* 371   BMET Recent Labs    01/27/18 1900 01/28/18 0542  NA 130* 132*  K 4.0 4.1  CL 94* 97*  CO2 24 24  GLUCOSE 165* 160*  BUN 20 19  CREATININE 1.23* 1.18*  CALCIUM 9.0 8.5*     Studies/Results: No results found.  Anti-infectives: Anti-infectives (From admission, onward)   Start     Dose/Rate Route Frequency Ordered Stop   01/27/18 1845  Ampicillin-Sulbactam (UNASYN) 3 g in sodium chloride 0.9 % 100 mL IVPB     3 g 200 mL/hr over 30 Minutes Intravenous Every 6 hours 01/27/18 1832        Assessment/Plan: s/p Procedure(s): IRRIGATION AND DEBRIDEMENT AXILLARY ABSCESS Doing much better today with resolution of fever and leukocytosis.  Okay for discharge with home health to assist with wound care.   LOS: 2 days    Edward Jolly 01/30/2018

## 2018-01-30 NOTE — Discharge Instructions (Signed)
Call as needed for fever, increased redness or drainage, or other concerns.  Wound care: Repack open axillary incision with moist saline gauze daily.  Cover with 4 x 4 gauze and ABD.  May prior to dressing changes.

## 2018-01-30 NOTE — Progress Notes (Signed)
RN reviewed discharge instructions with patient and family. All questions answered.   Paperwork and prescriptions given.   NT rolled patient down with all belongings to family car. 

## 2018-01-30 NOTE — Progress Notes (Signed)
Spoke with patient at bedside, no preference for [facility] agency. Contacted [facility], referral for nurse for wound care. [facility] has accepted referral and will f/u with patient. [phone]

## 2018-01-30 NOTE — Discharge Summary (Signed)
Patient ID: Kiara Brown [id number] 77 y.o. May 23, 1941  01/27/2018  Discharge date and time: 01/30/2018   Admitting Physician: Edward Jolly  Discharge Physician: Edward Jolly  Admission Diagnoses: Post mastectomy abscess rt side  Discharge Diagnoses: Same  Operations: Procedure(s): IRRIGATION AND DEBRIDEMENT AXILLARY AND CHEST WALL ABSCESS  Admission Condition: fair  Discharged Condition: good  Indication for Admission: Patient is approximately 8 months following right total mastectomy and axillary sentinel lymph node biopsy for breast cancer.  She had been doing well but about 2 weeks ago developed some redness and swelling at the axilla.  This area was aspirated under ultrasound guidance with pus obtained and she was started on oral antibiotics.  Cultures subsequently grew strep.  She presented to our office on the day of admission with some fever and recurrent redness and swelling at the axilla.  Aspiration in the office revealed purulent material.  She was admitted for further treatment.  Hospital Course: Patient was admitted and started on IV Unasyn.  After discussing the procedure in detail she was taken to the operating room the following morning and underwent incision and drainage of the right axilla with findings of a good sized abscess that communicated to the chest wall beneath the mastectomy flaps.  This was drained and irrigated with packing in the axilla and a Penrose drain through a counterincision underneath her skin flaps.  She improved rapidly from this point.  White count decreased from 18,000 on admission to normal.  Fever resolved.  She felt generally much better on the day of discharge.  Disposition: Home  Patient Instructions:  Allergies as of 01/30/2018      Reactions   Duloxetine Other (See Comments)   UNSPECIFIED REACTION    Nsaids Rash   Oseltamivir Diarrhea, Other (See Comments)   Abdominal pain      Medication List    STOP taking  these medications   sulfamethoxazole-trimethoprim 800-160 MG tablet Commonly known as:  BACTRIM DS,SEPTRA DS     TAKE these medications   acetaminophen 500 MG tablet Commonly known as:  TYLENOL Take 500 mg by mouth every 6 (six) hours as needed for moderate pain or headache.   albuterol 108 (90 Base) MCG/ACT inhaler Commonly known as:  PROVENTIL HFA;VENTOLIN HFA Inhale 2 puffs into the lungs every 6 (six) hours as needed for wheezing or shortness of breath.   amoxicillin-clavulanate 875-125 MG tablet Commonly known as:  AUGMENTIN Take 1 tablet by mouth 2 (two) times daily for 10 days.   anastrozole 1 MG tablet Commonly known as:  ARIMIDEX Take 1 tablet (1 mg total) by mouth daily. What changed:  when to take this   aspirin EC 81 MG tablet Take 81 mg by mouth daily.   atenolol 50 MG tablet Commonly known as:  TENORMIN Take 50 mg by mouth daily before breakfast.   atorvastatin 80 MG tablet Commonly known as:  LIPITOR Take 1 tablet (80 mg total) by mouth daily.   buPROPion 150 MG 24 hr tablet Commonly known as:  WELLBUTRIN XL Take 150 mg by mouth daily before breakfast.   folic acid 1 MG tablet Commonly known as:  FOLVITE Take 1 mg by mouth daily.   furosemide 20 MG tablet Commonly known as:  LASIX Take 40 mg by mouth daily.   gabapentin 100 MG capsule Commonly known as:  NEURONTIN Take 100 mg by mouth Nightly.   isosorbide mononitrate 30 MG 24 hr tablet Commonly known as:  IMDUR Take 0.5  tablets (15 mg total) by mouth daily.   isosorbide mononitrate 30 MG 24 hr tablet Commonly known as:  IMDUR Take 30 mg by mouth daily.   lapatinib 250 MG tablet Commonly known as:  TYKERB Take 3 tablets (750 mg total) daily by mouth. Take on an empty stomach, 1 hour before or 2 hours after food.   omeprazole 40 MG capsule Commonly known as:  PRILOSEC Take 40 mg by mouth every morning.   ondansetron 8 MG tablet Commonly known as:  ZOFRAN Take 1 tablet (8 mg total) by  mouth every 8 (eight) hours as needed for nausea or vomiting.   oxyCODONE 5 MG immediate release tablet Commonly known as:  Oxy IR/ROXICODONE Take 1 tablet (5 mg total) by mouth every 6 (six) hours as needed for severe pain.   Potassium Chloride ER 20 MEQ Tbcr Take 20 mg by mouth daily at 2 PM.       Activity: activity as tolerated Diet: regular diet Wound Care: Daily dressing changes with moist saline gauze packing to axillary incision.  Follow-up:  With Christon Gallaway in 1 week.  Signed: Edward Jolly MD, FACS  01/30/2018, 1:41 PM

## 2018-01-31 DIAGNOSIS — E1122 Type 2 diabetes mellitus with diabetic chronic kidney disease: Secondary | ICD-10-CM | POA: Diagnosis not present

## 2018-01-31 DIAGNOSIS — L02411 Cutaneous abscess of right axilla: Secondary | ICD-10-CM | POA: Diagnosis not present

## 2018-01-31 DIAGNOSIS — Z9181 History of falling: Secondary | ICD-10-CM | POA: Diagnosis not present

## 2018-01-31 DIAGNOSIS — Z7982 Long term (current) use of aspirin: Secondary | ICD-10-CM | POA: Diagnosis not present

## 2018-01-31 DIAGNOSIS — I129 Hypertensive chronic kidney disease with stage 1 through stage 4 chronic kidney disease, or unspecified chronic kidney disease: Secondary | ICD-10-CM | POA: Diagnosis not present

## 2018-01-31 DIAGNOSIS — Z792 Long term (current) use of antibiotics: Secondary | ICD-10-CM | POA: Diagnosis not present

## 2018-01-31 DIAGNOSIS — B9689 Other specified bacterial agents as the cause of diseases classified elsewhere: Secondary | ICD-10-CM | POA: Diagnosis not present

## 2018-01-31 DIAGNOSIS — N182 Chronic kidney disease, stage 2 (mild): Secondary | ICD-10-CM | POA: Diagnosis not present

## 2018-01-31 DIAGNOSIS — B955 Unspecified streptococcus as the cause of diseases classified elsewhere: Secondary | ICD-10-CM | POA: Diagnosis not present

## 2018-01-31 DIAGNOSIS — M1710 Unilateral primary osteoarthritis, unspecified knee: Secondary | ICD-10-CM | POA: Diagnosis not present

## 2018-01-31 DIAGNOSIS — L02213 Cutaneous abscess of chest wall: Secondary | ICD-10-CM | POA: Diagnosis not present

## 2018-01-31 DIAGNOSIS — T8141XA Infection following a procedure, superficial incisional surgical site, initial encounter: Secondary | ICD-10-CM | POA: Diagnosis not present

## 2018-02-01 DIAGNOSIS — E1122 Type 2 diabetes mellitus with diabetic chronic kidney disease: Secondary | ICD-10-CM | POA: Diagnosis not present

## 2018-02-01 DIAGNOSIS — B955 Unspecified streptococcus as the cause of diseases classified elsewhere: Secondary | ICD-10-CM | POA: Diagnosis not present

## 2018-02-01 DIAGNOSIS — L02213 Cutaneous abscess of chest wall: Secondary | ICD-10-CM | POA: Diagnosis not present

## 2018-02-01 DIAGNOSIS — L02411 Cutaneous abscess of right axilla: Secondary | ICD-10-CM | POA: Diagnosis not present

## 2018-02-01 DIAGNOSIS — T8141XA Infection following a procedure, superficial incisional surgical site, initial encounter: Secondary | ICD-10-CM | POA: Diagnosis not present

## 2018-02-01 DIAGNOSIS — B9689 Other specified bacterial agents as the cause of diseases classified elsewhere: Secondary | ICD-10-CM | POA: Diagnosis not present

## 2018-02-02 DIAGNOSIS — L02411 Cutaneous abscess of right axilla: Secondary | ICD-10-CM | POA: Diagnosis not present

## 2018-02-02 DIAGNOSIS — B955 Unspecified streptococcus as the cause of diseases classified elsewhere: Secondary | ICD-10-CM | POA: Diagnosis not present

## 2018-02-02 DIAGNOSIS — T8141XA Infection following a procedure, superficial incisional surgical site, initial encounter: Secondary | ICD-10-CM | POA: Diagnosis not present

## 2018-02-02 DIAGNOSIS — L02213 Cutaneous abscess of chest wall: Secondary | ICD-10-CM | POA: Diagnosis not present

## 2018-02-02 DIAGNOSIS — B9689 Other specified bacterial agents as the cause of diseases classified elsewhere: Secondary | ICD-10-CM | POA: Diagnosis not present

## 2018-02-02 DIAGNOSIS — E1122 Type 2 diabetes mellitus with diabetic chronic kidney disease: Secondary | ICD-10-CM | POA: Diagnosis not present

## 2018-02-03 DIAGNOSIS — L02411 Cutaneous abscess of right axilla: Secondary | ICD-10-CM | POA: Diagnosis not present

## 2018-02-03 DIAGNOSIS — L02213 Cutaneous abscess of chest wall: Secondary | ICD-10-CM | POA: Diagnosis not present

## 2018-02-03 DIAGNOSIS — E1122 Type 2 diabetes mellitus with diabetic chronic kidney disease: Secondary | ICD-10-CM | POA: Diagnosis not present

## 2018-02-03 DIAGNOSIS — B955 Unspecified streptococcus as the cause of diseases classified elsewhere: Secondary | ICD-10-CM | POA: Diagnosis not present

## 2018-02-03 DIAGNOSIS — B9689 Other specified bacterial agents as the cause of diseases classified elsewhere: Secondary | ICD-10-CM | POA: Diagnosis not present

## 2018-02-03 DIAGNOSIS — T8141XA Infection following a procedure, superficial incisional surgical site, initial encounter: Secondary | ICD-10-CM | POA: Diagnosis not present

## 2018-02-04 DIAGNOSIS — B955 Unspecified streptococcus as the cause of diseases classified elsewhere: Secondary | ICD-10-CM | POA: Diagnosis not present

## 2018-02-04 DIAGNOSIS — E1122 Type 2 diabetes mellitus with diabetic chronic kidney disease: Secondary | ICD-10-CM | POA: Diagnosis not present

## 2018-02-04 DIAGNOSIS — B9689 Other specified bacterial agents as the cause of diseases classified elsewhere: Secondary | ICD-10-CM | POA: Diagnosis not present

## 2018-02-04 DIAGNOSIS — T8141XA Infection following a procedure, superficial incisional surgical site, initial encounter: Secondary | ICD-10-CM | POA: Diagnosis not present

## 2018-02-04 DIAGNOSIS — L02411 Cutaneous abscess of right axilla: Secondary | ICD-10-CM | POA: Diagnosis not present

## 2018-02-04 DIAGNOSIS — L02213 Cutaneous abscess of chest wall: Secondary | ICD-10-CM | POA: Diagnosis not present

## 2018-02-05 DIAGNOSIS — L02411 Cutaneous abscess of right axilla: Secondary | ICD-10-CM | POA: Diagnosis not present

## 2018-02-05 DIAGNOSIS — E1122 Type 2 diabetes mellitus with diabetic chronic kidney disease: Secondary | ICD-10-CM | POA: Diagnosis not present

## 2018-02-05 DIAGNOSIS — B955 Unspecified streptococcus as the cause of diseases classified elsewhere: Secondary | ICD-10-CM | POA: Diagnosis not present

## 2018-02-05 DIAGNOSIS — B9689 Other specified bacterial agents as the cause of diseases classified elsewhere: Secondary | ICD-10-CM | POA: Diagnosis not present

## 2018-02-05 DIAGNOSIS — L02213 Cutaneous abscess of chest wall: Secondary | ICD-10-CM | POA: Diagnosis not present

## 2018-02-05 DIAGNOSIS — T8141XA Infection following a procedure, superficial incisional surgical site, initial encounter: Secondary | ICD-10-CM | POA: Diagnosis not present

## 2018-02-06 DIAGNOSIS — L02411 Cutaneous abscess of right axilla: Secondary | ICD-10-CM | POA: Diagnosis not present

## 2018-02-06 DIAGNOSIS — L02213 Cutaneous abscess of chest wall: Secondary | ICD-10-CM | POA: Diagnosis not present

## 2018-02-06 DIAGNOSIS — B9689 Other specified bacterial agents as the cause of diseases classified elsewhere: Secondary | ICD-10-CM | POA: Diagnosis not present

## 2018-02-06 DIAGNOSIS — E1122 Type 2 diabetes mellitus with diabetic chronic kidney disease: Secondary | ICD-10-CM | POA: Diagnosis not present

## 2018-02-06 DIAGNOSIS — B955 Unspecified streptococcus as the cause of diseases classified elsewhere: Secondary | ICD-10-CM | POA: Diagnosis not present

## 2018-02-06 DIAGNOSIS — T8141XA Infection following a procedure, superficial incisional surgical site, initial encounter: Secondary | ICD-10-CM | POA: Diagnosis not present

## 2018-02-07 DIAGNOSIS — T8141XA Infection following a procedure, superficial incisional surgical site, initial encounter: Secondary | ICD-10-CM | POA: Diagnosis not present

## 2018-02-07 DIAGNOSIS — L02411 Cutaneous abscess of right axilla: Secondary | ICD-10-CM | POA: Diagnosis not present

## 2018-02-07 DIAGNOSIS — B9689 Other specified bacterial agents as the cause of diseases classified elsewhere: Secondary | ICD-10-CM | POA: Diagnosis not present

## 2018-02-07 DIAGNOSIS — E1122 Type 2 diabetes mellitus with diabetic chronic kidney disease: Secondary | ICD-10-CM | POA: Diagnosis not present

## 2018-02-07 DIAGNOSIS — B955 Unspecified streptococcus as the cause of diseases classified elsewhere: Secondary | ICD-10-CM | POA: Diagnosis not present

## 2018-02-07 DIAGNOSIS — L02213 Cutaneous abscess of chest wall: Secondary | ICD-10-CM | POA: Diagnosis not present

## 2018-02-08 DIAGNOSIS — L02411 Cutaneous abscess of right axilla: Secondary | ICD-10-CM | POA: Diagnosis not present

## 2018-02-08 DIAGNOSIS — T8141XA Infection following a procedure, superficial incisional surgical site, initial encounter: Secondary | ICD-10-CM | POA: Diagnosis not present

## 2018-02-08 DIAGNOSIS — B9689 Other specified bacterial agents as the cause of diseases classified elsewhere: Secondary | ICD-10-CM | POA: Diagnosis not present

## 2018-02-08 DIAGNOSIS — E1122 Type 2 diabetes mellitus with diabetic chronic kidney disease: Secondary | ICD-10-CM | POA: Diagnosis not present

## 2018-02-08 DIAGNOSIS — L02213 Cutaneous abscess of chest wall: Secondary | ICD-10-CM | POA: Diagnosis not present

## 2018-02-08 DIAGNOSIS — B955 Unspecified streptococcus as the cause of diseases classified elsewhere: Secondary | ICD-10-CM | POA: Diagnosis not present

## 2018-02-09 DIAGNOSIS — E1122 Type 2 diabetes mellitus with diabetic chronic kidney disease: Secondary | ICD-10-CM | POA: Diagnosis not present

## 2018-02-09 DIAGNOSIS — T8141XA Infection following a procedure, superficial incisional surgical site, initial encounter: Secondary | ICD-10-CM | POA: Diagnosis not present

## 2018-02-09 DIAGNOSIS — L02213 Cutaneous abscess of chest wall: Secondary | ICD-10-CM | POA: Diagnosis not present

## 2018-02-09 DIAGNOSIS — B9689 Other specified bacterial agents as the cause of diseases classified elsewhere: Secondary | ICD-10-CM | POA: Diagnosis not present

## 2018-02-09 DIAGNOSIS — B955 Unspecified streptococcus as the cause of diseases classified elsewhere: Secondary | ICD-10-CM | POA: Diagnosis not present

## 2018-02-09 DIAGNOSIS — L02411 Cutaneous abscess of right axilla: Secondary | ICD-10-CM | POA: Diagnosis not present

## 2018-02-10 DIAGNOSIS — B9689 Other specified bacterial agents as the cause of diseases classified elsewhere: Secondary | ICD-10-CM | POA: Diagnosis not present

## 2018-02-10 DIAGNOSIS — T8141XA Infection following a procedure, superficial incisional surgical site, initial encounter: Secondary | ICD-10-CM | POA: Diagnosis not present

## 2018-02-10 DIAGNOSIS — L02213 Cutaneous abscess of chest wall: Secondary | ICD-10-CM | POA: Diagnosis not present

## 2018-02-10 DIAGNOSIS — L02411 Cutaneous abscess of right axilla: Secondary | ICD-10-CM | POA: Diagnosis not present

## 2018-02-10 DIAGNOSIS — B955 Unspecified streptococcus as the cause of diseases classified elsewhere: Secondary | ICD-10-CM | POA: Diagnosis not present

## 2018-02-10 DIAGNOSIS — E1122 Type 2 diabetes mellitus with diabetic chronic kidney disease: Secondary | ICD-10-CM | POA: Diagnosis not present

## 2018-02-11 DIAGNOSIS — B955 Unspecified streptococcus as the cause of diseases classified elsewhere: Secondary | ICD-10-CM | POA: Diagnosis not present

## 2018-02-11 DIAGNOSIS — L02213 Cutaneous abscess of chest wall: Secondary | ICD-10-CM | POA: Diagnosis not present

## 2018-02-11 DIAGNOSIS — L02411 Cutaneous abscess of right axilla: Secondary | ICD-10-CM | POA: Diagnosis not present

## 2018-02-11 DIAGNOSIS — B9689 Other specified bacterial agents as the cause of diseases classified elsewhere: Secondary | ICD-10-CM | POA: Diagnosis not present

## 2018-02-11 DIAGNOSIS — E1122 Type 2 diabetes mellitus with diabetic chronic kidney disease: Secondary | ICD-10-CM | POA: Diagnosis not present

## 2018-02-11 DIAGNOSIS — T8141XA Infection following a procedure, superficial incisional surgical site, initial encounter: Secondary | ICD-10-CM | POA: Diagnosis not present

## 2018-02-13 DIAGNOSIS — B9689 Other specified bacterial agents as the cause of diseases classified elsewhere: Secondary | ICD-10-CM | POA: Diagnosis not present

## 2018-02-13 DIAGNOSIS — T8141XA Infection following a procedure, superficial incisional surgical site, initial encounter: Secondary | ICD-10-CM | POA: Diagnosis not present

## 2018-02-13 DIAGNOSIS — L02411 Cutaneous abscess of right axilla: Secondary | ICD-10-CM | POA: Diagnosis not present

## 2018-02-13 DIAGNOSIS — E1122 Type 2 diabetes mellitus with diabetic chronic kidney disease: Secondary | ICD-10-CM | POA: Diagnosis not present

## 2018-02-13 DIAGNOSIS — L02213 Cutaneous abscess of chest wall: Secondary | ICD-10-CM | POA: Diagnosis not present

## 2018-02-13 DIAGNOSIS — B955 Unspecified streptococcus as the cause of diseases classified elsewhere: Secondary | ICD-10-CM | POA: Diagnosis not present

## 2018-02-15 DIAGNOSIS — B9689 Other specified bacterial agents as the cause of diseases classified elsewhere: Secondary | ICD-10-CM | POA: Diagnosis not present

## 2018-02-15 DIAGNOSIS — L02213 Cutaneous abscess of chest wall: Secondary | ICD-10-CM | POA: Diagnosis not present

## 2018-02-15 DIAGNOSIS — B955 Unspecified streptococcus as the cause of diseases classified elsewhere: Secondary | ICD-10-CM | POA: Diagnosis not present

## 2018-02-15 DIAGNOSIS — E1122 Type 2 diabetes mellitus with diabetic chronic kidney disease: Secondary | ICD-10-CM | POA: Diagnosis not present

## 2018-02-15 DIAGNOSIS — L02411 Cutaneous abscess of right axilla: Secondary | ICD-10-CM | POA: Diagnosis not present

## 2018-02-15 DIAGNOSIS — T8141XA Infection following a procedure, superficial incisional surgical site, initial encounter: Secondary | ICD-10-CM | POA: Diagnosis not present

## 2018-02-16 DIAGNOSIS — T8141XA Infection following a procedure, superficial incisional surgical site, initial encounter: Secondary | ICD-10-CM | POA: Diagnosis not present

## 2018-02-16 DIAGNOSIS — B9689 Other specified bacterial agents as the cause of diseases classified elsewhere: Secondary | ICD-10-CM | POA: Diagnosis not present

## 2018-02-16 DIAGNOSIS — L02213 Cutaneous abscess of chest wall: Secondary | ICD-10-CM | POA: Diagnosis not present

## 2018-02-16 DIAGNOSIS — B955 Unspecified streptococcus as the cause of diseases classified elsewhere: Secondary | ICD-10-CM | POA: Diagnosis not present

## 2018-02-16 DIAGNOSIS — L02411 Cutaneous abscess of right axilla: Secondary | ICD-10-CM | POA: Diagnosis not present

## 2018-02-16 DIAGNOSIS — E1122 Type 2 diabetes mellitus with diabetic chronic kidney disease: Secondary | ICD-10-CM | POA: Diagnosis not present

## 2018-02-17 DIAGNOSIS — E1122 Type 2 diabetes mellitus with diabetic chronic kidney disease: Secondary | ICD-10-CM | POA: Diagnosis not present

## 2018-02-17 DIAGNOSIS — B9689 Other specified bacterial agents as the cause of diseases classified elsewhere: Secondary | ICD-10-CM | POA: Diagnosis not present

## 2018-02-17 DIAGNOSIS — L02213 Cutaneous abscess of chest wall: Secondary | ICD-10-CM | POA: Diagnosis not present

## 2018-02-17 DIAGNOSIS — L02411 Cutaneous abscess of right axilla: Secondary | ICD-10-CM | POA: Diagnosis not present

## 2018-02-17 DIAGNOSIS — T8141XA Infection following a procedure, superficial incisional surgical site, initial encounter: Secondary | ICD-10-CM | POA: Diagnosis not present

## 2018-02-17 DIAGNOSIS — B955 Unspecified streptococcus as the cause of diseases classified elsewhere: Secondary | ICD-10-CM | POA: Diagnosis not present

## 2018-02-19 DIAGNOSIS — L02213 Cutaneous abscess of chest wall: Secondary | ICD-10-CM | POA: Diagnosis not present

## 2018-02-19 DIAGNOSIS — L02411 Cutaneous abscess of right axilla: Secondary | ICD-10-CM | POA: Diagnosis not present

## 2018-02-19 DIAGNOSIS — B955 Unspecified streptococcus as the cause of diseases classified elsewhere: Secondary | ICD-10-CM | POA: Diagnosis not present

## 2018-02-19 DIAGNOSIS — T8141XA Infection following a procedure, superficial incisional surgical site, initial encounter: Secondary | ICD-10-CM | POA: Diagnosis not present

## 2018-02-19 DIAGNOSIS — B9689 Other specified bacterial agents as the cause of diseases classified elsewhere: Secondary | ICD-10-CM | POA: Diagnosis not present

## 2018-02-19 DIAGNOSIS — E1122 Type 2 diabetes mellitus with diabetic chronic kidney disease: Secondary | ICD-10-CM | POA: Diagnosis not present

## 2018-02-21 ENCOUNTER — Ambulatory Visit (HOSPITAL_COMMUNITY)
Admission: RE | Admit: 2018-02-21 | Discharge: 2018-02-21 | Disposition: A | Discharge status: Home / Self Care | Payer: Medicare Other | Source: Ambulatory Visit | Attending: Medical | Admitting: Medical

## 2018-02-21 ENCOUNTER — Inpatient Hospital Stay: Payer: Medicare Other

## 2018-02-21 ENCOUNTER — Inpatient Hospital Stay (HOSPITAL_BASED_OUTPATIENT_CLINIC_OR_DEPARTMENT_OTHER): Payer: Medicare Other | Admitting: Medical

## 2018-02-21 ENCOUNTER — Telehealth: Payer: Self-pay

## 2018-02-21 ENCOUNTER — Other Ambulatory Visit: Payer: Self-pay | Admitting: Medical

## 2018-02-21 VITALS — BP 133/64 | HR 81 | Temp 98.0°F | Resp 18 | Ht 68.0 in | Wt 245.8 lb

## 2018-02-21 DIAGNOSIS — C50211 Malignant neoplasm of upper-inner quadrant of right female breast: Secondary | ICD-10-CM

## 2018-02-21 DIAGNOSIS — Z79899 Other long term (current) drug therapy: Secondary | ICD-10-CM

## 2018-02-21 DIAGNOSIS — Z170 Estrogen receptor positive status [ER+]: Secondary | ICD-10-CM | POA: Diagnosis not present

## 2018-02-21 DIAGNOSIS — Y92008 Other place in unspecified non-institutional (private) residence as the place of occurrence of the external cause: Secondary | ICD-10-CM | POA: Diagnosis not present

## 2018-02-21 DIAGNOSIS — M797 Fibromyalgia: Secondary | ICD-10-CM

## 2018-02-21 DIAGNOSIS — W19XXXA Unspecified fall, initial encounter: Secondary | ICD-10-CM | POA: Diagnosis not present

## 2018-02-21 DIAGNOSIS — S300XXA Contusion of lower back and pelvis, initial encounter: Secondary | ICD-10-CM | POA: Diagnosis not present

## 2018-02-21 DIAGNOSIS — R0781 Pleurodynia: Secondary | ICD-10-CM | POA: Diagnosis not present

## 2018-02-21 DIAGNOSIS — W1809XA Striking against other object with subsequent fall, initial encounter: Secondary | ICD-10-CM

## 2018-02-21 DIAGNOSIS — M1990 Unspecified osteoarthritis, unspecified site: Secondary | ICD-10-CM

## 2018-02-21 DIAGNOSIS — Z803 Family history of malignant neoplasm of breast: Secondary | ICD-10-CM

## 2018-02-21 DIAGNOSIS — K219 Gastro-esophageal reflux disease without esophagitis: Secondary | ICD-10-CM

## 2018-02-21 DIAGNOSIS — T148XXA Other injury of unspecified body region, initial encounter: Secondary | ICD-10-CM

## 2018-02-21 DIAGNOSIS — L02411 Cutaneous abscess of right axilla: Secondary | ICD-10-CM | POA: Diagnosis not present

## 2018-02-21 DIAGNOSIS — R509 Fever, unspecified: Secondary | ICD-10-CM | POA: Diagnosis not present

## 2018-02-21 DIAGNOSIS — E785 Hyperlipidemia, unspecified: Secondary | ICD-10-CM

## 2018-02-21 DIAGNOSIS — Z8042 Family history of malignant neoplasm of prostate: Secondary | ICD-10-CM

## 2018-02-21 DIAGNOSIS — Z79811 Long term (current) use of aromatase inhibitors: Secondary | ICD-10-CM

## 2018-02-21 DIAGNOSIS — S299XXA Unspecified injury of thorax, initial encounter: Secondary | ICD-10-CM | POA: Diagnosis not present

## 2018-02-21 LAB — CBC WITH DIFFERENTIAL (CANCER CENTER ONLY)
BASOS ABS: 0 10*3/uL (ref 0.0–0.1)
Basophils Relative: 0 %
EOS ABS: 0.2 10*3/uL (ref 0.0–0.5)
EOS PCT: 2 %
HCT: 32.5 % — ABNORMAL LOW (ref 34.8–46.6)
Hemoglobin: 10 g/dL — ABNORMAL LOW (ref 11.6–15.9)
LYMPHS PCT: 27 %
Lymphs Abs: 2.1 10*3/uL (ref 0.9–3.3)
MCH: 25.6 pg (ref 25.1–34.0)
MCHC: 30.8 g/dL — ABNORMAL LOW (ref 31.5–36.0)
MCV: 83.1 fL (ref 79.5–101.0)
Monocytes Absolute: 0.5 10*3/uL (ref 0.1–0.9)
Monocytes Relative: 6 %
Neutro Abs: 5 10*3/uL (ref 1.5–6.5)
Neutrophils Relative %: 65 %
PLATELETS: 375 10*3/uL (ref 145–400)
RBC: 3.91 MIL/uL (ref 3.70–5.45)
RDW: 16.3 % — AB (ref 11.2–14.5)
WBC Count: 7.8 10*3/uL (ref 3.9–10.3)

## 2018-02-21 LAB — URINALYSIS, COMPLETE (UACMP) WITH MICROSCOPIC
BILIRUBIN URINE: NEGATIVE
Bacteria, UA: NONE SEEN
GLUCOSE, UA: NEGATIVE mg/dL
Ketones, ur: NEGATIVE mg/dL
LEUKOCYTES UA: NEGATIVE
Nitrite: NEGATIVE
PROTEIN: NEGATIVE mg/dL
Specific Gravity, Urine: 1.006 (ref 1.005–1.030)
Squamous Epithelial / LPF: NONE SEEN
pH: 6 (ref 5.0–8.0)

## 2018-02-21 LAB — BASIC METABOLIC PANEL - CANCER CENTER ONLY
ANION GAP: 10 (ref 3–11)
BUN: 16 mg/dL (ref 7–26)
CALCIUM: 9.3 mg/dL (ref 8.4–10.4)
CO2: 26 mmol/L (ref 22–29)
Chloride: 100 mmol/L (ref 98–109)
Creatinine: 1.04 mg/dL (ref 0.60–1.10)
GFR, EST NON AFRICAN AMERICAN: 51 mL/min — AB (ref >60)
GFR, Est AFR Am: 59 mL/min — ABNORMAL LOW (ref >60)
GLUCOSE: 192 mg/dL — AB (ref 70–140)
Potassium: 3.3 mmol/L — ABNORMAL LOW (ref 3.5–5.1)
Sodium: 136 mmol/L (ref 136–145)

## 2018-02-21 NOTE — Telephone Encounter (Signed)
Received VM from pt regarding she fell on Monday and her back has been hurting and would like to get it checked out.  Called pt and she reports she has been alternating between heat and cold for back and not getting better.  She reports pain is at right side of back, about a inch up from her waistline.  Ok per Lucianne Lei in symptom management to schedule pt for a visit today- pt can arrive within the hour.  Scheduled her appt for 11 a.  No other needs per pt at this time.

## 2018-02-22 DIAGNOSIS — B955 Unspecified streptococcus as the cause of diseases classified elsewhere: Secondary | ICD-10-CM | POA: Diagnosis not present

## 2018-02-22 DIAGNOSIS — T8141XA Infection following a procedure, superficial incisional surgical site, initial encounter: Secondary | ICD-10-CM | POA: Diagnosis not present

## 2018-02-22 DIAGNOSIS — E1122 Type 2 diabetes mellitus with diabetic chronic kidney disease: Secondary | ICD-10-CM | POA: Diagnosis not present

## 2018-02-22 DIAGNOSIS — B9689 Other specified bacterial agents as the cause of diseases classified elsewhere: Secondary | ICD-10-CM | POA: Diagnosis not present

## 2018-02-22 DIAGNOSIS — L02411 Cutaneous abscess of right axilla: Secondary | ICD-10-CM | POA: Diagnosis not present

## 2018-02-22 DIAGNOSIS — L02213 Cutaneous abscess of chest wall: Secondary | ICD-10-CM | POA: Diagnosis not present

## 2018-02-24 NOTE — Progress Notes (Signed)
 Symptoms Management Clinic Progress Note   Kiara Brown [id number] 07/25/1941 77 y.o.  Kiara Brown is managed by Dr. Magrinat  Actively treated with chemotherapy: no  Current Therapy: Anastrozole and lapatinib  Last Treated: 02/21/2018  Assessment: Plan:    Contusion of lower back, initial encounter - Plan: CBC with Differential (Cancer Center Only), Basic Metabolic Panel - Cancer Center Only, Urinalysis, Complete w Microscopic, CANCELED: DG Ribs Unilateral Right  Bruising - Plan: CBC with Differential (Cancer Center Only), Basic Metabolic Panel - Cancer Center Only, Urinalysis, Complete w Microscopic, CANCELED: DG Ribs Unilateral Right  Rib pain on right side - Plan: CANCELED: DG Ribs Unilateral Right   Contusion of the right lower back with bruising and right rib pain: Right rib x-rays were ordered in addition to a urinalysis, CBC, and comprehensive chemistry panel.  Please see After Visit Summary for patient specific instructions.  Future Appointments  Date Time Provider Department Center  04/02/2018  2:30 PM [facility] 6 [facility] None  04/02/2018  3:00 PM Magrinat, Gustav C, MD [facility] None    Orders Placed This Encounter  Procedures  . CBC with Differential (Cancer Center Only)  . Basic Metabolic Panel - Cancer Center Only  . Urinalysis, Complete w Microscopic       Subjective:   Patient ID:  Kiara Brown is a 77 y.o. (DOB 05/02/1941) female.  Chief Complaint:  Chief Complaint  Patient presents with  . 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  She continues to be followed by Dr. Magrinat and continues on anastrozole and lapatinib.  She presents to the office today after falling when getting up from her couch on Monday.  She fell backwards and hit her coffee table with her inferior  right back.  Pain is severe.  She took 2 Tylenol which did not help.  She is also taken tramadol helps a little.  The pain is worsened with deep breathing.  She denies hematuria or other evidence of active bleeding.  Medications: I have reviewed the patient's current medications.  Allergies:  Allergies  Allergen Reactions  . Duloxetine Other (See Comments)    UNSPECIFIED REACTION   . Nsaids Rash  . Oseltamivir Diarrhea and Other (See Comments)    Abdominal pain     Past Medical History:  Diagnosis Date  . Arthritis    osteo, rheumatoid  . Breast cancer (HCC)   . Diverticulosis   . Dysrhythmia    hx rapid heartbeat, no cardiologist  . Family history of breast cancer   . Family history of prostate cancer   . Fibromyalgia    peripheral neuropathy  . Fibromyalgia   . GERD (gastroesophageal reflux disease)   . H/O hiatal hernia   . Hyperlipidemia   . PONV (postoperative nausea and vomiting)    "THROAT WITH EXTREME BRUSING"1986, ulnar nerve surgery  . Sciatica   . Shortness of breath dyspnea    with exertion  . UTI (lower urinary tract infection)     Past Surgical History:  Procedure Laterality Date  . ABDOMINAL HYSTERECTOMY    . APPENDECTOMY    . back injection      July 2018- x1  . BREAST SURGERY Left 1986   biopsy, Recent 05/2017 at [facility]- had biopsy on R breast- malignant   .   CARDIAC CATHETERIZATION  2009   "no blockage per pt"  . CHOLECYSTECTOMY    . CYSTOSCOPY WITH BIOPSY    . INCONTINENCE SURGERY    . IRRIGATION AND DEBRIDEMENT ABSCESS Right 01/28/2018   Procedure: IRRIGATION AND DEBRIDEMENT AXILLARY ABSCESS;  Surgeon: Hoxworth, Benjamin, MD;  Location: [facility];  Service: General;  Laterality: Right;  . JOINT REPLACEMENT    . KNEE ARTHROSCOPY     bilateral  . KNEE ARTHROSCOPY Left 03/17/2014   Procedure: ARTHROSCOPY LEFT KNEE WITH SYNOVECTOMY;  Surgeon: Frank V Aluisio, MD;  Location: [facility];  Service: Orthopedics;  Laterality: Left;  . PORTACATH PLACEMENT Left  07/02/2017   Procedure: INSERTION PORT-A-CATH;  Surgeon: Hoxworth, Benjamin, MD;  Location: [facility];  Service: General;  Laterality: Left;  . RIGHT/LEFT HEART CATH AND CORONARY ANGIOGRAPHY N/A 08/30/2017   Procedure: RIGHT/LEFT HEART CATH AND CORONARY ANGIOGRAPHY;  Surgeon: McLean, Dalton S, MD;  Location: [facility];  Service: Cardiovascular;  Laterality: N/A;  . ROTATOR CUFF REPAIR     right  . SIMPLE MASTECTOMY WITH AXILLARY SENTINEL NODE BIOPSY Right 07/02/2017   Procedure: RIGHT TOTAL  MASTECTOMY WITH AXILLARY SENTINEL LYMPH  NODE BIOPSY;  Surgeon: Hoxworth, Benjamin, MD;  Location: [facility];  Service: General;  Laterality: Right;  . TOTAL KNEE ARTHROPLASTY  08/04/2012   Procedure: TOTAL KNEE ARTHROPLASTY;  Surgeon: Frank V Aluisio, MD;  Location: [facility];  Service: Orthopedics;  Laterality: Left;  . TOTAL KNEE ARTHROPLASTY Right 04/11/2015   Procedure: RIGHT TOTAL KNEE ARTHROPLASTY;  Surgeon: Frank Aluisio, MD;  Location: [facility];  Service: Orthopedics;  Laterality: Right;  . ulnar nerve surgery Right 1986    Family History  Problem Relation Age of Onset  . Hypertension Mother   . Stroke Mother   . Breast cancer Mother 70       died at 77  . Hypertension Father   . Heart attack Father        died at 68  . Hypertension Brother        MI  . Prostate cancer Brother 50  . Hypertension Brother        MI  . Lung cancer Brother        possible lung cancer, spot found on lung being monitored  . Hypertension Brother        MI  . Non-Hodgkin's lymphoma Sister 75  . Breast cancer Other 75       is currently 90  . Breast cancer Other 55  . Cancer Cousin 62       type of cancer unk.   . Cancer Other        type unknown, eye/face   . Cancer Other 65       died from cancer in his 60's, type unknown    Social History   Socioeconomic History  . Marital status: Married    Spouse name: Not on file  . Number of children: Not on file  . Years of education: Not on file  . Highest  education level: Not on file  Occupational History  . Not on file  Social Needs  . Financial resource strain: Not on file  . Food insecurity:    Worry: Not on file    Inability: Not on file  . Transportation needs:    Medical: Not on file    Non-medical: Not on file  Tobacco Use  . Smoking status: Never Smoker  . Smokeless tobacco: Never Used  Substance and Sexual   Activity  . Alcohol use: No  . Drug use: No  . Sexual activity: Not on file  Lifestyle  . Physical activity:    Days per week: Not on file    Minutes per session: Not on file  . Stress: Not on file  Relationships  . Social connections:    Talks on phone: Not on file    Gets together: Not on file    Attends religious service: Not on file    Active member of club or organization: Not on file    Attends meetings of clubs or organizations: Not on file    Relationship status: Not on file  . Intimate partner violence:    Fear of current or ex partner: Not on file    Emotionally abused: Not on file    Physically abused: Not on file    Forced sexual activity: Not on file  Other Topics Concern  . Not on file  Social History Narrative  . Not on file    Past Medical History, Surgical history, Social history, and Family history were reviewed and updated as appropriate.   Please see review of systems for further details on the patient's review from today.   Review of Systems:  Review of Systems  Constitutional: Negative for activity change and fatigue.  Respiratory: Negative for cough, choking, chest tightness, shortness of breath and wheezing.   Cardiovascular: Negative for chest pain, palpitations and leg swelling.  Gastrointestinal: Negative for abdominal pain, anal bleeding and blood in stool.  Genitourinary: Negative for hematuria.  Musculoskeletal: Positive for back pain.  Neurological: Negative for dizziness, syncope and light-headedness.  Hematological: Bruises/bleeds easily.    Objective:   Physical  Exam:  BP 133/64 (BP Location: Left Wrist, Patient Position: Sitting)   Pulse 81   Temp 98 F (36.7 C) (Oral)   Resp 18   Ht 5' 8" (1.727 m)   Wt 245 lb 12.8 oz (111.5 kg)   SpO2 100%   BMI 37.37 kg/m  ECOG: 0  Physical Exam  Constitutional: No distress.  HENT:  Head: Normocephalic and atraumatic.  Cardiovascular: Normal rate, regular rhythm and normal heart sounds. Exam reveals no gallop and no friction rub.  No murmur heard. Pulmonary/Chest: Effort normal and breath sounds normal. No respiratory distress. She has no wheezes. She has no rales.  Musculoskeletal: She exhibits tenderness. She exhibits no deformity.       Back:  Neurological: She is alert. Coordination normal.  Skin: Skin is warm and dry. She is not diaphoretic.  Psychiatric: She has a normal mood and affect. Her behavior is normal. Judgment and thought content normal.    Lab Review:     Component Value Date/Time   NA 136 02/21/2018 1139   NA 131 (L) 09/06/2017 1018   K 3.3 (L) 02/21/2018 1139   K 4.2 09/06/2017 1018   CL 100 02/21/2018 1139   CO2 26 02/21/2018 1139   CO2 25 09/06/2017 1018   GLUCOSE 192 (H) 02/21/2018 1139   GLUCOSE 187 (H) 09/06/2017 1018   BUN 16 02/21/2018 1139   BUN 11.2 09/06/2017 1018   CREATININE 1.04 02/21/2018 1139   CREATININE 1.2 (H) 09/06/2017 1018   CALCIUM 9.3 02/21/2018 1139   CALCIUM 9.3 09/06/2017 1018   PROT 7.2 01/13/2018 1221   PROT 6.9 09/06/2017 1018   ALBUMIN 3.4 (L) 01/13/2018 1221   ALBUMIN 3.4 (L) 09/06/2017 1018   AST 15 01/13/2018 1221   AST 20 09/06/2017 1018   ALT  11 01/13/2018 1221   ALT 14 09/06/2017 1018   ALKPHOS 110 01/13/2018 1221   ALKPHOS 103 09/06/2017 1018   BILITOT 0.5 01/13/2018 1221   BILITOT 0.57 09/06/2017 1018   GFRNONAA 51 (L) 02/21/2018 1139   GFRAA 59 (L) 02/21/2018 1139       Component Value Date/Time   WBC 7.8 02/21/2018 1139   WBC 9.1 01/30/2018 0537   RBC 3.91 02/21/2018 1139   HGB 8.0 (L) 01/30/2018 0537   HGB 10.1  (L) 09/06/2017 1018   HCT 32.5 (L) 02/21/2018 1139   HCT 30.9 (L) 09/06/2017 1018   PLT 375 02/21/2018 1139   PLT 328 09/06/2017 1018   MCV 83.1 02/21/2018 1139   MCV 81.5 09/06/2017 1018   MCH 25.6 02/21/2018 1139   MCHC 30.8 (L) 02/21/2018 1139   RDW 16.3 (H) 02/21/2018 1139   RDW 16.6 (H) 09/06/2017 1018   LYMPHSABS 2.1 02/21/2018 1139   LYMPHSABS 1.8 09/06/2017 1018   MONOABS 0.5 02/21/2018 1139   MONOABS 0.6 09/06/2017 1018   EOSABS 0.2 02/21/2018 1139   EOSABS 0.1 09/06/2017 1018   BASOSABS 0.0 02/21/2018 1139   BASOSABS 0.1 09/06/2017 1018   -------------------------------  Imaging from last 24 hours (if applicable):  Radiology interpretation: Dg Ribs Unilateral W/chest Right  Result Date: 02/21/2018 CLINICAL DATA:  RIGHT lower back pain and bruising, fall 4 days ago EXAM: RIGHT RIBS AND CHEST - 3+ VIEW COMPARISON:  Chest radiograph 08/10/2017, CT chest 08/11/2017 FINDINGS: Normal heart size, mediastinal contours, and pulmonary vascularity. Atherosclerotic calcification aorta. Bronchitic changes without infiltrate, pleural effusion or pneumothorax. Osseous mineralization normal. No rib fracture or bone destruction. IMPRESSION: Bronchitic changes without infiltrate. No acute RIGHT rib abnormalities. Electronically Signed   By: Lavonia Dana M.D.   On: 02/21/2018 12:29

## 2018-02-25 DIAGNOSIS — L02213 Cutaneous abscess of chest wall: Secondary | ICD-10-CM | POA: Diagnosis not present

## 2018-02-25 DIAGNOSIS — E1122 Type 2 diabetes mellitus with diabetic chronic kidney disease: Secondary | ICD-10-CM | POA: Diagnosis not present

## 2018-02-25 DIAGNOSIS — T8141XA Infection following a procedure, superficial incisional surgical site, initial encounter: Secondary | ICD-10-CM | POA: Diagnosis not present

## 2018-02-25 DIAGNOSIS — L02411 Cutaneous abscess of right axilla: Secondary | ICD-10-CM | POA: Diagnosis not present

## 2018-02-25 DIAGNOSIS — B9689 Other specified bacterial agents as the cause of diseases classified elsewhere: Secondary | ICD-10-CM | POA: Diagnosis not present

## 2018-02-25 DIAGNOSIS — B955 Unspecified streptococcus as the cause of diseases classified elsewhere: Secondary | ICD-10-CM | POA: Diagnosis not present

## 2018-02-26 ENCOUNTER — Other Ambulatory Visit: Payer: Self-pay | Admitting: Pharmacist

## 2018-02-26 DIAGNOSIS — C50211 Malignant neoplasm of upper-inner quadrant of right female breast: Secondary | ICD-10-CM

## 2018-02-26 DIAGNOSIS — Z170 Estrogen receptor positive status [ER+]: Principal | ICD-10-CM

## 2018-02-26 MED ORDER — LAPATINIB DITOSYLATE 250 MG PO TABS: 750.0000 mg | ORAL_TABLET | Freq: Every day | ORAL | 6 refills | Status: DC

## 2018-02-26 NOTE — Progress Notes (Signed)
Oral Oncology Pharmacist Encounter  Received call from patient with notification that she is having trouble getting her next fill Tykerb.  Patient receives her Tykerb at $0 out-of-pocket cost through [employer] assistance program. Melodye Ped is shipped directly to the patient's home from the [place].  Per pharmacy, there are no more refills on patient's current prescription and they need a new prescription sent from the office.  Tykerb prescription E scribed to [employer]. Per last MD note, patient tolerating Tykerb exceptionally well.   Patient knows to call the office with any additional questions or concerns.  Office visit in May confirmed with patient.  Oral Oncology Clinic will continue to follow.  Johny Drilling, PharmD, BCPS, BCOP 02/26/2018 11:05 AM Oral Oncology Clinic [phone]

## 2018-02-28 DIAGNOSIS — L02411 Cutaneous abscess of right axilla: Secondary | ICD-10-CM | POA: Diagnosis not present

## 2018-02-28 DIAGNOSIS — B9689 Other specified bacterial agents as the cause of diseases classified elsewhere: Secondary | ICD-10-CM | POA: Diagnosis not present

## 2018-02-28 DIAGNOSIS — T8141XA Infection following a procedure, superficial incisional surgical site, initial encounter: Secondary | ICD-10-CM | POA: Diagnosis not present

## 2018-02-28 DIAGNOSIS — L02213 Cutaneous abscess of chest wall: Secondary | ICD-10-CM | POA: Diagnosis not present

## 2018-02-28 DIAGNOSIS — B955 Unspecified streptococcus as the cause of diseases classified elsewhere: Secondary | ICD-10-CM | POA: Diagnosis not present

## 2018-02-28 DIAGNOSIS — E1122 Type 2 diabetes mellitus with diabetic chronic kidney disease: Secondary | ICD-10-CM | POA: Diagnosis not present

## 2018-03-03 DIAGNOSIS — L02411 Cutaneous abscess of right axilla: Secondary | ICD-10-CM | POA: Diagnosis not present

## 2018-03-03 DIAGNOSIS — B9689 Other specified bacterial agents as the cause of diseases classified elsewhere: Secondary | ICD-10-CM | POA: Diagnosis not present

## 2018-03-03 DIAGNOSIS — T8141XA Infection following a procedure, superficial incisional surgical site, initial encounter: Secondary | ICD-10-CM | POA: Diagnosis not present

## 2018-03-03 DIAGNOSIS — E1122 Type 2 diabetes mellitus with diabetic chronic kidney disease: Secondary | ICD-10-CM | POA: Diagnosis not present

## 2018-03-03 DIAGNOSIS — L02213 Cutaneous abscess of chest wall: Secondary | ICD-10-CM | POA: Diagnosis not present

## 2018-03-03 DIAGNOSIS — B955 Unspecified streptococcus as the cause of diseases classified elsewhere: Secondary | ICD-10-CM | POA: Diagnosis not present

## 2018-03-06 DIAGNOSIS — B955 Unspecified streptococcus as the cause of diseases classified elsewhere: Secondary | ICD-10-CM | POA: Diagnosis not present

## 2018-03-06 DIAGNOSIS — T8141XA Infection following a procedure, superficial incisional surgical site, initial encounter: Secondary | ICD-10-CM | POA: Diagnosis not present

## 2018-03-06 DIAGNOSIS — L02213 Cutaneous abscess of chest wall: Secondary | ICD-10-CM | POA: Diagnosis not present

## 2018-03-06 DIAGNOSIS — B9689 Other specified bacterial agents as the cause of diseases classified elsewhere: Secondary | ICD-10-CM | POA: Diagnosis not present

## 2018-03-06 DIAGNOSIS — L02411 Cutaneous abscess of right axilla: Secondary | ICD-10-CM | POA: Diagnosis not present

## 2018-03-06 DIAGNOSIS — E1122 Type 2 diabetes mellitus with diabetic chronic kidney disease: Secondary | ICD-10-CM | POA: Diagnosis not present

## 2018-03-13 DIAGNOSIS — B955 Unspecified streptococcus as the cause of diseases classified elsewhere: Secondary | ICD-10-CM | POA: Diagnosis not present

## 2018-03-13 DIAGNOSIS — L02411 Cutaneous abscess of right axilla: Secondary | ICD-10-CM | POA: Diagnosis not present

## 2018-03-13 DIAGNOSIS — L02213 Cutaneous abscess of chest wall: Secondary | ICD-10-CM | POA: Diagnosis not present

## 2018-03-13 DIAGNOSIS — B9689 Other specified bacterial agents as the cause of diseases classified elsewhere: Secondary | ICD-10-CM | POA: Diagnosis not present

## 2018-03-13 DIAGNOSIS — E1122 Type 2 diabetes mellitus with diabetic chronic kidney disease: Secondary | ICD-10-CM | POA: Diagnosis not present

## 2018-03-13 DIAGNOSIS — T8141XA Infection following a procedure, superficial incisional surgical site, initial encounter: Secondary | ICD-10-CM | POA: Diagnosis not present

## 2018-03-18 DIAGNOSIS — L02213 Cutaneous abscess of chest wall: Secondary | ICD-10-CM | POA: Diagnosis not present

## 2018-03-18 DIAGNOSIS — L02411 Cutaneous abscess of right axilla: Secondary | ICD-10-CM | POA: Diagnosis not present

## 2018-03-18 DIAGNOSIS — B955 Unspecified streptococcus as the cause of diseases classified elsewhere: Secondary | ICD-10-CM | POA: Diagnosis not present

## 2018-03-18 DIAGNOSIS — T8141XA Infection following a procedure, superficial incisional surgical site, initial encounter: Secondary | ICD-10-CM | POA: Diagnosis not present

## 2018-03-18 DIAGNOSIS — E1122 Type 2 diabetes mellitus with diabetic chronic kidney disease: Secondary | ICD-10-CM | POA: Diagnosis not present

## 2018-03-18 DIAGNOSIS — B9689 Other specified bacterial agents as the cause of diseases classified elsewhere: Secondary | ICD-10-CM | POA: Diagnosis not present

## 2018-03-26 ENCOUNTER — Telehealth: Payer: Self-pay

## 2018-03-26 ENCOUNTER — Ambulatory Visit (HOSPITAL_COMMUNITY)
Admission: RE | Admit: 2018-03-26 | Discharge: 2018-03-26 | Disposition: A | Discharge status: Home / Self Care | Payer: Medicare Other | Source: Ambulatory Visit | Attending: Medical | Admitting: Medical

## 2018-03-26 ENCOUNTER — Other Ambulatory Visit: Payer: Self-pay | Admitting: Medical

## 2018-03-26 ENCOUNTER — Inpatient Hospital Stay: Payer: Medicare Other | Attending: Oncology | Admitting: Medical

## 2018-03-26 VITALS — BP 145/59 | HR 74 | Temp 98.3°F | Resp 16

## 2018-03-26 DIAGNOSIS — Z801 Family history of malignant neoplasm of trachea, bronchus and lung: Secondary | ICD-10-CM | POA: Insufficient documentation

## 2018-03-26 DIAGNOSIS — Z8042 Family history of malignant neoplasm of prostate: Secondary | ICD-10-CM | POA: Diagnosis not present

## 2018-03-26 DIAGNOSIS — C50211 Malignant neoplasm of upper-inner quadrant of right female breast: Secondary | ICD-10-CM | POA: Diagnosis not present

## 2018-03-26 DIAGNOSIS — K219 Gastro-esophageal reflux disease without esophagitis: Secondary | ICD-10-CM | POA: Diagnosis not present

## 2018-03-26 DIAGNOSIS — Z79899 Other long term (current) drug therapy: Secondary | ICD-10-CM | POA: Diagnosis not present

## 2018-03-26 DIAGNOSIS — M797 Fibromyalgia: Secondary | ICD-10-CM

## 2018-03-26 DIAGNOSIS — Z170 Estrogen receptor positive status [ER+]: Secondary | ICD-10-CM | POA: Diagnosis not present

## 2018-03-26 DIAGNOSIS — M25571 Pain in right ankle and joints of right foot: Secondary | ICD-10-CM | POA: Insufficient documentation

## 2018-03-26 DIAGNOSIS — M899 Disorder of bone, unspecified: Secondary | ICD-10-CM | POA: Diagnosis not present

## 2018-03-26 DIAGNOSIS — M1990 Unspecified osteoarthritis, unspecified site: Secondary | ICD-10-CM

## 2018-03-26 DIAGNOSIS — Z803 Family history of malignant neoplasm of breast: Secondary | ICD-10-CM | POA: Insufficient documentation

## 2018-03-26 MED ORDER — HYDROCODONE-ACETAMINOPHEN 5-325 MG PO TABS: 1.0000 | ORAL_TABLET | ORAL | 0 refills | Status: DC | PRN

## 2018-03-26 NOTE — Progress Notes (Signed)
RN was not able to assess pt, seen by PA Lucianne Lei only for this visit.  PA Lucianne Lei aware.

## 2018-03-26 NOTE — Patient Instructions (Signed)
Implanted Port Home Guide An implanted port is a type of central line that is placed under the skin. Central lines are used to provide IV access when treatment or nutrition needs to be given through a person's veins. Implanted ports are used for long-term IV access. An implanted port may be placed because:  You need IV medicine that would be irritating to the small veins in your hands or arms.  You need long-term IV medicines, such as antibiotics.  You need IV nutrition for a long period.  You need frequent blood draws for lab tests.  You need dialysis.  Implanted ports are usually placed in the chest area, but they can also be placed in the upper arm, the abdomen, or the leg. An implanted port has two main parts:  Reservoir. The reservoir is round and will appear as a small, raised area under your skin. The reservoir is the part where a needle is inserted to give medicines or draw blood.  Catheter. The catheter is a thin, flexible tube that extends from the reservoir. The catheter is placed into a large vein. Medicine that is inserted into the reservoir goes into the catheter and then into the vein.  How will I care for my incision site? Do not get the incision site wet. Bathe or shower as directed by your health care provider. How is my port accessed? Special steps must be taken to access the port:  Before the port is accessed, a numbing cream can be placed on the skin. This helps numb the skin over the port site.  Your health care provider uses a sterile technique to access the port. ? Your health care provider must put on a mask and sterile gloves. ? The skin over your port is cleaned carefully with an antiseptic and allowed to dry. ? The port is gently pinched between sterile gloves, and a needle is inserted into the port.  Only "non-coring" port needles should be used to access the port. Once the port is accessed, a blood return should be checked. This helps ensure that the port  is in the vein and is not clogged.  If your port needs to remain accessed for a constant infusion, a clear (transparent) bandage will be placed over the needle site. The bandage and needle will need to be changed every week, or as directed by your health care provider.  Keep the bandage covering the needle clean and dry. Do not get it wet. Follow your health care provider's instructions on how to take a shower or bath while the port is accessed.  If your port does not need to stay accessed, no bandage is needed over the port.  What is flushing? Flushing helps keep the port from getting clogged. Follow your health care provider's instructions on how and when to flush the port. Ports are usually flushed with saline solution or a medicine called heparin. The need for flushing will depend on how the port is used.  If the port is used for intermittent medicines or blood draws, the port will need to be flushed: ? After medicines have been given. ? After blood has been drawn. ? As part of routine maintenance.  If a constant infusion is running, the port may not need to be flushed.  How long will my port stay implanted? The port can stay in for as long as your health care provider thinks it is needed. When it is time for the port to come out, surgery will be   done to remove it. The procedure is similar to the one performed when the port was put in. When should I seek immediate medical care? When you have an implanted port, you should seek immediate medical care if:  You notice a bad smell coming from the incision site.  You have swelling, redness, or drainage at the incision site.  You have more swelling or pain at the port site or the surrounding area.  You have a fever that is not controlled with medicine.  This information is not intended to replace advice given to you by your health care provider. Make sure you discuss any questions you have with your health care provider. Document  Released: 11/12/2005 Document Revised: 04/19/2016 Document Reviewed: 07/20/2013 Elsevier Interactive Patient Education  2017 [employer]  

## 2018-03-26 NOTE — Telephone Encounter (Signed)
Patient called reporting pain of right ankle. Patient states pain has been ongoing "for a while"; however, "I heard something pop in the middle of the night". Patient rates pain 8 out of 10. Denies fever or chills. Reports difficulty bearing weight on right ankle, warm to touch, red, and swollen. Patient states she took tylenol around 12pm and states "If I can't be seen, I'll just go to the emergency room".   RN consulted with Sandi Mealy, PA. Appointments scheduled.   Patient advised to be seen in Radiology and report to [facility] after imaging. Patient verbalized understanding.

## 2018-03-27 DIAGNOSIS — M5136 Other intervertebral disc degeneration, lumbar region: Secondary | ICD-10-CM | POA: Diagnosis not present

## 2018-03-27 DIAGNOSIS — M48062 Spinal stenosis, lumbar region with neurogenic claudication: Secondary | ICD-10-CM | POA: Diagnosis not present

## 2018-03-27 DIAGNOSIS — M5416 Radiculopathy, lumbar region: Secondary | ICD-10-CM | POA: Diagnosis not present

## 2018-03-28 DIAGNOSIS — B955 Unspecified streptococcus as the cause of diseases classified elsewhere: Secondary | ICD-10-CM | POA: Diagnosis not present

## 2018-03-28 DIAGNOSIS — T8141XA Infection following a procedure, superficial incisional surgical site, initial encounter: Secondary | ICD-10-CM | POA: Diagnosis not present

## 2018-03-28 DIAGNOSIS — B9689 Other specified bacterial agents as the cause of diseases classified elsewhere: Secondary | ICD-10-CM | POA: Diagnosis not present

## 2018-03-28 DIAGNOSIS — L02411 Cutaneous abscess of right axilla: Secondary | ICD-10-CM | POA: Diagnosis not present

## 2018-03-28 DIAGNOSIS — L02213 Cutaneous abscess of chest wall: Secondary | ICD-10-CM | POA: Diagnosis not present

## 2018-03-28 DIAGNOSIS — E1122 Type 2 diabetes mellitus with diabetic chronic kidney disease: Secondary | ICD-10-CM | POA: Diagnosis not present

## 2018-03-28 NOTE — Progress Notes (Signed)
[facility] Progress Note   CYAN CLIPPINGER [id number] [birth year] 77 y.o.  Kiara Brown is managed by  Dr. Jana Hakim  Actively treated with chemotherapy: no  Current Therapy: Anastrozole and lapatinib  Last Treated: 03/26/2018  Assessment: Plan:    Acute right ankle pain - Plan: HYDROcodone-acetaminophen (NORCO) 5-325 MG tablet   Acute right ankle pain: An x-ray of the right ankle was ordered today with results returning showing: 1) No acute fracture or dislocation identified. 2. Small well corticated bony density adjacent to medial malleolus, probably sequelae of old fracture. 3. Plantar calcaneal enthesophyte.  Ms. Marmol was given information for an orthopedic urgent care where she will proceed to now.  She was also given a prescription for Vicodin to use as needed for her right ankle pain.  Please see After Visit Summary for patient specific instructions.  Future Appointments  Date Time Provider [place]  04/02/2018  2:30 PM [facility] 6 [facility] None  04/02/2018  3:00 PM Magrinat, Virgie Dad, MD [facility] None  06/02/2018  9:00 AM [facility]  06/02/2018 10:40 AM Larey Dresser, MD [facility] None    No orders of the defined types were placed in this encounter.      Subjective:   Patient ID:  Kiara Brown is a 77 y.o. (DOB [birth year]) female.  Chief Complaint: No chief complaint on file.   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  She continues to be followed by Dr. Jana Hakim.  She is being treated with anastrozole and lapatinib.  She presents to the office today with a history of bilateral ankle swelling and pain.  She reports walking yesterday with felt acute pain and heard a pop of her right ankle.  She is having significant pain and is unable to  bear weight.  An x-ray of her ankle was ordered  today with results showing: 1) No acute fracture or dislocation identified. 2. Small well corticated bony density adjacent to medial malleolus, probably sequelae of old fracture. 3. Plantar calcaneal enthesophyte. 1) No acute fracture or dislocation identified. 2. Small well corticated bony density adjacent to medial malleolus, probably sequelae of old fracture. 3. Plantar calcaneal enthesophyte.  She is agreeable to go to orthopedic urgent care this afternoon.  Medications: I have reviewed the patient's current medications.  Allergies:  Allergies  Allergen Reactions  . Duloxetine Other (See Comments)    UNSPECIFIED REACTION   . Nsaids Rash  . Oseltamivir Diarrhea and Other (See Comments)    Abdominal pain     Past Medical History:  Diagnosis Date  . Arthritis    osteo, rheumatoid  . Breast cancer ([place])   . Diverticulosis   . Dysrhythmia    hx rapid heartbeat, no cardiologist  . Family history of breast cancer   . Family history of prostate cancer   . Fibromyalgia    peripheral neuropathy  . Fibromyalgia   . GERD (gastroesophageal reflux disease)   . H/O hiatal hernia   . Hyperlipidemia   . PONV (postoperative nausea and vomiting)    "THROAT WITH EXTREME CHYIFOY"7741, ulnar nerve surgery  . Sciatica   . Shortness of breath dyspnea    with exertion  . UTI (lower urinary tract infection)     Past Surgical History:  Procedure Laterality Date  . ABDOMINAL HYSTERECTOMY    .  APPENDECTOMY    . back injection      July 2018- x1  . BREAST SURGERY Left 1986   biopsy, Recent 05/2017 at [facility]- had biopsy on R breast- malignant   . CARDIAC CATHETERIZATION  2009   "no blockage per pt"  . CHOLECYSTECTOMY    . CYSTOSCOPY WITH BIOPSY    . INCONTINENCE SURGERY    . IRRIGATION AND DEBRIDEMENT ABSCESS Right 01/28/2018   Procedure: IRRIGATION AND DEBRIDEMENT AXILLARY ABSCESS;  Surgeon: Excell Seltzer, MD;  Location: [facility];  Service: General;  Laterality: Right;  . JOINT REPLACEMENT     . KNEE ARTHROSCOPY     bilateral  . KNEE ARTHROSCOPY Left 03/17/2014   Procedure: ARTHROSCOPY LEFT KNEE WITH SYNOVECTOMY;  Surgeon: Gearlean Alf, MD;  Location: [facility];  Service: Orthopedics;  Laterality: Left;  . PORTACATH PLACEMENT Left 07/02/2017   Procedure: INSERTION PORT-A-CATH;  Surgeon: Excell Seltzer, MD;  Location: [place];  Service: General;  Laterality: Left;  . RIGHT/LEFT HEART CATH AND CORONARY ANGIOGRAPHY N/A 08/30/2017   Procedure: RIGHT/LEFT HEART CATH AND CORONARY ANGIOGRAPHY;  Surgeon: Larey Dresser, MD;  Location: [place] [facility];  Service: Cardiovascular;  Laterality: N/A;  . ROTATOR CUFF REPAIR     right  . SIMPLE MASTECTOMY WITH AXILLARY SENTINEL NODE BIOPSY Right 07/02/2017   Procedure: RIGHT TOTAL  MASTECTOMY WITH AXILLARY SENTINEL LYMPH  NODE BIOPSY;  Surgeon: Excell Seltzer, MD;  Location: [place];  Service: General;  Laterality: Right;  . TOTAL KNEE ARTHROPLASTY  08/04/2012   Procedure: TOTAL KNEE ARTHROPLASTY;  Surgeon: Gearlean Alf, MD;  Location: [facility];  Service: Orthopedics;  Laterality: Left;  . TOTAL KNEE ARTHROPLASTY Right 04/11/2015   Procedure: RIGHT TOTAL KNEE ARTHROPLASTY;  Surgeon: Gaynelle Arabian, MD;  Location: [facility];  Service: Orthopedics;  Laterality: Right;  . ulnar nerve surgery Right 1986    Family History  Problem Relation Age of Onset  . Hypertension Mother   . Stroke Mother   . Breast cancer Mother 67       died at 101  . Hypertension Father   . Heart attack Father        died at 73  . Hypertension Brother        MI  . Prostate cancer Brother 32  . Hypertension Brother        MI  . Lung cancer Brother        possible lung cancer, spot found on lung being monitored  . Hypertension Brother        MI  . Non-Hodgkin's lymphoma Sister 58  . Breast cancer Other 62       is currently 90  . Breast cancer Other 55  . Cancer Cousin 62       type of cancer unk.   . Cancer Other        type unknown, eye/face   . Cancer Other  95       died from cancer in his 40's, type unknown    Social History   Socioeconomic History  . Marital status: Married    Spouse name: Not on file  . Number of children: Not on file  . Years of education: Not on file  . Highest education level: Not on file  Occupational History  . Not on file  Social Needs  . Financial resource strain: Not on file  . Food insecurity:    Worry: Not on file    Inability: Not on file  .  Transportation needs:    Medical: Not on file    Non-medical: Not on file  Tobacco Use  . Smoking status: Never Smoker  . Smokeless tobacco: Never Used  Substance and Sexual Activity  . Alcohol use: No  . Drug use: No  . Sexual activity: Not on file  Lifestyle  . Physical activity:    Days per week: Not on file    Minutes per session: Not on file  . Stress: Not on file  Relationships  . Social connections:    Talks on phone: Not on file    Gets together: Not on file    Attends religious service: Not on file    Active member of club or organization: Not on file    Attends meetings of clubs or organizations: Not on file    Relationship status: Not on file  . Intimate partner violence:    Fear of current or ex partner: Not on file    Emotionally abused: Not on file    Physically abused: Not on file    Forced sexual activity: Not on file  Other Topics Concern  . Not on file  Social History Narrative  . Not on file    Past Medical History, Surgical history, Social history, and Family history were reviewed and updated as appropriate.   Please see review of systems for further details on the patient's review from today.   Review of Systems:  Review of Systems  Cardiovascular: Positive for leg swelling.  Musculoskeletal: Positive for arthralgias, gait problem and joint swelling.    Objective:   Physical Exam:  BP (!) 145/59 (BP Location: Left Wrist, Patient Position: Sitting)   Pulse 74   Temp 98.3 F (36.8 C) (Oral)   Resp 16   SpO2 100%    ECOG: 0  Physical Exam  Constitutional: No distress.  HENT:  Head: Normocephalic and atraumatic.  Musculoskeletal: She exhibits edema and tenderness.       Feet:  Skin: Skin is warm and dry. She is not diaphoretic.    Lab Review:     Component Value Date/Time   NA 136 02/21/2018 1139   NA 131 (L) 09/06/2017 1018   K 3.3 (L) 02/21/2018 1139   K 4.2 09/06/2017 1018   CL 100 02/21/2018 1139   CO2 26 02/21/2018 1139   CO2 25 09/06/2017 1018   GLUCOSE 192 (H) 02/21/2018 1139   GLUCOSE 187 (H) 09/06/2017 1018   BUN 16 02/21/2018 1139   BUN 11.2 09/06/2017 1018   CREATININE 1.04 02/21/2018 1139   CREATININE 1.2 (H) 09/06/2017 1018   CALCIUM 9.3 02/21/2018 1139   CALCIUM 9.3 09/06/2017 1018   PROT 7.2 01/13/2018 1221   PROT 6.9 09/06/2017 1018   ALBUMIN 3.4 (L) 01/13/2018 1221   ALBUMIN 3.4 (L) 09/06/2017 1018   AST 15 01/13/2018 1221   AST 20 09/06/2017 1018   ALT 11 01/13/2018 1221   ALT 14 09/06/2017 1018   ALKPHOS 110 01/13/2018 1221   ALKPHOS 103 09/06/2017 1018   BILITOT 0.5 01/13/2018 1221   BILITOT 0.57 09/06/2017 1018   GFRNONAA 51 (L) 02/21/2018 1139   GFRAA 59 (L) 02/21/2018 1139       Component Value Date/Time   WBC 7.8 02/21/2018 1139   WBC 9.1 01/30/2018 0537   RBC 3.91 02/21/2018 1139   HGB 10.0 (L) 02/21/2018 1139   HGB 10.1 (L) 09/06/2017 1018   HCT 32.5 (L) 02/21/2018 1139   HCT 30.9 (L) 09/06/2017 1018  PLT 375 02/21/2018 1139   PLT 328 09/06/2017 1018   MCV 83.1 02/21/2018 1139   MCV 81.5 09/06/2017 1018   MCH 25.6 02/21/2018 1139   MCHC 30.8 (L) 02/21/2018 1139   RDW 16.3 (H) 02/21/2018 1139   RDW 16.6 (H) 09/06/2017 1018   LYMPHSABS 2.1 02/21/2018 1139   LYMPHSABS 1.8 09/06/2017 1018   MONOABS 0.5 02/21/2018 1139   MONOABS 0.6 09/06/2017 1018   EOSABS 0.2 02/21/2018 1139   EOSABS 0.1 09/06/2017 1018   BASOSABS 0.0 02/21/2018 1139   BASOSABS 0.1 09/06/2017 1018   -------------------------------  Imaging from last 24 hours (if  applicable):  Radiology interpretation: Dg Ankle 2 Views Right  Result Date: 03/26/2018 CLINICAL DATA:  77 y/o F; 2 days of ankle pain. Heard a "pop" while walking. EXAM: RIGHT ANKLE - 2 VIEW COMPARISON:  None. FINDINGS: There is no evidence of fracture, dislocation, or joint effusion. Small well corticated bony density adjacent to medial malleolus probably representing sequelae of old fracture. Plantar calcaneal enthesophyte. IMPRESSION: 1. No acute fracture or dislocation identified. 2. Small well corticated bony density adjacent to medial malleolus, probably sequelae of old fracture. 3. Plantar calcaneal enthesophyte. Electronically Signed   By: Kristine Garbe M.D.   On: 03/26/2018 16:20

## 2018-04-02 ENCOUNTER — Telehealth: Payer: Self-pay | Admitting: Oncology

## 2018-04-02 ENCOUNTER — Inpatient Hospital Stay: Payer: Medicare Other

## 2018-04-02 ENCOUNTER — Inpatient Hospital Stay (HOSPITAL_BASED_OUTPATIENT_CLINIC_OR_DEPARTMENT_OTHER): Payer: Medicare Other | Admitting: Oncology

## 2018-04-02 VITALS — BP 127/43 | HR 100 | Temp 98.0°F | Resp 18 | Ht 68.0 in | Wt 238.7 lb

## 2018-04-02 DIAGNOSIS — Z170 Estrogen receptor positive status [ER+]: Secondary | ICD-10-CM

## 2018-04-02 DIAGNOSIS — C50211 Malignant neoplasm of upper-inner quadrant of right female breast: Secondary | ICD-10-CM

## 2018-04-02 DIAGNOSIS — M25571 Pain in right ankle and joints of right foot: Secondary | ICD-10-CM | POA: Diagnosis not present

## 2018-04-02 DIAGNOSIS — M797 Fibromyalgia: Secondary | ICD-10-CM | POA: Diagnosis not present

## 2018-04-02 DIAGNOSIS — M1990 Unspecified osteoarthritis, unspecified site: Secondary | ICD-10-CM | POA: Diagnosis not present

## 2018-04-02 DIAGNOSIS — K219 Gastro-esophageal reflux disease without esophagitis: Secondary | ICD-10-CM | POA: Diagnosis not present

## 2018-04-02 DIAGNOSIS — C50911 Malignant neoplasm of unspecified site of right female breast: Secondary | ICD-10-CM

## 2018-04-02 LAB — COMPREHENSIVE METABOLIC PANEL
ALK PHOS: 126 U/L (ref 40–150)
ALT: 14 U/L (ref 0–55)
ANION GAP: 6 (ref 3–11)
AST: 16 U/L (ref 5–34)
Albumin: 3.4 g/dL — ABNORMAL LOW (ref 3.5–5.0)
BUN: 23 mg/dL (ref 7–26)
CO2: 28 mmol/L (ref 22–29)
CREATININE: 1.11 mg/dL — AB (ref 0.60–1.10)
Calcium: 9.7 mg/dL (ref 8.4–10.4)
Chloride: 98 mmol/L (ref 98–109)
GFR calc Af Amer: 54 mL/min — ABNORMAL LOW (ref >60)
GFR calc non Af Amer: 47 mL/min — ABNORMAL LOW (ref >60)
GLUCOSE: 151 mg/dL — AB (ref 70–140)
Potassium: 4.4 mmol/L (ref 3.5–5.1)
SODIUM: 132 mmol/L — AB (ref 136–145)
TOTAL PROTEIN: 7.4 g/dL (ref 6.4–8.3)
Total Bilirubin: 0.4 mg/dL (ref 0.2–1.2)

## 2018-04-02 LAB — CBC WITH DIFFERENTIAL/PLATELET
BASOS PCT: 0 %
Basophils Absolute: 0 10*3/uL (ref 0.0–0.1)
Eosinophils Absolute: 0.1 10*3/uL (ref 0.0–0.5)
Eosinophils Relative: 1 %
HEMATOCRIT: 33 % — AB (ref 34.8–46.6)
HEMOGLOBIN: 10.3 g/dL — AB (ref 11.6–15.9)
Lymphocytes Relative: 22 %
Lymphs Abs: 2.6 10*3/uL (ref 0.9–3.3)
MCH: 25.4 pg (ref 25.1–34.0)
MCHC: 31.2 g/dL — AB (ref 31.5–36.0)
MCV: 81.3 fL (ref 79.5–101.0)
MONOS PCT: 8 %
Monocytes Absolute: 1 10*3/uL — ABNORMAL HIGH (ref 0.1–0.9)
NEUTROS ABS: 8.2 10*3/uL — AB (ref 1.5–6.5)
NEUTROS PCT: 69 %
Platelets: 414 10*3/uL — ABNORMAL HIGH (ref 145–400)
RBC: 4.06 MIL/uL (ref 3.70–5.45)
RDW: 16 % — ABNORMAL HIGH (ref 11.2–14.5)
WBC: 11.9 10*3/uL — AB (ref 3.9–10.3)

## 2018-04-02 NOTE — Progress Notes (Signed)
Kiara Brown  [phone] Fax:(336) [phone]     ID: Kiara Brown DOB: [birth year]  MR#: [id number]  BSW#:[id number]  Patient Care Team: Burman Freestone, MD as PCP - General (Internal Medicine) Excell Seltzer, MD as Consulting Physician (General Surgery) Magrinat, Virgie Dad, MD as Consulting Physician (Oncology) Gery Pray, MD as Consulting Physician (Radiation Oncology) [place] Crane, MD as Referring Physician (Internal Medicine) Bensimhon, Shaune Pascal, MD as Consulting Physician (Cardiology) OTHER MD:  CHIEF COMPLAINT: Estrogen receptor positive breast cancer  CURRENT TREATMENT:anastrozole, lapatinib   BREAST CANCER HISTORY: From the original intake note:  "Kiara Brown" herself noted a change around her nipple on the right and brought it to medical attention. She was set up for bilateral diagnostic mammography with tomography and right breast ultrasonography at [facility] 05/23/2017. The breast density was category C. In the right breast upper inner quadrant there was a 2.5 cm area of grouped pleomorphic calcifications correlating with the palpable mass. Also there was a 1.2 cm macrolobulated mass in the upper inner quadrant also in the palpable region. These masses were identifiable by ultrasound with the same dimensions. The right axilla was sonographically negative.  On 05/27/2017 the patient underwent biopsy of the 2 masses in question. 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  The patient's subsequent history is as detailed below.  INTERVAL  HISTORY: Kiara Brown is here today for evaluation and treatment of her triple positive breast cancer.  She continues on anastrozole, which she tolerates well. She denies hot flashes. Her vaginal dryness has improved after attending the pelvic health program.   She is also on lapatinib, which she also tolerates well.  Since her last visit here she had resection of a "cyst" from her right chest wall area.  That relieved the pain she had been experiencing.  The wound was left to heal through secondary intention.  She has no pain in that area anymore.  REVIEW OF SYSTEMS: Kiara Brown is doing well overall. She reports a popping sound that came from her right ankle causing it to become swollen and painful. She still endorses pain, but it has improved. She would like to follow-up with an orthopedist. She denies unusual headaches, visual changes, nausea, vomiting, or dizziness. There has been no unusual cough, phlegm production, or pleurisy. This been no change in bowel or bladder habits. She denies unexplained fatigue or unexplained weight loss, bleeding, rash, or fever. A detailed review of systems was otherwise noncontributory.   PAST MEDICAL HISTORY: Past Medical History:  Diagnosis Date  . Arthritis    osteo, rheumatoid  . Breast cancer ([place])   . Diverticulosis   . Dysrhythmia    hx rapid heartbeat, no cardiologist  . Family history of breast cancer   . Family history of prostate cancer   . Fibromyalgia    peripheral neuropathy  . Fibromyalgia   . GERD (gastroesophageal reflux disease)   . H/O hiatal hernia   . Hyperlipidemia   . PONV (postoperative nausea and vomiting)    "THROAT WITH EXTREME [id number], ulnar nerve surgery  . Sciatica   . Shortness of breath dyspnea  with exertion  . UTI (lower urinary tract infection)     PAST SURGICAL HISTORY: Past Surgical History:  Procedure Laterality Date  . ABDOMINAL HYSTERECTOMY    . APPENDECTOMY    . back injection      July 2018- x1  .  BREAST SURGERY Left 1986   biopsy, Recent 05/2017 at [facility]- had biopsy on R breast- malignant   . CARDIAC CATHETERIZATION  2009   "no blockage per pt"  . CHOLECYSTECTOMY    . CYSTOSCOPY WITH BIOPSY    . INCONTINENCE SURGERY    . IRRIGATION AND DEBRIDEMENT ABSCESS Right 01/28/2018   Procedure: IRRIGATION AND DEBRIDEMENT AXILLARY ABSCESS;  Surgeon: Hoxworth, Benjamin, MD;  Location: [facility];  Service: General;  Laterality: Right;  . JOINT REPLACEMENT    . KNEE ARTHROSCOPY     bilateral  . KNEE ARTHROSCOPY Left 03/17/2014   Procedure: ARTHROSCOPY LEFT KNEE WITH SYNOVECTOMY;  Surgeon: Frank V Aluisio, MD;  Location: [facility];  Service: Orthopedics;  Laterality: Left;  . PORTACATH PLACEMENT Left 07/02/2017   Procedure: INSERTION PORT-A-CATH;  Surgeon: Hoxworth, Benjamin, MD;  Location: [facility];  Service: General;  Laterality: Left;  . RIGHT/LEFT HEART CATH AND CORONARY ANGIOGRAPHY N/A 08/30/2017   Procedure: RIGHT/LEFT HEART CATH AND CORONARY ANGIOGRAPHY;  Surgeon: McLean, Dalton S, MD;  Location: [facility];  Service: Cardiovascular;  Laterality: N/A;  . ROTATOR CUFF REPAIR     right  . SIMPLE MASTECTOMY WITH AXILLARY SENTINEL NODE BIOPSY Right 07/02/2017   Procedure: RIGHT TOTAL  MASTECTOMY WITH AXILLARY SENTINEL LYMPH  NODE BIOPSY;  Surgeon: Hoxworth, Benjamin, MD;  Location: [facility];  Service: General;  Laterality: Right;  . TOTAL KNEE ARTHROPLASTY  08/04/2012   Procedure: TOTAL KNEE ARTHROPLASTY;  Surgeon: Frank V Aluisio, MD;  Location: [facility];  Service: Orthopedics;  Laterality: Left;  . TOTAL KNEE ARTHROPLASTY Right 04/11/2015   Procedure: RIGHT TOTAL KNEE ARTHROPLASTY;  Surgeon: Frank Aluisio, MD;  Location: [facility];  Service: Orthopedics;  Laterality: Right;  . ulnar nerve surgery Right 1986    FAMILY HISTORY Family History  Problem Relation Age of Onset  . Hypertension Mother   . Stroke Mother   . Breast cancer Mother 70       died at 77  . Hypertension Father   . Heart attack Father         died at 68  . Hypertension Brother        MI  . Prostate cancer Brother 50  . Hypertension Brother        MI  . Lung cancer Brother        possible lung cancer, spot found on lung being monitored  . Hypertension Brother        MI  . Non-Hodgkin's lymphoma Sister 75  . Breast cancer Other 75       is currently 90  . Breast cancer Other 55  . Cancer Cousin 62       type of cancer unk.   . Cancer Other        type unknown, eye/face   . Cancer Other 65       died from cancer in his 60's, type unknown   The patient's father died from a heart attack at age 68. The patient's mother was diagnosed with breast cancer at age 70 and died at age 77. The patient had 5 brothers, 3 sisters. One sister had breast cancer at age 75. One brother was diagnosed with prostate   cancer at age 50. One Brother died from lung cancer at age 79. One sister had non-Hodgkin's lymphoma diagnosed at age 75.   GYNECOLOGIC HISTORY:  No LMP recorded. Patient has had a hysterectomy.  menarche age 11, first live birth age 25. All 3 of her births were premature including 1 set of twins. One of the twins died a few hours after birth. She underwent hysterectomy in 1981 with unilateral salpingo-oophorectomy. 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  She has led groups as far away as [place] and [place] and has led many in the [place] cruises as well.  Her husband, Gerald began a business installing fences which is now carried on by his son Tyler. Gerald is also the church organist. Son, Travis is a truck driver in [place]. The patient has one grandchild. She is a [employer].     ADVANCED DIRECTIVES: In place. The patient's husband is her healthcare power of attorney   HEALTH MAINTENANCE: Social History   Tobacco Use  . Smoking status: Never Smoker  . Smokeless tobacco: Never Used  Substance  Use Topics  . Alcohol use: No  . Drug use: No     Colonoscopy:2017 / [place]   PAP:  Bone density:June 2014, normal    Allergies  Allergen Reactions  . Duloxetine Other (See Comments)    UNSPECIFIED REACTION   . Nsaids Rash  . Oseltamivir Diarrhea and Other (See Comments)    Abdominal pain     Current Outpatient Medications  Medication Sig Dispense Refill  . acetaminophen (TYLENOL) 500 MG tablet Take 500 mg by mouth every 6 (six) hours as needed for moderate pain or headache.     . albuterol (PROVENTIL HFA;VENTOLIN HFA) 108 (90 Base) MCG/ACT inhaler Inhale 2 puffs into the lungs every 6 (six) hours as needed for wheezing or shortness of breath. 1 Inhaler 2  . anastrozole (ARIMIDEX) 1 MG tablet Take 1 tablet (1 mg total) by mouth daily. (Patient taking differently: Take 1 mg by mouth at bedtime. ) 90 tablet 4  . aspirin EC 81 MG tablet Take 81 mg by mouth daily.    . atenolol (TENORMIN) 50 MG tablet Take 50 mg by mouth daily before breakfast.     . atorvastatin (LIPITOR) 80 MG tablet Take 1 tablet (80 mg total) by mouth daily. 30 tablet 3  . buPROPion (WELLBUTRIN XL) 150 MG 24 hr tablet Take 150 mg by mouth daily before breakfast.    . folic acid (FOLVITE) 1 MG tablet Take 1 mg by mouth daily.    . furosemide (LASIX) 20 MG tablet Take 40 mg by mouth daily.    . HYDROcodone-acetaminophen (NORCO) 5-325 MG tablet Take 1 tablet by mouth every 4 (four) hours as needed for moderate pain. 30 tablet 0  . isosorbide mononitrate (IMDUR) 30 MG 24 hr tablet Take 0.5 tablets (15 mg total) by mouth daily. 15 tablet 3  . lapatinib (TYKERB) 250 MG tablet Take 3 tablets (750 mg total) by mouth daily. Take on an empty stomach, 1 hour before or 2 hours after food. 90 tablet 6  . omeprazole (PRILOSEC) 40 MG capsule Take 40 mg by mouth every morning.    . ondansetron (ZOFRAN) 8 MG tablet Take 1 tablet (8 mg total) by mouth every 8 (eight) hours as needed for nausea or vomiting. 20 tablet 0  .  oxyCODONE (  OXY IR/ROXICODONE) 5 MG immediate release tablet Take 1 tablet (5 mg total) by mouth every 6 (six) hours as needed for severe pain. (Patient not taking: Reported on 02/21/2018) 20 tablet 0  . Potassium Chloride ER 20 MEQ TBCR Take 20 mg by mouth daily at 2 PM. 30 tablet 6  . traMADol (ULTRAM) 50 MG tablet Take 50 mg by mouth every 6 (six) hours as needed.     No current facility-administered medications for this visit.     OBJECTIVE: Middle-aged white woman who appears stated age  Vitals:   04/02/18 1423  BP: (!) 127/43  Pulse: 100  Resp: 18  Temp: 98 F (36.7 C)  SpO2: 100%     Body mass index is 36.29 kg/m.  @.WEIGHTLAST3@ Filed Weights   04/02/18 1423  Weight: 238 lb 11.2 oz (108.3 kg)     ECOG FS:2 - Symptomatic, <50% confined to bed  Sclerae unicteric, pupils round and equal Oropharynx clear and moist No cervical or supraclavicular adenopathy Lungs no rales or rhonchi Heart regular rate and rhythm Abd soft, nontender, positive bowel sounds MSK no focal spinal tenderness, no upper extremity lymphedema Neuro: nonfocal, well oriented, appropriate affect Breasts: On the right side she is status post mastectomy and status post recent excision of a painful area,, which is healing very nicely by secondary intention.  It is too much closed over.  The left breast is unremarkable.  Both axillae are benign.  LAB RESULTS:  CMP     Component Value Date/Time   NA 136 02/21/2018 1139   NA 131 (L) 09/06/2017 1018   K 3.3 (L) 02/21/2018 1139   K 4.2 09/06/2017 1018   CL 100 02/21/2018 1139   CO2 26 02/21/2018 1139   CO2 25 09/06/2017 1018   GLUCOSE 192 (H) 02/21/2018 1139   GLUCOSE 187 (H) 09/06/2017 1018   BUN 16 02/21/2018 1139   BUN 11.2 09/06/2017 1018   CREATININE 1.04 02/21/2018 1139   CREATININE 1.2 (H) 09/06/2017 1018   CALCIUM 9.3 02/21/2018 1139   CALCIUM 9.3 09/06/2017 1018   PROT 7.2 01/13/2018 1221   PROT 6.9 09/06/2017 1018   ALBUMIN 3.4 (L)  01/13/2018 1221   ALBUMIN 3.4 (L) 09/06/2017 1018   AST 15 01/13/2018 1221   AST 20 09/06/2017 1018   ALT 11 01/13/2018 1221   ALT 14 09/06/2017 1018   ALKPHOS 110 01/13/2018 1221   ALKPHOS 103 09/06/2017 1018   BILITOT 0.5 01/13/2018 1221   BILITOT 0.57 09/06/2017 1018   GFRNONAA 51 (L) 02/21/2018 1139   GFRAA 59 (L) 02/21/2018 1139    No results found for: TOTALPROTELP, ALBUMINELP, A1GS, A2GS, BETS, BETA2SER, GAMS, MSPIKE, SPEI  No results found for: KPAFRELGTCHN, LAMBDASER, KAPLAMBRATIO  Lab Results  Component Value Date   WBC 11.9 (H) 04/02/2018   NEUTROABS 8.2 (H) 04/02/2018   HGB 10.3 (L) 04/02/2018   HCT 33.0 (L) 04/02/2018   MCV 81.3 04/02/2018   PLT 414 (H) 04/02/2018      Chemistry      Component Value Date/Time   NA 136 02/21/2018 1139   NA 131 (L) 09/06/2017 1018   K 3.3 (L) 02/21/2018 1139   K 4.2 09/06/2017 1018   CL 100 02/21/2018 1139   CO2 26 02/21/2018 1139   CO2 25 09/06/2017 1018   BUN 16 02/21/2018 1139   BUN 11.2 09/06/2017 1018   CREATININE 1.04 02/21/2018 1139   CREATININE 1.2 (H) 09/06/2017 1018      Component   Value Date/Time   CALCIUM 9.3 02/21/2018 1139   CALCIUM 9.3 09/06/2017 1018   ALKPHOS 110 01/13/2018 1221   ALKPHOS 103 09/06/2017 1018   AST 15 01/13/2018 1221   AST 20 09/06/2017 1018   ALT 11 01/13/2018 1221   ALT 14 09/06/2017 1018   BILITOT 0.5 01/13/2018 1221   BILITOT 0.57 09/06/2017 1018       No results found for: LABCA2  No components found for: LABCAN125  No results for input(s): INR in the last 168 hours.  Urinalysis    Component Value Date/Time   COLORURINE STRAW (A) 02/21/2018 1138   APPEARANCEUR CLEAR 02/21/2018 1138   LABSPEC 1.006 02/21/2018 1138   PHURINE 6.0 02/21/2018 1138   GLUCOSEU NEGATIVE 02/21/2018 1138   HGBUR SMALL (A) 02/21/2018 1138   BILIRUBINUR NEGATIVE 02/21/2018 1138   KETONESUR NEGATIVE 02/21/2018 1138   PROTEINUR NEGATIVE 02/21/2018 1138   UROBILINOGEN 0.2 04/06/2015 1332    NITRITE NEGATIVE 02/21/2018 1138   LEUKOCYTESUR NEGATIVE 02/21/2018 1138     STUDIES: Next echocardiogram scheduled for early July.  Dg Ankle 2 Views Right  Result Date: 03/26/2018 CLINICAL DATA:  76 y/o F; 2 days of ankle pain. Heard a "pop" while walking. EXAM: RIGHT ANKLE - 2 VIEW COMPARISON:  None. FINDINGS: There is no evidence of fracture, dislocation, or joint effusion. Small well corticated bony density adjacent to medial malleolus probably representing sequelae of old fracture. Plantar calcaneal enthesophyte. IMPRESSION: 1. No acute fracture or dislocation identified. 2. Small well corticated bony density adjacent to medial malleolus, probably sequelae of old fracture. 3. Plantar calcaneal enthesophyte. Electronically Signed   By: Lance  Furusawa-Stratton M.D.   On: 03/26/2018 16:20    ELIGIBLE FOR AVAILABLE RESEARCH PROTOCOL: No  ASSESSMENT: 76 y.o. [place] woman status post right breast upper inner quadrant biopsy 2 on 05/27/2017 showing a clinical T1c N0, stage IA invasive ductal carcinoma, grade 3, estrogen and progesterone receptor positive, HER-2 amplified, with an MIB-1 of 60%  (1) genetics testing 07/14/2017 through the Multi-Cancer panel + Breast Cancer Panel found no deleterious mutations in  ALK, APC,AKT1, ATM, AXIN2,BAP1,  BARD1, BLM, BMPR1A, BRCA1, BRCA2, BRIP1, CASR, CDC73, CDH1, CDK4, CDKN1B, CDKN1C, CDKN2A (p14ARF), CDKN2A (p16INK4a), CEBPA, CHEK2, CTNNA1, DICER1, DIS3L2, EGFR (c.2369C>T, p.Thr790Met variant only), EPCAM (Deletion/duplication testing only), FH, FAM175A, FLCN, GATA2, GPC3, GREM1 (Promoter region deletion/duplication testing only), HOXB13 (c.251G>A, p.Gly84Glu), HRAS, KIT, MAX, MEN1, MET, MITF (c.952G>A, p.Glu318Lys variant only), MLH1, MSH2, MSH3, MSH6, MUTYH, NBN, NF1, NF2, NTHL1, PALB2, PDGFRA, PHOX2B, PMS2, POLD1, POLE, POT1, PRKAR1A, PTCH1, PTEN, RAD50, RAD51C, RAD51D, RB1, RECQL4, RET, RUNX1, SDHAF2, SDHA (sequence changes only), SDHB, SDHC, SDHD,  SMAD4, SMARCA4, SMARCB1, SMARCE1, STK11, SUFU, TERC, TERT, TMEM127, TP53, TSC1, TSC2, VHL, WRN, WT1, FANCC,MRE11,PIK3CA,RINT1,XRCC2.  (a)  3 Variants of Uncertain significance (VUS) were identified.  BRCA1 c.2551G>A (p.Glu851Lys)----CHEK2 c.679G>A (p.Gly227Arg).  The CHECK 2 variant has been reported as possibly mosaic--- SDHA c.704T>C (p.Ile235Thr).  (2) anastrozole started 06/05/2017, to be continued and minimum of 5 years   (3) status post right mastectomy and sentinel lymph node sampling 07/02/2017 for an mpT2 pN0, stage IB invasive ductal carcinoma, grade 3, with negative margins.  (a) the patient opted against reconstruction  (4) trastuzumab for 6 months started 08/09/2017, discontinued after one dose with significant reaction  (a) baseline echocardiogram 06/19/2017 shows an excellent ejection fraction  (b) Echocardiogram 09/16/2017 showed an ejection fraction of 60-65%  (5) started lapatinib 12/02/2017-- to be continued 6 months  (a) echocardiogram 01/21/2018 shows an ejection fraction in   the 60-65%. PLAN: Kiara Brown will soon be a year out from definitive surgery for breast cancer.  There is no evidence of disease recurrence or activity.  She is tolerating anastrozole well and she is also doing very well with the lapatinib.  The plan of the lapatinib is to discontinue it in July.  She is already scheduled for a repeat echocardiogram early that month.  She will see [place] shortly after that and assuming all is well we will discontinue the lapatinib but continue of course the anastrozole.  She is going to bring her ankle problem to her rheumatologist.  She did not want referral to another orthopedist today.  She knows to call for any other issues that may develop before the next visit.   Magrinat, Virgie Dad, MD  04/02/18 2:46 PM Medical Oncology and Hematology [facility] [address] [place], [place] [address] Tel. [phone]    Fax. [phone]    This document  serves as a record of services personally performed by Chauncey Cruel, MD. It was created on his behalf by Margit Banda, a trained medical scribe. The creation of this record is based on the scribe's personal observations and the provider's statements to them.   I have reviewed the above documentation for accuracy and completeness, and I agree with the above.

## 2018-04-02 NOTE — Telephone Encounter (Signed)
Gave avs and calendar ° °

## 2018-04-16 DIAGNOSIS — M0609 Rheumatoid arthritis without rheumatoid factor, multiple sites: Secondary | ICD-10-CM | POA: Diagnosis not present

## 2018-04-16 DIAGNOSIS — R252 Cramp and spasm: Secondary | ICD-10-CM | POA: Diagnosis not present

## 2018-05-27 DIAGNOSIS — Z1231 Encounter for screening mammogram for malignant neoplasm of breast: Secondary | ICD-10-CM | POA: Diagnosis not present

## 2018-05-27 DIAGNOSIS — Z853 Personal history of malignant neoplasm of breast: Secondary | ICD-10-CM | POA: Diagnosis not present

## 2018-06-02 ENCOUNTER — Ambulatory Visit (HOSPITAL_COMMUNITY)
Admission: RE | Admit: 2018-06-02 | Discharge: 2018-06-02 | Disposition: A | Discharge status: Home / Self Care | Payer: Medicare Other | Source: Ambulatory Visit | Attending: Cardiology | Admitting: Cardiology

## 2018-06-02 ENCOUNTER — Ambulatory Visit (HOSPITAL_BASED_OUTPATIENT_CLINIC_OR_DEPARTMENT_OTHER)
Admission: RE | Admit: 2018-06-02 | Discharge: 2018-06-02 | Disposition: A | Discharge status: Home / Self Care | Payer: Medicare Other | Source: Ambulatory Visit | Attending: Cardiology | Admitting: Cardiology

## 2018-06-02 VITALS — BP 153/61 | HR 73 | Wt 243.0 lb

## 2018-06-02 DIAGNOSIS — Z79899 Other long term (current) drug therapy: Secondary | ICD-10-CM | POA: Diagnosis not present

## 2018-06-02 DIAGNOSIS — E785 Hyperlipidemia, unspecified: Secondary | ICD-10-CM | POA: Insufficient documentation

## 2018-06-02 DIAGNOSIS — C50211 Malignant neoplasm of upper-inner quadrant of right female breast: Secondary | ICD-10-CM | POA: Insufficient documentation

## 2018-06-02 DIAGNOSIS — I209 Angina pectoris, unspecified: Secondary | ICD-10-CM

## 2018-06-02 DIAGNOSIS — E876 Hypokalemia: Secondary | ICD-10-CM

## 2018-06-02 DIAGNOSIS — Z170 Estrogen receptor positive status [ER+]: Secondary | ICD-10-CM

## 2018-06-02 DIAGNOSIS — Z5181 Encounter for therapeutic drug level monitoring: Secondary | ICD-10-CM | POA: Insufficient documentation

## 2018-06-02 DIAGNOSIS — C50911 Malignant neoplasm of unspecified site of right female breast: Secondary | ICD-10-CM

## 2018-06-02 LAB — LIPID PANEL
CHOL/HDL RATIO: 4 ratio
Cholesterol: 175 mg/dL (ref 0–200)
HDL: 44 mg/dL (ref >40)
LDL CALC: 87 mg/dL (ref 0–99)
Triglycerides: 220 mg/dL — ABNORMAL HIGH (ref <150)
VLDL: 44 mg/dL — ABNORMAL HIGH (ref 0–40)

## 2018-06-02 LAB — BASIC METABOLIC PANEL
Anion gap: 9 (ref 5–15)
BUN: 18 mg/dL (ref 8–23)
CO2: 25 mmol/L (ref 22–32)
Calcium: 9.3 mg/dL (ref 8.9–10.3)
Chloride: 101 mmol/L (ref 98–111)
Creatinine, Ser: 1.12 mg/dL — ABNORMAL HIGH (ref 0.44–1.00)
GFR calc Af Amer: 54 mL/min — ABNORMAL LOW (ref >60)
GFR calc non Af Amer: 46 mL/min — ABNORMAL LOW (ref >60)
Glucose, Bld: 191 mg/dL — ABNORMAL HIGH (ref 70–99)
Potassium: 3.6 mmol/L (ref 3.5–5.1)
SODIUM: 135 mmol/L (ref 135–145)

## 2018-06-02 NOTE — Progress Notes (Signed)
  Echocardiogram 2D Echocardiogram has been performed.  Kiara Brown 06/02/2018, 9:47 AM

## 2018-06-02 NOTE — Progress Notes (Signed)
Cardio-Oncology Clinic Consult Note   Referring Physician: Dr. Jana Hakim Primary Care: Dr. Nance Pew Primary Cardiologist: Dr. Aundra Dubin  HPI: Patient seen in cardio-oncology clinic at the request of Dr. Jana Hakim.  Kiara Brown is a 77 y.o. female with past medical history of hyperlipidemia and breast cancer who returns to cardio-oncology clinic for followup of dyspnea and chest pain.   Cancer Profile      Initial Diagnosis   05/23/17  Mammogram and right breast US at [facility] - Category C breast density in R upper inner quadrant 2.5 cm of grouped pleomorphic calcifications - 1.2 cm macrolobulated mass R upper inner quadrant - Right axilla sonographically negative.      Surgery   Biopsy 05/27/2017  Biopsy of 2 above masses.   - Larger mass, Grade 3 DCIS. 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  - Both with strong staining intensity, with MIB-1 of 50%, and HER-2 amplified     - Awaiting definitive surgery    Treatment Plan  1. Genetics testing pending 2. Anastrozole started 06/05/2017, to be continued and minimum of 5 years 3. Definite surgery pending: Likely mastectomy 4. Trastuzumab for 6 months to be given adjuvantly 5. Adjuvant radiation as appropriate  - Echo (7/18): EF 55-60%, GLS -20.4 - Echo (9/18): EF 60-65%, moderate MR, PASP 53 mmHg.  - LHC/RHC (10/18): Mean RA 12, PA 29/12 mean 21, mean PCWP 17, CI 2.85, EF 65-70%.  70% ostial D2 at trifurcation site where a medium D1 and medium D2 originate from the proximal LAD in close proximity.  - Echo (10/18): EF 60-65%, GLS -19.9%, mild MR/TR, PASP 30 mmHg.  - Echo (2/19): EF 60-65%, mild MR - Echo (7/19): EF 55-60%, GLS -20.1%, normal RV size and systolic function  She was admitted in 9/18 with chest pain and dyspnea.  This happened shortly after her first Herceptin infusion. She developed hypotension,  diaphoresis, and rigors.  This progressed to dyspnea and exertional chest pain.  She was admitted.  TnI was mildly elevated and 0.09 and BNP was elevated at 227.  She was diuresed with IV Lasix and given Solumedrol for pneumonitis versus CHF exacerbation.  CTA chest showed no PE but did show significant coronary calcification as well as pulmonary edema.  She was sent home on Lasix 20 mg daily.    Subsequently, we did right and left heart cath which showed mildly elevated right heart filling pressure and a 70% ostial D2 stenosis.  Lasix was increased to 40 mg daily and she was started on Imdur.   She returns for cardio-oncology followup.  She has been doing well overall, rare atypical chest pain now.  Tolerating Imdur 15 mg daily.  She is stably short of breath after walking about 50 yards.  She is planning to start water aerobics.  No orthopnea/PND.  BP high today, but she says that it runs SBP 120s-130s when she checks at home.  She will complete lapatinib this month.   Labs (9/18): K 4.3, creatinine 1.2, LDL 84, HDL 38 Labs (1/19): LDL 87 Labs (5/19): K 4.4, creatinine 1.11  ROS: All systems reviewed and negative except as per HPI.  Past Medical History:  Diagnosis Date  . Arthritis    osteo, rheumatoid  . Breast cancer ([place])   . Diverticulosis   . Dysrhythmia    hx rapid heartbeat, no cardiologist  . Family history of breast cancer   .  Family history of prostate cancer   . Fibromyalgia    peripheral neuropathy  . Fibromyalgia   . GERD (gastroesophageal reflux disease)   . H/O hiatal hernia   . Hyperlipidemia   . PONV (postoperative nausea and vomiting)    "THROAT WITH EXTREME QHUTMLY"6503, ulnar nerve surgery  . Sciatica   . Shortness of breath dyspnea    with exertion  . UTI (lower urinary tract infection)     Current Outpatient Medications  Medication Sig Dispense Refill  . acetaminophen (TYLENOL) 500 MG tablet Take 500 mg by mouth every 6 (six) hours as needed for moderate  pain or headache.     . albuterol (PROVENTIL HFA;VENTOLIN HFA) 108 (90 Base) MCG/ACT inhaler Inhale 2 puffs into the lungs every 6 (six) hours as needed for wheezing or shortness of breath. 1 Inhaler 2  . anastrozole (ARIMIDEX) 1 MG tablet Take 1 tablet (1 mg total) by mouth daily. (Patient taking differently: Take 1 mg by mouth at bedtime. ) 90 tablet 4  . aspirin EC 81 MG tablet Take 81 mg by mouth daily.    Marland Kitchen atenolol (TENORMIN) 50 MG tablet Take 50 mg by mouth daily before breakfast.     . atorvastatin (LIPITOR) 80 MG tablet Take 1 tablet (80 mg total) by mouth daily. 30 tablet 3  . buPROPion (WELLBUTRIN XL) 150 MG 24 hr tablet Take 150 mg by mouth daily before breakfast.    . folic acid (FOLVITE) 1 MG tablet Take 1 mg by mouth daily.    . furosemide (LASIX) 20 MG tablet Take 40 mg by mouth daily.    Marland Kitchen HYDROcodone-acetaminophen (NORCO) 5-325 MG tablet Take 1 tablet by mouth every 4 (four) hours as needed for moderate pain. 30 tablet 0  . isosorbide mononitrate (IMDUR) 30 MG 24 hr tablet Take 0.5 tablets (15 mg total) by mouth daily. 15 tablet 3  . lapatinib (TYKERB) 250 MG tablet Take 3 tablets (750 mg total) by mouth daily. Take on an empty stomach, 1 hour before or 2 hours after food. 90 tablet 6  . omeprazole (PRILOSEC) 40 MG capsule Take 40 mg by mouth every morning.    . ondansetron (ZOFRAN) 8 MG tablet Take 1 tablet (8 mg total) by mouth every 8 (eight) hours as needed for nausea or vomiting. 20 tablet 0  . oxyCODONE (OXY IR/ROXICODONE) 5 MG immediate release tablet Take 1 tablet (5 mg total) by mouth every 6 (six) hours as needed for severe pain. 20 tablet 0  . Potassium Chloride ER 20 MEQ TBCR Take 20 mg by mouth daily at 2 PM. 30 tablet 6  . traMADol (ULTRAM) 50 MG tablet Take 50 mg by mouth every 6 (six) hours as needed.     No current facility-administered medications for this encounter.     Allergies  Allergen Reactions  . Duloxetine Other (See Comments)    UNSPECIFIED  REACTION   . Nsaids Rash  . Oseltamivir Diarrhea and Other (See Comments)    Abdominal pain       Social History   Socioeconomic History  . Marital status: Married    Spouse name: Not on file  . Number of children: Not on file  . Years of education: Not on file  . Highest education level: Not on file  Occupational History  . Not on file  Social Needs  . Financial resource strain: Not on file  . Food insecurity:    Worry: Not on file    Inability:  Not on file  . Transportation needs:    Medical: Not on file    Non-medical: Not on file  Tobacco Use  . Smoking status: Never Smoker  . Smokeless tobacco: Never Used  Substance and Sexual Activity  . Alcohol use: No  . Drug use: No  . Sexual activity: Not on file  Lifestyle  . Physical activity:    Days per week: Not on file    Minutes per session: Not on file  . Stress: Not on file  Relationships  . Social connections:    Talks on phone: Not on file    Gets together: Not on file    Attends religious service: Not on file    Active member of club or organization: Not on file    Attends meetings of clubs or organizations: Not on file    Relationship status: Not on file  . Intimate partner violence:    Fear of current or ex partner: Not on file    Emotionally abused: Not on file    Physically abused: Not on file    Forced sexual activity: Not on file  Other Topics Concern  . Not on file  Social History Narrative  . Not on file      Family History  Problem Relation Age of Onset  . Hypertension Mother   . Stroke Mother   . Breast cancer Mother 71       died at 54  . Hypertension Father   . Heart attack Father        died at 78  . Hypertension Brother        MI  . Prostate cancer Brother 71  . Hypertension Brother        MI  . Lung cancer Brother        possible lung cancer, spot found on lung being monitored  . Hypertension Brother        MI  . Non-Hodgkin's lymphoma Sister 75  . Breast cancer Other 35        is currently 90  . Breast cancer Other 55  . Cancer Cousin 62       type of cancer unk.   . Cancer Other        type unknown, eye/face   . Cancer Other 55       died from cancer in his 64's, type unknown   Vitals:   06/02/18 1021  BP: (!) 153/61  Pulse: 73  SpO2: 99%  Weight: 243 lb (110.2 kg)   Physical Exam    General: NAD Neck: No JVD, no thyromegaly or thyroid nodule.  Lungs: Clear to auscultation bilaterally with normal respiratory effort. CV: Nondisplaced PMI.  Heart regular S1/S2, no S3/S4, no murmur.  No peripheral edema.  No carotid bruit.  Normal pedal pulses.  Abdomen: Soft, nontender, no hepatosplenomegaly, no distention.  Skin: Intact without lesions or rashes.  Neurologic: Alert and oriented x 3.  Psych: Normal affect. Extremities: No clubbing or cyanosis.  HEENT: Normal.   Assessment/Plan    1. CAD: LHC (10/18) with 70% ostial D2 stenosis at a trifurcation (2 medium-sized diagonals come off in very close proximity from the proximal LAD).  It is possible that this lesion could lead to chest pain with exertion.  Given the position of the stenosis and location in a branch vessel that is not large, I elected to treat medically.  She is on Imdur 15 mg daily with resolution of headaches.  Minimal chest  pain now.  - Continue ASA 81 daily.  - Continue Imdur 15 mg daily.  - Continue atenolol. - She is on atorvastatin.  2. Chronic diastolic CHF: Echo 30/05 with EF 60-65%, mild MR, PASP 30 mmHg.  She is now on Lasix 40 mg daily and euvolemic on exam. - Continue Lasix 40 mg daily.  BMET today.  3. Breast cancer: She had one dose of Herceptin with a significant reaction after getting the medication.  She has now completed lapatinib.  I reviewed today's echo, EF and strain parameters stable.  - Now that she has finished lapatinib, no further screening echoes required.  4. Hyperlipidemia: Goal LDL < 70 with CAD.  Check lipids today.  5. HTN: BP high today but has been  controlled at home on current regimen.   Followup 1 year.   Loralie Champagne 06/02/2018

## 2018-06-02 NOTE — Patient Instructions (Signed)
Labs drawn today (if we do not call you, then your lab work was stable)   Your physician recommends that you schedule a follow-up appointment in: 1 year with Dr. Aundra Dubin

## 2018-06-04 ENCOUNTER — Inpatient Hospital Stay: Payer: Medicare Other

## 2018-06-04 ENCOUNTER — Telehealth: Payer: Self-pay | Admitting: Adult Health

## 2018-06-04 ENCOUNTER — Encounter: Payer: Self-pay | Admitting: Adult Health

## 2018-06-04 ENCOUNTER — Inpatient Hospital Stay: Payer: Medicare Other | Attending: Oncology | Admitting: Adult Health

## 2018-06-04 VITALS — BP 154/69 | HR 85 | Temp 97.7°F | Resp 19 | Ht 68.0 in | Wt 245.3 lb

## 2018-06-04 DIAGNOSIS — Z90722 Acquired absence of ovaries, bilateral: Secondary | ICD-10-CM | POA: Diagnosis not present

## 2018-06-04 DIAGNOSIS — R682 Dry mouth, unspecified: Secondary | ICD-10-CM | POA: Diagnosis not present

## 2018-06-04 DIAGNOSIS — Z7982 Long term (current) use of aspirin: Secondary | ICD-10-CM | POA: Insufficient documentation

## 2018-06-04 DIAGNOSIS — Z79899 Other long term (current) drug therapy: Secondary | ICD-10-CM | POA: Diagnosis not present

## 2018-06-04 DIAGNOSIS — Z803 Family history of malignant neoplasm of breast: Secondary | ICD-10-CM | POA: Insufficient documentation

## 2018-06-04 DIAGNOSIS — Z807 Family history of other malignant neoplasms of lymphoid, hematopoietic and related tissues: Secondary | ICD-10-CM | POA: Insufficient documentation

## 2018-06-04 DIAGNOSIS — D509 Iron deficiency anemia, unspecified: Secondary | ICD-10-CM

## 2018-06-04 DIAGNOSIS — C50211 Malignant neoplasm of upper-inner quadrant of right female breast: Secondary | ICD-10-CM | POA: Diagnosis not present

## 2018-06-04 DIAGNOSIS — Z79811 Long term (current) use of aromatase inhibitors: Secondary | ICD-10-CM | POA: Diagnosis not present

## 2018-06-04 DIAGNOSIS — I2510 Atherosclerotic heart disease of native coronary artery without angina pectoris: Secondary | ICD-10-CM

## 2018-06-04 DIAGNOSIS — Z170 Estrogen receptor positive status [ER+]: Principal | ICD-10-CM

## 2018-06-04 DIAGNOSIS — Z90710 Acquired absence of both cervix and uterus: Secondary | ICD-10-CM | POA: Diagnosis not present

## 2018-06-04 DIAGNOSIS — Z801 Family history of malignant neoplasm of trachea, bronchus and lung: Secondary | ICD-10-CM | POA: Diagnosis not present

## 2018-06-04 DIAGNOSIS — B370 Candidal stomatitis: Secondary | ICD-10-CM

## 2018-06-04 DIAGNOSIS — M1990 Unspecified osteoarthritis, unspecified site: Secondary | ICD-10-CM | POA: Diagnosis not present

## 2018-06-04 DIAGNOSIS — E785 Hyperlipidemia, unspecified: Secondary | ICD-10-CM | POA: Diagnosis not present

## 2018-06-04 LAB — IRON AND TIBC
IRON: 23 ug/dL — AB (ref 41–142)
SATURATION RATIOS: 6 % — AB (ref 21–57)
TIBC: 388 ug/dL (ref 236–444)
UIBC: 365 ug/dL

## 2018-06-04 LAB — COMPREHENSIVE METABOLIC PANEL
ALK PHOS: 138 U/L — AB (ref 38–126)
ALT: 17 U/L (ref 0–44)
AST: 17 U/L (ref 15–41)
Albumin: 3.5 g/dL (ref 3.5–5.0)
Anion gap: 10 (ref 5–15)
BILIRUBIN TOTAL: 0.3 mg/dL (ref 0.3–1.2)
BUN: 21 mg/dL (ref 8–23)
CALCIUM: 9.2 mg/dL (ref 8.9–10.3)
CO2: 25 mmol/L (ref 22–32)
CREATININE: 1.2 mg/dL — AB (ref 0.44–1.00)
Chloride: 98 mmol/L (ref 98–111)
GFR, EST AFRICAN AMERICAN: 50 mL/min — AB (ref >60)
GFR, EST NON AFRICAN AMERICAN: 43 mL/min — AB (ref >60)
Glucose, Bld: 192 mg/dL — ABNORMAL HIGH (ref 70–99)
Potassium: 3.6 mmol/L (ref 3.5–5.1)
Sodium: 133 mmol/L — ABNORMAL LOW (ref 135–145)
TOTAL PROTEIN: 7.2 g/dL (ref 6.5–8.1)

## 2018-06-04 LAB — CBC WITH DIFFERENTIAL/PLATELET
BASOS PCT: 0 %
Basophils Absolute: 0 10*3/uL (ref 0.0–0.1)
Eosinophils Absolute: 0.1 10*3/uL (ref 0.0–0.5)
Eosinophils Relative: 1 %
HEMATOCRIT: 31.2 % — AB (ref 34.8–46.6)
HEMOGLOBIN: 9.8 g/dL — AB (ref 11.6–15.9)
LYMPHS ABS: 2.4 10*3/uL (ref 0.9–3.3)
Lymphocytes Relative: 30 %
MCH: 24.6 pg — ABNORMAL LOW (ref 25.1–34.0)
MCHC: 31.4 g/dL — AB (ref 31.5–36.0)
MCV: 78.2 fL — ABNORMAL LOW (ref 79.5–101.0)
MONOS PCT: 8 %
Monocytes Absolute: 0.6 10*3/uL (ref 0.1–0.9)
NEUTROS PCT: 61 %
Neutro Abs: 4.7 10*3/uL (ref 1.5–6.5)
Platelets: 332 10*3/uL (ref 145–400)
RBC: 3.99 MIL/uL (ref 3.70–5.45)
RDW: 16.4 % — ABNORMAL HIGH (ref 11.2–14.5)
WBC: 7.8 10*3/uL (ref 3.9–10.3)

## 2018-06-04 LAB — FERRITIN: FERRITIN: 19 ng/mL (ref 11–307)

## 2018-06-04 MED ORDER — FLUCONAZOLE 150 MG PO TABS: 150.0000 mg | ORAL_TABLET | Freq: Every day | ORAL | 0 refills | Status: DC

## 2018-06-04 NOTE — Telephone Encounter (Signed)
Gave patient avs and calendar of upcoming appts.  °

## 2018-06-04 NOTE — Patient Instructions (Signed)
Bone Health Bones protect organs, store calcium, and anchor muscles. Good health habits, such as eating nutritious foods and exercising regularly, are important for maintaining healthy bones. They can also help to prevent a condition that causes bones to lose density and become weak and brittle (osteoporosis). Why is bone mass important? Bone mass refers to the amount of bone tissue that you have. The higher your bone mass, the stronger your bones. An important step toward having healthy bones throughout life is to have strong and dense bones during childhood. A young adult who has a high bone mass is more likely to have a high bone mass later in life. Bone mass at its greatest it is called peak bone mass. A large decline in bone mass occurs in older adults. In women, it occurs about the time of menopause. During this time, it is important to practice good health habits, because if more bone is lost than what is replaced, the bones will become less healthy and more likely to break (fracture). If you find that you have a low bone mass, you may be able to prevent osteoporosis or further bone loss by changing your diet and lifestyle. How can I find out if my bone mass is low? Bone mass can be measured with an X-ray test that is called a bone mineral density (BMD) test. This test is recommended for all women who are age 65 or older. It may also be recommended for men who are age 70 or older, or for people who are more likely to develop osteoporosis due to:  Having bones that break easily.  Having a long-term disease that weakens bones, such as kidney disease or rheumatoid arthritis.  Having menopause earlier than normal.  Taking medicine that weakens bones, such as steroids, thyroid hormones, or hormone treatment for breast cancer or prostate cancer.  Smoking.  Drinking three or more alcoholic drinks each day.  What are the nutritional recommendations for healthy bones? To have healthy bones, you  need to get enough of the right minerals and vitamins. Most nutrition experts recommend getting these nutrients from the foods that you eat. Nutritional recommendations vary from person to person. Ask your health care provider what is healthy for you. Here are some general guidelines. Calcium Recommendations Calcium is the most important (essential) mineral for bone health. Most people can get enough calcium from their diet, but supplements may be recommended for people who are at risk for osteoporosis. Good sources of calcium include:  Dairy products, such as low-fat or nonfat milk, cheese, and yogurt.  Dark green leafy vegetables, such as bok choy and broccoli.  Calcium-fortified foods, such as orange juice, cereal, bread, soy beverages, and tofu products.  Nuts, such as almonds.  Follow these recommended amounts for daily calcium intake:  Children, age 1?3: 700 mg.  Children, age 4?8: 1,000 mg.  Children, age 9?13: 1,300 mg.  Teens, age 14?18: 1,300 mg.  Adults, age 19?50: 1,000 mg.  Adults, age 51?70: ? Men: 1,000 mg. ? Women: 1,200 mg.  Adults, age 71 or older: 1,200 mg.  Pregnant and breastfeeding females: ? Teens: 1,300 mg. ? Adults: 1,000 mg.  Vitamin D Recommendations Vitamin D is the most essential vitamin for bone health. It helps the body to absorb calcium. Sunlight stimulates the skin to make vitamin D, so be sure to get enough sunlight. If you live in a cold climate or you do not get outside often, your health care provider may recommend that you take vitamin   D supplements. Good sources of vitamin D in your diet include:  Egg yolks.  Saltwater fish.  Milk and cereal fortified with vitamin D.  Follow these recommended amounts for daily vitamin D intake:  Children and teens, age 1?18: 600 international units.  Adults, age 50 or younger: 400-800 international units.  Adults, age 51 or older: 800-1,000 international units.  Other Nutrients Other nutrients  for bone health include:  Phosphorus. This mineral is found in meat, poultry, dairy foods, nuts, and legumes. The recommended daily intake for adult men and adult women is 700 mg.  Magnesium. This mineral is found in seeds, nuts, dark green vegetables, and legumes. The recommended daily intake for adult men is 400?420 mg. For adult women, it is 310?320 mg.  Vitamin K. This vitamin is found in green leafy vegetables. The recommended daily intake is 120 mg for adult men and 90 mg for adult women.  What type of physical activity is best for building and maintaining healthy bones? Weight-bearing and strength-building activities are important for building and maintaining peak bone mass. Weight-bearing activities cause muscles and bones to work against gravity. Strength-building activities increases muscle strength that supports bones. Weight-bearing and muscle-building activities include:  Walking and hiking.  Jogging and running.  Dancing.  Gym exercises.  Lifting weights.  Tennis and racquetball.  Climbing stairs.  Aerobics.  Adults should get at least 30 minutes of moderate physical activity on most days. Children should get at least 60 minutes of moderate physical activity on most days. Ask your health care provide what type of exercise is best for you. Where can I find more information? For more information, check out the following websites:  National Osteoporosis Foundation: http://nof.org/learn/basics  National Institutes of Health: http://www.niams.nih.gov/Health_Info/Bone/Bone_Health/bone_health_for_life.asp  This information is not intended to replace advice given to you by your health care provider. Make sure you discuss any questions you have with your health care provider. Document Released: 02/02/2004 Document Revised: 06/01/2016 Document Reviewed: 11/17/2014 [employer] Interactive Patient Education  2018 [employer]  

## 2018-06-04 NOTE — Progress Notes (Signed)
[place]  Telephone:([phone]) [phone] Fax:(336) [phone]     ID: ZHURI KRASS DOB: 1976-03-27  MR#: [id number]  ION#:[id number]  Patient Care Team: Burman Freestone, MD as PCP - General (Internal Medicine) Excell Seltzer, MD as Consulting Physician (General Surgery) Magrinat, Virgie Dad, MD as Consulting Physician (Oncology) Gery Pray, MD as Consulting Physician (Radiation Oncology) [place] Crane, MD as Referring Physician (Internal Medicine) Bensimhon, Shaune Pascal, MD as Consulting Physician (Cardiology) OTHER MD:  CHIEF COMPLAINT: Estrogen receptor positive breast cancer  CURRENT TREATMENT:anastrozole, lapatinib   BREAST CANCER HISTORY: From the original intake note:  "[name]" herself noted a change around her nipple on the right and brought it to medical attention. She was set up for bilateral diagnostic mammography with tomography and right breast ultrasonography at [facility] 05/23/2017. The breast density was category C. In the right breast upper inner quadrant there was a 2.5 cm area of grouped pleomorphic calcifications correlating with the palpable mass. Also there was a 1.2 cm macrolobulated mass in the upper inner quadrant also in the palpable region. These masses were identifiable by ultrasound with the same dimensions. The right axilla was sonographically negative.  On 05/27/2017 the patient underwent biopsy of the 2 masses in question. 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  The patient's subsequent history is as detailed below.  INTERVAL  HISTORY: Kiara Brown is here today for evaluation and treatment of her triple positive breast cancer.  She continues on anastrozole, which she tolerates well.   She is also taking Lapatinib and has one week left.  She tolerates this well, but is looking forward to stopping the medication.     REVIEW OF SYSTEMS: Kiara Brown is doing well.  She is getting ready to start water aerobics next week.  She has some issues sleeping at night.  She feels like she has dry mouth, and a carpet like sensation in her mouth.  She denies any other issues today and a detailed ROS is entirely stable.    Since her last visit she underwent a mammogram at [facility].  We have called to request those records.    She also recently saw Dr. Aundra Dubin in cardio oncology.  Her echocardiogram from 06/02/18 showed an EF of 55-60%.    PAST MEDICAL HISTORY: Past Medical History:  Diagnosis Date  . Arthritis    osteo, rheumatoid  . Breast cancer (Kiara Brown)   . Diverticulosis   . Dysrhythmia    hx rapid heartbeat, no cardiologist  . Family history of breast cancer   . Family history of prostate cancer   . Fibromyalgia    peripheral neuropathy  . Fibromyalgia   . GERD (gastroesophageal reflux disease)   . H/O hiatal hernia   . Hyperlipidemia   . PONV (postoperative nausea and vomiting)    "THROAT WITH EXTREME [id number], ulnar nerve surgery  . Sciatica   . Shortness of breath dyspnea    with exertion  . UTI (lower urinary tract infection)     PAST SURGICAL HISTORY: Past Surgical History:  Procedure Laterality Date  . ABDOMINAL HYSTERECTOMY    . APPENDECTOMY    .  back injection      July 2018- x1  . BREAST SURGERY Left 1986   biopsy, Recent 05/2017 at [facility]- had biopsy on R breast- malignant   . CARDIAC CATHETERIZATION  2009   "no blockage per pt"  . CHOLECYSTECTOMY    . CYSTOSCOPY WITH BIOPSY    . INCONTINENCE SURGERY    . IRRIGATION AND DEBRIDEMENT ABSCESS Right 01/28/2018   Procedure: IRRIGATION AND DEBRIDEMENT AXILLARY  ABSCESS;  Surgeon: Excell Seltzer, MD;  Location: [facility];  Service: General;  Laterality: Right;  . JOINT REPLACEMENT    . KNEE ARTHROSCOPY     bilateral  . KNEE ARTHROSCOPY Left 03/17/2014   Procedure: ARTHROSCOPY LEFT KNEE WITH SYNOVECTOMY;  Surgeon: Gearlean Alf, MD;  Location: [facility];  Service: Orthopedics;  Laterality: Left;  . PORTACATH PLACEMENT Left 07/02/2017   Procedure: INSERTION PORT-A-CATH;  Surgeon: Excell Seltzer, MD;  Location: [place];  Service: General;  Laterality: Left;  . RIGHT/LEFT HEART CATH AND CORONARY ANGIOGRAPHY N/A 08/30/2017   Procedure: RIGHT/LEFT HEART CATH AND CORONARY ANGIOGRAPHY;  Surgeon: Larey Dresser, MD;  Location: [place] [facility];  Service: Cardiovascular;  Laterality: N/A;  . ROTATOR CUFF REPAIR     right  . SIMPLE MASTECTOMY WITH AXILLARY SENTINEL NODE BIOPSY Right 07/02/2017   Procedure: RIGHT TOTAL  MASTECTOMY WITH AXILLARY SENTINEL LYMPH  NODE BIOPSY;  Surgeon: Excell Seltzer, MD;  Location: [place];  Service: General;  Laterality: Right;  . TOTAL KNEE ARTHROPLASTY  08/04/2012   Procedure: TOTAL KNEE ARTHROPLASTY;  Surgeon: Gearlean Alf, MD;  Location: [facility];  Service: Orthopedics;  Laterality: Left;  . TOTAL KNEE ARTHROPLASTY Right 04/11/2015   Procedure: RIGHT TOTAL KNEE ARTHROPLASTY;  Surgeon: Gaynelle Arabian, MD;  Location: [facility];  Service: Orthopedics;  Laterality: Right;  . ulnar nerve surgery Right 1986    FAMILY HISTORY Family History  Problem Relation Age of Onset  . Hypertension Mother   . Stroke Mother   . Breast cancer Mother 62       died at 58  . Hypertension Father   . Heart attack Father        died at 65  . Hypertension Brother        MI  . Prostate cancer Brother 57  . Hypertension Brother        MI  . Lung cancer Brother        possible lung cancer, spot found on lung being monitored  . Hypertension Brother        MI  . Non-Hodgkin's lymphoma Sister 59  . Breast cancer Other 57       is currently 90   . Breast cancer Other 55  . Cancer Cousin 62       type of cancer unk.   . Cancer Other        type unknown, eye/face   . Cancer Other 76       died from cancer in his 76's, type unknown   The patient's father died from a heart attack at age 76. The patient's mother was diagnosed with breast cancer at age 51 and died at age 49. The patient had 5 brothers, 3 sisters. One sister had breast cancer at age 39. One brother was diagnosed with prostate cancer at age 35. One Brother died from lung cancer at age 59. One sister had non-Hodgkin's lymphoma diagnosed at age 48.   GYNECOLOGIC HISTORY:  No LMP recorded. Patient has had a hysterectomy.  menarche age 89, first live birth age 49. All 3 of her births were premature including 1 set of twins. One of the twins died a few hours after birth. She underwent hysterectomy in 1981 with unilateral salpingo-oophorectomy. 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  She has led groups as far away as [place] and [place] and has led many in the [place] cruises as well.  Her husband, Berneta Sages began a business installing fences which is now carried on by his son Dorothea Ogle. Berneta Sages is also the church organist. Son, Darnelle Maffucci is a truck Geophysicist/field seismologist in [employer]. The patient has one grandchild. She is a Tourist information centre manager.     ADVANCED DIRECTIVES: In place. The patient's husband is her healthcare power of attorney   HEALTH MAINTENANCE: Social History   Tobacco Use  . Smoking status: Never Smoker  . Smokeless tobacco: Never Used  Substance Use Topics  . Alcohol use: No  . Drug use: No     Colonoscopy:2017 / [place]   PAP:  Bone density:June 2014, normal    Allergies  Allergen Reactions  . Duloxetine Other (See Comments)    UNSPECIFIED REACTION   . Nsaids Rash  . Oseltamivir Diarrhea and Other (See Comments)    Abdominal pain     Current  Outpatient Medications  Medication Sig Dispense Refill  . acetaminophen (TYLENOL) 500 MG tablet Take 500 mg by mouth every 6 (six) hours as needed for moderate pain or headache.     . albuterol (PROVENTIL HFA;VENTOLIN HFA) 108 (90 Base) MCG/ACT inhaler Inhale 2 puffs into the lungs every 6 (six) hours as needed for wheezing or shortness of breath. 1 Inhaler 2  . anastrozole (ARIMIDEX) 1 MG tablet Take 1 tablet (1 mg total) by mouth daily. (Patient taking differently: Take 1 mg by mouth at bedtime. ) 90 tablet 4  . aspirin EC 81 MG tablet Take 81 mg by mouth daily.    Marland Kitchen atenolol (TENORMIN) 50 MG tablet Take 50 mg by mouth daily before breakfast.     . buPROPion (WELLBUTRIN XL) 150 MG 24 hr tablet Take 150 mg by mouth daily before breakfast.    . folic acid (FOLVITE) 1 MG tablet Take 1 mg by mouth daily.    . furosemide (LASIX) 20 MG tablet Take 40 mg by mouth daily.    Marland Kitchen HYDROcodone-acetaminophen (NORCO) 5-325 MG tablet Take 1 tablet by mouth every 4 (four) hours as needed for moderate pain. 30 tablet 0  . lapatinib (TYKERB) 250 MG tablet Take 3 tablets (750 mg total) by mouth daily. Take on an empty stomach, 1 hour before or 2 hours after food. 90 tablet 6  . omeprazole (PRILOSEC) 40 MG capsule Take 40 mg by mouth every morning.    . ondansetron (ZOFRAN) 8 MG tablet Take 1 tablet (8 mg total) by mouth every 8 (eight) hours as needed for nausea or vomiting. 20 tablet 0  . Potassium Chloride ER 20 MEQ TBCR Take 20 mg by mouth daily at 2 PM. 30 tablet 6  . traMADol (ULTRAM) 50 MG tablet Take 50 mg by mouth every 6 (six) hours as needed.    Marland Kitchen atorvastatin (LIPITOR) 80 MG tablet Take 1 tablet (80 mg total) by mouth daily. 30 tablet 3  . isosorbide mononitrate (IMDUR) 30 MG 24 hr tablet Take 0.5 tablets (15 mg total) by mouth daily. 15 tablet 3  No current facility-administered medications for this visit.     OBJECTIVE:   Vitals:   06/04/18 1136  BP: (!) 154/69  Pulse: 85  Resp: 19  Temp:  97.7 F (36.5 C)  SpO2: 100%     Body mass index is 37.3 kg/m.  Domenick Gong Weights   06/04/18 1136  Weight: 245 lb 4.8 oz (111.3 kg)     ECOG FS:2 - Symptomatic, <50% confined to bed GENERAL: Patient is a well appearing female in no acute distress HEENT:  Sclerae anicteric.  Oropharynx clear and moist. No ulcerations or evidence of oropharyngeal candidiasis. Neck is supple.  NODES:  No cervical, supraclavicular, or axillary lymphadenopathy palpated.  BREAST EXAM:  Right breast is surgically absent, no nodules, masses noted, left breast without nodules, masses, skin or nipple changes. LUNGS:  Clear to auscultation bilaterally.  No wheezes or rhonchi. HEART:  Regular rate and rhythm. No murmur appreciated. ABDOMEN:  Soft, nontender.  Positive, normoactive bowel sounds. No organomegaly palpated. MSK:  No focal spinal tenderness to palpation. Full range of motion bilaterally in the upper extremities. EXTREMITIES:  No peripheral edema.   SKIN:  Clear with no obvious rashes or skin changes. No nail dyscrasia. NEURO:  Nonfocal. Well oriented.  Appropriate affect.    LAB RESULTS:  CMP     Component Value Date/Time   NA 135 06/02/2018 1104   NA 131 (L) 09/06/2017 1018   K 3.6 06/02/2018 1104   K 4.2 09/06/2017 1018   CL 101 06/02/2018 1104   CO2 25 06/02/2018 1104   CO2 25 09/06/2017 1018   GLUCOSE 191 (H) 06/02/2018 1104   GLUCOSE 187 (H) 09/06/2017 1018   BUN 18 06/02/2018 1104   BUN 11.2 09/06/2017 1018   CREATININE 1.12 (H) 06/02/2018 1104   CREATININE 1.04 02/21/2018 1139   CREATININE 1.2 (H) 09/06/2017 1018   CALCIUM 9.3 06/02/2018 1104   CALCIUM 9.3 09/06/2017 1018   PROT 7.4 04/02/2018 1414   PROT 6.9 09/06/2017 1018   ALBUMIN 3.4 (L) 04/02/2018 1414   ALBUMIN 3.4 (L) 09/06/2017 1018   AST 16 04/02/2018 1414   AST 20 09/06/2017 1018   ALT 14 04/02/2018 1414   ALT 14 09/06/2017 1018   ALKPHOS 126 04/02/2018 1414   ALKPHOS 103 09/06/2017 1018   BILITOT  0.4 04/02/2018 1414   BILITOT 0.57 09/06/2017 1018   GFRNONAA 46 (L) 06/02/2018 1104   GFRNONAA 51 (L) 02/21/2018 1139   GFRAA 54 (L) 06/02/2018 1104   GFRAA 59 (L) 02/21/2018 1139    No results found for: TOTALPROTELP, ALBUMINELP, A1GS, A2GS, BETS, BETA2SER, GAMS, MSPIKE, SPEI  No results found for: KPAFRELGTCHN, LAMBDASER, KAPLAMBRATIO  Lab Results  Component Value Date   WBC 7.8 06/04/2018   NEUTROABS 4.7 06/04/2018   HGB 9.8 (L) 06/04/2018   HCT 31.2 (L) 06/04/2018   MCV 78.2 (L) 06/04/2018   PLT 332 06/04/2018      Chemistry      Component Value Date/Time   NA 135 06/02/2018 1104   NA 131 (L) 09/06/2017 1018   K 3.6 06/02/2018 1104   K 4.2 09/06/2017 1018   CL 101 06/02/2018 1104   CO2 25 06/02/2018 1104   CO2 25 09/06/2017 1018   BUN 18 06/02/2018 1104   BUN 11.2 09/06/2017 1018   CREATININE 1.12 (H) 06/02/2018 1104   CREATININE 1.04 02/21/2018 1139   CREATININE 1.2 (H) 09/06/2017 1018      Component Value Date/Time   CALCIUM 9.3 06/02/2018  1104   CALCIUM 9.3 09/06/2017 1018   ALKPHOS 126 04/02/2018 1414   ALKPHOS 103 09/06/2017 1018   AST 16 04/02/2018 1414   AST 20 09/06/2017 1018   ALT 14 04/02/2018 1414   ALT 14 09/06/2017 1018   BILITOT 0.4 04/02/2018 1414   BILITOT 0.57 09/06/2017 1018       No results found for: LABCA2  No components found for: [id number]  No results for input(s): INR in the last 168 hours.  Urinalysis    Component Value Date/Time   COLORURINE STRAW (A) 02/21/2018 1138   APPEARANCEUR CLEAR 02/21/2018 1138   LABSPEC 1.006 02/21/2018 1138   PHURINE 6.0 02/21/2018 1138   GLUCOSEU NEGATIVE 02/21/2018 1138   HGBUR SMALL (A) 02/21/2018 1138   BILIRUBINUR NEGATIVE 02/21/2018 1138   KETONESUR NEGATIVE 02/21/2018 1138   PROTEINUR NEGATIVE 02/21/2018 1138   UROBILINOGEN 0.2 04/06/2015 1332   NITRITE NEGATIVE 02/21/2018 1138   LEUKOCYTESUR NEGATIVE 02/21/2018 1138     STUDIES: Next echocardiogram scheduled for early  July.  No results found.  ELIGIBLE FOR AVAILABLE RESEARCH PROTOCOL: No  ASSESSMENT: 77 y.o. [place] woman status post right breast upper inner quadrant biopsy 2 on 05/27/2017 showing a clinical T1c N0, stage IA invasive ductal carcinoma, grade 3, estrogen and progesterone receptor positive, HER-2 amplified, with an MIB-1 of 60%  (1) genetics testing 07/14/2017 through the Multi-Cancer panel + Breast Cancer Panel found no deleterious mutations in  ALK, APC,AKT1, ATM, AXIN2,BAP1,  BARD1, BLM, BMPR1A, BRCA1, BRCA2, BRIP1, CASR, CDC73, CDH1, CDK4, CDKN1B, CDKN1C, CDKN2A (p14ARF), CDKN2A (p16INK4a), CEBPA, CHEK2, CTNNA1, DICER1, DIS3L2, EGFR (c.2369C>T, p.Thr790Met variant only), EPCAM (Deletion/duplication testing only), FH, FAM175A, FLCN, GATA2, GPC3, GREM1 (Promoter region deletion/duplication testing only), HOXB13 (c.251G>A, p.Gly84Glu), HRAS, KIT, MAX, MEN1, MET, MITF (c.952G>A, p.Glu318Lys variant only), MLH1, MSH2, MSH3, MSH6, MUTYH, NBN, NF1, NF2, NTHL1, PALB2, PDGFRA, PHOX2B, PMS2, POLD1, POLE, POT1, PRKAR1A, PTCH1, PTEN, RAD50, RAD51C, RAD51D, RB1, RECQL4, RET, RUNX1, SDHAF2, SDHA (sequence changes only), SDHB, SDHC, SDHD, SMAD4, SMARCA4, SMARCB1, SMARCE1, STK11, SUFU, TERC, TERT, TMEM127, TP53, TSC1, TSC2, VHL, WRN, WT1, FANCC,MRE11,PIK3CA,RINT1,XRCC2.  (a)  3 Variants of Uncertain significance (VUS) were identified.  BRCA1 c.2551G>A (p.Glu851Lys)----CHEK2 c.679G>A (p.Gly227Arg).  The CHECK 2 variant has been reported as possibly mosaic--- SDHA c.704T>C (p.Ile235Thr).  (2) anastrozole started 06/05/2017, to be continued and minimum of 5 years   (3) status post right mastectomy and sentinel lymph node sampling 07/02/2017 for an mpT2 pN0, stage IB invasive ductal carcinoma, grade 3, with negative margins.  (a) the patient opted against reconstruction  (4) trastuzumab for 6 months started 08/09/2017, discontinued after one dose with significant reaction  (a) baseline echocardiogram  06/19/2017 shows an excellent ejection fraction  (b) Echocardiogram 09/16/2017 showed an ejection fraction of 60-65%  (5) started lapatinib 12/02/2017-- to be continued 6 months  (a) echocardiogram 01/21/2018 shows an ejection fraction in the 60-65%. PLAN: Kiara Brown is doing well today.  She has no sign of recurrence and this is good.  She will complete six months of Lapatinib next week and will stop once she finishes the rest of her pills.  She will continue taking Anastrozole as she has good tolerance with this medication.    I have requested a copy of her mammogram be sent to [place] from [place].    [name] and I reviewed the risk of decreased bone density while taking Anastrozole.  She will need a bone density test done.  She will talk to Dr. Nori Riis next week about getting one done while  there and will have the results sent to [place].  I gave her a detailed handout in her AVS regarding calcium and vitamin d intake, weight bearing exercises, and resources for addition bone health information.    Kiara Brown has a new microcytic anemia.  She does not note any blood in her stool or black tarry stool.  I recommended that she get a slow iron supplement OTC and take it twice a day.  We will add on a ferritin and TIBC today with lab, we will also refer to GI for a work up.    I reviewed this with Dr. Jana Hakim in detail who is in agreement and helped formulate the plan.  She will return in 3 months for follow-up.  She knows to call for any other issues that may develop before the next visit.  A total of (30) minutes of face-to-face time was spent with this patient with greater than 50% of that time in counseling and care-coordination.    Wilber Bihari, NP  06/04/18 11:39 AM Medical Oncology and Hematology [facility] [address] [place],  [address] Tel. [phone]    Fax. [phone]

## 2018-06-10 ENCOUNTER — Encounter: Payer: Self-pay | Admitting: Gastroenterology

## 2018-06-10 ENCOUNTER — Ambulatory Visit (INDEPENDENT_AMBULATORY_CARE_PROVIDER_SITE_OTHER): Payer: Medicare Other | Admitting: Gastroenterology

## 2018-06-10 VITALS — BP 132/68 | HR 84 | Ht 64.0 in | Wt 244.4 lb

## 2018-06-10 DIAGNOSIS — I209 Angina pectoris, unspecified: Secondary | ICD-10-CM | POA: Diagnosis not present

## 2018-06-10 DIAGNOSIS — D508 Other iron deficiency anemias: Secondary | ICD-10-CM | POA: Diagnosis not present

## 2018-06-10 MED ORDER — FERROUS SULFATE 325 (65 FE) MG PO TABS: 325.0000 mg | ORAL_TABLET | Freq: Two times a day (BID) | ORAL | 3 refills | Status: DC

## 2018-06-10 NOTE — Patient Instructions (Signed)
You have been scheduled for an endoscopy. Please follow written instructions given to you at your visit today. If you use inhalers (even only as needed), please bring them with you on the day of your procedure. Your physician has requested that you go to www.[email or web address] and enter the access code given to you at your visit today. This web site gives a general overview about your procedure. However, you should still follow specific instructions given to you by our office regarding your preparation for the procedure.  We will try and obtain your previous GI records  Please do the stool hemoccults and mail back to the lab  Follow up as needed   If you are age 17 or older, your body mass index should be between 23-30. Your Body mass index is 41.95 kg/m. If this is out of the aforementioned range listed, please consider follow up with your Primary Care Provider.  If you are age 56 or younger, your body mass index should be between 19-25. Your Body mass index is 41.95 kg/m. If this is out of the aformentioned range listed, please consider follow up with your Primary Care Provider.

## 2018-06-10 NOTE — Progress Notes (Addendum)
Kiara Brown    [id number]    08-11-1941  Primary Care Physician:Woodyear, [name], MD  Referring Physician: Gardenia Phlegm, NP [place], [place] [address]  Chief complaint: Iron deficiency anemia HPI:  77 year old female with history of breast cancer referred by oncology for evaluation of iron deficiency anemia. Patient complains of fatigue and shortness of breath.  Echocardiogram July 2019 showed EF 35 to 60% normal RV size and systolic function.  Status post left heart cath October 2018 with 70% proximal LAD stenosis .  Chronic diastolic CHF  is on Lasix, Imdur and beta-blocker. Recent labs June 04, 2018 showed ferritin of 19, iron 23 and iron saturation ratio 6.  Hemoglobin 9.8 and hematocrit 31.2.  On review of labs patient had chronic anemia with hemoglobin ranging from 9-10 in the past 1 year.  No previous iron studies or ferritin level. Patient reports that she had colonoscopy and endoscopy at [facility] about 2 years ago and was normal.  Records not available during this visit to review. Denies any melena or blood per rectum.  No dysphagia, odynophagia, nausea, abdominal pain, vomiting or change in bowel habits. No history of frequent NSAID use. No family history of colon cancer or GI malignancy  Outpatient Encounter Medications as of 06/10/2018  Medication Sig  . acetaminophen (TYLENOL) 500 MG tablet Take 500 mg by mouth every 6 (six) hours as needed for moderate pain or headache.   . albuterol (PROVENTIL HFA;VENTOLIN HFA) 108 (90 Base) MCG/ACT inhaler Inhale 2 puffs into the lungs every 6 (six) hours as needed for wheezing or shortness of breath.  . anastrozole (ARIMIDEX) 1 MG tablet Take 1 tablet (1 mg total) by mouth daily. (Patient taking differently: Take 1 mg by mouth at bedtime. )  . aspirin EC 81 MG tablet Take 81 mg by mouth daily.  Marland Kitchen atenolol (TENORMIN) 50 MG tablet Take 50 mg by mouth daily before breakfast.   .  buPROPion (WELLBUTRIN XL) 150 MG 24 hr tablet Take 150 mg by mouth daily before breakfast.  . fluconazole (DIFLUCAN) 150 MG tablet Take 1 tablet (150 mg total) by mouth daily.  . folic acid (FOLVITE) 1 MG tablet Take 1 mg by mouth daily.  . furosemide (LASIX) 20 MG tablet Take 40 mg by mouth daily.  Marland Kitchen HYDROcodone-acetaminophen (NORCO) 5-325 MG tablet Take 1 tablet by mouth every 4 (four) hours as needed for moderate pain.  . lapatinib (TYKERB) 250 MG tablet Take 3 tablets (750 mg total) by mouth daily. Take on an empty stomach, 1 hour before or 2 hours after food.  Marland Kitchen omeprazole (PRILOSEC) 40 MG capsule Take 40 mg by mouth every morning.  . ondansetron (ZOFRAN) 8 MG tablet Take 1 tablet (8 mg total) by mouth every 8 (eight) hours as needed for nausea or vomiting.  . Potassium Chloride ER 20 MEQ TBCR Take 20 mg by mouth daily at 2 PM.  . traMADol (ULTRAM) 50 MG tablet Take 50 mg by mouth every 6 (six) hours as needed.  Marland Kitchen atorvastatin (LIPITOR) 80 MG tablet Take 1 tablet (80 mg total) by mouth daily.  . isosorbide mononitrate (IMDUR) 30 MG 24 hr tablet Take 0.5 tablets (15 mg total) by mouth daily.   No facility-administered encounter medications on file as of 06/10/2018.     Allergies as of 06/10/2018 - Review Complete 06/10/2018  Allergen Reaction Noted  . Duloxetine Other (See Comments) 02/02/2016  .  Nsaids Rash   . Oseltamivir Diarrhea and Other (See Comments) 02/02/2016    Past Medical History:  Diagnosis Date  . Arthritis    osteo, rheumatoid  . Breast cancer ([place])   . Diverticulosis   . Dysrhythmia    hx rapid heartbeat, no cardiologist  . Family history of breast cancer   . Family history of prostate cancer   . Fibromyalgia    peripheral neuropathy  . Fibromyalgia   . GERD (gastroesophageal reflux disease)   . H/O hiatal hernia   . Hyperlipidemia   . PONV (postoperative nausea and vomiting)    "THROAT WITH EXTREME FAOZHYQ"6578, ulnar nerve surgery  . Sciatica   .  Shortness of breath dyspnea    with exertion  . UTI (lower urinary tract infection)     Past Surgical History:  Procedure Laterality Date  . ABDOMINAL HYSTERECTOMY    . APPENDECTOMY    . back injection      July 2018- x1  . BREAST SURGERY Left 1986   biopsy, Recent 05/2017 at [facility]- had biopsy on R breast- malignant   . CARDIAC CATHETERIZATION  2009   "no blockage per pt"  . CHOLECYSTECTOMY    . CYSTOSCOPY WITH BIOPSY    . INCONTINENCE SURGERY    . IRRIGATION AND DEBRIDEMENT ABSCESS Right 01/28/2018   Procedure: IRRIGATION AND DEBRIDEMENT AXILLARY ABSCESS;  Surgeon: Excell Seltzer, MD;  Location: [facility];  Service: General;  Laterality: Right;  . JOINT REPLACEMENT    . KNEE ARTHROSCOPY     bilateral  . KNEE ARTHROSCOPY Left 03/17/2014   Procedure: ARTHROSCOPY LEFT KNEE WITH SYNOVECTOMY;  Surgeon: Gearlean Alf, MD;  Location: [facility];  Service: Orthopedics;  Laterality: Left;  . PORTACATH PLACEMENT Left 07/02/2017   Procedure: INSERTION PORT-A-CATH;  Surgeon: Excell Seltzer, MD;  Location: [place];  Service: General;  Laterality: Left;  . RIGHT/LEFT HEART CATH AND CORONARY ANGIOGRAPHY N/A 08/30/2017   Procedure: RIGHT/LEFT HEART CATH AND CORONARY ANGIOGRAPHY;  Surgeon: Larey Dresser, MD;  Location: [place] [facility];  Service: Cardiovascular;  Laterality: N/A;  . ROTATOR CUFF REPAIR     right  . SIMPLE MASTECTOMY WITH AXILLARY SENTINEL NODE BIOPSY Right 07/02/2017   Procedure: RIGHT TOTAL  MASTECTOMY WITH AXILLARY SENTINEL LYMPH  NODE BIOPSY;  Surgeon: Excell Seltzer, MD;  Location: [facility];  Service: General;  Laterality: Right;  . TOTAL KNEE ARTHROPLASTY  08/04/2012   Procedure: TOTAL KNEE ARTHROPLASTY;  Surgeon: Gearlean Alf, MD;  Location: [facility];  Service: Orthopedics;  Laterality: Left;  . TOTAL KNEE ARTHROPLASTY Right 04/11/2015   Procedure: RIGHT TOTAL KNEE ARTHROPLASTY;  Surgeon: Gaynelle Arabian, MD;  Location: [facility];  Service: Orthopedics;  Laterality: Right;  . ulnar  nerve surgery Right 1986    Family History  Problem Relation Age of Onset  . Hypertension Mother   . Stroke Mother   . Breast cancer Mother 66       died at 36  . Hypertension Father   . Heart attack Father        died at 52  . Hypertension Brother        MI  . Prostate cancer Brother 75  . Hypertension Brother        MI  . Lung cancer Brother        possible lung cancer, spot found on lung being monitored  . Hypertension Brother        MI  . Non-Hodgkin's lymphoma Sister 64  .  Breast cancer Other 49       is currently 90  . Breast cancer Other 55  . Cancer Cousin 62       type of cancer unk.   . Cancer Other        type unknown, eye/face   . Cancer Other 63       died from cancer in his 69's, type unknown    Social History   Socioeconomic History  . Marital status: Married    Spouse name: Not on file  . Number of children: Not on file  . Years of education: Not on file  . Highest education level: Not on file  Occupational History  . Not on file  Social Needs  . Financial resource strain: Not on file  . Food insecurity:    Worry: Not on file    Inability: Not on file  . Transportation needs:    Medical: Not on file    Non-medical: Not on file  Tobacco Use  . Smoking status: Never Smoker  . Smokeless tobacco: Never Used  Substance and Sexual Activity  . Alcohol use: No  . Drug use: No  . Sexual activity: Not on file  Lifestyle  . Physical activity:    Days per week: Not on file    Minutes per session: Not on file  . Stress: Not on file  Relationships  . Social connections:    Talks on phone: Not on file    Gets together: Not on file    Attends religious service: Not on file    Active member of club or organization: Not on file    Attends meetings of clubs or organizations: Not on file    Relationship status: Not on file  . Intimate partner violence:    Fear of current or ex partner: Not on file    Emotionally abused: Not on file    Physically  abused: Not on file    Forced sexual activity: Not on file  Other Topics Concern  . Not on file  Social History Narrative  . Not on file      Review of systems: Review of Systems  Constitutional: Negative for fever and chills.  HENT: Negative.   Eyes: Negative for blurred vision.  Respiratory: Negative for cough, shortness of breath and wheezing.   Cardiovascular: Negative for chest pain and palpitations.  Gastrointestinal: as per HPI Genitourinary: Negative for dysuria, urgency, frequency and hematuria.  Musculoskeletal: Negative for myalgias, back pain and joint pain.  Skin: Negative for itching and rash.  Neurological: Negative for dizziness, tremors, focal weakness, seizures and loss of consciousness.  Endo/Heme/Allergies: Positive for seasonal allergies.  Psychiatric/Behavioral: Negative for depression, suicidal ideas and hallucinations.  All other systems reviewed and are negative.   Physical Exam: Vitals:   06/10/18 0917  BP: 132/68  Pulse: 84   Body mass index is 41.95 kg/m. Gen:      No acute distress HEENT:  EOMI, sclera anicteric Neck:     No masses; no thyromegaly Lungs:    Clear to auscultation bilaterally; normal respiratory effort CV:         Regular rate and rhythm; no murmurs Abd:      + bowel sounds; soft, non-tender; no palpable masses, no distension Ext:    No edema; adequate peripheral perfusion Skin:      Warm and dry; no rash Neuro: alert and oriented x 3 Psych: normal mood and affect  Data Reviewed:  Reviewed labs,  radiology imaging, old records and pertinent past GI work up   Assessment and Plan/Recommendations:  77 year old female with history of breast cancer, hyperlipidemia, CAD, diastolic CHF referred for evaluation of iron deficiency anemia Patient denies any overt GI We will schedule for EGD to evaluate potential GI source for blood loss According to patient she had colonoscopy 2 years ago, will obtain records from previous GI  office Requested fecal Hemoccult stool cards to check for occult GI blood loss If EGD unremarkable, will consider small bowel video capsule to further evaluate Continue oral iron supplements  The risks and benefits as well as alternatives of endoscopic procedure(s) have been discussed and reviewed. All questions answered. The patient agrees to proceed.  Damaris Hippo , MD  [phone]    CC: Deanne Coffer*   Addendum:  Received records from [facility] EGD September 02, 2013 normal exam Colonoscopy September 02, 2013 good prep, a 2 mm polyp was removed, left-sided diverticulosis Pathology report showed polypoid granulation tissue with chronic active inflammation with no evidence of adenoma.  Recall colonoscopy in 10 years

## 2018-06-11 DIAGNOSIS — Z01419 Encounter for gynecological examination (general) (routine) without abnormal findings: Secondary | ICD-10-CM | POA: Diagnosis not present

## 2018-06-11 DIAGNOSIS — Z6841 Body Mass Index (BMI) 40.0 and over, adult: Secondary | ICD-10-CM | POA: Diagnosis not present

## 2018-06-12 ENCOUNTER — Encounter: Payer: Self-pay | Admitting: Gastroenterology

## 2018-06-13 DIAGNOSIS — N958 Other specified menopausal and perimenopausal disorders: Secondary | ICD-10-CM | POA: Diagnosis not present

## 2018-06-16 ENCOUNTER — Other Ambulatory Visit (INDEPENDENT_AMBULATORY_CARE_PROVIDER_SITE_OTHER): Payer: Medicare Other

## 2018-06-16 ENCOUNTER — Other Ambulatory Visit: Payer: Self-pay | Admitting: *Deleted

## 2018-06-16 DIAGNOSIS — D508 Other iron deficiency anemias: Secondary | ICD-10-CM

## 2018-06-16 LAB — HEMOCCULT SLIDES (X 3 CARDS)
FECAL OCCULT BLD: NEGATIVE
OCCULT 1: NEGATIVE
OCCULT 2: NEGATIVE
OCCULT 3: NEGATIVE
OCCULT 4: NEGATIVE
OCCULT 5: NEGATIVE

## 2018-06-27 ENCOUNTER — Telehealth: Payer: Self-pay | Admitting: *Deleted

## 2018-06-27 ENCOUNTER — Ambulatory Visit (AMBULATORY_SURGERY_CENTER): Payer: Medicare Other | Admitting: Gastroenterology

## 2018-06-27 ENCOUNTER — Encounter: Payer: Self-pay | Admitting: Gastroenterology

## 2018-06-27 VITALS — BP 118/58 | HR 69 | Temp 97.5°F | Resp 19 | Ht 64.0 in | Wt 244.0 lb

## 2018-06-27 DIAGNOSIS — K3189 Other diseases of stomach and duodenum: Secondary | ICD-10-CM

## 2018-06-27 DIAGNOSIS — K219 Gastro-esophageal reflux disease without esophagitis: Secondary | ICD-10-CM | POA: Diagnosis not present

## 2018-06-27 DIAGNOSIS — D508 Other iron deficiency anemias: Secondary | ICD-10-CM | POA: Diagnosis not present

## 2018-06-27 DIAGNOSIS — K2971 Gastritis, unspecified, with bleeding: Secondary | ICD-10-CM

## 2018-06-27 DIAGNOSIS — K2950 Unspecified chronic gastritis without bleeding: Secondary | ICD-10-CM | POA: Diagnosis not present

## 2018-06-27 MED ORDER — SODIUM CHLORIDE 0.9 % IV SOLN: 500.0000 mL | Freq: Once | INTRAVENOUS | Status: DC

## 2018-06-27 NOTE — Progress Notes (Signed)
Pt's states no medical or surgical changes since previsit or office visit. 

## 2018-06-27 NOTE — Progress Notes (Signed)
Called to room to assist during endoscopic procedure.  Patient ID and intended procedure confirmed with present staff. Received instructions for my participation in the procedure from the performing physician.  

## 2018-06-27 NOTE — Op Note (Signed)
[facility] Patient Name: Kiara Brown Procedure Date: 06/27/2018 10:37 AM MRN: [id number] Endoscopist: Mauri Pole , MD Age: 77 Referring MD:  Date of Birth: February 17, 1941 Gender: Female Account #: [id number] Procedure:                Upper GI endoscopy Indications:              Suspected upper gastrointestinal bleeding in                            patient with unexplained iron deficiency anemia,                            Iron deficiency anemia with no gastrointestinal                            bleeding source identified during previous                            colonoscopy Medicines:                Monitored Anesthesia Care Procedure:                Pre-Anesthesia Assessment:                           - Prior to the procedure, a History and Physical                            was performed, and patient medications and                            allergies were reviewed. The patient's tolerance of                            previous anesthesia was also reviewed. The risks                            and benefits of the procedure and the sedation                            options and risks were discussed with the patient.                            All questions were answered, and informed consent                            was obtained. Prior Anticoagulants: The patient has                            taken no previous anticoagulant or antiplatelet                            agents. ASA Grade Assessment: II - A patient with  mild systemic disease. After reviewing the risks                            and benefits, the patient was deemed in                            satisfactory condition to undergo the procedure.                           After obtaining informed consent, the endoscope was                            passed under direct vision. Throughout the                            procedure, the patient's blood pressure, pulse, and                          oxygen saturations were monitored continuously. The                            Model GIF-HQ190 [id number]) scope was introduced                            through the mouth, and advanced to the second part                            of duodenum. The upper GI endoscopy was                            accomplished without difficulty. The patient                            tolerated the procedure well. Scope In: Scope Out: Findings:                 The esophagus was normal.                           Striped mildly erythematous mucosa without bleeding                            was found in the gastric body and in the gastric                            antrum. Biopsies were taken with a cold forceps for                            histology.                           The examined duodenum was normal. Complications:            No immediate complications. Estimated Blood Loss:     Estimated blood loss was minimal. Impression:               -  Normal esophagus.                           - Erythematous mucosa in the gastric body and                            antrum. Biopsied.                           - Normal examined duodenum. Recommendation:           - No high dose aspirin, ibuprofen, naproxen, or                            other non-steroidal anti-inflammatory drugs.                           - Resume previous diet.                           - Continue present medications.                           - Await pathology results. Mauri Pole, MD 06/27/2018 10:52:29 AM This report has been signed electronically.

## 2018-06-27 NOTE — Telephone Encounter (Signed)
No entry 

## 2018-06-27 NOTE — Patient Instructions (Signed)
*   handout on gastritis given *, no aspirin, ibuprofen,naproxen, or other non steroidal anti inflammatory drugs*  YOU HAD AN ENDOSCOPIC PROCEDURE TODAY AT [facility]:   Refer to the procedure report that was given to you for any specific questions about what was found during the examination.  If the procedure report does not answer your questions, please call your gastroenterologist to clarify.  If you requested that your care partner not be given the details of your procedure findings, then the procedure report has been included in a sealed envelope for you to review at your convenience later.  YOU SHOULD EXPECT: Some feelings of bloating in the abdomen. Passage of more gas than usual.  Walking can help get rid of the air that was put into your GI tract during the procedure and reduce the bloating. If you had a lower endoscopy (such as a colonoscopy or flexible sigmoidoscopy) you may notice spotting of blood in your stool or on the toilet paper. If you underwent a bowel prep for your procedure, you may not have a normal bowel movement for a few days.  Please Note:  You might notice some irritation and congestion in your nose or some drainage.  This is from the oxygen used during your procedure.  There is no need for concern and it should clear up in a day or so.  SYMPTOMS TO REPORT IMMEDIATELY:   Following upper endoscopy (EGD)  Vomiting of blood or coffee ground material  New chest pain or pain under the shoulder blades  Painful or persistently difficult swallowing  New shortness of breath  Fever of 100F or higher  Black, tarry-looking stools  For urgent or emergent issues, a gastroenterologist can be reached at any hour by calling [phone].   DIET:  We do recommend a small meal at first, but then you may proceed to your regular diet.  Drink plenty of fluids but you should avoid alcoholic beverages for 24 hours.  ACTIVITY:  You should plan to take it easy for the  rest of today and you should NOT DRIVE or use heavy machinery until tomorrow (because of the sedation medicines used during the test).    FOLLOW UP: Our staff will call the number listed on your records the next business day following your procedure to check on you and address any questions or concerns that you may have regarding the information given to you following your procedure. If we do not reach you, we will leave a message.  However, if you are feeling well and you are not experiencing any problems, there is no need to return our call.  We will assume that you have returned to your regular daily activities without incident.  If any biopsies were taken you will be contacted by phone or by letter within the next 1-3 weeks.  Please call us at [phone] if you have not heard about the biopsies in 3 weeks.    SIGNATURES/CONFIDENTIALITY: You and/or your care partner have signed paperwork which will be entered into your electronic medical record.  These signatures attest to the fact that that the information above on your After Visit Summary has been reviewed and is understood.  Full responsibility of the confidentiality of this discharge information lies with you and/or your care-partner.

## 2018-06-30 ENCOUNTER — Telehealth: Payer: Self-pay | Admitting: *Deleted

## 2018-06-30 NOTE — Telephone Encounter (Signed)
  Follow up Call-  Call back number 06/27/2018  Post procedure Call Back phone  # [phone]  Permission to leave phone message Yes  Some recent data might be hidden     Patient questions:  Do you have a fever, pain , or abdominal swelling? No. Pain Score  0 *  Have you tolerated food without any problems? Yes.    Have you been able to return to your normal activities? Yes.    Do you have any questions about your discharge instructions: Diet   No. Medications  No. Follow up visit  No.  Do you have questions or concerns about your Care? No.  Actions: * If pain score is 4 or above: No action needed, pain <4.

## 2018-07-02 ENCOUNTER — Encounter: Payer: Self-pay | Admitting: Gastroenterology

## 2018-08-06 ENCOUNTER — Other Ambulatory Visit (HOSPITAL_COMMUNITY): Payer: Self-pay | Admitting: *Deleted

## 2018-08-06 ENCOUNTER — Ambulatory Visit (HOSPITAL_COMMUNITY)
Admission: RE | Admit: 2018-08-06 | Discharge: 2018-08-06 | Disposition: A | Discharge status: Home / Self Care | Payer: Medicare Other | Source: Ambulatory Visit | Attending: Cardiology | Admitting: Cardiology

## 2018-08-06 DIAGNOSIS — I5032 Chronic diastolic (congestive) heart failure: Secondary | ICD-10-CM | POA: Insufficient documentation

## 2018-08-06 LAB — LIPID PANEL
Cholesterol: 170 mg/dL (ref 0–200)
HDL: 36 mg/dL — ABNORMAL LOW (ref >40)
LDL CALC: 104 mg/dL — AB (ref 0–99)
TRIGLYCERIDES: 149 mg/dL (ref <150)
Total CHOL/HDL Ratio: 4.7 RATIO
VLDL: 30 mg/dL (ref 0–40)

## 2018-08-06 LAB — HEPATIC FUNCTION PANEL
ALBUMIN: 3.4 g/dL — AB (ref 3.5–5.0)
ALT: 19 U/L (ref 0–44)
AST: 24 U/L (ref 15–41)
Alkaline Phosphatase: 119 U/L (ref 38–126)
BILIRUBIN TOTAL: 0.6 mg/dL (ref 0.3–1.2)
Bilirubin, Direct: 0.1 mg/dL (ref 0.0–0.2)
Indirect Bilirubin: 0.5 mg/dL (ref 0.3–0.9)
Total Protein: 6.8 g/dL (ref 6.5–8.1)

## 2018-08-08 ENCOUNTER — Telehealth (HOSPITAL_COMMUNITY): Payer: Self-pay | Admitting: *Deleted

## 2018-08-08 DIAGNOSIS — I5032 Chronic diastolic (congestive) heart failure: Secondary | ICD-10-CM

## 2018-08-08 MED ORDER — ROSUVASTATIN CALCIUM 40 MG PO TABS: 40.0000 mg | ORAL_TABLET | Freq: Every day | ORAL | 3 refills | Status: DC

## 2018-08-08 NOTE — Telephone Encounter (Signed)
Result Notes for Lipid Profile   Notes recorded by Darron Doom, RN on 08/08/2018 at 10:19 AM EDT Spoke with patient and she's agreeable with plan. Atorvastatin stopped, Crestor ordered, labs ordered, and appt scheduled. Patient requested a copy of labs, will mail out today. ------  Notes recorded by Larey Dresser, MD on 08/07/2018 at 9:32 PM EDT LDL is too high given CAD. If she is taking atorvastatin 80 mg daily, stop atorvastatin and start Crestor 40 mg daily. Lipids/LFTs in 2 months.

## 2018-08-18 DIAGNOSIS — M0609 Rheumatoid arthritis without rheumatoid factor, multiple sites: Secondary | ICD-10-CM | POA: Diagnosis not present

## 2018-08-19 DIAGNOSIS — G2581 Restless legs syndrome: Secondary | ICD-10-CM | POA: Diagnosis not present

## 2018-08-19 DIAGNOSIS — M0579 Rheumatoid arthritis with rheumatoid factor of multiple sites without organ or systems involvement: Secondary | ICD-10-CM | POA: Diagnosis not present

## 2018-08-19 DIAGNOSIS — I2510 Atherosclerotic heart disease of native coronary artery without angina pectoris: Secondary | ICD-10-CM | POA: Diagnosis not present

## 2018-08-19 DIAGNOSIS — F5101 Primary insomnia: Secondary | ICD-10-CM | POA: Diagnosis not present

## 2018-08-19 DIAGNOSIS — R739 Hyperglycemia, unspecified: Secondary | ICD-10-CM | POA: Diagnosis not present

## 2018-08-19 DIAGNOSIS — I119 Hypertensive heart disease without heart failure: Secondary | ICD-10-CM | POA: Diagnosis not present

## 2018-08-19 DIAGNOSIS — I5032 Chronic diastolic (congestive) heart failure: Secondary | ICD-10-CM | POA: Diagnosis not present

## 2018-08-19 DIAGNOSIS — C50211 Malignant neoplasm of upper-inner quadrant of right female breast: Secondary | ICD-10-CM | POA: Diagnosis not present

## 2018-08-19 DIAGNOSIS — I10 Essential (primary) hypertension: Secondary | ICD-10-CM | POA: Diagnosis not present

## 2018-08-19 DIAGNOSIS — Z171 Estrogen receptor negative status [ER-]: Secondary | ICD-10-CM | POA: Diagnosis not present

## 2018-08-19 DIAGNOSIS — R0602 Shortness of breath: Secondary | ICD-10-CM | POA: Diagnosis not present

## 2018-08-25 NOTE — Progress Notes (Signed)
[place]  [phone] [phone]     ID: Kiara Brown DOB: [birth year]  MR#: [id number]  JEH#:[id number]  Patient Care Team: Burman Freestone, MD as PCP - General (Internal Medicine) Excell Seltzer, MD as Consulting Physician (General Surgery) Lukas Pelcher, Virgie Dad, MD as Consulting Physician (Oncology) Gery Pray, MD as Consulting Physician (Radiation Oncology) Nevada Crane, MD as Referring Physician (Internal Medicine) Bensimhon, Shaune Pascal, MD as Consulting Physician (Cardiology) OTHER MD:  CHIEF COMPLAINT: Estrogen receptor positive breast cancer  CURRENT TREATMENT:anastrozole   BREAST CANCER HISTORY: From the original intake note:  "Kiara Brown" herself noted a change around her nipple on the right and brought it to medical attention. She was set up for bilateral diagnostic mammography with tomography and right breast ultrasonography at [facility] 05/23/2017. The breast density was category C. In the right breast upper inner quadrant there was a 2.5 cm area of grouped pleomorphic calcifications correlating with the palpable mass. Also there was a 1.2 cm macrolobulated mass in the upper inner quadrant also in the palpable region. These masses were identifiable by ultrasound with the same dimensions. The right axilla was sonographically negative.  On 05/27/2017 the patient underwent biopsy of the 2 masses in question. 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  The patient's subsequent history is as detailed below.  INTERVAL HISTORY: Kiara Brown is here  today for evaluation and treatment of her triple positive breast cancer. She continues on anastrozole, with good tolerance. She denies issues with hot flashes or vaginal dryness. She pays $30 for a 35-monthsupply.   She completed taking 6 months of lapatinib in July 2019.  Echocardiogram 06/02/2018 showed an ejection fraction of 55-60%  She [facility] routine [facility] with [facility] category C. There was no evidence of malignancy.   REVIEW OF SYSTEMS: PSteward Dronereports that she is having trouble sleeping. She sleeps about 1-2 hours per night. She doesn't take naps during the day. She also takes iron supplements, and she tolerates this well. She denies unusual headaches, visual changes, nausea, vomiting, or dizziness. There has been no unusual cough, phlegm production, or pleurisy. There has been no change in bowel or bladder habits. She denies unexplained fatigue or unexplained weight loss, bleeding, rash, or fever. A detailed review of systems was otherwise stable.    PAST MEDICAL HISTORY: Past Medical History:  Diagnosis Date  . Arthritis    osteo, rheumatoid  . [facility]   . Diverticulosis   . Dysrhythmia    hx rapid heartbeat, no cardiologist  . Family history of breast cancer   . Family history of prostate cancer   . Fibromyalgia    peripheral neuropathy  . Fibromyalgia   . GERD (gastroesophageal reflux disease)   . H/O hiatal hernia   . Hyperlipidemia   . PONV (postoperative nausea and vomiting)    "THROAT WITH EXTREME BVZCHYIF"0277 ulnar nerve surgery  . Sciatica   . Shortness of breath dyspnea    with exertion  . UTI (lower urinary tract infection)     PAST  SURGICAL HISTORY: Past Surgical History:  Procedure Laterality Date  . ABDOMINAL HYSTERECTOMY    . APPENDECTOMY    . back injection      July 2018- x1  . BREAST SURGERY Left 1986   biopsy, Recent 05/2017 [facility]-  had biopsy on R breast- malignant   . CARDIAC CATHETERIZATION  2009   "no blockage per pt"  . CHOLECYSTECTOMY    . CYSTOSCOPY WITH BIOPSY    . INCONTINENCE SURGERY    . IRRIGATION AND DEBRIDEMENT ABSCESS Right 01/28/2018   Procedure: IRRIGATION AND DEBRIDEMENT AXILLARY ABSCESS;  Surgeon: Excell Seltzer, MD;  Location: [facility];  Service: General;  Laterality: Right;  . JOINT REPLACEMENT    . KNEE ARTHROSCOPY     bilateral  . KNEE ARTHROSCOPY Left 03/17/2014   Procedure: ARTHROSCOPY LEFT KNEE WITH SYNOVECTOMY;  Surgeon: Gearlean Alf, MD;  Location: [facility];  Service: Orthopedics;  Laterality: Left;  . PORTACATH PLACEMENT Left 07/02/2017   Procedure: INSERTION PORT-A-CATH;  Surgeon: Excell Seltzer, MD;  Location: [place];  Service: General;  Laterality: Left;  . RIGHT/LEFT HEART CATH AND CORONARY ANGIOGRAPHY N/A 08/30/2017   Procedure: RIGHT/LEFT HEART CATH AND CORONARY ANGIOGRAPHY;  Surgeon: Larey Dresser, MD;  Location: [place] [facility];  Service: Cardiovascular;  Laterality: N/A;  . ROTATOR CUFF REPAIR     right  . SIMPLE MASTECTOMY WITH AXILLARY SENTINEL NODE BIOPSY Right 07/02/2017   Procedure: RIGHT TOTAL  MASTECTOMY WITH AXILLARY SENTINEL LYMPH  NODE BIOPSY;  Surgeon: Excell Seltzer, MD;  Location: [place];  Service: General;  Laterality: Right;  . TOTAL KNEE ARTHROPLASTY  08/04/2012   Procedure: TOTAL KNEE ARTHROPLASTY;  Surgeon: Gearlean Alf, MD;  Location: [facility];  Service: Orthopedics;  Laterality: Left;  . TOTAL KNEE ARTHROPLASTY Right 04/11/2015   Procedure: RIGHT TOTAL KNEE ARTHROPLASTY;  Surgeon: Gaynelle Arabian, MD;  Location: [facility];  Service: Orthopedics;  Laterality: Right;  . ulnar nerve surgery Right 1986    FAMILY HISTORY Family History  Problem Relation Age of Onset  . Hypertension Mother   . Stroke Mother   . Breast cancer Mother 64       died at 26  . Hypertension Father   . Heart attack Father        died at 23  . Hypertension Brother        MI  .  Prostate cancer Brother 66  . Hypertension Brother        MI  . Lung cancer Brother        possible lung cancer, spot found on lung being monitored  . Hypertension Brother        MI  . Non-Hodgkin's lymphoma Sister 68  . Breast cancer Other 60       is currently 90  . Breast cancer Other 55  . Cancer Cousin 62       type of cancer unk.   . Cancer Other        type unknown, eye/face   . Cancer Other 40       died from cancer in his 50's, type unknown   The patient's father died from a heart attack at age 47. The patient's mother was diagnosed with breast cancer at age 32 and died at age 45. The patient had 5 brothers, 3 sisters. One sister had breast cancer at age 58. One brother was diagnosed with prostate cancer at age 12. One Brother died from lung cancer at age 57. One  sister had non-Hodgkin's lymphoma diagnosed at age 96.   GYNECOLOGIC HISTORY:  No LMP recorded. Patient has had a hysterectomy.  menarche age 43, first live birth age 41. All 3 of her births were premature including 1 set of twins. One of the twins died a few hours after birth. She underwent hysterectomy in 1981 with unilateral salpingo-oophorectomy. 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  She has led groups as far away as [place] and [place] and has led many in the [place] cruises as well.  Her husband, Berneta Sages began a business installing fences which is now carried on by his son Dorothea Ogle. Berneta Sages is also the church organist. Son, Darnelle Maffucci is a truck Geophysicist/field seismologist in [employer]. The patient has one grandchild. She is a Tourist information centre manager.     ADVANCED DIRECTIVES: In place. The patient's husband is her healthcare power of attorney   HEALTH MAINTENANCE: Social History   Tobacco Use  . Smoking status: Never Smoker  . Smokeless tobacco: Never Used  Substance Use Topics  . Alcohol use: No  . Drug use: No      Colonoscopy:2017 / [place]   PAP:  Bone density: 2019/ [place] normal   Allergies  Allergen Reactions  . Duloxetine Other (See Comments)    UNSPECIFIED REACTION   . Nsaids Rash  . Oseltamivir Diarrhea and Other (See Comments)    Abdominal pain     Current Outpatient Medications  Medication Sig Dispense Refill  . acetaminophen (TYLENOL) 500 MG tablet Take 500 mg by mouth every 6 (six) hours as needed for moderate pain or headache.     . albuterol (PROVENTIL HFA;VENTOLIN HFA) 108 (90 Base) MCG/ACT inhaler Inhale 2 puffs into the lungs every 6 (six) hours as needed for wheezing or shortness of breath. 1 Inhaler 2  . anastrozole (ARIMIDEX) 1 MG tablet Take 1 tablet (1 mg total) by mouth daily. (Patient taking differently: Take 1 mg by mouth at bedtime. ) 90 tablet 4  . aspirin EC 81 MG tablet Take 81 mg by mouth daily.    Marland Kitchen atenolol (TENORMIN) 50 MG tablet Take 50 mg by mouth daily before breakfast.     . buPROPion (WELLBUTRIN XL) 150 MG 24 hr tablet Take 150 mg by mouth daily before breakfast.    . ferrous sulfate 325 (65 FE) MG tablet Take 1 tablet (325 mg total) by mouth 2 (two) times daily with a meal. 60 tablet 3  . folic acid (FOLVITE) 1 MG tablet Take 1 mg by mouth daily.    . furosemide (LASIX) 20 MG tablet Take 40 mg by mouth daily.    Marland Kitchen HYDROcodone-acetaminophen (NORCO) 5-325 MG tablet Take 1 tablet by mouth every 4 (four) hours as needed for moderate pain. (Patient not taking: Reported on 06/27/2018) 30 tablet 0  . isosorbide mononitrate (IMDUR) 30 MG 24 hr tablet Take 0.5 tablets (15 mg total) by mouth daily. 15 tablet 3  . lapatinib (TYKERB) 250 MG tablet Take 3 tablets (750 mg total) by mouth daily. Take on an empty stomach, 1 hour before or 2 hours after food. 90 tablet 6  . omeprazole (PRILOSEC) 40 MG capsule Take 40 mg by mouth every morning.    . ondansetron (ZOFRAN) 8 MG tablet Take 1 tablet (8 mg total) by mouth every 8 (eight) hours as needed for nausea or vomiting.  20 tablet 0  . Potassium  Chloride ER 20 MEQ TBCR Take 20 mg by mouth daily at 2 PM. 30 tablet 6  . rosuvastatin (CRESTOR) 40 MG tablet Take 1 tablet (40 mg total) by mouth daily. 30 tablet 3  . traMADol (ULTRAM) 50 MG tablet Take 50 mg by mouth every 6 (six) hours as needed.     Current Facility-Administered Medications  Medication Dose Route Frequency Provider Last Rate Last Dose  . 0.9 %  sodium chloride infusion  500 mL Intravenous Once Nandigam, Venia Minks, MD        OBJECTIVE: Morbidly obese white woman in no acute distress  Vitals:   08/26/18 1127  BP: (!) 147/64  Pulse: 65  Resp: 18  Temp: 98 F (36.7 C)  SpO2: 99%     Body mass index is 42.93 kg/m.  Domenick Gong Weights   08/26/18 1127  Weight: 250 lb 1.6 oz (113.4 kg)     ECOG FS:1 - Symptomatic but completely ambulatory  Sclerae unicteric, pupils round and equal No cervical or supraclavicular adenopathy Lungs no rales or rhonchi Heart regular rate and rhythm Abd soft, nontender, positive bowel sounds MSK no focal spinal tenderness, no upper extremity lymphedema Neuro: nonfocal, well oriented, appropriate affect Breasts: Status post right mastectomy with no evidence of chest wall recurrence.  Left breast is benign.  Both axillae are benign.    LAB RESULTS:  CMP     Component Value Date/Time   NA 136 08/26/2018 1116   NA 131 (L) 09/06/2017 1018   K 4.2 08/26/2018 1116   K 4.2 09/06/2017 1018   CL 102 08/26/2018 1116   CO2 28 08/26/2018 1116   CO2 25 09/06/2017 1018   GLUCOSE 233 (H) 08/26/2018 1116   GLUCOSE 187 (H) 09/06/2017 1018   BUN 14 08/26/2018 1116   BUN 11.2 09/06/2017 1018   CREATININE 1.06 (H) 08/26/2018 1116   CREATININE 1.04 02/21/2018 1139   CREATININE 1.2 (H) 09/06/2017 1018   CALCIUM 9.1 08/26/2018 1116   CALCIUM 9.3 09/06/2017 1018   PROT 7.0 08/26/2018 1116   PROT 6.9 09/06/2017 1018   ALBUMIN 3.2 (L) 08/26/2018 1116   ALBUMIN 3.4 (L) 09/06/2017 1018   AST 18 08/26/2018  1116   AST 20 09/06/2017 1018   ALT 16 08/26/2018 1116   ALT 14 09/06/2017 1018   ALKPHOS 126 08/26/2018 1116   ALKPHOS 103 09/06/2017 1018   BILITOT 0.3 08/26/2018 1116   BILITOT 0.57 09/06/2017 1018   GFRNONAA 49 (L) 08/26/2018 1116   GFRNONAA 51 (L) 02/21/2018 1139   GFRAA 57 (L) 08/26/2018 1116   GFRAA 59 (L) 02/21/2018 1139    No results found for: TOTALPROTELP, ALBUMINELP, A1GS, A2GS, BETS, BETA2SER, GAMS, MSPIKE, SPEI  No results found for: KPAFRELGTCHN, LAMBDASER, [facility]  Lab Results  Component Value Date   WBC 5.9 08/26/2018   NEUTROABS 3.4 08/26/2018   HGB 10.6 (L) 08/26/2018   HCT 32.7 (L) 08/26/2018   MCV 83.1 08/26/2018   PLT 284 08/26/2018      Chemistry      Component Value Date/Time   NA 136 08/26/2018 1116   NA 131 (L) 09/06/2017 1018   K 4.2 08/26/2018 1116   K 4.2 09/06/2017 1018   CL 102 08/26/2018 1116   CO2 28 08/26/2018 1116   CO2 25 09/06/2017 1018   BUN 14 08/26/2018 1116   BUN 11.2 09/06/2017 1018   CREATININE 1.06 (H) 08/26/2018 1116   CREATININE 1.04 02/21/2018 1139   CREATININE 1.2 (H) 09/06/2017  1018      Component Value Date/Time   CALCIUM 9.1 08/26/2018 1116   CALCIUM 9.3 09/06/2017 1018   ALKPHOS 126 08/26/2018 1116   ALKPHOS 103 09/06/2017 1018   AST 18 08/26/2018 1116   AST 20 09/06/2017 1018   ALT 16 08/26/2018 1116   ALT 14 09/06/2017 1018   BILITOT 0.3 08/26/2018 1116   BILITOT 0.57 09/06/2017 1018       No results found for: LABCA2  No components found for: [id number]  No results for input(s): INR in the last 168 hours.  Urinalysis    Component Value Date/Time   COLORURINE STRAW (A) 02/21/2018 1138   APPEARANCEUR CLEAR 02/21/2018 1138   LABSPEC 1.006 02/21/2018 1138   PHURINE 6.0 02/21/2018 1138   GLUCOSEU NEGATIVE 02/21/2018 1138   HGBUR SMALL (A) 02/21/2018 1138   BILIRUBINUR NEGATIVE 02/21/2018 1138   KETONESUR NEGATIVE 02/21/2018 1138   PROTEINUR NEGATIVE 02/21/2018 1138   UROBILINOGEN 0.2  04/06/2015 1332   NITRITE NEGATIVE 02/21/2018 1138   LEUKOCYTESUR NEGATIVE 02/21/2018 1138     STUDIES: Bone density 06/13/2018 reviewed  ELIGIBLE FOR AVAILABLE RESEARCH PROTOCOL: No  ASSESSMENT: 77 y.o. [place] woman status post right breast upper inner quadrant biopsy 2 on 05/27/2017 showing a clinical T1c N0, stage IA invasive ductal carcinoma, grade 3, estrogen and progesterone receptor positive, HER-2 amplified, with an MIB-1 of 60%  (1) genetics testing 07/14/2017 through the Multi-Cancer panel + Breast Cancer Panel found no deleterious mutations in  ALK, APC,AKT1, ATM, AXIN2,BAP1,  BARD1, BLM, BMPR1A, BRCA1, BRCA2, BRIP1, CASR, CDC73, CDH1, CDK4, CDKN1B, CDKN1C, CDKN2A (p14ARF), CDKN2A (p16INK4a), CEBPA, CHEK2, CTNNA1, DICER1, DIS3L2, EGFR (c.2369C>T, p.Thr790Met variant only), EPCAM (Deletion/duplication testing only), FH, FAM175A, FLCN, GATA2, GPC3, GREM1 (Promoter region deletion/duplication testing only), HOXB13 (c.251G>A, p.Gly84Glu), HRAS, KIT, MAX, MEN1, MET, MITF (c.952G>A, p.Glu318Lys variant only), MLH1, MSH2, MSH3, MSH6, MUTYH, NBN, NF1, NF2, NTHL1, PALB2, PDGFRA, PHOX2B, PMS2, POLD1, POLE, POT1, PRKAR1A, PTCH1, PTEN, RAD50, RAD51C, RAD51D, RB1, RECQL4, RET, RUNX1, SDHAF2, SDHA (sequence changes only), SDHB, SDHC, SDHD, SMAD4, SMARCA4, SMARCB1, SMARCE1, STK11, SUFU, TERC, TERT, TMEM127, TP53, TSC1, TSC2, VHL, WRN, WT1, FANCC,MRE11,PIK3CA,RINT1,XRCC2.  (a)  3 Variants of Uncertain significance (VUS) were identified.  BRCA1 c.2551G>A (p.Glu851Lys)----CHEK2 c.679G>A (p.Gly227Arg).  The CHECK 2 variant has been reported as possibly mosaic--- SDHA c.704T>C (p.Ile235Thr).  (2) anastrozole started 06/05/2017, to be continued and minimum of 5 years   (3) status post right mastectomy and sentinel lymph node sampling 07/02/2017 for an mpT2 pN0, stage IB invasive ductal carcinoma, grade 3, with negative margins.  (a) the patient opted against reconstruction  (4) trastuzumab for 6  months started 08/09/2017, discontinued after one dose with significant reaction  (a) baseline echocardiogram 06/19/2017 shows an excellent ejection fraction  (b) Echocardiogram 09/16/2017 showed an ejection fraction of 60-65%  (5) started lapatinib 12/02/2017-- to be continued 6 months  (a) echocardiogram 01/21/2018 shows an ejection fraction in the 60-65%.  (b) echocardiogram 06/02/2018 showed an ejection fraction in the 55-60% range PLAN: Kiara Brown is now a little over a year out from definitive surgery for her breast cancer with no evidence of disease recurrence.  This is very favorable.  She is tolerating anastrozole remarkably well and the plan will be to continue that a total of 5 years.  Her bone density July 2019 was normal.  We will repeat that July 2021  If she sees Dr. Excell Seltzer sometime in April, she can see me again October of next year  She knows to call for any  other issues that may develop before her next visit.   Cordae Mccarey, Virgie Dad, MD  08/26/18 12:10 PM Medical Oncology and Hematology [facility] [address] [place], [place] [address] Tel. [phone]    Fax. [phone]  Alice Rieger, am acting as scribe for Chauncey Cruel MD.  I, Lurline Del MD, have reviewed the above documentation for accuracy and completeness, and I agree with the above.

## 2018-08-26 ENCOUNTER — Telehealth: Payer: Self-pay | Admitting: Oncology

## 2018-08-26 ENCOUNTER — Inpatient Hospital Stay: Payer: Medicare Other | Attending: Oncology

## 2018-08-26 ENCOUNTER — Inpatient Hospital Stay (HOSPITAL_BASED_OUTPATIENT_CLINIC_OR_DEPARTMENT_OTHER): Payer: Medicare Other | Admitting: Oncology

## 2018-08-26 VITALS — BP 147/64 | HR 65 | Temp 98.0°F | Resp 18 | Ht 64.0 in | Wt 250.1 lb

## 2018-08-26 DIAGNOSIS — Z803 Family history of malignant neoplasm of breast: Secondary | ICD-10-CM | POA: Insufficient documentation

## 2018-08-26 DIAGNOSIS — Z79811 Long term (current) use of aromatase inhibitors: Secondary | ICD-10-CM

## 2018-08-26 DIAGNOSIS — C50211 Malignant neoplasm of upper-inner quadrant of right female breast: Secondary | ICD-10-CM | POA: Diagnosis not present

## 2018-08-26 DIAGNOSIS — Z8042 Family history of malignant neoplasm of prostate: Secondary | ICD-10-CM

## 2018-08-26 DIAGNOSIS — Z7982 Long term (current) use of aspirin: Secondary | ICD-10-CM | POA: Insufficient documentation

## 2018-08-26 DIAGNOSIS — Z807 Family history of other malignant neoplasms of lymphoid, hematopoietic and related tissues: Secondary | ICD-10-CM

## 2018-08-26 DIAGNOSIS — Z79899 Other long term (current) drug therapy: Secondary | ICD-10-CM | POA: Insufficient documentation

## 2018-08-26 DIAGNOSIS — Z801 Family history of malignant neoplasm of trachea, bronchus and lung: Secondary | ICD-10-CM

## 2018-08-26 DIAGNOSIS — Z9011 Acquired absence of right breast and nipple: Secondary | ICD-10-CM | POA: Insufficient documentation

## 2018-08-26 DIAGNOSIS — Z90710 Acquired absence of both cervix and uterus: Secondary | ICD-10-CM | POA: Insufficient documentation

## 2018-08-26 DIAGNOSIS — Z170 Estrogen receptor positive status [ER+]: Secondary | ICD-10-CM

## 2018-08-26 DIAGNOSIS — C50911 Malignant neoplasm of unspecified site of right female breast: Secondary | ICD-10-CM

## 2018-08-26 DIAGNOSIS — G4700 Insomnia, unspecified: Secondary | ICD-10-CM

## 2018-08-26 DIAGNOSIS — Z171 Estrogen receptor negative status [ER-]: Secondary | ICD-10-CM | POA: Insufficient documentation

## 2018-08-26 DIAGNOSIS — D509 Iron deficiency anemia, unspecified: Secondary | ICD-10-CM

## 2018-08-26 LAB — COMPREHENSIVE METABOLIC PANEL
ALK PHOS: 126 U/L (ref 38–126)
ALT: 16 U/L (ref 0–44)
ANION GAP: 6 (ref 5–15)
AST: 18 U/L (ref 15–41)
Albumin: 3.2 g/dL — ABNORMAL LOW (ref 3.5–5.0)
BUN: 14 mg/dL (ref 8–23)
CALCIUM: 9.1 mg/dL (ref 8.9–10.3)
CO2: 28 mmol/L (ref 22–32)
Chloride: 102 mmol/L (ref 98–111)
Creatinine, Ser: 1.06 mg/dL — ABNORMAL HIGH (ref 0.44–1.00)
GFR calc Af Amer: 57 mL/min — ABNORMAL LOW (ref >60)
GFR calc non Af Amer: 49 mL/min — ABNORMAL LOW (ref >60)
Glucose, Bld: 233 mg/dL — ABNORMAL HIGH (ref 70–99)
Potassium: 4.2 mmol/L (ref 3.5–5.1)
SODIUM: 136 mmol/L (ref 135–145)
Total Bilirubin: 0.3 mg/dL (ref 0.3–1.2)
Total Protein: 7 g/dL (ref 6.5–8.1)

## 2018-08-26 LAB — CBC WITH DIFFERENTIAL/PLATELET
BASOS PCT: 1 %
Basophils Absolute: 0 10*3/uL (ref 0.0–0.1)
Eosinophils Absolute: 0.1 10*3/uL (ref 0.0–0.5)
Eosinophils Relative: 2 %
HEMATOCRIT: 32.7 % — AB (ref 34.8–46.6)
Hemoglobin: 10.6 g/dL — ABNORMAL LOW (ref 11.6–15.9)
Lymphocytes Relative: 32 %
Lymphs Abs: 1.9 10*3/uL (ref 0.9–3.3)
MCH: 26.9 pg (ref 25.1–34.0)
MCHC: 32.4 g/dL (ref 31.5–36.0)
MCV: 83.1 fL (ref 79.5–101.0)
Monocytes Absolute: 0.5 10*3/uL (ref 0.1–0.9)
Monocytes Relative: 8 %
NEUTROS ABS: 3.4 10*3/uL (ref 1.5–6.5)
Neutrophils Relative %: 57 %
Platelets: 284 10*3/uL (ref 145–400)
RBC: 3.94 MIL/uL (ref 3.70–5.45)
RDW: 19.9 % — AB (ref 11.2–14.5)
WBC: 5.9 10*3/uL (ref 3.9–10.3)

## 2018-08-26 LAB — IRON AND TIBC
IRON: 41 ug/dL (ref 41–142)
Saturation Ratios: 12 % — ABNORMAL LOW (ref 21–57)
TIBC: 325 ug/dL (ref 236–444)
UIBC: 284 ug/dL

## 2018-08-26 LAB — FERRITIN: Ferritin: 22 ng/mL (ref 11–307)

## 2018-08-26 NOTE — Telephone Encounter (Signed)
Gave avs and calendar ° °

## 2018-09-03 ENCOUNTER — Other Ambulatory Visit (HOSPITAL_COMMUNITY): Payer: Self-pay

## 2018-09-03 MED ORDER — POTASSIUM CHLORIDE ER 20 MEQ PO TBCR: 20.0000 mg | EXTENDED_RELEASE_TABLET | Freq: Every day | ORAL | 6 refills | Status: DC

## 2018-09-04 ENCOUNTER — Other Ambulatory Visit: Payer: Self-pay | Admitting: Oncology

## 2018-09-12 ENCOUNTER — Telehealth: Payer: Self-pay | Admitting: *Deleted

## 2018-09-12 NOTE — Telephone Encounter (Signed)
This RN spoke with pt per her call- due to her concern of increasing " shooting pain in my left breast "  Per discussion she had this discomfort when she saw Dr Jana Hakim early October- " but now the pain has increased "  Note pt had right mastectomy. Left breast is unaffected side.  She had mammogram in July showing " benign calcifications" so she stopped drinking caffeine x 1 week with no improvement.  She says pain occurs through out the day, " starts from the bottom left and then shoots up the breast ".  She is not wearing new bras.  This note will be given to MD for review for further recommendations.

## 2018-09-12 NOTE — Telephone Encounter (Signed)
Per MD review recommended obtaining a diagnostic mammogram and ultrasound with bx if indicated of her left breast.  This RN attempted to contact pt and obtained identified VM - message left to return call to discuss recommendations.

## 2018-09-16 DIAGNOSIS — Z7984 Long term (current) use of oral hypoglycemic drugs: Secondary | ICD-10-CM | POA: Diagnosis not present

## 2018-09-16 DIAGNOSIS — E119 Type 2 diabetes mellitus without complications: Secondary | ICD-10-CM | POA: Diagnosis not present

## 2018-10-02 DIAGNOSIS — H2513 Age-related nuclear cataract, bilateral: Secondary | ICD-10-CM | POA: Diagnosis not present

## 2018-10-08 DIAGNOSIS — M0579 Rheumatoid arthritis with rheumatoid factor of multiple sites without organ or systems involvement: Secondary | ICD-10-CM | POA: Diagnosis not present

## 2018-10-08 DIAGNOSIS — Z23 Encounter for immunization: Secondary | ICD-10-CM | POA: Diagnosis not present

## 2018-10-08 DIAGNOSIS — I5032 Chronic diastolic (congestive) heart failure: Secondary | ICD-10-CM | POA: Diagnosis not present

## 2018-10-08 DIAGNOSIS — I2510 Atherosclerotic heart disease of native coronary artery without angina pectoris: Secondary | ICD-10-CM | POA: Diagnosis not present

## 2018-10-08 DIAGNOSIS — I10 Essential (primary) hypertension: Secondary | ICD-10-CM | POA: Diagnosis not present

## 2018-10-08 DIAGNOSIS — C50211 Malignant neoplasm of upper-inner quadrant of right female breast: Secondary | ICD-10-CM | POA: Diagnosis not present

## 2018-10-08 DIAGNOSIS — I110 Hypertensive heart disease with heart failure: Secondary | ICD-10-CM | POA: Diagnosis not present

## 2018-10-08 DIAGNOSIS — Z6841 Body Mass Index (BMI) 40.0 and over, adult: Secondary | ICD-10-CM | POA: Diagnosis not present

## 2018-10-08 DIAGNOSIS — Z171 Estrogen receptor negative status [ER-]: Secondary | ICD-10-CM | POA: Diagnosis not present

## 2018-10-08 DIAGNOSIS — E119 Type 2 diabetes mellitus without complications: Secondary | ICD-10-CM | POA: Diagnosis not present

## 2018-10-09 ENCOUNTER — Ambulatory Visit (HOSPITAL_COMMUNITY)
Admission: RE | Admit: 2018-10-09 | Discharge: 2018-10-09 | Disposition: A | Discharge status: Home / Self Care | Payer: Medicare Other | Source: Ambulatory Visit | Attending: Cardiology | Admitting: Cardiology

## 2018-10-09 DIAGNOSIS — I5032 Chronic diastolic (congestive) heart failure: Secondary | ICD-10-CM | POA: Diagnosis not present

## 2018-10-09 LAB — LIPID PANEL
CHOL/HDL RATIO: 4.3 ratio
CHOLESTEROL: 173 mg/dL (ref 0–200)
HDL: 40 mg/dL — AB (ref >40)
LDL Cholesterol: 110 mg/dL — ABNORMAL HIGH (ref 0–99)
Triglycerides: 116 mg/dL (ref <150)
VLDL: 23 mg/dL (ref 0–40)

## 2018-10-09 LAB — HEPATIC FUNCTION PANEL
ALBUMIN: 3.5 g/dL (ref 3.5–5.0)
ALT: 18 U/L (ref 0–44)
AST: 22 U/L (ref 15–41)
Alkaline Phosphatase: 108 U/L (ref 38–126)
BILIRUBIN INDIRECT: 0.6 mg/dL (ref 0.3–0.9)
Bilirubin, Direct: 0.1 mg/dL (ref 0.0–0.2)
TOTAL PROTEIN: 7 g/dL (ref 6.5–8.1)
Total Bilirubin: 0.7 mg/dL (ref 0.3–1.2)

## 2018-10-10 ENCOUNTER — Telehealth (HOSPITAL_COMMUNITY): Payer: Self-pay

## 2018-10-10 DIAGNOSIS — E785 Hyperlipidemia, unspecified: Secondary | ICD-10-CM

## 2018-10-10 MED ORDER — EZETIMIBE 10 MG PO TABS: 10.0000 mg | ORAL_TABLET | Freq: Every day | ORAL | 3 refills | Status: DC

## 2018-10-10 NOTE — Telephone Encounter (Signed)
Pt called given lab results medication sent to pharmacy 2 month labs scheduled

## 2018-10-15 DIAGNOSIS — E119 Type 2 diabetes mellitus without complications: Secondary | ICD-10-CM | POA: Diagnosis not present

## 2018-11-27 DIAGNOSIS — M0609 Rheumatoid arthritis without rheumatoid factor, multiple sites: Secondary | ICD-10-CM | POA: Diagnosis not present

## 2018-11-27 DIAGNOSIS — Z79899 Other long term (current) drug therapy: Secondary | ICD-10-CM | POA: Diagnosis not present

## 2018-12-07 ENCOUNTER — Other Ambulatory Visit: Payer: Self-pay | Admitting: Oncology

## 2018-12-10 ENCOUNTER — Other Ambulatory Visit (HOSPITAL_COMMUNITY): Payer: Medicare Other

## 2018-12-31 ENCOUNTER — Ambulatory Visit (HOSPITAL_COMMUNITY)
Admission: RE | Admit: 2018-12-31 | Discharge: 2018-12-31 | Disposition: A | Discharge status: Home / Self Care | Payer: Medicare Other | Source: Ambulatory Visit | Attending: Cardiology | Admitting: Cardiology

## 2018-12-31 DIAGNOSIS — E785 Hyperlipidemia, unspecified: Secondary | ICD-10-CM | POA: Diagnosis not present

## 2018-12-31 DIAGNOSIS — N182 Chronic kidney disease, stage 2 (mild): Secondary | ICD-10-CM

## 2018-12-31 LAB — LIPID PANEL
Cholesterol: 188 mg/dL (ref 0–200)
HDL: 41 mg/dL (ref >40)
LDL Cholesterol: 115 mg/dL — ABNORMAL HIGH (ref 0–99)
Total CHOL/HDL Ratio: 4.6 RATIO
Triglycerides: 162 mg/dL — ABNORMAL HIGH (ref <150)
VLDL: 32 mg/dL (ref 0–40)

## 2019-01-07 DIAGNOSIS — H2513 Age-related nuclear cataract, bilateral: Secondary | ICD-10-CM | POA: Diagnosis not present

## 2019-01-08 DIAGNOSIS — E119 Type 2 diabetes mellitus without complications: Secondary | ICD-10-CM | POA: Diagnosis not present

## 2019-01-08 DIAGNOSIS — Z171 Estrogen receptor negative status [ER-]: Secondary | ICD-10-CM | POA: Diagnosis not present

## 2019-01-08 DIAGNOSIS — G2581 Restless legs syndrome: Secondary | ICD-10-CM | POA: Diagnosis not present

## 2019-01-08 DIAGNOSIS — I5032 Chronic diastolic (congestive) heart failure: Secondary | ICD-10-CM | POA: Diagnosis not present

## 2019-01-08 DIAGNOSIS — I110 Hypertensive heart disease with heart failure: Secondary | ICD-10-CM | POA: Diagnosis not present

## 2019-01-08 DIAGNOSIS — I2510 Atherosclerotic heart disease of native coronary artery without angina pectoris: Secondary | ICD-10-CM | POA: Diagnosis not present

## 2019-01-08 DIAGNOSIS — F5101 Primary insomnia: Secondary | ICD-10-CM | POA: Diagnosis not present

## 2019-01-08 DIAGNOSIS — C50211 Malignant neoplasm of upper-inner quadrant of right female breast: Secondary | ICD-10-CM | POA: Diagnosis not present

## 2019-01-08 DIAGNOSIS — M0579 Rheumatoid arthritis with rheumatoid factor of multiple sites without organ or systems involvement: Secondary | ICD-10-CM | POA: Diagnosis not present

## 2019-01-22 DIAGNOSIS — M0609 Rheumatoid arthritis without rheumatoid factor, multiple sites: Secondary | ICD-10-CM | POA: Diagnosis not present

## 2019-01-29 DIAGNOSIS — C50911 Malignant neoplasm of unspecified site of right female breast: Secondary | ICD-10-CM | POA: Diagnosis not present

## 2019-01-29 DIAGNOSIS — Z170 Estrogen receptor positive status [ER+]: Secondary | ICD-10-CM | POA: Diagnosis not present

## 2019-02-02 ENCOUNTER — Telehealth (HOSPITAL_COMMUNITY): Payer: Self-pay | Admitting: *Deleted

## 2019-02-02 DIAGNOSIS — E785 Hyperlipidemia, unspecified: Secondary | ICD-10-CM

## 2019-02-02 NOTE — Telephone Encounter (Signed)
Notes recorded by Scarlette Calico, RN on 02/02/2019 at 3:20 PM EDT Pt aware and agreeable to referral, ref placed, copy mailed to pt per her request. ------  Notes recorded by Harvie Junior, CMA on 01/20/2019 at 4:14 PM EST Left VM for pt to call back. ------  Notes recorded by Harvie Junior, CMA on 01/05/2019 at 4:08 PM EST Left msg for pt to call back. ------  Notes recorded by Larey Dresser, MD on 01/05/2019 at 3:53 PM EST With CAD, she should have LDL < 70. Would refer her to lipid clinic to get started on Repatha (generally does not have side effects). ------  Notes recorded by Harvie Junior, CMA on 01/05/2019 at 10:09 AM EST Spoke with patient she is aware of results and requests copy of labs be mailed to her address. Pt said she does not take Crestor due to bad muscle and joint pain. Labs mailed. Message routed to [place] for advice. ------  Notes recorded by Larey Dresser, MD on 01/02/2019 at 4:14 PM EST LDL goal < 70 given CAD. If she is on both Crestor 40 daily and Zetia 10 daily, needs to go to lipid clinic for [place], please arrange

## 2019-03-01 ENCOUNTER — Other Ambulatory Visit: Payer: Self-pay | Admitting: Oncology

## 2019-03-30 ENCOUNTER — Telehealth: Payer: Self-pay | Admitting: Internal Medicine

## 2019-03-30 DIAGNOSIS — M5442 Lumbago with sciatica, left side: Secondary | ICD-10-CM | POA: Diagnosis not present

## 2019-03-30 DIAGNOSIS — M48062 Spinal stenosis, lumbar region with neurogenic claudication: Secondary | ICD-10-CM | POA: Diagnosis not present

## 2019-03-30 DIAGNOSIS — M5416 Radiculopathy, lumbar region: Secondary | ICD-10-CM | POA: Diagnosis not present

## 2019-03-30 DIAGNOSIS — M5136 Other intervertebral disc degeneration, lumbar region: Secondary | ICD-10-CM | POA: Diagnosis not present

## 2019-03-30 DIAGNOSIS — G8929 Other chronic pain: Secondary | ICD-10-CM | POA: Diagnosis not present

## 2019-03-30 DIAGNOSIS — M47816 Spondylosis without myelopathy or radiculopathy, lumbar region: Secondary | ICD-10-CM | POA: Diagnosis not present

## 2019-03-30 DIAGNOSIS — M5441 Lumbago with sciatica, right side: Secondary | ICD-10-CM | POA: Diagnosis not present

## 2019-03-30 NOTE — Telephone Encounter (Signed)
Patient called and appt type changed to virtual visit - video

## 2019-03-30 NOTE — Telephone Encounter (Signed)
Follow up  ° ° °Patient is returning call.  °

## 2019-03-30 NOTE — Telephone Encounter (Signed)
LMTCB to discuss 04/01/19 NEW LIPID CLINIC visit with Dr. Debara Pickett. Patient will need to change to virtual appointment visit OR r/s to in-office visit in August 2020.

## 2019-04-01 ENCOUNTER — Encounter: Payer: Self-pay | Admitting: Internal Medicine

## 2019-04-01 ENCOUNTER — Telehealth (INDEPENDENT_AMBULATORY_CARE_PROVIDER_SITE_OTHER): Payer: Medicare Other | Admitting: Internal Medicine

## 2019-04-01 VITALS — BP 130/60 | HR 65 | Ht 64.0 in | Wt 240.0 lb

## 2019-04-01 DIAGNOSIS — I10 Essential (primary) hypertension: Secondary | ICD-10-CM

## 2019-04-01 DIAGNOSIS — Z7189 Other specified counseling: Secondary | ICD-10-CM | POA: Diagnosis not present

## 2019-04-01 DIAGNOSIS — E785 Hyperlipidemia, unspecified: Secondary | ICD-10-CM | POA: Diagnosis not present

## 2019-04-01 DIAGNOSIS — I5032 Chronic diastolic (congestive) heart failure: Secondary | ICD-10-CM | POA: Diagnosis not present

## 2019-04-01 DIAGNOSIS — N182 Chronic kidney disease, stage 2 (mild): Secondary | ICD-10-CM

## 2019-04-01 MED ORDER — ATORVASTATIN CALCIUM 40 MG PO TABS: 40.0000 mg | ORAL_TABLET | Freq: Every day | ORAL | 3 refills | Status: DC

## 2019-04-01 NOTE — Progress Notes (Signed)
Virtual Visit via Video Note   This visit type was conducted due to national recommendations for restrictions regarding the COVID-19 Pandemic (e.g. social distancing) in an effort to limit this patient's exposure and mitigate transmission in our community.  Due to her co-morbid illnesses, this patient is at least at moderate risk for complications without adequate follow up.  This format is felt to be most appropriate for this patient at this time.  All issues noted in this document were discussed and addressed.  A limited physical exam was performed with this format.  Please refer to the patient's chart for her consent to telehealth for [facility].   Evaluation Performed:  Doximity video visit  Date:  04/01/2019   ID:  Kiara Brown, DOB 1941/10/01, MRN [id number]  Patient Location:  [address] [place] [address]  Provider location:   [address] [place], [place] [address]  PCP:  Burman Freestone, MD  Cardiologist:  No primary care provider on file. Electrophysiologist:  None   Chief Complaint:  Manage dyslipidemia  History of Present Illness:    Kiara Brown is a 78 y.o. female who presents via audio/video conferencing for a telehealth visit today.  Kiara Brown is a pleasant 78 year old female followed by Dr. Aundra Dubin in the cardio oncology clinic with a history of hyperlipidemia and breast cancer.  He had chest pain complaints in 2018 and underwent left heart cath which showed a 70% ostial D2 stenosis at a trifurcation.  There is also some luminal irregularities in the LAD.  She was placed on long-acting nitrate, aspirin beta-blocker and atorvastatin and has had some improvement in her symptoms.  She also has reported chronic diastolic congestive heart failure with LVEF 60 to 65%.  She was recently referred to the lipid clinic after lipid profile indicated her total cholesterol was 188, triglycerides 162, HDL 41 and LDL of 115.  The LDL is slowly risen  over the past 7 months, particularly related to diet, I suspect as she has been compliant with her statin medication.  Nonetheless, her goal LDL is less than 70.  The patient does not have symptoms concerning for COVID-19 infection (fever, chills, cough, or new SHORTNESS OF BREATH).    Prior CV studies:   The following studies were reviewed today:  Chart review Lab work  PMHx:  Past Medical History:  Diagnosis Date   Arthritis    osteo, rheumatoid   Breast cancer ([place])    Diverticulosis    Dysrhythmia    hx rapid heartbeat, no cardiologist   Family history of breast cancer    Family history of prostate cancer    Fibromyalgia    peripheral neuropathy   Fibromyalgia    GERD (gastroesophageal reflux disease)    H/O hiatal hernia    Hyperlipidemia    PONV (postoperative nausea and vomiting)    "THROAT WITH EXTREME ASTMHDQ"2229, ulnar nerve surgery   Sciatica    Shortness of breath dyspnea    with exertion   UTI (lower urinary tract infection)     Past Surgical History:  Procedure Laterality Date   ABDOMINAL HYSTERECTOMY     APPENDECTOMY     back injection      July 2018- x1   BREAST SURGERY Left 1986   biopsy, Recent 05/2017 at [facility]- had biopsy on R breast- malignant    CARDIAC CATHETERIZATION  2009   "no blockage per pt"   CHOLECYSTECTOMY     CYSTOSCOPY WITH BIOPSY  INCONTINENCE SURGERY     IRRIGATION AND DEBRIDEMENT ABSCESS Right 01/28/2018   Procedure: IRRIGATION AND DEBRIDEMENT AXILLARY ABSCESS;  Surgeon: Excell Seltzer, MD;  Location: [facility];  Service: General;  Laterality: Right;   JOINT REPLACEMENT     KNEE ARTHROSCOPY     bilateral   KNEE ARTHROSCOPY Left 03/17/2014   Procedure: ARTHROSCOPY LEFT KNEE WITH SYNOVECTOMY;  Surgeon: Gearlean Alf, MD;  Location: [facility];  Service: Orthopedics;  Laterality: Left;   PORTACATH PLACEMENT Left 07/02/2017   Procedure: INSERTION PORT-A-CATH;  Surgeon: Excell Seltzer, MD;  Location:  [facility];  Service: General;  Laterality: Left;   RIGHT/LEFT HEART CATH AND CORONARY ANGIOGRAPHY N/A 08/30/2017   Procedure: RIGHT/LEFT HEART CATH AND CORONARY ANGIOGRAPHY;  Surgeon: Larey Dresser, MD;  Location: [facility];  Service: Cardiovascular;  Laterality: N/A;   ROTATOR CUFF REPAIR     right   SIMPLE MASTECTOMY WITH AXILLARY SENTINEL NODE BIOPSY Right 07/02/2017   Procedure: RIGHT TOTAL  MASTECTOMY WITH AXILLARY SENTINEL LYMPH  NODE BIOPSY;  Surgeon: Excell Seltzer, MD;  Location: [facility];  Service: General;  Laterality: Right;   TOTAL KNEE ARTHROPLASTY  08/04/2012   Procedure: TOTAL KNEE ARTHROPLASTY;  Surgeon: Gearlean Alf, MD;  Location: [facility];  Service: Orthopedics;  Laterality: Left;   TOTAL KNEE ARTHROPLASTY Right 04/11/2015   Procedure: RIGHT TOTAL KNEE ARTHROPLASTY;  Surgeon: Gaynelle Arabian, MD;  Location: [facility];  Service: Orthopedics;  Laterality: Right;   ulnar nerve surgery Right 1986    FAMHx:  Family History  Problem Relation Age of Onset   Hypertension Mother    Stroke Mother    Breast cancer Mother 100       died at 61   Hypertension Father    Heart attack Father        died at 44   Hypertension Brother        MI   Prostate cancer Brother 35   Hypertension Brother        MI   Lung cancer Brother        possible lung cancer, spot found on lung being monitored   Hypertension Brother        MI   Non-Hodgkin's lymphoma Sister 51   Breast cancer Other 6       is currently 66   Breast cancer Other 37   Cancer Cousin 62       type of cancer unk.    Cancer Other        type unknown, eye/face    Cancer Other 33       died from cancer in his 25's, type unknown    SOCHx:   reports that she has never smoked. She has never used smokeless tobacco. She reports that she does not drink alcohol or use drugs.  ALLERGIES:  Allergies  Allergen Reactions   Duloxetine Other (See Comments)    UNSPECIFIED REACTION    Nsaids Rash    Oseltamivir Diarrhea and Other (See Comments)    Abdominal pain     MEDS:  Current Meds  Medication Sig   acetaminophen (TYLENOL) 500 MG tablet Take 500 mg by mouth every 6 (six) hours as needed for moderate pain or headache.    anastrozole (ARIMIDEX) 1 MG tablet TAKE 1 TABLET BY MOUTH EVERY DAY   aspirin EC 81 MG tablet Take 81 mg by mouth daily.   atenolol (TENORMIN) 50 MG tablet Take 50 mg by mouth daily before breakfast.  buPROPion (WELLBUTRIN XL) 150 MG 24 hr tablet Take 150 mg by mouth daily before breakfast.   ferrous sulfate 325 (65 FE) MG tablet Take 1 tablet (325 mg total) by mouth 2 (two) times daily with a meal.   folic acid (FOLVITE) 1 MG tablet Take 1 mg by mouth daily.   furosemide (LASIX) 20 MG tablet Take 40 mg by mouth daily.   omeprazole (PRILOSEC) 40 MG capsule Take 40 mg by mouth every morning.   ondansetron (ZOFRAN) 8 MG tablet Take 1 tablet (8 mg total) by mouth every 8 (eight) hours as needed for nausea or vomiting.   Potassium Chloride ER 20 MEQ TBCR Take 20 mg by mouth daily at 2 PM.   traMADol (ULTRAM) 50 MG tablet Take 50 mg by mouth every 6 (six) hours as needed.   Current Facility-Administered Medications for the 04/01/19 encounter (Telemedicine) with Pixie Casino, MD  Medication   0.9 %  sodium chloride infusion     ROS: Pertinent items noted in HPI and remainder of comprehensive ROS otherwise negative.  Labs/Other Tests and Data Reviewed:    Recent Labs: 08/26/2018: BUN 14; Creatinine, Ser 1.06; Hemoglobin 10.6; Platelets 284; Potassium 4.2; Sodium 136 10/09/2018: ALT 18   Recent Lipid Panel Lab Results  Component Value Date/Time   CHOL 188 12/31/2018 08:42 AM   TRIG 162 (H) 12/31/2018 08:42 AM   HDL 41 12/31/2018 08:42 AM   CHOLHDL 4.6 12/31/2018 08:42 AM   LDLCALC 115 (H) 12/31/2018 08:42 AM    Wt Readings from Last 3 Encounters:  04/01/19 240 lb (108.9 kg)  08/26/18 250 lb 1.6 oz (113.4 kg)  06/27/18 244 lb (110.7 kg)      Exam:    Vital Signs:  BP 130/60    Pulse 65    Ht 5\' 4"  (1.626 m)    Wt 240 lb (108.9 kg)    BMI 41.20 kg/m    General appearance: alert and no distress Lungs: No audible wheezing or visual respiratory difficulty Extremities: extremities normal, atraumatic, no cyanosis or edema Skin: Skin color, texture, turgor normal. No rashes or lesions Neurologic: Mental status: Alert, oriented, thought content appropriate Psych: Pleasant  ASSESSMENT & PLAN:    1. Mixed dyslipidemia 2. CAD without prior intervention 3. Chronic diastolic congestive heart failure-LVEF 60 to 65% 4. History of breast cancer 5.  Kiara Brown has a mixed dyslipidemia with rising LDL likely dietary related.  We talked about options to make some dietary changes and as to whether or not she would be a candidate for PCSK9 inhibitor.  Unfortunately, she has been intolerant to her statin (rosuvastatin 40 mg) .  She was also ordered to be on atorvastatin in 2018, however she does not recall taking it.  We also discussed PCSK9 inhibitor.  She was reluctant to do injections at this time.  She wished to try atorvastatin for short period of time and if it is not well tolerated, then would consider starting the PCSK9 inhibitor.  Plan to start atorvastatin 40 mg nightly and check a fasting lipid profile in 3 months.  Thanks again for the kind referral.  COVID-19 Education: The signs and symptoms of COVID-19 were discussed with the patient and how to seek care for testing (follow up with PCP or arrange E-visit).  The importance of social distancing was discussed today.  Patient Risk:   After full review of this patients clinical status, I feel that they are at least moderate risk at this time.  Time:   Today, I have spent 25 minutes with the patient with telehealth technology discussing this lipidemia, PCSK9 inhibitors, cardiovascular risk and recommendations.     Medication Adjustments/Labs and Tests Ordered: Current  medicines are reviewed at length with the patient today.  Concerns regarding medicines are outlined above.   Tests Ordered: Orders Placed This Encounter  Procedures   Lipid panel    Medication Changes: Meds ordered this encounter  Medications   atorvastatin (LIPITOR) 40 MG tablet    Sig: Take 1 tablet (40 mg total) by mouth daily.    Dispense:  90 tablet    Refill:  3    Disposition:  in 4 month(s)  Pixie Casino, MD, [facility], [place] Director of the Advanced Lipid Disorders &  Cardiovascular Risk Reduction Clinic Diplomate of the American Board of Clinical Lipidology Attending Cardiologist  Direct Dial: [phone]   Fax: [phone]  Website:  [email or web address]  Pixie Casino, MD  04/01/2019 9:27 PM

## 2019-04-01 NOTE — Patient Instructions (Signed)
Medication Instructions:  START atorvastatin 40mg  daily If you need a refill on your cardiac medications before your next appointment, please call your pharmacy.   Lab work: FASTING lab work in 3 months to check cholesterol  If you have labs (blood work) drawn today and your tests are completely normal, you will receive your results only by: Marland Kitchen MyChart Message (if you have MyChart) OR . A paper copy in the mail If you have any lab test that is abnormal or we need to change your treatment, we will call you to review the results.  Testing/Procedures: NONE  Follow-Up: Dr. Debara Pickett recommends that you schedule a follow up visit with him the in the [facility] in 3 months.  Please have fasting blood work about 1 week prior to this visit and he will review the blood work results with you at your appointment.

## 2019-04-02 ENCOUNTER — Other Ambulatory Visit: Payer: Self-pay | Admitting: Oncology

## 2019-04-09 DIAGNOSIS — I119 Hypertensive heart disease without heart failure: Secondary | ICD-10-CM | POA: Diagnosis not present

## 2019-04-09 DIAGNOSIS — M0579 Rheumatoid arthritis with rheumatoid factor of multiple sites without organ or systems involvement: Secondary | ICD-10-CM | POA: Diagnosis not present

## 2019-04-09 DIAGNOSIS — G2581 Restless legs syndrome: Secondary | ICD-10-CM | POA: Diagnosis not present

## 2019-04-09 DIAGNOSIS — I5032 Chronic diastolic (congestive) heart failure: Secondary | ICD-10-CM | POA: Diagnosis not present

## 2019-04-09 DIAGNOSIS — F5101 Primary insomnia: Secondary | ICD-10-CM | POA: Diagnosis not present

## 2019-04-09 DIAGNOSIS — E119 Type 2 diabetes mellitus without complications: Secondary | ICD-10-CM | POA: Diagnosis not present

## 2019-04-09 DIAGNOSIS — E611 Iron deficiency: Secondary | ICD-10-CM | POA: Diagnosis not present

## 2019-04-09 DIAGNOSIS — I2510 Atherosclerotic heart disease of native coronary artery without angina pectoris: Secondary | ICD-10-CM | POA: Diagnosis not present

## 2019-04-21 DIAGNOSIS — H25013 Cortical age-related cataract, bilateral: Secondary | ICD-10-CM | POA: Diagnosis not present

## 2019-04-21 DIAGNOSIS — H43822 Vitreomacular adhesion, left eye: Secondary | ICD-10-CM | POA: Diagnosis not present

## 2019-04-21 DIAGNOSIS — E119 Type 2 diabetes mellitus without complications: Secondary | ICD-10-CM | POA: Diagnosis not present

## 2019-04-21 DIAGNOSIS — H25043 Posterior subcapsular polar age-related cataract, bilateral: Secondary | ICD-10-CM | POA: Diagnosis not present

## 2019-04-21 DIAGNOSIS — H2513 Age-related nuclear cataract, bilateral: Secondary | ICD-10-CM | POA: Diagnosis not present

## 2019-04-21 DIAGNOSIS — H2511 Age-related nuclear cataract, right eye: Secondary | ICD-10-CM | POA: Diagnosis not present

## 2019-04-21 DIAGNOSIS — H18413 Arcus senilis, bilateral: Secondary | ICD-10-CM | POA: Diagnosis not present

## 2019-05-07 DIAGNOSIS — M0609 Rheumatoid arthritis without rheumatoid factor, multiple sites: Secondary | ICD-10-CM | POA: Diagnosis not present

## 2019-05-15 DIAGNOSIS — H2511 Age-related nuclear cataract, right eye: Secondary | ICD-10-CM | POA: Diagnosis not present

## 2019-05-15 DIAGNOSIS — H2512 Age-related nuclear cataract, left eye: Secondary | ICD-10-CM | POA: Diagnosis not present

## 2019-05-20 DIAGNOSIS — M5416 Radiculopathy, lumbar region: Secondary | ICD-10-CM | POA: Diagnosis not present

## 2019-05-20 DIAGNOSIS — M5136 Other intervertebral disc degeneration, lumbar region: Secondary | ICD-10-CM | POA: Diagnosis not present

## 2019-05-20 DIAGNOSIS — M48062 Spinal stenosis, lumbar region with neurogenic claudication: Secondary | ICD-10-CM | POA: Diagnosis not present

## 2019-05-27 IMAGING — CR DG CHEST 2V
2 series · 2 of 2 positions shown · non-contrast
Comparison: July 02, 2017

CLINICAL DATA: Breast carcinoma.  Shortness of breath.  Chills.

EXAM:
CHEST  2 VIEW

[w chest pa]
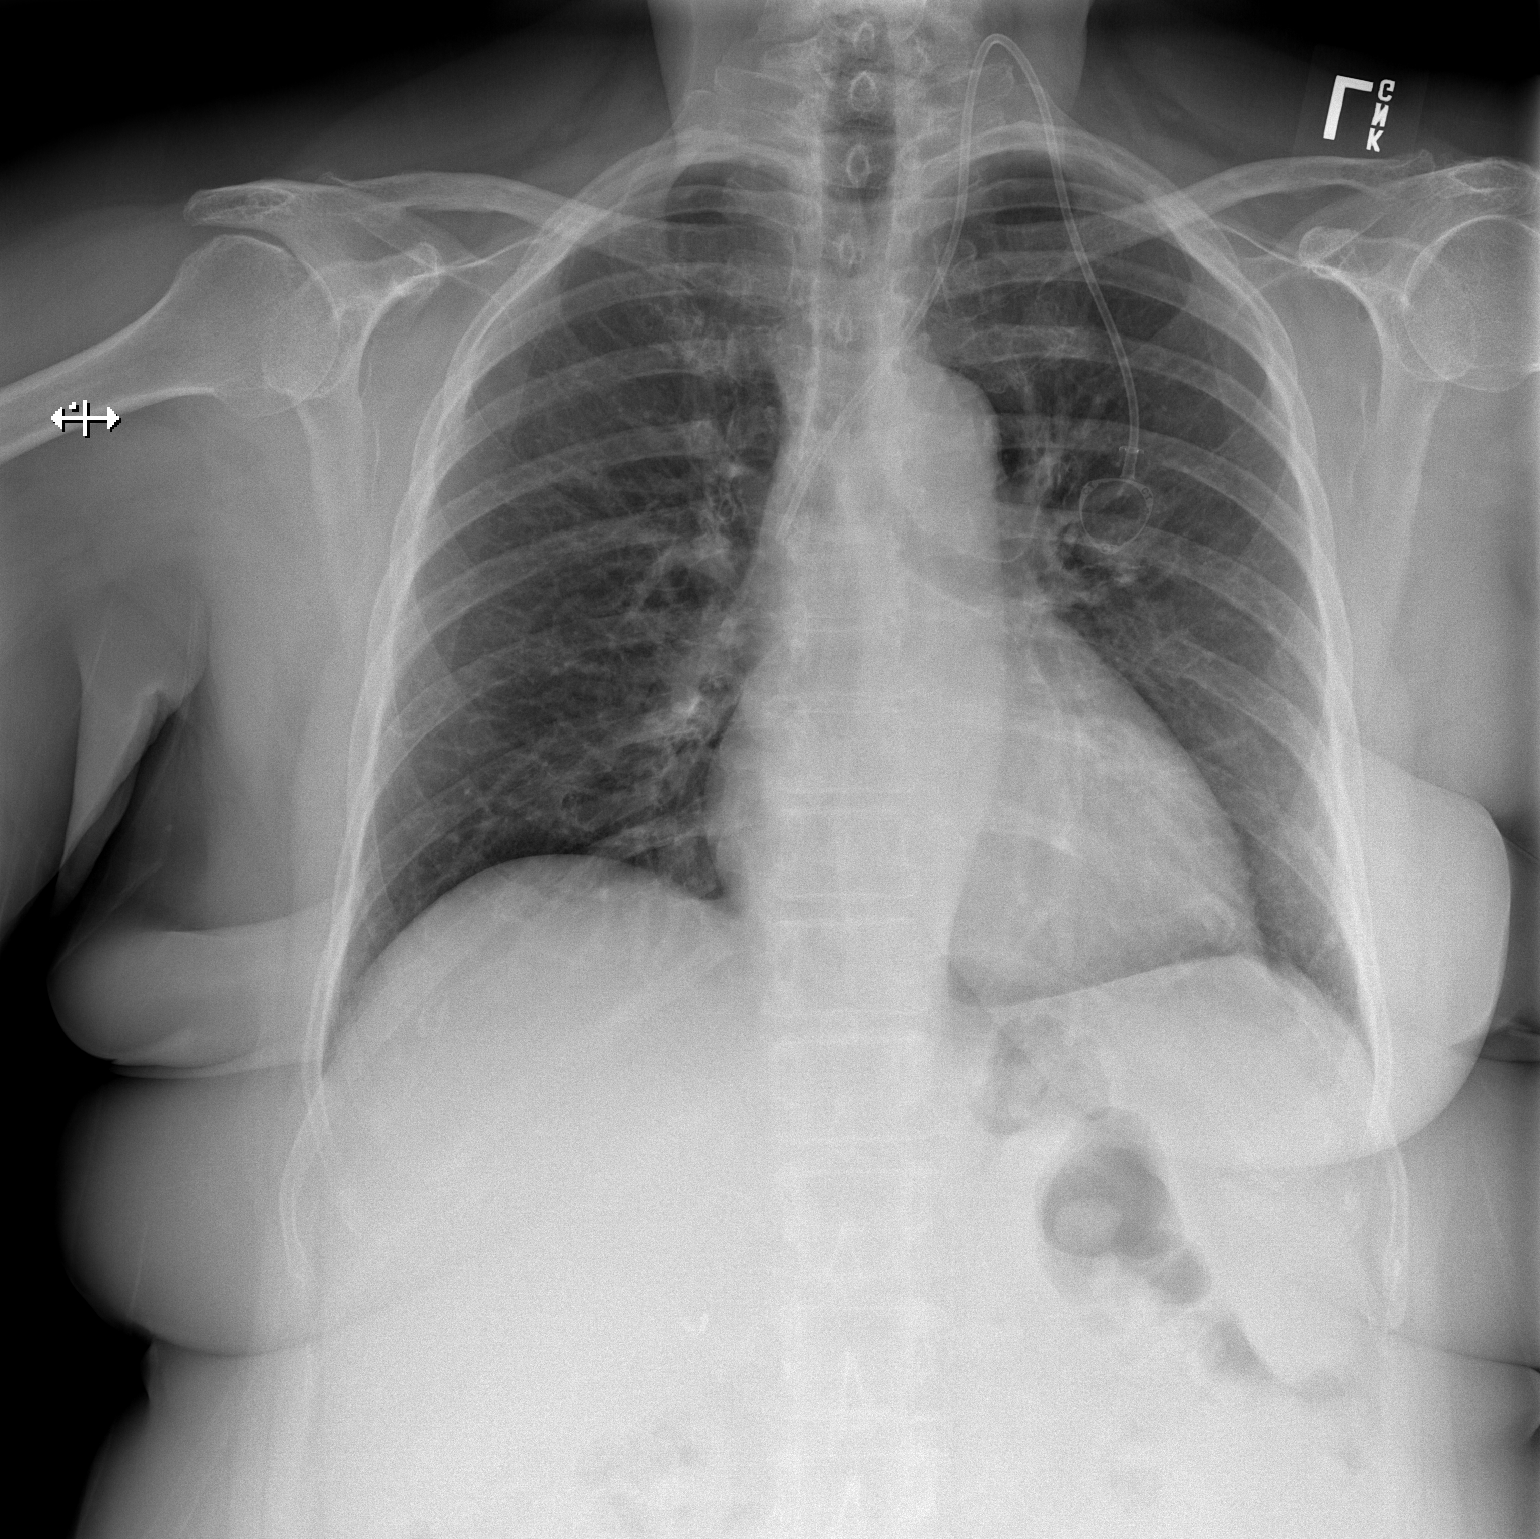

[w chest lat]
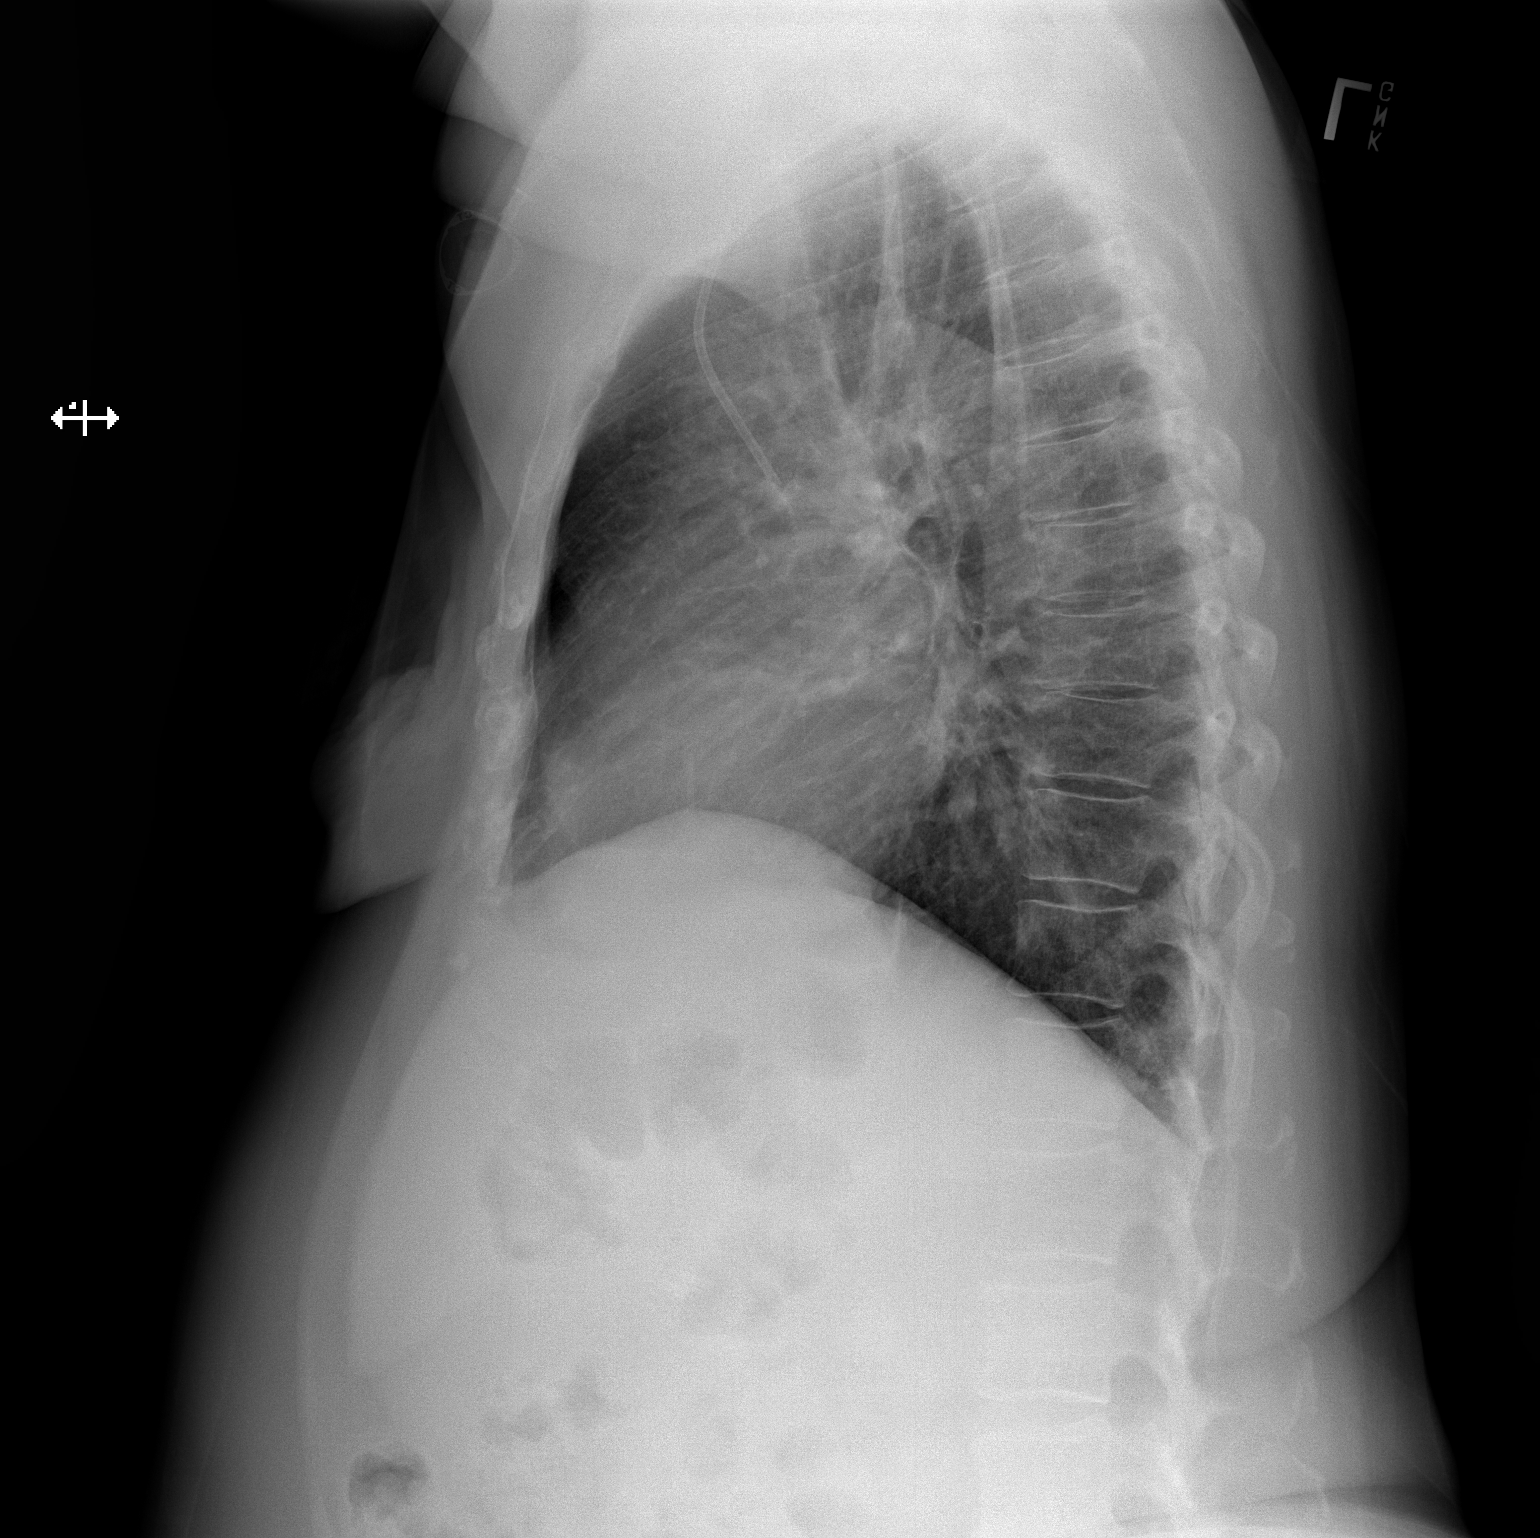

[2 of 2 positions shown; findings below may reference images not displayed]

FINDINGS: There is no edema or consolidation. Heart size and pulmonary
vascularity are normal. No adenopathy. Port-A-Cath tip is in the
superior vena cava. No pneumothorax. No evident bone lesions.
Patient is status post right mastectomy.
IMPRESSION: No edema or consolidation.

## 2019-06-01 DIAGNOSIS — H2512 Age-related nuclear cataract, left eye: Secondary | ICD-10-CM | POA: Diagnosis not present

## 2019-06-12 ENCOUNTER — Other Ambulatory Visit: Payer: Self-pay | Admitting: Gastroenterology

## 2019-06-12 NOTE — Telephone Encounter (Signed)
Dr Silverio Decamp can we refill this?

## 2019-06-15 ENCOUNTER — Other Ambulatory Visit: Payer: Self-pay

## 2019-06-15 MED ORDER — FERROUS SULFATE 325 (65 FE) MG PO TABS: 325.0000 mg | ORAL_TABLET | Freq: Two times a day (BID) | ORAL | 0 refills | Status: DC

## 2019-06-17 DIAGNOSIS — Z6841 Body Mass Index (BMI) 40.0 and over, adult: Secondary | ICD-10-CM | POA: Diagnosis not present

## 2019-06-17 DIAGNOSIS — Z90710 Acquired absence of both cervix and uterus: Secondary | ICD-10-CM | POA: Diagnosis not present

## 2019-06-17 DIAGNOSIS — N760 Acute vaginitis: Secondary | ICD-10-CM | POA: Diagnosis not present

## 2019-06-17 DIAGNOSIS — Z1272 Encounter for screening for malignant neoplasm of vagina: Secondary | ICD-10-CM | POA: Diagnosis not present

## 2019-06-17 DIAGNOSIS — Z124 Encounter for screening for malignant neoplasm of cervix: Secondary | ICD-10-CM | POA: Diagnosis not present

## 2019-06-19 DIAGNOSIS — R300 Dysuria: Secondary | ICD-10-CM | POA: Diagnosis not present

## 2019-06-19 DIAGNOSIS — R8290 Unspecified abnormal findings in urine: Secondary | ICD-10-CM | POA: Diagnosis not present

## 2019-06-23 DIAGNOSIS — L720 Epidermal cyst: Secondary | ICD-10-CM | POA: Diagnosis not present

## 2019-06-23 DIAGNOSIS — L814 Other melanin hyperpigmentation: Secondary | ICD-10-CM | POA: Diagnosis not present

## 2019-06-23 DIAGNOSIS — L821 Other seborrheic keratosis: Secondary | ICD-10-CM | POA: Diagnosis not present

## 2019-07-08 DIAGNOSIS — Z1231 Encounter for screening mammogram for malignant neoplasm of breast: Secondary | ICD-10-CM | POA: Diagnosis not present

## 2019-07-08 DIAGNOSIS — Z853 Personal history of malignant neoplasm of breast: Secondary | ICD-10-CM | POA: Diagnosis not present

## 2019-07-09 NOTE — Telephone Encounter (Signed)
LM for patient to call back.   She is scheduled for a LIPID CLINIC follow up on a virtual day but her appointment type is listed as "office visit". Need to confirm with patient she is OK with virtual visit - phone or video.   Patient also needs fasting lab work prior to her appointment.

## 2019-07-15 DIAGNOSIS — F4321 Adjustment disorder with depressed mood: Secondary | ICD-10-CM | POA: Diagnosis not present

## 2019-07-15 DIAGNOSIS — F5101 Primary insomnia: Secondary | ICD-10-CM | POA: Diagnosis not present

## 2019-07-15 DIAGNOSIS — E119 Type 2 diabetes mellitus without complications: Secondary | ICD-10-CM | POA: Diagnosis not present

## 2019-07-15 DIAGNOSIS — I5032 Chronic diastolic (congestive) heart failure: Secondary | ICD-10-CM | POA: Diagnosis not present

## 2019-07-15 DIAGNOSIS — G2581 Restless legs syndrome: Secondary | ICD-10-CM | POA: Diagnosis not present

## 2019-07-15 DIAGNOSIS — M62838 Other muscle spasm: Secondary | ICD-10-CM | POA: Diagnosis not present

## 2019-07-15 DIAGNOSIS — I110 Hypertensive heart disease with heart failure: Secondary | ICD-10-CM | POA: Diagnosis not present

## 2019-07-15 DIAGNOSIS — E611 Iron deficiency: Secondary | ICD-10-CM | POA: Diagnosis not present

## 2019-07-17 DIAGNOSIS — E785 Hyperlipidemia, unspecified: Secondary | ICD-10-CM | POA: Diagnosis not present

## 2019-07-17 LAB — LIPID PANEL
Chol/HDL Ratio: 3.5 ratio (ref 0.0–4.4)
Cholesterol, Total: 148 mg/dL (ref 100–199)
HDL: 42 mg/dL (ref >39)
LDL Calculated: 87 mg/dL (ref 0–99)
Triglycerides: 97 mg/dL (ref 0–149)
VLDL Cholesterol Cal: 19 mg/dL (ref 5–40)

## 2019-07-20 ENCOUNTER — Telehealth (INDEPENDENT_AMBULATORY_CARE_PROVIDER_SITE_OTHER): Payer: Medicare Other | Admitting: Internal Medicine

## 2019-07-20 ENCOUNTER — Other Ambulatory Visit: Payer: Self-pay

## 2019-07-20 ENCOUNTER — Encounter: Payer: Self-pay | Admitting: Internal Medicine

## 2019-07-20 ENCOUNTER — Telehealth: Payer: Self-pay | Admitting: Internal Medicine

## 2019-07-20 DIAGNOSIS — I2510 Atherosclerotic heart disease of native coronary artery without angina pectoris: Secondary | ICD-10-CM

## 2019-07-20 DIAGNOSIS — E785 Hyperlipidemia, unspecified: Secondary | ICD-10-CM | POA: Diagnosis not present

## 2019-07-20 DIAGNOSIS — I10 Essential (primary) hypertension: Secondary | ICD-10-CM | POA: Diagnosis not present

## 2019-07-20 MED ORDER — ROSUVASTATIN CALCIUM 5 MG PO TABS: 5.0000 mg | ORAL_TABLET | ORAL | 3 refills | Status: DC

## 2019-07-20 NOTE — Telephone Encounter (Signed)
LVM for patient to call back and schedule 3 month lipid clinic followup.

## 2019-07-20 NOTE — Patient Instructions (Signed)
Medication Instructions:  START crestor 5mg  every other day If you need a refill on your cardiac medications before your next appointment, please call your pharmacy.   Lab work: FASTING lab work in 3 months to check cholesterol If you have labs (blood work) drawn today and your tests are completely normal, you will receive your results only by: Marland Kitchen MyChart Message (if you have MyChart) OR . A paper copy in the mail If you have any lab test that is abnormal or we need to change your treatment, we will call you to review the results.  Follow-Up: Dr. Debara Pickett recommends that you schedule a follow up visit with him the in the [facility] in 3 months. Please have fasting blood work about 1 week prior to this visit and he will review the blood work results with you at your appointment.

## 2019-07-20 NOTE — Progress Notes (Signed)
Virtual Visit via Video Note   This visit type was conducted due to national recommendations for restrictions regarding the COVID-19 Pandemic (e.g. social distancing) in an effort to limit this patient's exposure and mitigate transmission in our community.  Due to her co-morbid illnesses, this patient is at least at moderate risk for complications without adequate follow up.  This format is felt to be most appropriate for this patient at this time.  All issues noted in this document were discussed and addressed.  A limited physical exam was performed with this format.  Please refer to the patient's chart for her consent to telehealth for [facility].   Evaluation Performed:  Telephone visit  Date:  07/20/2019   ID:  Kiara Brown, DOB 04-24-1941, MRN [id number]  Patient Location:  [address] [place] [address]  Provider location:   [address], [address], [place] [address]  PCP:  Burman Freestone, MD  Cardiologist:  No primary care provider on file. Electrophysiologist:  None   Chief Complaint:  Follow-up dyslipidemia  History of Present Illness:    Kiara Brown is a 78 y.o. female who presents via audio/video conferencing for a telehealth visit today.  Kiara Brown is a pleasant 78 year old female followed by Dr. Aundra Dubin in the cardio oncology clinic with a history of hyperlipidemia and breast cancer.  He had chest pain complaints in 2018 and underwent left heart cath which showed a 70% ostial D2 stenosis at a trifurcation.  There is also some luminal irregularities in the LAD.  She was placed on long-acting nitrate, aspirin beta-blocker and atorvastatin and has had some improvement in her symptoms.  She also has reported chronic diastolic congestive heart failure with LVEF 60 to 65%.  She was recently referred to the lipid clinic after lipid profile indicated her total cholesterol was 188, triglycerides 162, HDL 41 and LDL of 115.  The LDL is slowly risen  over the past 7 months, particularly related to diet, I suspect as she has been compliant with her statin medication.  Nonetheless, her goal LDL is less than 70.  07/20/2019  Ms. [name] is seen today in follow-up of dyslipidemia.  When I saw her I had recommended starting atorvastatin 40 mg daily as an alternative to high-dose rosuvastatin to see if it was better tolerated.  She took it for less than 2 weeks and started to have significant side effects and then discontinue the medication.  Since then, however she has made significant dietary changes, and her lipid profile has improved.  Her recent lipid profile showed a total cholesterol 148, triglycerides 97, HDL 42 and LDL of 87.  This is down from 115 - 6 months ago.  She has been off of her statin for 2 months, so this is likely an effect from diet changes.  We discussed that her target LDL is less than 70 and I still think she benefit from the pleiotropic effects of statin therapy.  Alternatives also include PCSK9 inhibitors which she has been resistant to as well as perhaps bempedoic acid although outcomes data is lacking.  She is concerned that she was thought to have such minimal coronary disease and wonder why she was labeled with having heart disease even though she had a small blocked vessel.  Nonetheless, I would agree aggressive therapy is recommended, especially given her age.  The patient does not have symptoms concerning for COVID-19 infection (fever, chills, cough, or new SHORTNESS OF BREATH).    Prior  CV studies:   The following studies were reviewed today:  Chart review Lab work  PMHx:  Past Medical History:  Diagnosis Date   Arthritis    osteo, rheumatoid   Breast cancer ([place])    Diverticulosis    Dysrhythmia    hx rapid heartbeat, no cardiologist   Family history of breast cancer    Family history of prostate cancer    Fibromyalgia    peripheral neuropathy   Fibromyalgia    GERD (gastroesophageal reflux  disease)    H/O hiatal hernia    Hyperlipidemia    PONV (postoperative nausea and vomiting)    "THROAT WITH EXTREME IF:[id number], ulnar nerve surgery   Sciatica    Shortness of breath dyspnea    with exertion   UTI (lower urinary tract infection)     Past Surgical History:  Procedure Laterality Date   ABDOMINAL HYSTERECTOMY     APPENDECTOMY     back injection      July 2018- x1   BREAST SURGERY Left 1986   biopsy, Recent 05/2017 at [facility]- had biopsy on R breast- malignant    CARDIAC CATHETERIZATION  2009   "no blockage per pt"   CHOLECYSTECTOMY     CYSTOSCOPY WITH BIOPSY     INCONTINENCE SURGERY     IRRIGATION AND DEBRIDEMENT ABSCESS Right 01/28/2018   Procedure: IRRIGATION AND DEBRIDEMENT AXILLARY ABSCESS;  Surgeon: Excell Seltzer, MD;  Location: [facility];  Service: General;  Laterality: Right;   JOINT REPLACEMENT     KNEE ARTHROSCOPY     bilateral   KNEE ARTHROSCOPY Left 03/17/2014   Procedure: ARTHROSCOPY LEFT KNEE WITH SYNOVECTOMY;  Surgeon: Gearlean Alf, MD;  Location: [facility];  Service: Orthopedics;  Laterality: Left;   PORTACATH PLACEMENT Left 07/02/2017   Procedure: INSERTION PORT-A-CATH;  Surgeon: Excell Seltzer, MD;  Location: [place];  Service: General;  Laterality: Left;   RIGHT/LEFT HEART CATH AND CORONARY ANGIOGRAPHY N/A 08/30/2017   Procedure: RIGHT/LEFT HEART CATH AND CORONARY ANGIOGRAPHY;  Surgeon: Larey Dresser, MD;  Location: [place] [facility];  Service: Cardiovascular;  Laterality: N/A;   ROTATOR CUFF REPAIR     right   SIMPLE MASTECTOMY WITH AXILLARY SENTINEL NODE BIOPSY Right 07/02/2017   Procedure: RIGHT TOTAL  MASTECTOMY WITH AXILLARY SENTINEL LYMPH  NODE BIOPSY;  Surgeon: Excell Seltzer, MD;  Location: [place];  Service: General;  Laterality: Right;   TOTAL KNEE ARTHROPLASTY  08/04/2012   Procedure: TOTAL KNEE ARTHROPLASTY;  Surgeon: Gearlean Alf, MD;  Location: [facility];  Service: Orthopedics;  Laterality: Left;   TOTAL KNEE  ARTHROPLASTY Right 04/11/2015   Procedure: RIGHT TOTAL KNEE ARTHROPLASTY;  Surgeon: Gaynelle Arabian, MD;  Location: [facility];  Service: Orthopedics;  Laterality: Right;   ulnar nerve surgery Right 1986    FAMHx:  Family History  Problem Relation Age of Onset   Hypertension Mother    Stroke Mother    Breast cancer Mother 20       died at 31   Hypertension Father    Heart attack Father        died at 40   Hypertension Brother        MI   Prostate cancer Brother 7   Hypertension Brother        MI   Lung cancer Brother        possible lung cancer, spot found on lung being monitored   Hypertension Brother        MI  Non-Hodgkin's lymphoma Sister 24   Breast cancer Other 36       is currently 59   Breast cancer Other 50   Cancer Cousin 62       type of cancer unk.    Cancer Other        type unknown, eye/face    Cancer Other 53       died from cancer in his 72's, type unknown    SOCHx:   reports that she has never smoked. She has never used smokeless tobacco. She reports that she does not drink alcohol or use drugs.  ALLERGIES:  Allergies  Allergen Reactions   Atorvastatin Other (See Comments)    myalgias    Duloxetine Other (See Comments)    UNSPECIFIED REACTION    Nsaids Rash   Oseltamivir Diarrhea and Other (See Comments)    Abdominal pain     MEDS:  Current Meds  Medication Sig   acetaminophen (TYLENOL) 500 MG tablet Take 500 mg by mouth every 6 (six) hours as needed for moderate pain or headache.    anastrozole (ARIMIDEX) 1 MG tablet TAKE 1 TABLET BY MOUTH EVERY DAY   aspirin EC 81 MG tablet Take 81 mg by mouth daily.   atenolol (TENORMIN) 50 MG tablet Take 50 mg by mouth daily before breakfast.    buPROPion (WELLBUTRIN XL) 150 MG 24 hr tablet Take 150 mg by mouth daily before breakfast.   ferrous sulfate 325 (65 FE) MG tablet Take 1 tablet (325 mg total) by mouth 2 (two) times daily with a meal.   folic acid (FOLVITE) 1 MG tablet  Take 1 mg by mouth daily.   furosemide (LASIX) 20 MG tablet Take 20 mg by mouth daily.    glipiZIDE (GLUCOTROL XL) 5 MG 24 hr tablet Take 1 tablet by mouth daily.   omeprazole (PRILOSEC) 40 MG capsule Take 40 mg by mouth every morning.   Potassium Chloride ER 20 MEQ TBCR Take 20 mg by mouth daily at 2 PM.   traMADol (ULTRAM) 50 MG tablet Take 50 mg by mouth every 6 (six) hours as needed.   traZODone (DESYREL) 50 MG tablet Take 1 tablet by mouth at bedtime.   [DISCONTINUED] ondansetron (ZOFRAN) 8 MG tablet Take 1 tablet (8 mg total) by mouth every 8 (eight) hours as needed for nausea or vomiting.   Current Facility-Administered Medications for the 07/20/19 encounter (Telemedicine) with Pixie Casino, MD  Medication   0.9 %  sodium chloride infusion     ROS: Pertinent items noted in HPI and remainder of comprehensive ROS otherwise negative.  Labs/Other Tests and Data Reviewed:    Recent Labs: 08/26/2018: BUN 14; Creatinine, Ser 1.06; Hemoglobin 10.6; Platelets 284; Potassium 4.2; Sodium 136 10/09/2018: ALT 18   Recent Lipid Panel Lab Results  Component Value Date/Time   CHOL 148 07/17/2019 09:27 AM   TRIG 97 07/17/2019 09:27 AM   HDL 42 07/17/2019 09:27 AM   CHOLHDL 3.5 07/17/2019 09:27 AM   CHOLHDL 4.6 12/31/2018 08:42 AM   LDLCALC 87 07/17/2019 09:27 AM    Wt Readings from Last 3 Encounters:  04/01/19 240 lb (108.9 kg)  08/26/18 250 lb 1.6 oz (113.4 kg)  06/27/18 244 lb (110.7 kg)     Exam:    Vital Signs:  There were no vitals taken for this visit.   Exam not performed due to telephone visit  ASSESSMENT & PLAN:    1. Mixed dyslipidemia, goal LDL less than 70  2. CAD without prior intervention 3. Chronic diastolic congestive heart failure-LVEF 60 to 65% 4. History of breast cancer  Mrs. Seery had significant intolerance to atorvastatin.  Her lipid profile overall is improved and this does not likely represent the medication as she has been off of it for  more than 2 months.  Her LDL is at 87.  Her target is less than 70.  I still think she would benefit from a statin which would be the cheapest option for her and also the less invasive one.  She is not interested in injections.  Since she was able to make significant dietary changes and improve her cholesterol, I think she could reach target LDL less than 70 on low-dose rosuvastatin.  This would also be better tolerated side effect wise.  I recommend starting 5 mg every other day.  We will repeat a lipid profile in about 3 months.  COVID-19 Education: The signs and symptoms of COVID-19 were discussed with the patient and how to seek care for testing (follow up with PCP or arrange E-visit).  The importance of social distancing was discussed today.  Patient Risk:   After full review of this patients clinical status, I feel that they are at least moderate risk at this time.  Time:   Today, I have spent 25 minutes with the patient with telehealth technology discussing this lipidemia, PCSK9 inhibitors, cardiovascular risk and recommendations.     Medication Adjustments/Labs and Tests Ordered: Current medicines are reviewed at length with the patient today.  Concerns regarding medicines are outlined above.   Tests Ordered: Orders Placed This Encounter  Procedures   Lipid panel    Medication Changes: Meds ordered this encounter  Medications   rosuvastatin (CRESTOR) 5 MG tablet    Sig: Take 1 tablet (5 mg total) by mouth every other day.    Dispense:  45 tablet    Refill:  3    Disposition:  in 3 month(s)  Pixie Casino, MD, [facility], [place] Director of the Advanced Lipid Disorders &  Cardiovascular Risk Reduction Clinic Diplomate of the American Board of Clinical Lipidology Attending Cardiologist  Direct Dial: [phone]   Fax: [phone]  Website:  [email or web address]  Pixie Casino, MD  07/20/2019 10:50 AM

## 2019-08-26 NOTE — Progress Notes (Signed)
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   She continues on anastrozole, with good tolerance. She denies issues with hot flashes or vaginal dryness. She pays $30 for a 30-monthsupply.   Her most recent done density testing was performed on 06/13/2018. This showed a T-score of -0.5, which is considered normal.  Since her last visit, [facility]  She was told that it was normal.  We have requested a copy.    REVIEW OF SYSTEMS: PSteward Dronet[place] me [place] from [place].  She is being of course very careful and does not want to go back on significant immunosuppression for her rheumatoid arthritis.  Accordingly she has plenty of pain and all she is taking for this is Tylenol once or twice daily.  She is not able to exercise because of her back pain.  She has had no intercurrent fevers, rash, bleeding, unusual headaches, visual changes, cough, phlegm production, pleurisy or worsening shortness of breath.  She remains very deconditioned.  A detailed review of systems was otherwise stable   BREAST CANCER HISTORY: From the original intake note:  "Kiara Brown"  herself noted a change around her nipple on the right and brought it to medical attention. She [facility] The breast density was category C. In the right breast upper inner quadrant there was a 2.5 cm area of grouped pleomorphic calcifications correlating with the palpable mass. Also there was a 1.2 cm macrolobulated mass in the upper inner quadrant also in the palpable region. These masses were identifiable by ultrasound with the same dimensions. The right axilla was sonographically negative.  On 05/27/2017 the patient underwent biopsy of the 2 masses in question. 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  The patient's subsequent history is as detailed below.   PAST MEDICAL HISTORY: Past Medical History:  Diagnosis Date  . Arthritis    osteo, rheumatoid  . [place]   . Diverticulosis   . Dysrhythmia    hx rapid heartbeat, no cardiologist  . Family history of breast cancer   . Family history of prostate cancer   . Fibromyalgia    peripheral neuropathy  . Fibromyalgia   . GERD (gastroesophageal reflux disease)   . H/O hiatal hernia   . Hyperlipidemia   . PONV (postoperative nausea and vomiting)    "  THROAT WITH EXTREME ZOXWRUE"4540, ulnar nerve surgery  . Sciatica   . Shortness of breath dyspnea    with exertion  . UTI (lower urinary tract infection)     PAST SURGICAL HISTORY: Past Surgical History:  Procedure Laterality Date  . ABDOMINAL HYSTERECTOMY    .  APPENDECTOMY    . back injection      July 2018- x1  . BREAST SURGERY Left 1986   biopsy, Recent 05/2017 at [facility]- had biopsy on R breast- malignant   . CARDIAC CATHETERIZATION  2009   "no blockage per pt"  . CHOLECYSTECTOMY    . CYSTOSCOPY WITH BIOPSY    . INCONTINENCE SURGERY    . IRRIGATION AND DEBRIDEMENT ABSCESS Right 01/28/2018   Procedure: IRRIGATION AND DEBRIDEMENT AXILLARY ABSCESS;  Surgeon: Excell Seltzer, MD;  Location: [facility];  Service: General;  Laterality: Right;  . JOINT REPLACEMENT    . KNEE ARTHROSCOPY     bilateral  . KNEE ARTHROSCOPY Left 03/17/2014   Procedure: ARTHROSCOPY LEFT KNEE WITH SYNOVECTOMY;  Surgeon: Gearlean Alf, MD;  Location: [facility];  Service: Orthopedics;  Laterality: Left;  . PORTACATH PLACEMENT Left 07/02/2017   Procedure: INSERTION PORT-A-CATH;  Surgeon: Excell Seltzer, MD;  Location: [place];  Service: General;  Laterality: Left;  . RIGHT/LEFT HEART CATH AND CORONARY ANGIOGRAPHY N/A 08/30/2017   Procedure: RIGHT/LEFT HEART CATH AND CORONARY ANGIOGRAPHY;  Surgeon: Larey Dresser, MD;  Location: [facility];  Service: Cardiovascular;  Laterality: N/A;  . ROTATOR CUFF REPAIR     right  . SIMPLE MASTECTOMY WITH AXILLARY SENTINEL NODE BIOPSY Right 07/02/2017   Procedure: RIGHT TOTAL  MASTECTOMY WITH AXILLARY SENTINEL LYMPH  NODE BIOPSY;  Surgeon: Excell Seltzer, MD;  Location: [facility];  Service: General;  Laterality: Right;  . TOTAL KNEE ARTHROPLASTY  08/04/2012   Procedure: TOTAL KNEE ARTHROPLASTY;  Surgeon: Gearlean Alf, MD;  Location: [facility];  Service: Orthopedics;  Laterality: Left;  . TOTAL KNEE ARTHROPLASTY Right 04/11/2015   Procedure: RIGHT TOTAL KNEE ARTHROPLASTY;  Surgeon: Gaynelle Arabian, MD;  Location: [facility];  Service: Orthopedics;  Laterality: Right;  . ulnar nerve surgery Right 1986    FAMILY HISTORY Family History  Problem Relation Age of Onset  . Hypertension Mother   . Stroke Mother   . Breast cancer Mother 58        died at 98  . Hypertension Father   . Heart attack Father        died at 57  . Hypertension Brother        MI  . Prostate cancer Brother 5  . Hypertension Brother        MI  . Lung cancer Brother        possible lung cancer, spot found on lung being monitored  . Hypertension Brother        MI  . Non-Hodgkin's lymphoma Sister 50  . Breast cancer Other 48       is currently 90  . Breast cancer Other 55  . Cancer Cousin 62       type of cancer unk.   . Cancer Other        type unknown, eye/face   . Cancer Other 21       died from cancer in his 79's, type unknown   The patient's father died from a heart attack at age 84. The patient's mother was diagnosed with breast cancer at age 34 and died at age 73. The  patient had 5 brothers, 3 sisters. One sister had breast cancer at age 65. One brother was diagnosed with prostate cancer at age 53. One Brother died from lung cancer at age 74. One sister had non-Hodgkin's lymphoma diagnosed at age 39.    GYNECOLOGIC HISTORY:  No LMP recorded. Patient has had a hysterectomy.  menarche age 41, first live birth age 64. All 3 of her births were premature including 1 set of twins. One of the twins died a few hours after birth. She underwent hysterectomy in 1981 with unilateral salpingo-oophorectomy. 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  She has led groups as far away as [place] and [place] and has led many in the [place] cruises as well.  Her husband, Berneta Sages began a business installing fences which is now carried on by his son Dorothea Ogle. Berneta Sages is also the church organist. Son, Darnelle Maffucci is a truck Geophysicist/field seismologist in [employer]. The patient has one grandchild. She is a Tourist information centre manager.     ADVANCED DIRECTIVES: In place. The patient's husband is her healthcare power of attorney   HEALTH MAINTENANCE: Social History   Tobacco Use  . Smoking  status: Never Smoker  . Smokeless tobacco: Never Used  Substance Use Topics  . Alcohol use: No  . Drug use: No     Colonoscopy:2017 / [place]   PAP:  Bone density: 2019/ [facility] normal   Allergies  Allergen Reactions  . Atorvastatin Other (See Comments)    myalgias   . Duloxetine Other (See Comments)    UNSPECIFIED REACTION   . Nsaids Rash  . Oseltamivir Diarrhea and Other (See Comments)    Abdominal pain     Current Outpatient Medications  Medication Sig Dispense Refill  . acetaminophen (TYLENOL) 500 MG tablet Take 500 mg by mouth every 6 (six) hours as needed for moderate pain or headache.     . anastrozole (ARIMIDEX) 1 MG tablet TAKE 1 TABLET BY MOUTH EVERY DAY 90 tablet 0  . aspirin EC 81 MG tablet Take 81 mg by mouth daily.    Marland Kitchen atenolol (TENORMIN) 50 MG tablet Take 50 mg by mouth daily before breakfast.     . buPROPion (WELLBUTRIN XL) 150 MG 24 hr tablet Take 150 mg by mouth daily before breakfast.    . ferrous sulfate 325 (65 FE) MG tablet Take 1 tablet (325 mg total) by mouth 2 (two) times daily with a meal. 60 tablet 0  . folic acid (FOLVITE) 1 MG tablet Take 1 mg by mouth daily.    . furosemide (LASIX) 20 MG tablet Take 20 mg by mouth daily.     Marland Kitchen glipiZIDE (GLUCOTROL XL) 5 MG 24 hr tablet Take 1 tablet by mouth daily.    Marland Kitchen omeprazole (PRILOSEC) 40 MG capsule Take 40 mg by mouth every morning.    . Potassium Chloride ER 20 MEQ TBCR Take 20 mg by mouth daily at 2 PM. 30 tablet 6  . rosuvastatin (CRESTOR) 5 MG tablet Take 1 tablet (5 mg total) by mouth every other day. 45 tablet 3  . traMADol (ULTRAM) 50 MG tablet Take 50 mg by mouth every 6 (six) hours as needed.    . traZODone (DESYREL) 50 MG tablet Take 1 tablet by mouth at bedtime.     Current Facility-Administered Medications  Medication Dose Route Frequency Provider Last Rate Last Dose  . 0.9 %  sodium chloride infusion  500 mL Intravenous Once Nandigam, Venia Minks, MD        OBJECTIVE: Morbidly obese white  woman who appears stated age  78:   08/27/19 1159  BP: 133/70  Pulse: 71  Resp: 18  Temp: 98.5 F (36.9 C)  SpO2: 95%     Body mass index is 43.19 kg/m.   Wt Readings from Last 3 Encounters:  08/27/19 251 lb 9.6 oz (114.1 kg)  04/01/19 240 lb (108.9 kg)  08/26/18 250 lb 1.6 oz (113.4 kg)   ECOG FS:1 - Symptomatic but completely ambulatory  Sclerae unicteric, EOMs intact Wearing a mask No cervical or supraclavicular adenopathy Lungs no rales or rhonchi Heart regular rate and rhythm Abd soft, obese, nontender, positive bowel sounds MSK no focal spinal tenderness, no upper extremity lymphedema Neuro: nonfocal, well oriented, appropriate affect Breasts: Status post right mastectomy.  There is no evidence of chest wall recurrence.  The left breast is benign.  Both axillae are benign     LAB RESULTS:  CMP     Component Value Date/Time   NA 136 08/26/2018 1116   NA 131 (L) 09/06/2017 1018   K 4.2 08/26/2018 1116   K 4.2 09/06/2017 1018   CL 102 08/26/2018 1116   CO2 28 08/26/2018 1116   CO2 25 09/06/2017 1018   GLUCOSE 233 (H) 08/26/2018 1116   GLUCOSE 187 (H) 09/06/2017 1018   BUN 14 08/26/2018 1116   BUN 11.2 09/06/2017 1018   CREATININE 1.06 (H) 08/26/2018 1116   CREATININE 1.04 02/21/2018 1139   CREATININE 1.2 (H) 09/06/2017 1018   CALCIUM 9.1 08/26/2018 1116   CALCIUM 9.3 09/06/2017 1018   PROT 7.0 10/09/2018 0939   PROT 6.9 09/06/2017 1018   ALBUMIN 3.5 10/09/2018 0939   ALBUMIN 3.4 (L) 09/06/2017 1018   AST 22 10/09/2018 0939   AST 20 09/06/2017 1018   ALT 18 10/09/2018 0939   ALT 14 09/06/2017 1018   ALKPHOS 108 10/09/2018 0939   ALKPHOS 103 09/06/2017 1018   BILITOT 0.7 10/09/2018 0939   BILITOT 0.57 09/06/2017 1018   GFRNONAA 49 (L) 08/26/2018 1116   GFRNONAA 51 (L) 02/21/2018 1139   GFRAA 57 (L) 08/26/2018 1116   GFRAA 59 (L) 02/21/2018 1139    No results found for: TOTALPROTELP, ALBUMINELP, A1GS, A2GS, BETS, BETA2SER, GAMS, MSPIKE, SPEI   No results found for: Nils Pyle, [facility]  Lab Results  Component Value Date   WBC 8.7 08/27/2019   NEUTROABS 5.1 08/27/2019   HGB 11.4 (L) 08/27/2019   HCT 35.9 (L) 08/27/2019   MCV 86.7 08/27/2019   PLT 338 08/27/2019      Chemistry      Component Value Date/Time   NA 136 08/26/2018 1116   NA 131 (L) 09/06/2017 1018   K 4.2 08/26/2018 1116   K 4.2 09/06/2017 1018   CL 102 08/26/2018 1116   CO2 28 08/26/2018 1116   CO2 25 09/06/2017 1018   BUN 14 08/26/2018 1116   BUN 11.2 09/06/2017 1018   CREATININE 1.06 (H) 08/26/2018 1116   CREATININE 1.04 02/21/2018 1139   CREATININE 1.2 (H) 09/06/2017 1018      Component Value Date/Time   CALCIUM 9.1 08/26/2018 1116   CALCIUM 9.3 09/06/2017 1018   ALKPHOS 108 10/09/2018 0939   ALKPHOS 103 09/06/2017 1018   AST 22 10/09/2018 0939   AST 20 09/06/2017 1018   ALT 18 10/09/2018 0939   ALT 14 09/06/2017 1018  BILITOT 0.7 10/09/2018 0939   BILITOT 0.57 09/06/2017 1018       No results found for: LABCA2  No components found for: ALPFXT024  No results for input(s): INR in the last 168 hours.  Urinalysis    Component Value Date/Time   COLORURINE STRAW (A) 02/21/2018 1138   APPEARANCEUR CLEAR 02/21/2018 1138   LABSPEC 1.006 02/21/2018 1138   PHURINE 6.0 02/21/2018 1138   GLUCOSEU NEGATIVE 02/21/2018 1138   HGBUR SMALL (A) 02/21/2018 1138   BILIRUBINUR NEGATIVE 02/21/2018 1138   KETONESUR NEGATIVE 02/21/2018 1138   PROTEINUR NEGATIVE 02/21/2018 1138   UROBILINOGEN 0.2 04/06/2015 1332   NITRITE NEGATIVE 02/21/2018 1138   LEUKOCYTESUR NEGATIVE 02/21/2018 1138    STUDIES: No results found.   ELIGIBLE FOR AVAILABLE RESEARCH PROTOCOL: No  ASSESSMENT: 78 y.o. [place] woman status post right breast upper inner quadrant biopsy 2 on 05/27/2017 showing a clinical T1c N0, stage IA invasive ductal carcinoma, grade 3, estrogen and progesterone receptor positive, HER-2 amplified, with an MIB-1 of 60%   (1) genetics testing 07/14/2017 through the Multi-Cancer panel + Breast Cancer Panel found no deleterious mutations in  ALK, APC,AKT1, ATM, AXIN2,BAP1,  BARD1, BLM, BMPR1A, BRCA1, BRCA2, BRIP1, CASR, CDC73, CDH1, CDK4, CDKN1B, CDKN1C, CDKN2A (p14ARF), CDKN2A (p16INK4a), CEBPA, CHEK2, CTNNA1, DICER1, DIS3L2, EGFR (c.2369C>T, p.Thr790Met variant only), EPCAM (Deletion/duplication testing only), FH, FAM175A, FLCN, GATA2, GPC3, GREM1 (Promoter region deletion/duplication testing only), HOXB13 (c.251G>A, p.Gly84Glu), HRAS, KIT, MAX, MEN1, MET, MITF (c.952G>A, p.Glu318Lys variant only), MLH1, MSH2, MSH3, MSH6, MUTYH, NBN, NF1, NF2, NTHL1, PALB2, PDGFRA, PHOX2B, PMS2, POLD1, POLE, POT1, PRKAR1A, PTCH1, PTEN, RAD50, RAD51C, RAD51D, RB1, RECQL4, RET, RUNX1, SDHAF2, SDHA (sequence changes only), SDHB, SDHC, SDHD, SMAD4, SMARCA4, SMARCB1, SMARCE1, STK11, SUFU, TERC, TERT, TMEM127, TP53, TSC1, TSC2, VHL, WRN, WT1, FANCC,MRE11,PIK3CA,RINT1,XRCC2.  (a)  3 Variants of Uncertain significance (VUS) were identified.  BRCA1 c.2551G>A (p.Glu851Lys)----CHEK2 c.679G>A (p.Gly227Arg).  The CHECK 2 variant has been reported as possibly mosaic--- SDHA c.704T>C (p.Ile235Thr).  (2) anastrozole started 06/05/2017, to be continued and minimum of 5 years   (a) bone density 06/03/2018 showed a T score of -0.5 (normal).  (3) status post right mastectomy and sentinel lymph node sampling 07/02/2017 for an mpT2 pN0, stage IB invasive ductal carcinoma, grade 3, with negative margins.  (a) the patient opted against reconstruction  (4) trastuzumab for 6 months started 08/09/2017, discontinued after one dose with significant reaction  (a) baseline echocardiogram 06/19/2017 shows an excellent ejection fraction  (b) Echocardiogram 09/16/2017 showed an ejection fraction of 60-65%  (5) started lapatinib 12/02/2017-- to be continued 6 months  (a) echocardiogram 01/21/2018 shows an ejection fraction in the 60-65%.  (b) echocardiogram  06/02/2018 showed an ejection fraction in the 55-60% range   PLAN: Kiara Brown is now just over 2 years out from definitive surgery for her breast cancer with no evidence of disease recurrence.  This is very favorable.  She is tolerating anastrozole well and the plan will be to continue that a minimum of 5 years.  She will benefit from a repeat bone density next year.  If she has significant pain some days I explained she can take Aleve and Tylenol together up to 3 times a day with food and that generally controls even fairly severe pain quite well.  She only has a few more months before she can get a vaccine and then of course she can proceed to more immunosuppression.  She knows to call for any other issue that may develop before the next visit.  Magrinat, Virgie Dad, MD  08/27/19 12:16 PM Medical Oncology and Hematology [facility] [address] [place], [place] [address] Tel. [phone]    Fax. [phone]   I, Wilburn Mylar, am acting as scribe for Dr. Virgie Dad. Magrinat.  I, Lurline Del MD, have reviewed the above documentation for accuracy and completeness, and I agree with the above.

## 2019-08-27 ENCOUNTER — Other Ambulatory Visit: Payer: Self-pay

## 2019-08-27 ENCOUNTER — Inpatient Hospital Stay: Payer: Medicare Other | Attending: Oncology

## 2019-08-27 ENCOUNTER — Inpatient Hospital Stay (HOSPITAL_BASED_OUTPATIENT_CLINIC_OR_DEPARTMENT_OTHER): Payer: Medicare Other | Admitting: Oncology

## 2019-08-27 VITALS — BP 133/70 | HR 71 | Temp 98.5°F | Resp 18 | Ht 64.0 in | Wt 251.6 lb

## 2019-08-27 DIAGNOSIS — Z8042 Family history of malignant neoplasm of prostate: Secondary | ICD-10-CM | POA: Insufficient documentation

## 2019-08-27 DIAGNOSIS — Z9223 Personal history of estrogen therapy: Secondary | ICD-10-CM | POA: Insufficient documentation

## 2019-08-27 DIAGNOSIS — Z170 Estrogen receptor positive status [ER+]: Secondary | ICD-10-CM | POA: Insufficient documentation

## 2019-08-27 DIAGNOSIS — Z79811 Long term (current) use of aromatase inhibitors: Secondary | ICD-10-CM | POA: Diagnosis not present

## 2019-08-27 DIAGNOSIS — Z7982 Long term (current) use of aspirin: Secondary | ICD-10-CM | POA: Diagnosis not present

## 2019-08-27 DIAGNOSIS — C50211 Malignant neoplasm of upper-inner quadrant of right female breast: Secondary | ICD-10-CM | POA: Diagnosis not present

## 2019-08-27 DIAGNOSIS — Z803 Family history of malignant neoplasm of breast: Secondary | ICD-10-CM | POA: Diagnosis not present

## 2019-08-27 DIAGNOSIS — I2510 Atherosclerotic heart disease of native coronary artery without angina pectoris: Secondary | ICD-10-CM

## 2019-08-27 DIAGNOSIS — Z90710 Acquired absence of both cervix and uterus: Secondary | ICD-10-CM | POA: Insufficient documentation

## 2019-08-27 DIAGNOSIS — Z9079 Acquired absence of other genital organ(s): Secondary | ICD-10-CM | POA: Insufficient documentation

## 2019-08-27 DIAGNOSIS — Z807 Family history of other malignant neoplasms of lymphoid, hematopoietic and related tissues: Secondary | ICD-10-CM | POA: Insufficient documentation

## 2019-08-27 DIAGNOSIS — Z79899 Other long term (current) drug therapy: Secondary | ICD-10-CM | POA: Diagnosis not present

## 2019-08-27 DIAGNOSIS — Z7984 Long term (current) use of oral hypoglycemic drugs: Secondary | ICD-10-CM | POA: Insufficient documentation

## 2019-08-27 DIAGNOSIS — Z9011 Acquired absence of right breast and nipple: Secondary | ICD-10-CM | POA: Insufficient documentation

## 2019-08-27 DIAGNOSIS — Z801 Family history of malignant neoplasm of trachea, bronchus and lung: Secondary | ICD-10-CM | POA: Insufficient documentation

## 2019-08-27 DIAGNOSIS — Z90721 Acquired absence of ovaries, unilateral: Secondary | ICD-10-CM | POA: Insufficient documentation

## 2019-08-27 DIAGNOSIS — M069 Rheumatoid arthritis, unspecified: Secondary | ICD-10-CM | POA: Insufficient documentation

## 2019-08-27 LAB — COMPREHENSIVE METABOLIC PANEL
ALT: 11 U/L (ref 0–44)
AST: 13 U/L — ABNORMAL LOW (ref 15–41)
Albumin: 3.8 g/dL (ref 3.5–5.0)
Alkaline Phosphatase: 119 U/L (ref 38–126)
Anion gap: 10 (ref 5–15)
BUN: 20 mg/dL (ref 8–23)
CO2: 26 mmol/L (ref 22–32)
Calcium: 9.5 mg/dL (ref 8.9–10.3)
Chloride: 99 mmol/L (ref 98–111)
Creatinine, Ser: 1.16 mg/dL — ABNORMAL HIGH (ref 0.44–1.00)
GFR calc Af Amer: 52 mL/min — ABNORMAL LOW (ref >60)
GFR calc non Af Amer: 45 mL/min — ABNORMAL LOW (ref >60)
Glucose, Bld: 102 mg/dL — ABNORMAL HIGH (ref 70–99)
Potassium: 4.3 mmol/L (ref 3.5–5.1)
Sodium: 135 mmol/L (ref 135–145)
Total Bilirubin: 0.4 mg/dL (ref 0.3–1.2)
Total Protein: 7.5 g/dL (ref 6.5–8.1)

## 2019-08-27 LAB — CBC WITH DIFFERENTIAL/PLATELET
Abs Immature Granulocytes: 0.02 10*3/uL (ref 0.00–0.07)
Basophils Absolute: 0.1 10*3/uL (ref 0.0–0.1)
Basophils Relative: 1 %
Eosinophils Absolute: 0.1 10*3/uL (ref 0.0–0.5)
Eosinophils Relative: 1 %
HCT: 35.9 % — ABNORMAL LOW (ref 36.0–46.0)
Hemoglobin: 11.4 g/dL — ABNORMAL LOW (ref 12.0–15.0)
Immature Granulocytes: 0 %
Lymphocytes Relative: 29 %
Lymphs Abs: 2.6 10*3/uL (ref 0.7–4.0)
MCH: 27.5 pg (ref 26.0–34.0)
MCHC: 31.8 g/dL (ref 30.0–36.0)
MCV: 86.7 fL (ref 80.0–100.0)
Monocytes Absolute: 0.9 10*3/uL (ref 0.1–1.0)
Monocytes Relative: 11 %
Neutro Abs: 5.1 10*3/uL (ref 1.7–7.7)
Neutrophils Relative %: 58 %
Platelets: 338 10*3/uL (ref 150–400)
RBC: 4.14 MIL/uL (ref 3.87–5.11)
RDW: 14.2 % (ref 11.5–15.5)
WBC: 8.7 10*3/uL (ref 4.0–10.5)
nRBC: 0 % (ref 0.0–0.2)

## 2019-08-27 MED ORDER — ANASTROZOLE 1 MG PO TABS: 1.0000 mg | ORAL_TABLET | Freq: Every day | ORAL | 4 refills | Status: DC

## 2019-08-28 ENCOUNTER — Telehealth: Payer: Self-pay | Admitting: Oncology

## 2019-08-28 NOTE — Telephone Encounter (Signed)
I talk with patient regarding schedule  

## 2019-09-04 DIAGNOSIS — M0609 Rheumatoid arthritis without rheumatoid factor, multiple sites: Secondary | ICD-10-CM | POA: Diagnosis not present

## 2019-09-04 DIAGNOSIS — Z79899 Other long term (current) drug therapy: Secondary | ICD-10-CM | POA: Diagnosis not present

## 2019-09-04 DIAGNOSIS — M5136 Other intervertebral disc degeneration, lumbar region: Secondary | ICD-10-CM | POA: Diagnosis not present

## 2019-09-29 DIAGNOSIS — N3000 Acute cystitis without hematuria: Secondary | ICD-10-CM | POA: Diagnosis not present

## 2019-09-29 DIAGNOSIS — Z23 Encounter for immunization: Secondary | ICD-10-CM | POA: Diagnosis not present

## 2019-11-05 DIAGNOSIS — Z171 Estrogen receptor negative status [ER-]: Secondary | ICD-10-CM | POA: Diagnosis not present

## 2019-11-05 DIAGNOSIS — N3281 Overactive bladder: Secondary | ICD-10-CM | POA: Diagnosis not present

## 2019-11-05 DIAGNOSIS — C50211 Malignant neoplasm of upper-inner quadrant of right female breast: Secondary | ICD-10-CM | POA: Diagnosis not present

## 2019-11-05 DIAGNOSIS — M62838 Other muscle spasm: Secondary | ICD-10-CM | POA: Diagnosis not present

## 2019-11-05 DIAGNOSIS — R300 Dysuria: Secondary | ICD-10-CM | POA: Diagnosis not present

## 2019-11-05 DIAGNOSIS — M5412 Radiculopathy, cervical region: Secondary | ICD-10-CM | POA: Diagnosis not present

## 2019-11-05 DIAGNOSIS — M4312 Spondylolisthesis, cervical region: Secondary | ICD-10-CM | POA: Diagnosis not present

## 2019-11-05 DIAGNOSIS — M542 Cervicalgia: Secondary | ICD-10-CM | POA: Diagnosis not present

## 2019-11-14 DIAGNOSIS — R399 Unspecified symptoms and signs involving the genitourinary system: Secondary | ICD-10-CM | POA: Diagnosis not present

## 2019-11-14 DIAGNOSIS — N3000 Acute cystitis without hematuria: Secondary | ICD-10-CM | POA: Diagnosis not present

## 2019-11-14 DIAGNOSIS — I10 Essential (primary) hypertension: Secondary | ICD-10-CM | POA: Diagnosis not present

## 2019-11-14 DIAGNOSIS — B370 Candidal stomatitis: Secondary | ICD-10-CM | POA: Diagnosis not present

## 2019-11-18 DIAGNOSIS — M5030 Other cervical disc degeneration, unspecified cervical region: Secondary | ICD-10-CM | POA: Diagnosis not present

## 2019-11-18 DIAGNOSIS — M5412 Radiculopathy, cervical region: Secondary | ICD-10-CM | POA: Diagnosis not present

## 2019-11-18 DIAGNOSIS — M4312 Spondylolisthesis, cervical region: Secondary | ICD-10-CM | POA: Diagnosis not present

## 2019-12-08 DIAGNOSIS — Z87440 Personal history of urinary (tract) infections: Secondary | ICD-10-CM | POA: Diagnosis not present

## 2019-12-08 DIAGNOSIS — N3281 Overactive bladder: Secondary | ICD-10-CM | POA: Diagnosis not present

## 2019-12-08 IMAGING — DX DG RIBS W/ CHEST 3+V*R*
4 series · 4 of 4 positions shown · non-contrast
Comparison: Chest radiograph 08/10/2017, CT chest 08/11/2017

CLINICAL DATA: RIGHT lower back pain and bruising, fall 4 days ago

EXAM:
RIGHT RIBS AND CHEST - 3+ VIEW

[rib pa]
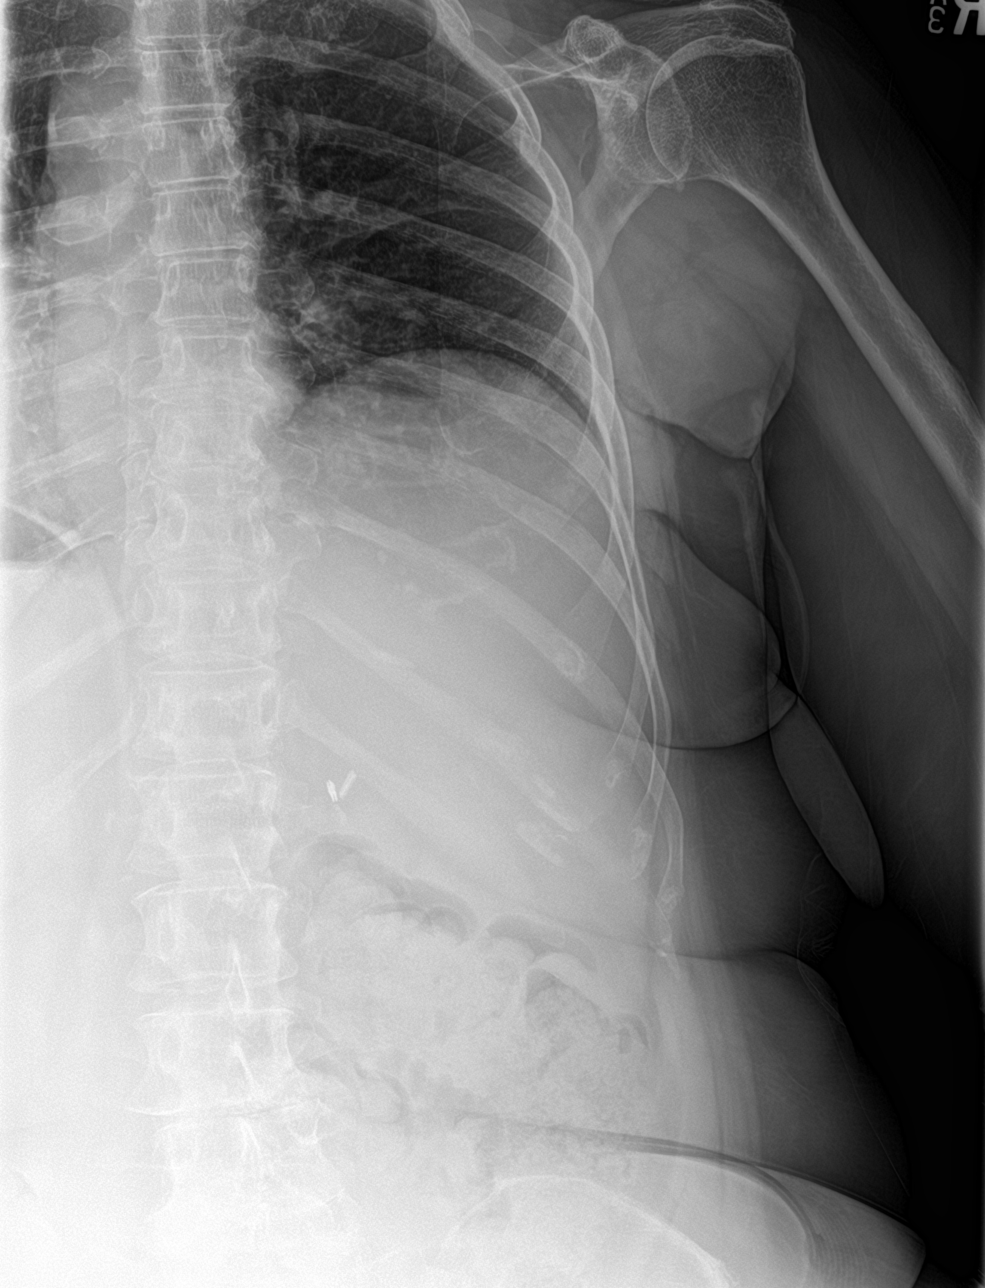

[rib pa obl (1 of 2)]
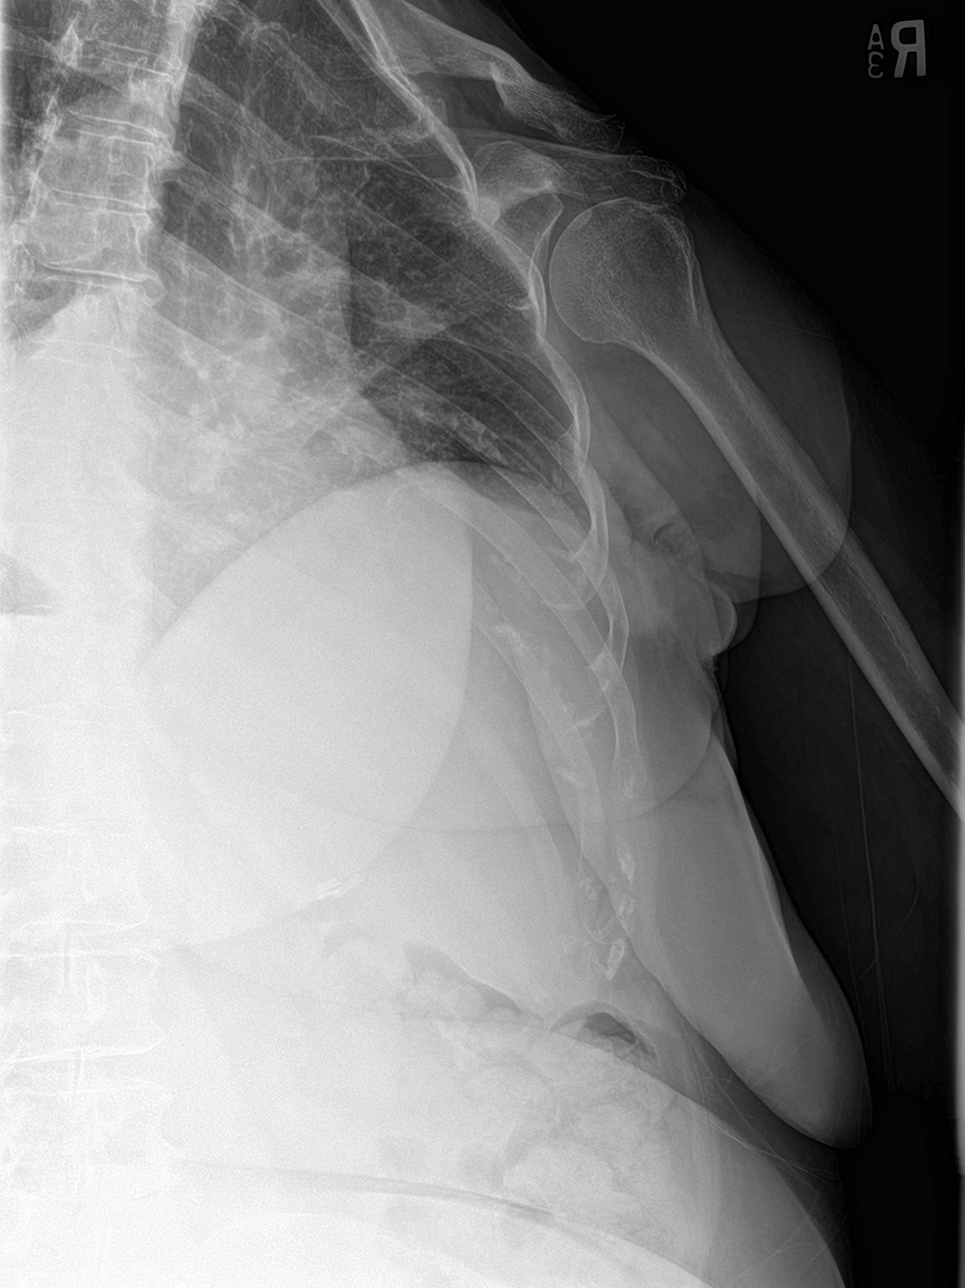

[rib pa obl (2 of 2)]
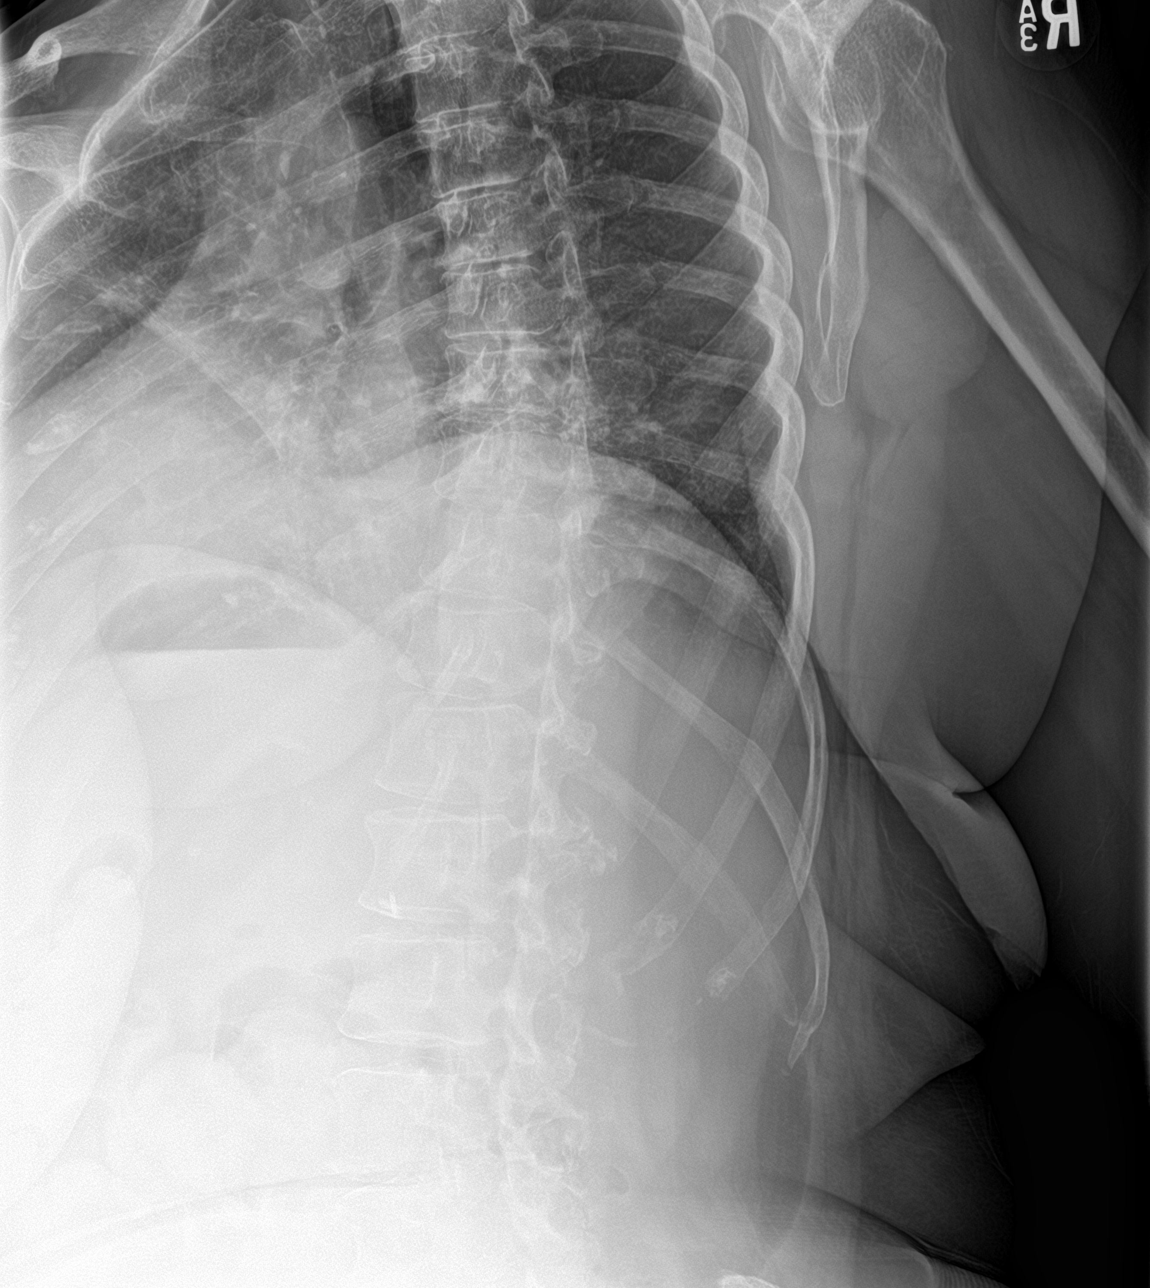

[chest pa]
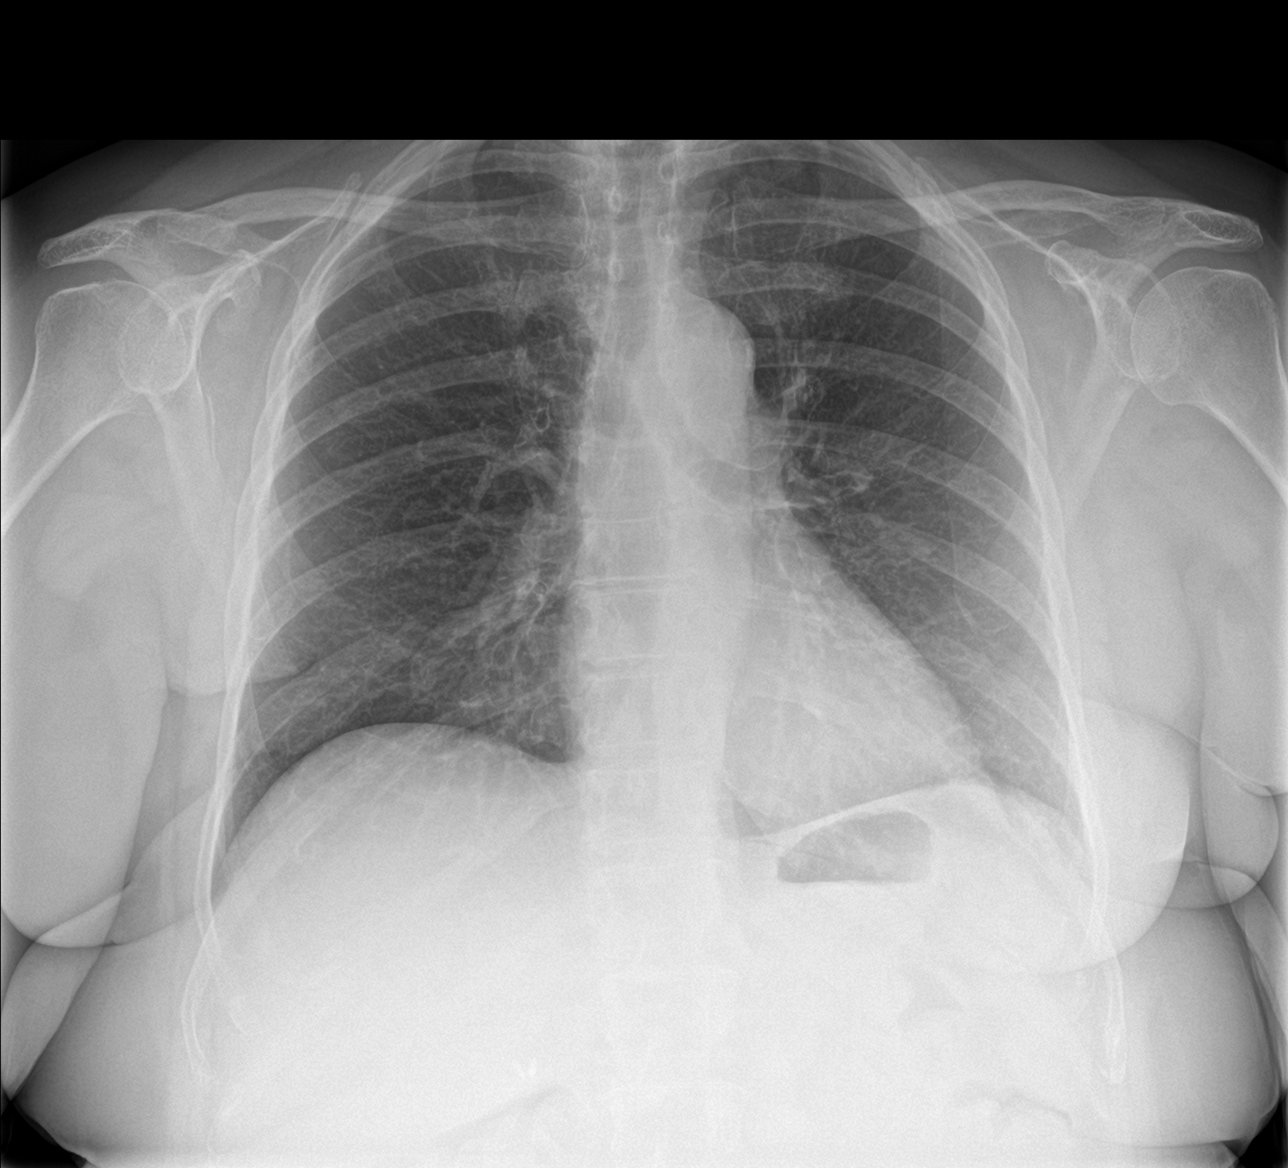

[4 of 4 positions shown; findings below may reference images not displayed]

FINDINGS: Normal heart size, mediastinal contours, and pulmonary vascularity.

Atherosclerotic calcification aorta.

Bronchitic changes without infiltrate, pleural effusion or
pneumothorax.

Osseous mineralization normal.

No rib fracture or bone destruction.
IMPRESSION: Bronchitic changes without infiltrate.

No acute RIGHT rib abnormalities.

## 2020-01-11 DIAGNOSIS — M0609 Rheumatoid arthritis without rheumatoid factor, multiple sites: Secondary | ICD-10-CM | POA: Diagnosis not present

## 2020-01-11 DIAGNOSIS — M159 Polyosteoarthritis, unspecified: Secondary | ICD-10-CM | POA: Diagnosis not present

## 2020-01-11 DIAGNOSIS — M5030 Other cervical disc degeneration, unspecified cervical region: Secondary | ICD-10-CM | POA: Diagnosis not present

## 2020-01-12 DIAGNOSIS — R0789 Other chest pain: Secondary | ICD-10-CM | POA: Diagnosis not present

## 2020-01-12 DIAGNOSIS — I110 Hypertensive heart disease with heart failure: Secondary | ICD-10-CM | POA: Diagnosis not present

## 2020-01-12 DIAGNOSIS — I10 Essential (primary) hypertension: Secondary | ICD-10-CM | POA: Diagnosis not present

## 2020-01-12 DIAGNOSIS — E782 Mixed hyperlipidemia: Secondary | ICD-10-CM | POA: Diagnosis not present

## 2020-01-12 DIAGNOSIS — E1159 Type 2 diabetes mellitus with other circulatory complications: Secondary | ICD-10-CM | POA: Diagnosis not present

## 2020-01-12 DIAGNOSIS — I5032 Chronic diastolic (congestive) heart failure: Secondary | ICD-10-CM | POA: Diagnosis not present

## 2020-01-12 DIAGNOSIS — I2510 Atherosclerotic heart disease of native coronary artery without angina pectoris: Secondary | ICD-10-CM | POA: Diagnosis not present

## 2020-01-12 DIAGNOSIS — M1990 Unspecified osteoarthritis, unspecified site: Secondary | ICD-10-CM | POA: Diagnosis not present

## 2020-01-12 DIAGNOSIS — M0579 Rheumatoid arthritis with rheumatoid factor of multiple sites without organ or systems involvement: Secondary | ICD-10-CM | POA: Diagnosis not present

## 2020-01-12 DIAGNOSIS — F5101 Primary insomnia: Secondary | ICD-10-CM | POA: Diagnosis not present

## 2020-01-12 DIAGNOSIS — E611 Iron deficiency: Secondary | ICD-10-CM | POA: Diagnosis not present

## 2020-01-12 DIAGNOSIS — C50211 Malignant neoplasm of upper-inner quadrant of right female breast: Secondary | ICD-10-CM | POA: Diagnosis not present

## 2020-01-28 DIAGNOSIS — M542 Cervicalgia: Secondary | ICD-10-CM | POA: Diagnosis not present

## 2020-01-28 DIAGNOSIS — M62838 Other muscle spasm: Secondary | ICD-10-CM | POA: Diagnosis not present

## 2020-01-28 DIAGNOSIS — M5412 Radiculopathy, cervical region: Secondary | ICD-10-CM | POA: Diagnosis not present

## 2020-01-28 DIAGNOSIS — F411 Generalized anxiety disorder: Secondary | ICD-10-CM | POA: Diagnosis not present

## 2020-01-28 DIAGNOSIS — M4312 Spondylolisthesis, cervical region: Secondary | ICD-10-CM | POA: Diagnosis not present

## 2020-01-28 DIAGNOSIS — M5030 Other cervical disc degeneration, unspecified cervical region: Secondary | ICD-10-CM | POA: Diagnosis not present

## 2020-02-05 DIAGNOSIS — N3281 Overactive bladder: Secondary | ICD-10-CM | POA: Diagnosis not present

## 2020-02-05 DIAGNOSIS — N3001 Acute cystitis with hematuria: Secondary | ICD-10-CM | POA: Diagnosis not present

## 2020-02-05 DIAGNOSIS — R300 Dysuria: Secondary | ICD-10-CM | POA: Diagnosis not present

## 2020-02-05 DIAGNOSIS — R3915 Urgency of urination: Secondary | ICD-10-CM | POA: Diagnosis not present

## 2020-02-05 DIAGNOSIS — R350 Frequency of micturition: Secondary | ICD-10-CM | POA: Diagnosis not present

## 2020-02-05 DIAGNOSIS — R3989 Other symptoms and signs involving the genitourinary system: Secondary | ICD-10-CM | POA: Diagnosis not present

## 2020-02-05 DIAGNOSIS — R399 Unspecified symptoms and signs involving the genitourinary system: Secondary | ICD-10-CM | POA: Diagnosis not present

## 2020-02-13 DIAGNOSIS — M542 Cervicalgia: Secondary | ICD-10-CM | POA: Diagnosis not present

## 2020-02-13 DIAGNOSIS — M62838 Other muscle spasm: Secondary | ICD-10-CM | POA: Diagnosis not present

## 2020-02-13 DIAGNOSIS — M5412 Radiculopathy, cervical region: Secondary | ICD-10-CM | POA: Diagnosis not present

## 2020-02-13 DIAGNOSIS — M5030 Other cervical disc degeneration, unspecified cervical region: Secondary | ICD-10-CM | POA: Diagnosis not present

## 2020-02-13 DIAGNOSIS — M4312 Spondylolisthesis, cervical region: Secondary | ICD-10-CM | POA: Diagnosis not present

## 2020-02-21 ENCOUNTER — Encounter (HOSPITAL_COMMUNITY): Payer: Self-pay

## 2020-02-21 ENCOUNTER — Other Ambulatory Visit: Payer: Self-pay

## 2020-02-21 ENCOUNTER — Emergency Department (HOSPITAL_COMMUNITY): Payer: Medicare Other

## 2020-02-21 ENCOUNTER — Inpatient Hospital Stay (HOSPITAL_COMMUNITY)
Admission: EM | Admit: 2020-02-21 | Discharge: 2020-02-24 | DRG: 872 | Disposition: A | Discharge status: Home / Self Care | Payer: Medicare Other | Attending: Internal Medicine | Admitting: Internal Medicine

## 2020-02-21 DIAGNOSIS — Z9181 History of falling: Secondary | ICD-10-CM

## 2020-02-21 DIAGNOSIS — N3281 Overactive bladder: Secondary | ICD-10-CM | POA: Diagnosis present

## 2020-02-21 DIAGNOSIS — K219 Gastro-esophageal reflux disease without esophagitis: Secondary | ICD-10-CM | POA: Diagnosis present

## 2020-02-21 DIAGNOSIS — F32A Depression, unspecified: Secondary | ICD-10-CM

## 2020-02-21 DIAGNOSIS — E1122 Type 2 diabetes mellitus with diabetic chronic kidney disease: Secondary | ICD-10-CM | POA: Diagnosis present

## 2020-02-21 DIAGNOSIS — Z20822 Contact with and (suspected) exposure to covid-19: Secondary | ICD-10-CM | POA: Diagnosis present

## 2020-02-21 DIAGNOSIS — R0902 Hypoxemia: Secondary | ICD-10-CM | POA: Diagnosis not present

## 2020-02-21 DIAGNOSIS — I2583 Coronary atherosclerosis due to lipid rich plaque: Secondary | ICD-10-CM | POA: Diagnosis not present

## 2020-02-21 DIAGNOSIS — C50911 Malignant neoplasm of unspecified site of right female breast: Secondary | ICD-10-CM | POA: Diagnosis not present

## 2020-02-21 DIAGNOSIS — I10 Essential (primary) hypertension: Secondary | ICD-10-CM | POA: Diagnosis not present

## 2020-02-21 DIAGNOSIS — M25562 Pain in left knee: Secondary | ICD-10-CM | POA: Diagnosis not present

## 2020-02-21 DIAGNOSIS — R509 Fever, unspecified: Secondary | ICD-10-CM | POA: Diagnosis not present

## 2020-02-21 DIAGNOSIS — F329 Major depressive disorder, single episode, unspecified: Secondary | ICD-10-CM | POA: Diagnosis present

## 2020-02-21 DIAGNOSIS — R112 Nausea with vomiting, unspecified: Secondary | ICD-10-CM | POA: Diagnosis present

## 2020-02-21 DIAGNOSIS — N390 Urinary tract infection, site not specified: Secondary | ICD-10-CM | POA: Diagnosis present

## 2020-02-21 DIAGNOSIS — Z6841 Body Mass Index (BMI) 40.0 and over, adult: Secondary | ICD-10-CM

## 2020-02-21 DIAGNOSIS — M797 Fibromyalgia: Secondary | ICD-10-CM | POA: Diagnosis present

## 2020-02-21 DIAGNOSIS — Z823 Family history of stroke: Secondary | ICD-10-CM

## 2020-02-21 DIAGNOSIS — N182 Chronic kidney disease, stage 2 (mild): Secondary | ICD-10-CM | POA: Diagnosis present

## 2020-02-21 DIAGNOSIS — Z7984 Long term (current) use of oral hypoglycemic drugs: Secondary | ICD-10-CM

## 2020-02-21 DIAGNOSIS — E119 Type 2 diabetes mellitus without complications: Secondary | ICD-10-CM | POA: Diagnosis not present

## 2020-02-21 DIAGNOSIS — R0689 Other abnormalities of breathing: Secondary | ICD-10-CM | POA: Diagnosis not present

## 2020-02-21 DIAGNOSIS — R197 Diarrhea, unspecified: Secondary | ICD-10-CM | POA: Diagnosis not present

## 2020-02-21 DIAGNOSIS — Z79891 Long term (current) use of opiate analgesic: Secondary | ICD-10-CM

## 2020-02-21 DIAGNOSIS — E861 Hypovolemia: Secondary | ICD-10-CM | POA: Diagnosis present

## 2020-02-21 DIAGNOSIS — A419 Sepsis, unspecified organism: Secondary | ICD-10-CM | POA: Diagnosis present

## 2020-02-21 DIAGNOSIS — R55 Syncope and collapse: Secondary | ICD-10-CM

## 2020-02-21 DIAGNOSIS — Z170 Estrogen receptor positive status [ER+]: Secondary | ICD-10-CM

## 2020-02-21 DIAGNOSIS — Z8042 Family history of malignant neoplasm of prostate: Secondary | ICD-10-CM

## 2020-02-21 DIAGNOSIS — C50211 Malignant neoplasm of upper-inner quadrant of right female breast: Secondary | ICD-10-CM | POA: Diagnosis present

## 2020-02-21 DIAGNOSIS — E860 Dehydration: Secondary | ICD-10-CM | POA: Diagnosis present

## 2020-02-21 DIAGNOSIS — I2510 Atherosclerotic heart disease of native coronary artery without angina pectoris: Secondary | ICD-10-CM

## 2020-02-21 DIAGNOSIS — Z79899 Other long term (current) drug therapy: Secondary | ICD-10-CM

## 2020-02-21 DIAGNOSIS — Z803 Family history of malignant neoplasm of breast: Secondary | ICD-10-CM

## 2020-02-21 DIAGNOSIS — Z79811 Long term (current) use of aromatase inhibitors: Secondary | ICD-10-CM

## 2020-02-21 DIAGNOSIS — R52 Pain, unspecified: Secondary | ICD-10-CM

## 2020-02-21 DIAGNOSIS — M069 Rheumatoid arthritis, unspecified: Secondary | ICD-10-CM | POA: Diagnosis present

## 2020-02-21 DIAGNOSIS — I5032 Chronic diastolic (congestive) heart failure: Secondary | ICD-10-CM | POA: Diagnosis present

## 2020-02-21 DIAGNOSIS — I130 Hypertensive heart and chronic kidney disease with heart failure and stage 1 through stage 4 chronic kidney disease, or unspecified chronic kidney disease: Secondary | ICD-10-CM | POA: Diagnosis present

## 2020-02-21 DIAGNOSIS — E1142 Type 2 diabetes mellitus with diabetic polyneuropathy: Secondary | ICD-10-CM | POA: Diagnosis present

## 2020-02-21 DIAGNOSIS — Z96653 Presence of artificial knee joint, bilateral: Secondary | ICD-10-CM | POA: Diagnosis present

## 2020-02-21 DIAGNOSIS — Z90710 Acquired absence of both cervix and uterus: Secondary | ICD-10-CM

## 2020-02-21 DIAGNOSIS — Z886 Allergy status to analgesic agent status: Secondary | ICD-10-CM

## 2020-02-21 DIAGNOSIS — R1110 Vomiting, unspecified: Secondary | ICD-10-CM

## 2020-02-21 DIAGNOSIS — S8992XA Unspecified injury of left lower leg, initial encounter: Secondary | ICD-10-CM | POA: Diagnosis not present

## 2020-02-21 DIAGNOSIS — Z9011 Acquired absence of right breast and nipple: Secondary | ICD-10-CM

## 2020-02-21 DIAGNOSIS — R000 Tachycardia, unspecified: Secondary | ICD-10-CM | POA: Diagnosis not present

## 2020-02-21 DIAGNOSIS — E785 Hyperlipidemia, unspecified: Secondary | ICD-10-CM | POA: Diagnosis present

## 2020-02-21 DIAGNOSIS — Z9049 Acquired absence of other specified parts of digestive tract: Secondary | ICD-10-CM | POA: Diagnosis not present

## 2020-02-21 DIAGNOSIS — Z807 Family history of other malignant neoplasms of lymphoid, hematopoietic and related tissues: Secondary | ICD-10-CM

## 2020-02-21 DIAGNOSIS — Z8249 Family history of ischemic heart disease and other diseases of the circulatory system: Secondary | ICD-10-CM

## 2020-02-21 DIAGNOSIS — Z7982 Long term (current) use of aspirin: Secondary | ICD-10-CM

## 2020-02-21 DIAGNOSIS — Z87440 Personal history of urinary (tract) infections: Secondary | ICD-10-CM

## 2020-02-21 DIAGNOSIS — Z888 Allergy status to other drugs, medicaments and biological substances status: Secondary | ICD-10-CM

## 2020-02-21 LAB — COMPREHENSIVE METABOLIC PANEL
ALT: 16 U/L (ref 0–44)
AST: 22 U/L (ref 15–41)
Albumin: 3.6 g/dL (ref 3.5–5.0)
Alkaline Phosphatase: 107 U/L (ref 38–126)
Anion gap: 10 (ref 5–15)
BUN: 21 mg/dL (ref 8–23)
CO2: 22 mmol/L (ref 22–32)
Calcium: 8.8 mg/dL — ABNORMAL LOW (ref 8.9–10.3)
Chloride: 101 mmol/L (ref 98–111)
Creatinine, Ser: 1.02 mg/dL — ABNORMAL HIGH (ref 0.44–1.00)
GFR calc Af Amer: >60 mL/min (ref >60)
GFR calc non Af Amer: 53 mL/min — ABNORMAL LOW (ref >60)
Glucose, Bld: 142 mg/dL — ABNORMAL HIGH (ref 70–99)
Potassium: 4.4 mmol/L (ref 3.5–5.1)
Sodium: 133 mmol/L — ABNORMAL LOW (ref 135–145)
Total Bilirubin: 0.8 mg/dL (ref 0.3–1.2)
Total Protein: 7.1 g/dL (ref 6.5–8.1)

## 2020-02-21 LAB — URINALYSIS, ROUTINE W REFLEX MICROSCOPIC
Bilirubin Urine: NEGATIVE
Glucose, UA: NEGATIVE mg/dL
Ketones, ur: NEGATIVE mg/dL
Nitrite: POSITIVE — AB
Protein, ur: NEGATIVE mg/dL
Specific Gravity, Urine: 1.014 (ref 1.005–1.030)
pH: 5 (ref 5.0–8.0)

## 2020-02-21 LAB — PROTIME-INR
INR: 1.1 (ref 0.8–1.2)
Prothrombin Time: 13.7 seconds (ref 11.4–15.2)

## 2020-02-21 LAB — CBC WITH DIFFERENTIAL/PLATELET
Abs Immature Granulocytes: 0.12 10*3/uL — ABNORMAL HIGH (ref 0.00–0.07)
Basophils Absolute: 0 10*3/uL (ref 0.0–0.1)
Basophils Relative: 0 %
Eosinophils Absolute: 0 10*3/uL (ref 0.0–0.5)
Eosinophils Relative: 0 %
HCT: 35.2 % — ABNORMAL LOW (ref 36.0–46.0)
Hemoglobin: 11.2 g/dL — ABNORMAL LOW (ref 12.0–15.0)
Immature Granulocytes: 1 %
Lymphocytes Relative: 3 %
Lymphs Abs: 0.5 10*3/uL — ABNORMAL LOW (ref 0.7–4.0)
MCH: 27.1 pg (ref 26.0–34.0)
MCHC: 31.8 g/dL (ref 30.0–36.0)
MCV: 85 fL (ref 80.0–100.0)
Monocytes Absolute: 0.8 10*3/uL (ref 0.1–1.0)
Monocytes Relative: 4 %
Neutro Abs: 19.1 10*3/uL — ABNORMAL HIGH (ref 1.7–7.7)
Neutrophils Relative %: 92 %
Platelets: 353 10*3/uL (ref 150–400)
RBC: 4.14 MIL/uL (ref 3.87–5.11)
RDW: 14.7 % (ref 11.5–15.5)
WBC: 20.6 10*3/uL — ABNORMAL HIGH (ref 4.0–10.5)
nRBC: 0 % (ref 0.0–0.2)

## 2020-02-21 LAB — APTT: aPTT: 30 seconds (ref 24–36)

## 2020-02-21 LAB — RESPIRATORY PANEL BY RT PCR (FLU A&B, COVID)
Influenza A by PCR: NEGATIVE
Influenza B by PCR: NEGATIVE
SARS Coronavirus 2 by RT PCR: NEGATIVE

## 2020-02-21 LAB — LIPASE, BLOOD: Lipase: 29 U/L (ref 11–51)

## 2020-02-21 LAB — LACTIC ACID, PLASMA
Lactic Acid, Venous: 2.3 mmol/L (ref 0.5–1.9)
Lactic Acid, Venous: 2.6 mmol/L (ref 0.5–1.9)

## 2020-02-21 MED ORDER — LACTATED RINGERS IV BOLUS
1500.0000 mL | Freq: Once | INTRAVENOUS | Status: AC
  Administered 2020-02-21: 20:00:00 1500 mL via INTRAVENOUS

## 2020-02-21 MED ORDER — VANCOMYCIN HCL 2000 MG/400ML IV SOLN
2000.0000 mg | INTRAVENOUS | Status: AC
  Administered 2020-02-21: 2000 mg via INTRAVENOUS
  Filled 2020-02-21: qty 400

## 2020-02-21 MED ORDER — ACETAMINOPHEN 500 MG PO TABS
1000.0000 mg | ORAL_TABLET | Freq: Once | ORAL | Status: AC
  Administered 2020-02-21: 21:00:00 1000 mg via ORAL
  Filled 2020-02-21: qty 2

## 2020-02-21 MED ORDER — METRONIDAZOLE IN NACL 5-0.79 MG/ML-% IV SOLN
500.0000 mg | Freq: Once | INTRAVENOUS | Status: AC
  Administered 2020-02-22: 01:00:00 500 mg via INTRAVENOUS
  Filled 2020-02-21: qty 100

## 2020-02-21 MED ORDER — LACTATED RINGERS IV BOLUS
1000.0000 mL | Freq: Once | INTRAVENOUS | Status: AC
  Administered 2020-02-21: 19:00:00 1000 mL via INTRAVENOUS

## 2020-02-21 MED ORDER — SODIUM CHLORIDE 0.9 % IV SOLN
2.0000 g | Freq: Once | INTRAVENOUS | Status: AC
  Administered 2020-02-21: 20:00:00 2 g via INTRAVENOUS
  Filled 2020-02-21: qty 2

## 2020-02-21 MED ORDER — VANCOMYCIN HCL IN DEXTROSE 1-5 GM/200ML-% IV SOLN: 1000.0000 mg | Freq: Once | INTRAVENOUS | Status: DC

## 2020-02-21 NOTE — ED Notes (Signed)
PTs POC would like to updated when possible. Number is [phone].

## 2020-02-21 NOTE — Progress Notes (Signed)
A consult was received from an ED physician for Vancomycin and Cefepime per pharmacy dosing.  The patient's profile has been reviewed for ht/wt/allergies/indication/available labs.    A one time order has been placed for Vancomycin 2g IV and Cefepime 2g IV.  Further antibiotics/pharmacy consults should be ordered by admitting physician if indicated.                       Thank you, Luiz Ochoa 02/21/2020  7:44 PM

## 2020-02-21 NOTE — ED Provider Notes (Signed)
Kiara Brown Provider Note   CSN: [id number] Arrival date & time: 02/21/20  1724     History Chief Complaint  Patient presents with  . Nausea  . Diarrhea    Kiara Brown is a 79 y.o. female.  HPI Patient reports she had a active day yesterday.  She was with her grandchildren and went to visit the old family home.  She did not feel sick yesterday but reports by the end of the day she was very tired but expected that.  She reports she does have history of bladder spasms and frequent UTI.  She developed symptoms this morning with a lot of burning and took a Pyridium tablet.  She is not sure if she had allergic reaction of the Pyridium or she was just getting a urinary tract infection but by mid morning she developed frequent episodes of vomiting and diarrhea.  She reports she was back and forth out of the bathroom for a number of hours having recurrent episodes.  Finally about 2 in the afternoon, she was on the toilet and felt urgency to have a bowel movement which she did but also had dry heaves at the same time.  She was trying to vomit into a trash can but reports that she passed out and woke up on the floor.  She reports she was still having diarrheal stool.  Her hair was all wet.  It took about 5 hours before family members found her to call 911.  She reports that she has been having chills but has not measured a fever.  She reports she does have some lower abdominal discomfort but not significant or severe pain.  She denies she has any headache.  No neck pain.  No focal weakness numbness or tingling of extremities.  She does not feel that she sustained any injury from falling off the toilet.  She reports she must of just rolled off very gently.  Patient given 1 L normal saline by EMS during transport.    Past Medical History:  Diagnosis Date  . Arthritis    osteo, rheumatoid  . Breast cancer ([place])   . Diverticulosis   . Dysrhythmia    hx rapid  heartbeat, no cardiologist  . Family history of breast cancer   . Family history of prostate cancer   . Fibromyalgia    peripheral neuropathy  . Fibromyalgia   . GERD (gastroesophageal reflux disease)   . H/O hiatal hernia   . Hyperlipidemia   . PONV (postoperative nausea and vomiting)    "THROAT WITH EXTREME [id number], ulnar nerve surgery  . Sciatica   . Shortness of breath dyspnea    with exertion  . UTI (lower urinary tract infection)     Patient Active Problem List   Diagnosis Date Noted  . Abscess 01/27/2018  . Severe obesity ([place]) 12/02/2017  . Anemia, normocytic normochromic 08/10/2017  . Chest pain 08/10/2017  . Essential hypertension 08/10/2017  . CKD (chronic kidney disease), 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  . Type II diabetes mellitus ([place]) 08/10/2017  . Genetic testing 07/10/2017  . Stage I breast cancer, right ([place]) 07/02/2017  . Family history of breast cancer   . Family history of prostate cancer   . Malignant neoplasm of upper-inner quadrant of right breast in female, estrogen receptor positive ([place]) 05/31/2017  . Synovitis of knee 03/16/2014  . Postop Hypokalemia 08/07/2012  . Postop Hyponatremia 08/06/2012  . OA (osteoarthritis) of knee 08/04/2012    Past Surgical  History:  Procedure Laterality Date  . ABDOMINAL HYSTERECTOMY    . APPENDECTOMY    . back injection      July 2018- x1  . BREAST SURGERY Left 1986   biopsy, Recent 05/2017 at [facility]- had biopsy on R breast- malignant   . CARDIAC CATHETERIZATION  2009   "no blockage per pt"  . CHOLECYSTECTOMY    . CYSTOSCOPY WITH BIOPSY    . INCONTINENCE SURGERY    . IRRIGATION AND DEBRIDEMENT ABSCESS Right 01/28/2018   Procedure: IRRIGATION AND DEBRIDEMENT AXILLARY ABSCESS;  Surgeon: Excell Seltzer, MD;  Location: [facility];  Service: General;  Laterality: Right;  . JOINT REPLACEMENT    . KNEE ARTHROSCOPY     bilateral  . KNEE ARTHROSCOPY Left 03/17/2014   Procedure: ARTHROSCOPY LEFT KNEE WITH SYNOVECTOMY;   Surgeon: Gearlean Alf, MD;  Location: [facility];  Service: Orthopedics;  Laterality: Left;  . PORTACATH PLACEMENT Left 07/02/2017   Procedure: INSERTION PORT-A-CATH;  Surgeon: Excell Seltzer, MD;  Location: [facility];  Service: General;  Laterality: Left;  . RIGHT/LEFT HEART CATH AND CORONARY ANGIOGRAPHY N/A 08/30/2017   Procedure: RIGHT/LEFT HEART CATH AND CORONARY ANGIOGRAPHY;  Surgeon: Larey Dresser, MD;  Location: [facility];  Service: Cardiovascular;  Laterality: N/A;  . ROTATOR CUFF REPAIR     right  . SIMPLE MASTECTOMY WITH AXILLARY SENTINEL NODE BIOPSY Right 07/02/2017   Procedure: RIGHT TOTAL  MASTECTOMY WITH AXILLARY SENTINEL LYMPH  NODE BIOPSY;  Surgeon: Excell Seltzer, MD;  Location: [facility];  Service: General;  Laterality: Right;  . TOTAL KNEE ARTHROPLASTY  08/04/2012   Procedure: TOTAL KNEE ARTHROPLASTY;  Surgeon: Gearlean Alf, MD;  Location: [facility];  Service: Orthopedics;  Laterality: Left;  . TOTAL KNEE ARTHROPLASTY Right 04/11/2015   Procedure: RIGHT TOTAL KNEE ARTHROPLASTY;  Surgeon: Gaynelle Arabian, MD;  Location: [facility];  Service: Orthopedics;  Laterality: Right;  . ulnar nerve surgery Right 1986     OB History   No obstetric history on file.     Family History  Problem Relation Age of Onset  . Hypertension Mother   . Stroke Mother   . Breast cancer Mother 16       died at 55  . Hypertension Father   . Heart attack Father        died at 57  . Hypertension Brother        MI  . Prostate cancer Brother 47  . Hypertension Brother        MI  . Lung cancer Brother        possible lung cancer, spot found on lung being monitored  . Hypertension Brother        MI  . Non-Hodgkin's lymphoma Sister 44  . Breast cancer Other 37       is currently 90  . Breast cancer Other 55  . Cancer Cousin 62       type of cancer unk.   . Cancer Other        type unknown, eye/face   . Cancer Other 13       died from cancer in his 5's, type unknown    Social History     Tobacco Use  . Smoking status: Never Smoker  . Smokeless tobacco: Never Used  Substance Use Topics  . Alcohol use: No  . Drug use: No    Home Medications Prior to Admission medications   Medication Sig Start Date End Date Taking? Authorizing Provider  acetaminophen (TYLENOL) 500 MG tablet Take 500 mg by mouth every 6 (six) hours as needed for moderate pain or headache.     [provider]  anastrozole (ARIMIDEX) 1 MG tablet Take 1 tablet (1 mg total) by mouth daily. 08/27/19   Magrinat, Virgie Dad, MD  aspirin EC 81 MG tablet Take 81 mg by mouth daily.    [provider]  atenolol (TENORMIN) 50 MG tablet Take 50 mg by mouth daily before breakfast.     [provider]  buPROPion (WELLBUTRIN XL) 150 MG 24 hr tablet Take 150 mg by mouth daily before breakfast.    [provider]  FEROSUL 325 (65 Fe) MG tablet TAKE 1 TABLET BY MOUTH TWICE DAILY WITH A MEAL 09/14/19   Nandigam, Venia Minks, MD  ferrous sulfate 325 (65 FE) MG tablet Take 1 tablet (325 mg total) by mouth 2 (two) times daily with a meal. 06/15/19   Nandigam, Venia Minks, MD  folic acid (FOLVITE) 1 MG tablet Take 1 mg by mouth daily.    [provider]  furosemide (LASIX) 20 MG tablet Take 20 mg by mouth daily.     [provider]  glipiZIDE (GLUCOTROL XL) 5 MG 24 hr tablet Take 1 tablet by mouth daily. 06/15/19   [provider]  omeprazole (PRILOSEC) 40 MG capsule Take 40 mg by mouth every morning.    [provider]  Potassium Chloride ER 20 MEQ TBCR Take 20 mg by mouth daily at 2 PM. 09/03/18   Larey Dresser, MD  rosuvastatin (CRESTOR) 5 MG tablet Take 1 tablet (5 mg total) by mouth every other day. 07/20/19   Hilty, Nadean Corwin, MD  traMADol (ULTRAM) 50 MG tablet Take 50 mg by mouth every 6 (six) hours as needed.    [provider]  traZODone (DESYREL) 50 MG tablet Take 1 tablet by mouth at bedtime. 04/09/19   [provider]    Allergies     Atorvastatin, Duloxetine, Nsaids, and Oseltamivir  Review of Systems   Review of Systems 10 Systems reviewed and are negative for acute change except as noted in the HPI.  Physical Exam Updated Vital Signs BP (!) 115/44   Pulse (!) 107   Temp 100.2 F (37.9 C) (Oral)   Resp (!) 21   Ht 5\' 3"  (1.6 m)   Wt 113.4 kg   SpO2 93%   BMI 44.29 kg/m   Physical Exam Constitutional:      Comments: Alert with clear mental status.  Patient is having chills.  No respiratory distress.  HENT:     Head: Normocephalic and atraumatic.     Mouth/Throat:     Comments: Mucous membranes are dry. Eyes:     Extraocular Movements: Extraocular movements intact.     Conjunctiva/sclera: Conjunctivae normal.     Pupils: Pupils are equal, round, and reactive to light.  Cardiovascular:     Pulses: Normal pulses.     Heart sounds: Normal heart sounds.     Comments: Borderline tachycardia.  No rub murmur gallop. Pulmonary:     Effort: Pulmonary effort is normal.     Breath sounds: Normal breath sounds.  Abdominal:     General: There is no distension.     Palpations: Abdomen is soft.     Tenderness: There is no abdominal tenderness. There is no guarding.  Musculoskeletal:        General: No swelling or deformity. Normal range of motion.  Cervical back: Neck supple.     Right lower leg: No edema.     Left lower leg: No edema.  Skin:    General: Skin is warm and dry.     Coloration: Skin is pale.  Neurological:     General: No focal deficit present.     Mental Status: She is oriented to person, place, and time.     Cranial Nerves: No cranial nerve deficit.     Coordination: Coordination normal.  Psychiatric:        Mood and Affect: Mood normal.     ED Results / Procedures / Treatments   Labs (all labs ordered are listed, but only abnormal results are displayed) Labs Reviewed  LACTIC ACID, PLASMA - Abnormal; Notable for the following components:      Result Value   Lactic Acid, Venous  2.3 (*)    All other components within normal limits  LACTIC ACID, PLASMA - Abnormal; Notable for the following components:   Lactic Acid, Venous 2.6 (*)    All other components within normal limits  COMPREHENSIVE METABOLIC PANEL - Abnormal; Notable for the following components:   Sodium 133 (*)    Glucose, Bld 142 (*)    Creatinine, Ser 1.02 (*)    Calcium 8.8 (*)    GFR calc non Af Amer 53 (*)    All other components within normal limits  CBC WITH DIFFERENTIAL/PLATELET - Abnormal; Notable for the following components:   WBC 20.6 (*)    Hemoglobin 11.2 (*)    HCT 35.2 (*)    Neutro Abs 19.1 (*)    Lymphs Abs 0.5 (*)    Abs Immature Granulocytes 0.12 (*)    All other components within normal limits  URINALYSIS, ROUTINE W REFLEX MICROSCOPIC - Abnormal; Notable for the following components:   Color, Urine AMBER (*)    Hgb urine dipstick MODERATE (*)    Nitrite POSITIVE (*)    Leukocytes,Ua MODERATE (*)    Bacteria, UA RARE (*)    All other components within normal limits  RESPIRATORY PANEL BY RT PCR (FLU A&B, COVID)  CULTURE, BLOOD (ROUTINE X 2)  CULTURE, BLOOD (ROUTINE X 2)  URINE CULTURE  GI PATHOGEN PANEL BY PCR, STOOL  C DIFFICILE QUICK SCREEN W PCR REFLEX  APTT  PROTIME-INR  LIPASE, BLOOD    EKG None MUSE will not import the EKG tracing read.  Sinus tachycardia.  Motion artifact.  Otherwise similar to prior EKG. Radiology DG Chest Port 1 View  Result Date: 02/21/2020 CLINICAL DATA:  Fever. EXAM: PORTABLE CHEST 1 VIEW COMPARISON:  07/02/2017 FINDINGS: The heart size is stable from prior study. Aortic calcifications are noted. There is no pneumothorax or large pleural effusion. There is some atelectasis versus scarring at the lung bases, left worse than right. There is no acute osseous abnormality. IMPRESSION: No active disease. Electronically Signed   By: Constance Holster M.D.   On: 02/21/2020 18:14    Procedures Procedures (including critical care  time) CRITICAL CARE Performed by: Charlesetta Shanks   Total critical care time: 30 minutes  Critical care time was exclusive of separately billable procedures and treating other patients.  Critical care was necessary to treat or prevent imminent or life-threatening deterioration.  Critical care was time spent personally by me on the following activities: development of treatment plan with patient and/or surrogate as well as nursing, discussions with consultants, evaluation of patient's response to treatment, examination of patient, obtaining history from patient or surrogate,  ordering and performing treatments and interventions, ordering and review of laboratory studies, ordering and review of radiographic studies, pulse oximetry and re-evaluation of patient's condition.  Medications Ordered in ED Medications  metroNIDAZOLE (FLAGYL) IVPB 500 mg (has no administration in time range)  lactated ringers bolus 1,000 mL (0 mLs Intravenous Stopped 02/21/20 2027)  lactated ringers bolus 1,500 mL (0 mLs Intravenous Stopped 02/21/20 2344)  ceFEPIme (MAXIPIME) 2 g in sodium chloride 0.9 % 100 mL IVPB (0 g Intravenous Stopped 02/21/20 2116)  vancomycin (VANCOREADY) IVPB 2000 mg/400 mL (0 mg Intravenous Stopped 02/21/20 2344)  acetaminophen (TYLENOL) tablet 1,000 mg (1,000 mg Oral Given 02/21/20 2113)    ED Course  I have reviewed the triage vital signs and the nursing notes.  Pertinent labs & imaging results that were available during my care of the patient were reviewed by me and considered in my medical decision making (see chart for details).  Clinical Course as of Feb 22 7  Sun Feb 21, 2020  2049 Awaiting urinalysis results for disposition.  Blood pressures are stable.  Patient remains mildly tachycardic.   [MP]  2233 Consult: Dr.Tu for admission   [MP]    Clinical Course User Index [MP] Charlesetta Shanks, MD   MDM Rules/Calculators/A&P                      Patient presents aligned above.   She does have positive urinalysis.  She also had copious vomiting and diarrhea.  Patient has syncopal episode without injury.  Sepsis protocol initiated.  Patient's mental status is clear.  Respiratory status is stable.  Patient will be admitted for ongoing treatment.  Final Clinical Impression(s) / ED Diagnoses Final diagnoses:  Vomiting and diarrhea  Hypovolemia  Syncope, unspecified syncope type  Urinary tract infection without hematuria, site unspecified    Rx / DC Orders ED Discharge Orders    None       Charlesetta Shanks, MD 02/22/20 0010

## 2020-02-21 NOTE — ED Triage Notes (Signed)
Per EMS, Pt having N/V/D apprx 9 hrs ago, along with weakness, and burning with urination. Pt also had a syncopal episode this morning and fell to the floor. Pt layed there for apprx 5 hrs before family found pt and called 911. Increased respirations but denies SOB. Hx of UTI's, and currently being treated for it.

## 2020-02-21 NOTE — Progress Notes (Signed)
Difficulty with IV and lab sticks

## 2020-02-21 NOTE — ED Notes (Signed)
Date and time results received: 02/21/20 7:31 PM  (use smartphrase ".now" to insert current time)  Test: Lactic Acid Critical Value: 2.3  Name of Provider Notified: Dr.Pfeiffer  Orders Received? Or Actions Taken?:

## 2020-02-22 ENCOUNTER — Other Ambulatory Visit: Payer: Self-pay

## 2020-02-22 ENCOUNTER — Encounter (HOSPITAL_COMMUNITY): Payer: Self-pay | Admitting: Family Medicine

## 2020-02-22 DIAGNOSIS — Z9011 Acquired absence of right breast and nipple: Secondary | ICD-10-CM | POA: Diagnosis not present

## 2020-02-22 DIAGNOSIS — F329 Major depressive disorder, single episode, unspecified: Secondary | ICD-10-CM

## 2020-02-22 DIAGNOSIS — I2510 Atherosclerotic heart disease of native coronary artery without angina pectoris: Secondary | ICD-10-CM

## 2020-02-22 DIAGNOSIS — K219 Gastro-esophageal reflux disease without esophagitis: Secondary | ICD-10-CM | POA: Diagnosis present

## 2020-02-22 DIAGNOSIS — N390 Urinary tract infection, site not specified: Secondary | ICD-10-CM

## 2020-02-22 DIAGNOSIS — I10 Essential (primary) hypertension: Secondary | ICD-10-CM

## 2020-02-22 DIAGNOSIS — I2583 Coronary atherosclerosis due to lipid rich plaque: Secondary | ICD-10-CM

## 2020-02-22 DIAGNOSIS — N182 Chronic kidney disease, stage 2 (mild): Secondary | ICD-10-CM | POA: Diagnosis present

## 2020-02-22 DIAGNOSIS — N3281 Overactive bladder: Secondary | ICD-10-CM | POA: Diagnosis present

## 2020-02-22 DIAGNOSIS — A419 Sepsis, unspecified organism: Secondary | ICD-10-CM | POA: Diagnosis not present

## 2020-02-22 DIAGNOSIS — C50211 Malignant neoplasm of upper-inner quadrant of right female breast: Secondary | ICD-10-CM | POA: Diagnosis present

## 2020-02-22 DIAGNOSIS — E861 Hypovolemia: Secondary | ICD-10-CM | POA: Diagnosis present

## 2020-02-22 DIAGNOSIS — Z6841 Body Mass Index (BMI) 40.0 and over, adult: Secondary | ICD-10-CM | POA: Diagnosis not present

## 2020-02-22 DIAGNOSIS — E1142 Type 2 diabetes mellitus with diabetic polyneuropathy: Secondary | ICD-10-CM | POA: Diagnosis present

## 2020-02-22 DIAGNOSIS — E119 Type 2 diabetes mellitus without complications: Secondary | ICD-10-CM

## 2020-02-22 DIAGNOSIS — E785 Hyperlipidemia, unspecified: Secondary | ICD-10-CM | POA: Diagnosis present

## 2020-02-22 DIAGNOSIS — E860 Dehydration: Secondary | ICD-10-CM | POA: Diagnosis present

## 2020-02-22 DIAGNOSIS — R112 Nausea with vomiting, unspecified: Secondary | ICD-10-CM | POA: Diagnosis present

## 2020-02-22 DIAGNOSIS — E1122 Type 2 diabetes mellitus with diabetic chronic kidney disease: Secondary | ICD-10-CM | POA: Diagnosis present

## 2020-02-22 DIAGNOSIS — C50911 Malignant neoplasm of unspecified site of right female breast: Secondary | ICD-10-CM

## 2020-02-22 DIAGNOSIS — Z9049 Acquired absence of other specified parts of digestive tract: Secondary | ICD-10-CM | POA: Diagnosis not present

## 2020-02-22 DIAGNOSIS — F32A Depression, unspecified: Secondary | ICD-10-CM

## 2020-02-22 DIAGNOSIS — M069 Rheumatoid arthritis, unspecified: Secondary | ICD-10-CM | POA: Diagnosis present

## 2020-02-22 DIAGNOSIS — Z20822 Contact with and (suspected) exposure to covid-19: Secondary | ICD-10-CM | POA: Diagnosis present

## 2020-02-22 DIAGNOSIS — I130 Hypertensive heart and chronic kidney disease with heart failure and stage 1 through stage 4 chronic kidney disease, or unspecified chronic kidney disease: Secondary | ICD-10-CM | POA: Diagnosis present

## 2020-02-22 DIAGNOSIS — M797 Fibromyalgia: Secondary | ICD-10-CM | POA: Diagnosis present

## 2020-02-22 DIAGNOSIS — Z170 Estrogen receptor positive status [ER+]: Secondary | ICD-10-CM | POA: Diagnosis not present

## 2020-02-22 DIAGNOSIS — I5032 Chronic diastolic (congestive) heart failure: Secondary | ICD-10-CM | POA: Diagnosis present

## 2020-02-22 DIAGNOSIS — Z96653 Presence of artificial knee joint, bilateral: Secondary | ICD-10-CM | POA: Diagnosis present

## 2020-02-22 HISTORY — DX: Depression, unspecified: F32.A

## 2020-02-22 LAB — CBC
HCT: 30.2 % — ABNORMAL LOW (ref 36.0–46.0)
Hemoglobin: 9.3 g/dL — ABNORMAL LOW (ref 12.0–15.0)
MCH: 26.2 pg (ref 26.0–34.0)
MCHC: 30.8 g/dL (ref 30.0–36.0)
MCV: 85.1 fL (ref 80.0–100.0)
Platelets: 284 10*3/uL (ref 150–400)
RBC: 3.55 MIL/uL — ABNORMAL LOW (ref 3.87–5.11)
RDW: 14.8 % (ref 11.5–15.5)
WBC: 14.2 10*3/uL — ABNORMAL HIGH (ref 4.0–10.5)
nRBC: 0 % (ref 0.0–0.2)

## 2020-02-22 LAB — BASIC METABOLIC PANEL
Anion gap: 8 (ref 5–15)
BUN: 15 mg/dL (ref 8–23)
CO2: 21 mmol/L — ABNORMAL LOW (ref 22–32)
Calcium: 8.3 mg/dL — ABNORMAL LOW (ref 8.9–10.3)
Chloride: 104 mmol/L (ref 98–111)
Creatinine, Ser: 0.96 mg/dL (ref 0.44–1.00)
GFR calc Af Amer: >60 mL/min (ref >60)
GFR calc non Af Amer: 57 mL/min — ABNORMAL LOW (ref >60)
Glucose, Bld: 116 mg/dL — ABNORMAL HIGH (ref 70–99)
Potassium: 3.9 mmol/L (ref 3.5–5.1)
Sodium: 133 mmol/L — ABNORMAL LOW (ref 135–145)

## 2020-02-22 LAB — URINE CULTURE

## 2020-02-22 LAB — LACTIC ACID, PLASMA
Lactic Acid, Venous: 1.6 mmol/L (ref 0.5–1.9)
Lactic Acid, Venous: 1.3 mmol/L (ref 0.5–1.9)

## 2020-02-22 LAB — GLUCOSE, CAPILLARY
Glucose-Capillary: 102 mg/dL — ABNORMAL HIGH (ref 70–99)
Glucose-Capillary: 147 mg/dL — ABNORMAL HIGH (ref 70–99)
Glucose-Capillary: 127 mg/dL — ABNORMAL HIGH (ref 70–99)
Glucose-Capillary: 152 mg/dL — ABNORMAL HIGH (ref 70–99)

## 2020-02-22 LAB — HEMOGLOBIN A1C
Hgb A1c MFr Bld: 7 % — ABNORMAL HIGH (ref 4.8–5.6)
Mean Plasma Glucose: 154.2 mg/dL

## 2020-02-22 MED ORDER — ROSUVASTATIN CALCIUM 5 MG PO TABS
5.0000 mg | ORAL_TABLET | ORAL | Status: DC
  Administered 2020-02-22 – 2020-02-24 (×2): 5 mg via ORAL
  Filled 2020-02-22 (×2): qty 1

## 2020-02-22 MED ORDER — PANTOPRAZOLE SODIUM 40 MG PO TBEC
40.0000 mg | DELAYED_RELEASE_TABLET | Freq: Every day | ORAL | Status: DC
  Administered 2020-02-22 – 2020-02-24 (×3): 40 mg via ORAL
  Filled 2020-02-22 (×3): qty 1

## 2020-02-22 MED ORDER — FOLIC ACID 1 MG PO TABS
1.0000 mg | ORAL_TABLET | Freq: Every day | ORAL | Status: DC
  Administered 2020-02-22 – 2020-02-24 (×3): 1 mg via ORAL
  Filled 2020-02-22 (×3): qty 1

## 2020-02-22 MED ORDER — ENOXAPARIN SODIUM 40 MG/0.4ML ~~LOC~~ SOLN
40.0000 mg | SUBCUTANEOUS | Status: DC
  Administered 2020-02-22 – 2020-02-24 (×3): 40 mg via SUBCUTANEOUS
  Filled 2020-02-22 (×3): qty 0.4

## 2020-02-22 MED ORDER — ONDANSETRON HCL 4 MG/2ML IJ SOLN: 4.0000 mg | Freq: Four times a day (QID) | INTRAMUSCULAR | Status: DC | PRN

## 2020-02-22 MED ORDER — BUPROPION HCL ER (XL) 150 MG PO TB24
150.0000 mg | ORAL_TABLET | Freq: Every day | ORAL | Status: DC
  Administered 2020-02-22 – 2020-02-24 (×3): 150 mg via ORAL
  Filled 2020-02-22 (×3): qty 1

## 2020-02-22 MED ORDER — ASPIRIN EC 81 MG PO TBEC
81.0000 mg | DELAYED_RELEASE_TABLET | Freq: Every day | ORAL | Status: DC
  Administered 2020-02-22 – 2020-02-24 (×3): 81 mg via ORAL
  Filled 2020-02-22 (×3): qty 1

## 2020-02-22 MED ORDER — SODIUM CHLORIDE 0.9 % IV SOLN: INTRAVENOUS | Status: DC

## 2020-02-22 MED ORDER — SODIUM CHLORIDE 0.9 % IV SOLN
2.0000 g | Freq: Two times a day (BID) | INTRAVENOUS | Status: DC
  Administered 2020-02-22 – 2020-02-23 (×4): 2 g via INTRAVENOUS
  Filled 2020-02-22 (×5): qty 2

## 2020-02-22 MED ORDER — FERROUS SULFATE 325 (65 FE) MG PO TABS
325.0000 mg | ORAL_TABLET | Freq: Every day | ORAL | Status: DC
  Administered 2020-02-22 – 2020-02-24 (×3): 325 mg via ORAL
  Filled 2020-02-22 (×3): qty 1

## 2020-02-22 MED ORDER — INSULIN ASPART 100 UNIT/ML ~~LOC~~ SOLN
0.0000 [IU] | Freq: Three times a day (TID) | SUBCUTANEOUS | Status: DC
  Administered 2020-02-22 (×2): 1 [IU] via SUBCUTANEOUS
  Administered 2020-02-23: 13:00:00 2 [IU] via SUBCUTANEOUS
  Administered 2020-02-23 – 2020-02-24 (×2): 1 [IU] via SUBCUTANEOUS
  Filled 2020-02-22: qty 0.09

## 2020-02-22 MED ORDER — ACETAMINOPHEN 325 MG PO TABS
650.0000 mg | ORAL_TABLET | Freq: Four times a day (QID) | ORAL | Status: DC | PRN
  Administered 2020-02-22: 650 mg via ORAL
  Filled 2020-02-22: qty 2

## 2020-02-22 MED ORDER — ANASTROZOLE 1 MG PO TABS
1.0000 mg | ORAL_TABLET | Freq: Every day | ORAL | Status: DC
  Administered 2020-02-22 – 2020-02-24 (×3): 1 mg via ORAL
  Filled 2020-02-22 (×3): qty 1

## 2020-02-22 MED ORDER — TRAZODONE HCL 50 MG PO TABS
50.0000 mg | ORAL_TABLET | Freq: Every day | ORAL | Status: DC
  Administered 2020-02-22 – 2020-02-23 (×2): 50 mg via ORAL
  Filled 2020-02-22 (×2): qty 1

## 2020-02-22 MED ORDER — ATENOLOL 50 MG PO TABS
50.0000 mg | ORAL_TABLET | Freq: Every day | ORAL | Status: DC
  Administered 2020-02-22 – 2020-02-24 (×3): 50 mg via ORAL
  Filled 2020-02-22 (×3): qty 1

## 2020-02-22 NOTE — H&P (Signed)
History and Physical    Kiara Brown [id number] DOB: 1941/03/28 DOA: 02/21/2020  PCP: Burman Freestone, MD  Patient coming from: Home  I have personally briefly reviewed patient's old medical records in [place]  Chief Complaint: syncope, N/V/D  HPI: Kiara Brown is a 79 y.o. female with medical history significant for chronic diastolic heart failure, right breast cancer s/p mastectomy on anastrozole, type 2 diabetes, rheumatoid arthritis, CAD and hypertension who presents with concerns of syncope and nausea, vomiting and diarrhea.  Patient woke up yesterday and noted dysuria and then took an Azo pill.  Then a few hours later she began to noticed persistent vomiting and diarrhea.  She was on the toilet having an episode of diarrhea when she recently also had to vomit.  Then the next thing she recalls was waking up on the floor with her hair all with because she was diaphoretic.  She was not sure for how long she had a loss of consciousness.  Her husband then later came home and found her on the floor and she just told him to leave her there so that she could rest.  Has been gave her a pillow and she was actually there for several hours until her son later came and prompted her to present to the ED.  Patient reports that she has been dealing with UTI on and off for the past 6 to 8 weeks.  She has gone through 2 different antibiotics in the past 2 months.  She denies ever being told that her urine cultures never grew back any bacteria.  Denies any fever.  Denies any sick contact.  Patient denies any tobacco, alcohol or illicit drug use.  ED Course: She was febrile up to 100.5, tachycardic and tachypneic on room air.  WBC of 20.6.  Lactic acid of 2.3 and then 2.6.  Sodium of 133, glucose of 140, creatinine slightly elevated at 1.02.  This shows positive nitrite and moderate leukocyte with rare bacteria and many WBCs. Chest x-ray negative.  She was started on vancomycin, cefepime  and Flagyl in the ED.  She was also given 2.5 L of IV LR.  Review of Systems: Constitutional: No Weight Change, No Fever ENT/Mouth: No sore throat, No Rhinorrhea Eyes: No Eye Pain, No Vision Changes Cardiovascular: No Chest Pain, no SOB Respiratory: No Cough, No Sputum Gastrointestinal: + Nausea, + Vomiting, +no Diarrhea, No Constipation, No Pain Genitourinary: no Urinary Incontinence Musculoskeletal: No Arthralgias, No Myalgias Skin: No Skin Lesions, No Pruritus, Neuro: no Weakness, No Numbness,  + Loss of Consciousness,+ Syncope Psych: No Anxiety/Panic, No Depression, no decrease appetite Heme/Lymph: No Bruising, No Bleeding  Past Medical History:  Diagnosis Date  . Arthritis    osteo, rheumatoid  . Breast cancer ([place])   . Diverticulosis   . Dysrhythmia    hx rapid heartbeat, no cardiologist  . Family history of breast cancer   . Family history of prostate cancer   . Fibromyalgia    peripheral neuropathy  . Fibromyalgia   . GERD (gastroesophageal reflux disease)   . H/O hiatal hernia   . Hyperlipidemia   . PONV (postoperative nausea and vomiting)    "THROAT WITH EXTREME [id number], ulnar nerve surgery  . Sciatica   . Shortness of breath dyspnea    with exertion  . UTI (lower urinary tract infection)     Past Surgical History:  Procedure Laterality Date  . ABDOMINAL HYSTERECTOMY    . APPENDECTOMY    .  back injection      July 2018- x1  . BREAST SURGERY Left 1986   biopsy, Recent 05/2017 at [facility]- had biopsy on R breast- malignant   . CARDIAC CATHETERIZATION  2009   "no blockage per pt"  . CHOLECYSTECTOMY    . CYSTOSCOPY WITH BIOPSY    . INCONTINENCE SURGERY    . IRRIGATION AND DEBRIDEMENT ABSCESS Right 01/28/2018   Procedure: IRRIGATION AND DEBRIDEMENT AXILLARY ABSCESS;  Surgeon: Excell Seltzer, MD;  Location: [facility];  Service: General;  Laterality: Right;  . JOINT REPLACEMENT    . KNEE ARTHROSCOPY     bilateral  . KNEE ARTHROSCOPY Left 03/17/2014    Procedure: ARTHROSCOPY LEFT KNEE WITH SYNOVECTOMY;  Surgeon: Gearlean Alf, MD;  Location: [facility];  Service: Orthopedics;  Laterality: Left;  . PORTACATH PLACEMENT Left 07/02/2017   Procedure: INSERTION PORT-A-CATH;  Surgeon: Excell Seltzer, MD;  Location: [facility];  Service: General;  Laterality: Left;  . RIGHT/LEFT HEART CATH AND CORONARY ANGIOGRAPHY N/A 08/30/2017   Procedure: RIGHT/LEFT HEART CATH AND CORONARY ANGIOGRAPHY;  Surgeon: Larey Dresser, MD;  Location: [facility];  Service: Cardiovascular;  Laterality: N/A;  . ROTATOR CUFF REPAIR     right  . SIMPLE MASTECTOMY WITH AXILLARY SENTINEL NODE BIOPSY Right 07/02/2017   Procedure: RIGHT TOTAL  MASTECTOMY WITH AXILLARY SENTINEL LYMPH  NODE BIOPSY;  Surgeon: Excell Seltzer, MD;  Location: [facility];  Service: General;  Laterality: Right;  . TOTAL KNEE ARTHROPLASTY  08/04/2012   Procedure: TOTAL KNEE ARTHROPLASTY;  Surgeon: Gearlean Alf, MD;  Location: [facility];  Service: Orthopedics;  Laterality: Left;  . TOTAL KNEE ARTHROPLASTY Right 04/11/2015   Procedure: RIGHT TOTAL KNEE ARTHROPLASTY;  Surgeon: Gaynelle Arabian, MD;  Location: [facility];  Service: Orthopedics;  Laterality: Right;  . ulnar nerve surgery Right 1986     reports that she has never smoked. She has never used smokeless tobacco. She reports that she does not drink alcohol or use drugs.  Allergies  Allergen Reactions  . Gabapentin Anxiety  . Atorvastatin Other (See Comments)    myalgias   . Duloxetine Other (See Comments)    UNSPECIFIED REACTION   . Nsaids Rash  . Oseltamivir Diarrhea and Other (See Comments)    Abdominal pain     Family History  Problem Relation Age of Onset  . Hypertension Mother   . Stroke Mother   . Breast cancer Mother 60       died at 72  . Hypertension Father   . Heart attack Father        died at 89  . Hypertension Brother        MI  . Prostate cancer Brother 72  . Hypertension Brother        MI  . Lung cancer Brother         possible lung cancer, spot found on lung being monitored  . Hypertension Brother        MI  . Non-Hodgkin's lymphoma Sister 38  . Breast cancer Other 45       is currently 90  . Breast cancer Other 55  . Cancer Cousin 62       type of cancer unk.   . Cancer Other        type unknown, eye/face   . Cancer Other 28       died from cancer in his 5's, type unknown     Prior to Admission medications   Medication  Sig Start Date End Date Taking? Authorizing Provider  acetaminophen (TYLENOL) 500 MG tablet Take 500 mg by mouth every 6 (six) hours as needed for moderate pain or headache.    Yes [provider]  anastrozole (ARIMIDEX) 1 MG tablet Take 1 tablet (1 mg total) by mouth daily. 08/27/19  Yes Magrinat, Virgie Dad, MD  aspirin EC 81 MG tablet Take 81 mg by mouth daily after breakfast.    Yes [provider]  atenolol (TENORMIN) 50 MG tablet Take 50 mg by mouth daily before breakfast.    Yes [provider]  buPROPion (WELLBUTRIN XL) 150 MG 24 hr tablet Take 150 mg by mouth daily before breakfast.   Yes [provider]  ferrous sulfate 325 (65 FE) MG tablet Take 1 tablet (325 mg total) by mouth 2 (two) times daily with a meal. Patient taking differently: Take 325 mg by mouth daily after breakfast.  06/15/19  Yes Nandigam, Venia Minks, MD  folic acid (FOLVITE) 1 MG tablet Take 1 mg by mouth daily after breakfast.    Yes [provider]  glipiZIDE (GLUCOTROL XL) 5 MG 24 hr tablet Take 5 mg by mouth daily with breakfast.  06/15/19  Yes [provider]  omeprazole (PRILOSEC) 40 MG capsule Take 40 mg by mouth every morning.   Yes [provider]  oxybutynin (DITROPAN) 5 MG tablet Take 5 mg by mouth in the morning, at noon, and at bedtime. 02/05/20  Yes [provider]  rosuvastatin (CRESTOR) 5 MG tablet Take 1 tablet (5 mg total) by mouth every other day. 07/20/19  Yes Hilty, Nadean Corwin, MD  traMADol (ULTRAM) 50 MG tablet Take 50 mg  by mouth every 6 (six) hours as needed for moderate pain or severe pain.    Yes [provider]  traZODone (DESYREL) 50 MG tablet Take 50 mg by mouth at bedtime.  04/09/19  Yes [provider]    Physical Exam: Vitals:   02/21/20 2130 02/21/20 2200 02/21/20 2300 02/22/20 0031  BP: (!) 146/63 (!) 146/60 (!) 115/44   Pulse: (!) 114 (!) 109 (!) 107   Resp: (!) 22 (!) 21    Temp:    98.9 F (37.2 C)  TempSrc:    Oral  SpO2: 94% 93% 93%   Weight:      Height:        Constitutional: NAD, calm, comfortable well-appearing morbidly obese female laying flat in bed asleep.  Awoke easily to voice. Vitals:   02/21/20 2130 02/21/20 2200 02/21/20 2300 02/22/20 0031  BP: (!) 146/63 (!) 146/60 (!) 115/44   Pulse: (!) 114 (!) 109 (!) 107   Resp: (!) 22 (!) 21    Temp:    98.9 F (37.2 C)  TempSrc:    Oral  SpO2: 94% 93% 93%   Weight:      Height:       Eyes: PERRL, lids and conjunctivae normal ENMT: Mucous membranes are moist.  Neck: normal, supple Respiratory: clear to auscultation bilaterally, no wheezing, no crackles. Normal respiratory effort. No accessory muscle use.  Cardiovascular: Regular rate and rhythm, no murmurs / rubs / gallops.  {Bilateral lower extremity difficult to assess any true edema.   Abdomen: no tenderness, no masses palpated.  Bowel sounds positive.  GU: Catheter in place Musculoskeletal: no clubbing / cyanosis. No joint deformity upper and lower extremities. Good ROM, no contractures. Normal muscle tone.  Skin: no rashes, lesions, ulcers. No induration Neurologic: CN 2-12 grossly  intact. Sensation intact. Strength 5/5 in all 4.  Psychiatric: Normal judgment and insight. Alert and oriented x 3. Normal mood.    Labs on Admission: I have personally reviewed following labs and imaging studies  CBC: Recent Labs  Lab 02/21/20 1755  WBC 20.6*  NEUTROABS 19.1*  HGB 11.2*  HCT 35.2*  MCV 85.0  PLT 0000000   Basic Metabolic Panel: Recent Labs  Lab  02/21/20 1755  NA 133*  K 4.4  CL 101  CO2 22  GLUCOSE 142*  BUN 21  CREATININE 1.02*  CALCIUM 8.8*   GFR: Estimated Creatinine Clearance: 55.1 mL/min (A) (by C-G formula based on SCr of 1.02 mg/dL (H)). Liver Function Tests: Recent Labs  Lab 02/21/20 1755  AST 22  ALT 16  ALKPHOS 107  BILITOT 0.8  PROT 7.1  ALBUMIN 3.6   Recent Labs  Lab 02/21/20 1755  LIPASE 29   No results for input(s): AMMONIA in the last 168 hours. Coagulation Profile: Recent Labs  Lab 02/21/20 1755  INR 1.1   Cardiac Enzymes: No results for input(s): CKTOTAL, CKMB, CKMBINDEX, TROPONINI in the last 168 hours. BNP (last 3 results) No results for input(s): PROBNP in the last 8760 hours. HbA1C: No results for input(s): HGBA1C in the last 72 hours. CBG: No results for input(s): GLUCAP in the last 168 hours. Lipid Profile: No results for input(s): CHOL, HDL, LDLCALC, TRIG, CHOLHDL, LDLDIRECT in the last 72 hours. Thyroid Function Tests: No results for input(s): TSH, T4TOTAL, FREET4, T3FREE, THYROIDAB in the last 72 hours. Anemia Panel: No results for input(s): VITAMINB12, FOLATE, FERRITIN, TIBC, IRON, RETICCTPCT in the last 72 hours. Urine analysis:    Component Value Date/Time   COLORURINE AMBER (A) 02/21/2020 1755   APPEARANCEUR CLEAR 02/21/2020 1755   LABSPEC 1.014 02/21/2020 1755   PHURINE 5.0 02/21/2020 1755   GLUCOSEU NEGATIVE 02/21/2020 1755   HGBUR MODERATE (A) 02/21/2020 1755   BILIRUBINUR NEGATIVE 02/21/2020 1755   KETONESUR NEGATIVE 02/21/2020 1755   PROTEINUR NEGATIVE 02/21/2020 1755   UROBILINOGEN 0.2 04/06/2015 1332   NITRITE POSITIVE (A) 02/21/2020 1755   LEUKOCYTESUR MODERATE (A) 02/21/2020 1755    Radiological Exams on Admission: DG Chest Port 1 View  Result Date: 02/21/2020 CLINICAL DATA:  Fever. EXAM: PORTABLE CHEST 1 VIEW COMPARISON:  07/02/2017 FINDINGS: The heart size is stable from prior study. Aortic calcifications are noted. There is no pneumothorax or  large pleural effusion. There is some atelectasis versus scarring at the lung bases, left worse than right. There is no acute osseous abnormality. IMPRESSION: No active disease. Electronically Signed   By: Constance Holster M.D.   On: 02/21/2020 18:14    EKG: Independently reviewed.   Assessment/Plan  sepsis secondary to UTI She received vancomycin, cefepime and Flagyl in the ED.  We will continue cefepime and follow urine cultures.  Suspect most of her GI symptoms are due to urosepsis. GI pathogen panel pending.  C. difficile pending. Continue IV fluids  Chronic diastolic heart failure Grade 1 diastolic heart failure seen in echo in 2019.  Monitor for volume overload with continuous fluid  History of right breast cancer s/p mastectomy Continue anastrozole  Type 2 diabetes Low dose SSI  Hypertension continue atenolol  CAD continue aspirin and statin  Depression continue wellbutrin, trazodone  Morbid obesity BMI greater than 44  DVT prophylaxis:.Lovenox Code Status: Full Family Communication: Plan discussed with patient at bedside  disposition Plan: Home with at least 2 midnight stays  Consults called:  Admission status:  inpatient   Orene Desanctis DO [facility]   If 7PM-7AM, please contact night-coverage [email or web address]   02/22/2020, 1:08 AM

## 2020-02-22 NOTE — Progress Notes (Signed)
Pharmacy Antibiotic Note  Kiara Brown is a 79 y.o. female admitted on 02/21/2020 with UTI.  Pharmacy has been consulted for cefepime dosing.  Plan: Cefepime 2g IV q12 based on current renal function  Height: 5\' 3"  (160 cm) Weight: 250 lb (113.4 kg) IBW/kg (Calculated) : 52.4  Temp (24hrs), Avg:99.6 F (37.6 C), Min:98.7 F (37.1 C), Max:100.5 F (38.1 C)  Recent Labs  Lab 02/21/20 1755 02/21/20 1955  WBC 20.6*  --   CREATININE 1.02*  --   LATICACIDVEN 2.3* 2.6*    Estimated Creatinine Clearance: 55.1 mL/min (A) (by C-G formula based on SCr of 1.02 mg/dL (H)).    Allergies  Allergen Reactions  . Gabapentin Anxiety  . Atorvastatin Other (See Comments)    myalgias   . Duloxetine Other (See Comments)    UNSPECIFIED REACTION   . Nsaids Rash  . Oseltamivir Diarrhea and Other (See Comments)    Abdominal pain      Thank you for allowing pharmacy to be a part of this patient's care.  Kara Mead 02/22/2020 1:40 AM

## 2020-02-22 NOTE — Progress Notes (Signed)
Kiara Brown is a 79 y.o. female with medical history significant for chronic diastolic heart failure, right breast cancer s/p mastectomy on anastrozole, type 2 diabetes, rheumatoid arthritis, CAD and hypertension who presents with concerns of syncope and nausea, vomiting and diarrhea.   Patient reports that she has been dealing with UTI on and off for the past 6 to 8 weeks.  She has gone through 2 different antibiotics in the past 2 months.    ED Course: She was febrile up to 100.5, tachycardic and tachypneic on room air.  WBC of 20.6.  Lactic acid of 2.3 and then 2.6.  Sodium of 133, glucose of 140, creatinine slightly elevated at 1.02.  This shows positive nitrite and moderate leukocyte with rare bacteria and many WBCs. Chest x-ray negative.  She was started on vancomycin, cefepime and Flagyl in the ED.  She was also given 2.5 L of IV LR.  02/22/20:  Seen and examined.  Reports dysuria.  No BM this AM.  On IV abx empirically for UTI.  Cxs in process.  Follow Cxs.  Please refer to H&P dictated by my partner Dr. Flossie Buffy on 02/22/20 for further details of the assessment and plan.

## 2020-02-22 NOTE — ED Notes (Addendum)
ED TO INPATIENT HANDOFF REPORT  ED Nurse Name and Phone #: Fredonia Highland [phone]  S Name/Age/Gender Kiara Brown 79 y.o. female Room/Bed: WA23/WA23  Code Status   Code Status: Full Code  Home/SNF/Other Home Patient oriented to: self, place, time and situation Is this baseline? Yes   Triage Complete: Triage complete  Chief Complaint Sepsis [facility]) [A41.9]  Triage Note Per EMS, Pt having N/V/D apprx 9 hrs ago, along with weakness, and burning with urination. Pt also had a syncopal episode this morning and fell to the floor. Pt layed there for apprx 5 hrs before family found pt and called 911. Increased respirations but denies SOB. Hx of UTI's, and currently being treated for it.     Allergies Allergies  Allergen Reactions  . Gabapentin Anxiety  . Atorvastatin Other (See Comments)    myalgias   . Duloxetine Other (See Comments)    UNSPECIFIED REACTION   . Nsaids Rash  . Oseltamivir Diarrhea and Other (See Comments)    Abdominal pain     Level of Care/Admitting Diagnosis ED Disposition    ED Disposition Condition Comment   Admit  Hospital Area: [place] [[id number]]  Level of Care: Med-Surg [16]  May admit patient to [facility] or [name] if equivalent level of care is available:: No  Covid Evaluation: Asymptomatic Screening Protocol (No Symptoms)  [facility]: [id number]  Admitting Physician: Orene Desanctis [id number]  Attending Physician: Orene Desanctis [id number]  Estimated length of stay: past midnight tomorrow  Certification:: I certify this patient will need inpatient services for at least 2 midnights       B Medical/Surgery History Past Medical History:  Diagnosis Date  . Arthritis    osteo, rheumatoid  . Breast cancer ([place])   . Diverticulosis   . Dysrhythmia    hx rapid heartbeat, no cardiologist  . Family history of breast cancer   . Family history of prostate cancer   . Fibromyalgia    peripheral neuropathy  .  Fibromyalgia   . GERD (gastroesophageal reflux disease)   . H/O hiatal hernia   . Hyperlipidemia   . PONV (postoperative nausea and vomiting)    "THROAT WITH EXTREME [id number], ulnar nerve surgery  . Sciatica   . Shortness of breath dyspnea    with exertion  . UTI (lower urinary tract infection)    Past Surgical History:  Procedure Laterality Date  . ABDOMINAL HYSTERECTOMY    . APPENDECTOMY    . back injection      July 2018- x1  . BREAST SURGERY Left 1986   biopsy, Recent 05/2017 at [facility]- had biopsy on R breast- malignant   . CARDIAC CATHETERIZATION  2009   "no blockage per pt"  . CHOLECYSTECTOMY    . CYSTOSCOPY WITH BIOPSY    . INCONTINENCE SURGERY    . IRRIGATION AND DEBRIDEMENT ABSCESS Right 01/28/2018   Procedure: IRRIGATION AND DEBRIDEMENT AXILLARY ABSCESS;  Surgeon: Excell Seltzer, MD;  Location: [facility];  Service: General;  Laterality: Right;  . JOINT REPLACEMENT    . KNEE ARTHROSCOPY     bilateral  . KNEE ARTHROSCOPY Left 03/17/2014   Procedure: ARTHROSCOPY LEFT KNEE WITH SYNOVECTOMY;  Surgeon: Gearlean Alf, MD;  Location: [facility];  Service: Orthopedics;  Laterality: Left;  . PORTACATH PLACEMENT Left 07/02/2017   Procedure: INSERTION PORT-A-CATH;  Surgeon: Excell Seltzer, MD;  Location: [place];  Service: General;  Laterality: Left;  . RIGHT/LEFT HEART CATH AND CORONARY ANGIOGRAPHY N/A  08/30/2017   Procedure: RIGHT/LEFT HEART CATH AND CORONARY ANGIOGRAPHY;  Surgeon: Larey Dresser, MD;  Location: [place] [facility];  Service: Cardiovascular;  Laterality: N/A;  . ROTATOR CUFF REPAIR     right  . SIMPLE MASTECTOMY WITH AXILLARY SENTINEL NODE BIOPSY Right 07/02/2017   Procedure: RIGHT TOTAL  MASTECTOMY WITH AXILLARY SENTINEL LYMPH  NODE BIOPSY;  Surgeon: Excell Seltzer, MD;  Location: [place];  Service: General;  Laterality: Right;  . TOTAL KNEE ARTHROPLASTY  08/04/2012   Procedure: TOTAL KNEE ARTHROPLASTY;  Surgeon: Gearlean Alf, MD;  Location[facility];  Service:  Orthopedics;  Laterality: Left;  . TOTAL KNEE ARTHROPLASTY Right 04/11/2015   Procedure: RIGHT TOTAL KNEE ARTHROPLASTY;  Surgeon: Gaynelle Arabian, MD;  Location[facility];  Service: Orthopedics;  Laterality: Right;  . ulnar nerve surgery Right 1986     A IV Location/Drains/Wounds Patient Lines/Drains/Airways Status   Active Line/Drains/Airways    Name:   Placement date:   Placement time:   Site:   Days:   Implanted Port 07/02/17 Left Chest   07/02/17    1452    Chest   965   Peripheral IV 01/30/18 Left;Posterior Forearm   01/30/18    1137    Forearm   753   Peripheral IV 02/21/20 Left Antecubital   02/21/20    1837    Antecubital   1   Closed System Drain 1 Right;Lateral Breast Bulb (JP) 19 Fr.   07/02/17    1416    Breast   965   Incision (Closed) 07/02/17 Breast Right   07/02/17    1400     965   Incision (Closed) 07/02/17 Chest Left   07/02/17    1512     965   Incision (Closed) 01/28/18 Breast Right   01/28/18    1414     755          Intake/Output Last 24 hours  Intake/Output Summary (Last 24 hours) at 02/22/2020 0109 Last data filed at 02/21/2020 2344 Gross per 24 hour  Intake 3000 ml  Output --  Net 3000 ml    Labs/Imaging Results for orders placed or performed during the hospital encounter of 02/21/20 (from the past 48 hour(s))  Lactic acid, plasma     Status: Abnormal   Collection Time: 02/21/20  5:55 PM  Result Value Ref Range   Lactic Acid, Venous 2.3 (HH) 0.5 - 1.9 mmol/L    Comment: CRITICAL RESULT CALLED TO, READ BACK BY AND VERIFIED WITH: A.Worthy Flank [id number] @1930  BY V.WILKINS Performed at [facility] [address] [place], [place] [address]   Comprehensive metabolic panel     Status: Abnormal   Collection Time: 02/21/20  5:55 PM  Result Value Ref Range   Sodium 133 (L) 135 - 145 mmol/L   Potassium 4.4 3.5 - 5.1 mmol/L   Chloride 101 98 - 111 mmol/L   CO2 22 22 - 32 mmol/L   Glucose, Bld 142 (H) 70 - 99 mg/dL    Comment: Glucose  reference range applies only to samples taken after fasting for at least 8 hours.   BUN 21 8 - 23 mg/dL   Creatinine, Ser 1.02 (H) 0.44 - 1.00 mg/dL   Calcium 8.8 (L) 8.9 - 10.3 mg/dL   Total Protein 7.1 6.5 - 8.1 g/dL   Albumin 3.6 3.5 - 5.0 g/dL   AST 22 15 - 41 U/L   ALT 16 0 - 44 U/L  Alkaline Phosphatase 107 38 - 126 U/L   Total Bilirubin 0.8 0.3 - 1.2 mg/dL   GFR calc non Af Amer 53 (L) >60 mL/min   GFR calc Af Amer >60 >60 mL/min   Anion gap 10 5 - 15    Comment: Performed at [facility], [place] [address] [place], [place] [address]  CBC WITH DIFFERENTIAL     Status: Abnormal   Collection Time: 02/21/20  5:55 PM  Result Value Ref Range   WBC 20.6 (H) 4.0 - 10.5 K/uL   RBC 4.14 3.87 - 5.11 MIL/uL   Hemoglobin 11.2 (L) 12.0 - 15.0 g/dL   HCT 35.2 (L) 36.0 - 46.0 %   MCV 85.0 80.0 - 100.0 fL   MCH 27.1 26.0 - 34.0 pg   MCHC 31.8 30.0 - 36.0 g/dL   RDW 14.7 11.5 - 15.5 %   Platelets 353 150 - 400 K/uL   nRBC 0.0 0.0 - 0.2 %   Neutrophils Relative % 92 %   Neutro Abs 19.1 (H) 1.7 - 7.7 K/uL   Lymphocytes Relative 3 %   Lymphs Abs 0.5 (L) 0.7 - 4.0 K/uL   Monocytes Relative 4 %   Monocytes Absolute 0.8 0.1 - 1.0 K/uL   Eosinophils Relative 0 %   Eosinophils Absolute 0.0 0.0 - 0.5 K/uL   Basophils Relative 0 %   Basophils Absolute 0.0 0.0 - 0.1 K/uL   Immature Granulocytes 1 %   Abs Immature Granulocytes 0.12 (H) 0.00 - 0.07 K/uL    Comment: Performed at [facility], [place] [address], [place], [place] [address]  APTT     Status: None   Collection Time: 02/21/20  5:55 PM  Result Value Ref Range   aPTT 30 24 - 36 seconds    Comment: Performed at [facility], [place] [address] [place], [place] [address]  Protime-INR     Status: None   Collection Time: 02/21/20  5:55 PM  Result Value Ref Range   Prothrombin Time 13.7 11.4 - 15.2 seconds   INR 1.1 0.8 - 1.2    Comment: (NOTE) INR goal varies based on device and  disease states. Performed at [facility], [place] [address], [place], [place] [address]   Urinalysis, Routine w reflex microscopic     Status: Abnormal   Collection Time: 02/21/20  5:55 PM  Result Value Ref Range   Color, Urine AMBER (A) YELLOW    Comment: BIOCHEMICALS MAY BE AFFECTED BY COLOR   APPearance CLEAR CLEAR   Specific Gravity, Urine 1.014 1.005 - 1.030   pH 5.0 5.0 - 8.0   Glucose, UA NEGATIVE NEGATIVE mg/dL   Hgb urine dipstick MODERATE (A) NEGATIVE   Bilirubin Urine NEGATIVE NEGATIVE   Ketones, ur NEGATIVE NEGATIVE mg/dL   Protein, ur NEGATIVE NEGATIVE mg/dL   Nitrite POSITIVE (A) NEGATIVE   Leukocytes,Ua MODERATE (A) NEGATIVE   RBC / HPF 6-10 0 - 5 RBC/hpf   WBC, UA 21-50 0 - 5 WBC/hpf   Bacteria, UA RARE (A) NONE SEEN   Squamous Epithelial / LPF 0-5 0 - 5   Mucus PRESENT     Comment: Performed at [facility], [place] [address] [place], [place] [address]  Lipase, blood     Status: None   Collection Time: 02/21/20  5:55 PM  Result Value Ref Range   Lipase 29 11 - 51 U/L    Comment: Performed at [facility],  [place] [address] [place], [place] [address]  Respiratory Panel by RT PCR (Flu A&B, Covid) - Nasopharyngeal Swab     Status: None   Collection Time: 02/21/20  5:58 PM   Specimen: Nasopharyngeal Swab  Result Value Ref Range   SARS Coronavirus 2 by RT PCR NEGATIVE NEGATIVE    Comment: (NOTE) SARS-CoV-2 target nucleic acids are NOT DETECTED. The SARS-CoV-2 RNA is generally detectable in upper respiratoy specimens during the acute phase of infection. The lowest concentration of SARS-CoV-2 viral copies this assay can detect is 131 copies/mL. A negative result does not preclude SARS-Cov-2 infection and should not be used as the sole basis for treatment or other patient management decisions. A negative result may occur with  improper specimen collection/handling, submission of specimen other than nasopharyngeal  swab, presence of viral mutation(s) within the areas targeted by this assay, and inadequate number of viral copies (<131 copies/mL). A negative result must be combined with clinical observations, patient history, and epidemiological information. The expected result is Negative. Fact Sheet for Patients:  PinkCheek.be Fact Sheet for Healthcare Providers:  GravelBags.it This test is not yet ap proved or cleared by the [place] FDA and  has been authorized for detection and/or diagnosis of SARS-CoV-2 by FDA under an Emergency Use Authorization (EUA). This EUA will remain  in effect (meaning this test can be used) for the duration of the COVID-19 declaration under Section 564(b)(1) of the Act, 21 U.S.C. section 360bbb-3(b)(1), unless the authorization is terminated or revoked sooner.    Influenza A by PCR NEGATIVE NEGATIVE   Influenza B by PCR NEGATIVE NEGATIVE    Comment: (NOTE) The Xpert Xpress SARS-CoV-2/FLU/RSV assay is intended as an aid in  the diagnosis of influenza from Nasopharyngeal swab specimens and  should not be used as a sole basis for treatment. Nasal washings and  aspirates are unacceptable for Xpert Xpress SARS-CoV-2/FLU/RSV  testing. Fact Sheet for Patients: PinkCheek.be Fact Sheet for Healthcare Providers: GravelBags.it This test is not yet approved or cleared by the [place] FDA and  has been authorized for detection and/or diagnosis of SARS-CoV-2 by  FDA under an Emergency Use Authorization (EUA). This EUA will remain  in effect (meaning this test can be used) for the duration of the  Covid-19 declaration under Section 564(b)(1) of the Act, 21  U.S.C. section 360bbb-3(b)(1), unless the authorization is  terminated or revoked. Performed at [facility], [place] [address] [place], [place] [address]   Lactic acid, plasma      Status: Abnormal   Collection Time: 02/21/20  7:55 PM  Result Value Ref Range   Lactic Acid, Venous 2.6 (HH) 0.5 - 1.9 mmol/L    Comment: CRITICAL VALUE NOTED.  VALUE IS CONSISTENT WITH PREVIOUSLY REPORTED AND CALLED VALUE. Performed at [facility], [place] [address] [place], [place] [address]    DG Chest Port 1 View  Result Date: 02/21/2020 CLINICAL DATA:  Fever. EXAM: PORTABLE CHEST 1 VIEW COMPARISON:  07/02/2017 FINDINGS: The heart size is stable from prior study. Aortic calcifications are noted. There is no pneumothorax or large pleural effusion. There is some atelectasis versus scarring at the lung bases, left worse than right. There is no acute osseous abnormality. IMPRESSION: No active disease. Electronically Signed   By: Constance Holster M.D.   On: 02/21/2020 18:14    Pending Labs Unresulted Labs (From admission, onward)    Start     Ordered   02/22/20 [id number]  Basic metabolic panel  Tomorrow morning,   R     02/22/20 0020   02/22/20 0500  CBC  Tomorrow morning,   R     02/22/20 0020   02/22/20 0023  C Difficile Quick Screen w PCR reflex  (C Difficile quick screen w PCR reflex panel)  Once, for 24 hours,   STAT     02/22/20 0022   02/21/20 1937  GI pathogen panel by PCR, stool  (Gastrointestinal Panel by PCR, Stool                                                                                                                                                     *Does Not include CLOSTRIDIUM DIFFICILE testing.**If CDIFF testing is needed, select the C Difficile Quick Screen w PCR reflex order below)  Once,   STAT     02/21/20 1936   02/21/20 1755  Blood Culture (routine x 2)  BLOOD CULTURE X 2,   STAT     02/21/20 1755   02/21/20 1755  Urine culture  ONCE - STAT,   STAT     02/21/20 1755          Vitals/Pain Today's Vitals   02/21/20 2130 02/21/20 2200 02/21/20 2300 02/22/20 0031  BP: (!) 146/63 (!) 146/60 (!) 115/44   Pulse: (!) 114 (!) 109 (!) 107    Resp: (!) 22 (!) 21    Temp:    98.9 F (37.2 C)  TempSrc:    Oral  SpO2: 94% 93% 93%   Weight:      Height:      PainSc:        Isolation Precautions Enteric precautions (UV disinfection)  Medications Medications  metroNIDAZOLE (FLAGYL) IVPB 500 mg (500 mg Intravenous New Bag/Given 02/22/20 0031)  enoxaparin (LOVENOX) injection 40 mg (has no administration in time range)  lactated ringers bolus 1,000 mL (0 mLs Intravenous Stopped 02/21/20 2027)  lactated ringers bolus 1,500 mL (0 mLs Intravenous Stopped 02/21/20 2344)  ceFEPIme (MAXIPIME) 2 g in sodium chloride 0.9 % 100 mL IVPB (0 g Intravenous Stopped 02/21/20 2116)  vancomycin (VANCOREADY) IVPB 2000 mg/400 mL (0 mg Intravenous Stopped 02/21/20 2344)  acetaminophen (TYLENOL) tablet 1,000 mg (1,000 mg Oral Given 02/21/20 2113)    Mobility walks with person assist Low fall risk   Focused Assessments Gen Med    O2 Device: Room Air        R Recommendations: See Admitting Provider Note  Report given to: Larene Beach RN  Additional Notes:

## 2020-02-23 LAB — GLUCOSE, CAPILLARY
Glucose-Capillary: 101 mg/dL — ABNORMAL HIGH (ref 70–99)
Glucose-Capillary: 158 mg/dL — ABNORMAL HIGH (ref 70–99)
Glucose-Capillary: 141 mg/dL — ABNORMAL HIGH (ref 70–99)
Glucose-Capillary: 137 mg/dL — ABNORMAL HIGH (ref 70–99)

## 2020-02-23 LAB — CBC WITH DIFFERENTIAL/PLATELET
Abs Immature Granulocytes: 0.04 10*3/uL (ref 0.00–0.07)
Basophils Absolute: 0 10*3/uL (ref 0.0–0.1)
Basophils Relative: 0 %
Eosinophils Absolute: 0.3 10*3/uL (ref 0.0–0.5)
Eosinophils Relative: 3 %
HCT: 31 % — ABNORMAL LOW (ref 36.0–46.0)
Hemoglobin: 9.4 g/dL — ABNORMAL LOW (ref 12.0–15.0)
Immature Granulocytes: 0 %
Lymphocytes Relative: 19 %
Lymphs Abs: 1.7 10*3/uL (ref 0.7–4.0)
MCH: 26.4 pg (ref 26.0–34.0)
MCHC: 30.3 g/dL (ref 30.0–36.0)
MCV: 87.1 fL (ref 80.0–100.0)
Monocytes Absolute: 1 10*3/uL (ref 0.1–1.0)
Monocytes Relative: 10 %
Neutro Abs: 6.2 10*3/uL (ref 1.7–7.7)
Neutrophils Relative %: 68 %
Platelets: 277 10*3/uL (ref 150–400)
RBC: 3.56 MIL/uL — ABNORMAL LOW (ref 3.87–5.11)
RDW: 15 % (ref 11.5–15.5)
WBC: 9.3 10*3/uL (ref 4.0–10.5)
nRBC: 0 % (ref 0.0–0.2)

## 2020-02-23 LAB — BASIC METABOLIC PANEL
Anion gap: 8 (ref 5–15)
BUN: 8 mg/dL (ref 8–23)
CO2: 22 mmol/L (ref 22–32)
Calcium: 8.4 mg/dL — ABNORMAL LOW (ref 8.9–10.3)
Chloride: 107 mmol/L (ref 98–111)
Creatinine, Ser: 0.87 mg/dL (ref 0.44–1.00)
GFR calc Af Amer: >60 mL/min (ref >60)
GFR calc non Af Amer: >60 mL/min (ref >60)
Glucose, Bld: 105 mg/dL — ABNORMAL HIGH (ref 70–99)
Potassium: 3.6 mmol/L (ref 3.5–5.1)
Sodium: 137 mmol/L (ref 135–145)

## 2020-02-23 NOTE — Progress Notes (Signed)
PROGRESS NOTE  HEAVENLY POPPY [id number] DOB: 1941-11-21 DOA: 02/21/2020 PCP: Burman Freestone, MD  HPI/Recap of past 24 hours: Greer Ee a 79 y.o.femalewith medical history significant forchronic diastolic heart failure, right breast cancer s/p mastectomy on anastrozole, type 2 diabetes, rheumatoid arthritis, recurrent UTI followed by urology, CAD and hypertension who presents with concerns of syncope, nausea, vomiting and diarrhea.    ED Course:Febrile up to 100.5, WBC of 20.6. Lactic acid of 2.3 and then 2.6. UA shows positive nitrite and moderate leukocyte with rare bacteria and many WBCs.  On IV abx empirically for UTI.    Urine culture suggest recollection.  Blood cultures negative to date.  02/23/20: Still reports dysuria with urination.  States she had an allergic reaction to Pyridium.  On IV antibiotics for presumed UTI.   Assessment/Plan: Principal Problem:   Sepsis secondary to UTI [facility]) Active Problems:   Stage I breast cancer, right ([place])   Essential hypertension   Type II diabetes mellitus ([place])   Severe obesity ([facility])   CAD (coronary artery disease)   Depression   Sepsis secondary to presumed UTI Presented with leukocytosis and fever with T-max 100.5. Sepsis physiology has resolved She received vancomycin, cefepime and Flagyl in the ED.    IV vancomycin and Flagyl discontinued.    Currently on cefepime. Urine culture suggestive of recollection. Blood cultures negative to date.  Continue to follow cultures.  Persistent dysuria, states allergic to Pyridium. Follows with urology outpatient  Chronic diastolic heart failure Grade 1 diastolic heart failure seen in echo in 2019 Continue strict I's and O's and daily weights Closely monitor volume status on IV fluid  History of right breast cancer s/p mastectomy Continue anastrozole  Type 2 diabetes Hemoglobin A1c 7.0 Continue low dose SSI  Hypertension continue  atenolol  CAD continue aspirin and statin  Depression continue wellbutrin, trazodone  Morbid obesity BMI greater than 44 Recommend weight loss outpatient  Fall, questionable syncope in the setting of vomiting and diarrhea Obtain orthosthatic VS Fall precautions  Resolved diarrhea  DVT prophylaxis:.Lovenox subcu daily Code Status: Full Family Communication: We will call family if okay with the patient   Disposition Plan: Presents from home.  Anticipate discharge to home in the next 24 to 48 hours once symptomatology has improved.   Consults called: None.   Objective: Vitals:   02/22/20 2255 02/22/20 2258 02/23/20 0642 02/23/20 1335  BP:  (!) 141/54 136/60 (!) 135/53  Pulse:  73 73 63  Resp:  16 17 16   Temp: 98.7 F (37.1 C)  98.6 F (37 C) 98.7 F (37.1 C)  TempSrc: Oral  Oral Oral  SpO2:  96% 96% 97%  Weight:      Height:        Intake/Output Summary (Last 24 hours) at 02/23/2020 1617 Last data filed at 02/23/2020 0645 Gross per 24 hour  Intake --  Output 1800 ml  Net -1800 ml   Filed Weights   02/21/20 1753  Weight: 113.4 kg    Exam:  . General: 79 y.o. year-old female well developed well nourished in no acute distress.  Alert and oriented x3. . Cardiovascular: Regular rate and rhythm with no rubs or gallops.  No thyromegaly or JVD noted.   Marland Kitchen Respiratory: Clear to auscultation with no wheezes or rales. Good inspiratory effort. . Abdomen: Soft nontender nondistended with normal bowel sounds x4 quadrants. . Musculoskeletal: No lower extremity edema. 2/4 pulses in all 4 extremities. Marland Kitchen Psychiatry: Mood is appropriate  for condition and setting   Data Reviewed: CBC: Recent Labs  Lab 02/21/20 1755 02/22/20 0526 02/23/20 0623  WBC 20.6* 14.2* 9.3  NEUTROABS 19.1*  --  6.2  HGB 11.2* 9.3* 9.4*  HCT 35.2* 30.2* 31.0*  MCV 85.0 85.1 87.1  PLT 353 284 [id number]   Basic Metabolic Panel: Recent Labs  Lab 02/21/20 1755 02/22/20 0526 02/23/20 0623   NA 133* 133* 137  K 4.4 3.9 3.6  CL 101 104 107  CO2 22 21* 22  GLUCOSE 142* 116* 105*  BUN 21 15 8   CREATININE 1.02* 0.96 0.87  CALCIUM 8.8* 8.3* 8.4*   GFR: Estimated Creatinine Clearance: 64.6 mL/min (by C-G formula based on SCr of 0.87 mg/dL). Liver Function Tests: Recent Labs  Lab 02/21/20 1755  AST 22  ALT 16  ALKPHOS 107  BILITOT 0.8  PROT 7.1  ALBUMIN 3.6   Recent Labs  Lab 02/21/20 1755  LIPASE 29   No results for input(s): AMMONIA in the last 168 hours. Coagulation Profile: Recent Labs  Lab 02/21/20 1755  INR 1.1   Cardiac Enzymes: No results for input(s): CKTOTAL, CKMB, CKMBINDEX, TROPONINI in the last 168 hours. BNP (last 3 results) No results for input(s): PROBNP in the last 8760 hours. HbA1C: Recent Labs    02/22/20 0526  HGBA1C 7.0*   CBG: Recent Labs  Lab 02/22/20 1141 02/22/20 1655 02/22/20 2259 02/23/20 0731 02/23/20 1250  GLUCAP 147* 127* 152* 101* 158*   Lipid Profile: No results for input(s): CHOL, HDL, LDLCALC, TRIG, CHOLHDL, LDLDIRECT in the last 72 hours. Thyroid Function Tests: No results for input(s): TSH, T4TOTAL, FREET4, T3FREE, THYROIDAB in the last 72 hours. Anemia Panel: No results for input(s): VITAMINB12, FOLATE, FERRITIN, TIBC, IRON, RETICCTPCT in the last 72 hours. Urine analysis:    Component Value Date/Time   COLORURINE AMBER (A) 02/21/2020 1755   APPEARANCEUR CLEAR 02/21/2020 1755   LABSPEC 1.014 02/21/2020 1755   PHURINE 5.0 02/21/2020 1755   GLUCOSEU NEGATIVE 02/21/2020 1755   HGBUR MODERATE (A) 02/21/2020 1755   BILIRUBINUR NEGATIVE 02/21/2020 1755   KETONESUR NEGATIVE 02/21/2020 1755   PROTEINUR NEGATIVE 02/21/2020 1755   UROBILINOGEN 0.2 04/06/2015 1332   NITRITE POSITIVE (A) 02/21/2020 1755   LEUKOCYTESUR MODERATE (A) 02/21/2020 1755   Sepsis Labs: @LABRCNTIP (procalcitonin:4,lacticidven:4)  ) Recent Results (from the past 240 hour(s))  Blood Culture (routine x 2)     Status: None  (Preliminary result)   Collection Time: 02/21/20  5:55 PM   Specimen: BLOOD  Result Value Ref Range Status   Specimen Description   Final    BLOOD LEFT ANTECUBITAL Performed at [facility], [place] [address], [place], [place] [address]    Special Requests   Final    BOTTLES DRAWN AEROBIC AND ANAEROBIC Blood Culture adequate volume Performed at [facility] [address] [place], [place] [address]    Culture   Final    NO GROWTH 2 DAYS Performed at [facility] [address], [place], [place] [address]    Report Status PENDING  Incomplete  Urine culture     Status: Abnormal   Collection Time: 02/21/20  5:55 PM   Specimen: In/Out Cath Urine  Result Value Ref Range Status   Specimen Description   Final    IN/OUT CATH URINE Performed at [facility] [address], [place], [place] [address]    Special Requests   Final    NONE Performed at [facility], [place] [address], [place], [place] [address]    Culture MULTIPLE SPECIES PRESENT, SUGGEST RECOLLECTION (A)  Final   Report Status 02/22/2020 FINAL  Final  Respiratory Panel by RT PCR (Flu A&B, Covid) - Nasopharyngeal Swab     Status: None   Collection Time: 02/21/20  5:58 PM   Specimen: Nasopharyngeal Swab  Result Value Ref Range Status   SARS Coronavirus 2 by RT PCR NEGATIVE NEGATIVE Final    Comment: (NOTE) SARS-CoV-2 target nucleic acids are NOT DETECTED. The SARS-CoV-2 RNA is generally detectable in upper respiratoy specimens during the acute phase of infection. The lowest concentration of SARS-CoV-2 viral copies this assay can detect is 131 copies/mL. A negative result does not preclude SARS-Cov-2 infection and should not be used as the sole basis for treatment or other patient management decisions. A negative result may occur with  improper specimen collection/handling, submission of specimen other than nasopharyngeal swab, presence of  viral mutation(s) within the areas targeted by this assay, and inadequate number of viral copies (<131 copies/mL). A negative result must be combined with clinical observations, patient history, and epidemiological information. The expected result is Negative. Fact Sheet for Patients:  PinkCheek.be Fact Sheet for Healthcare Providers:  GravelBags.it This test is not yet ap proved or cleared by the [place] FDA and  has been authorized for detection and/or diagnosis of SARS-CoV-2 by FDA under an Emergency Use Authorization (EUA). This EUA will remain  in effect (meaning this test can be used) for the duration of the COVID-19 declaration under Section 564(b)(1) of the Act, 21 U.S.C. section 360bbb-3(b)(1), unless the authorization is terminated or revoked sooner.    Influenza A by PCR NEGATIVE NEGATIVE Final   Influenza B by PCR NEGATIVE NEGATIVE Final    Comment: (NOTE) The Xpert Xpress SARS-CoV-2/FLU/RSV assay is intended as an aid in  the diagnosis of influenza from Nasopharyngeal swab specimens and  should not be used as a sole basis for treatment. Nasal washings and  aspirates are unacceptable for Xpert Xpress SARS-CoV-2/FLU/RSV  testing. Fact Sheet for Patients: PinkCheek.be Fact Sheet for Healthcare Providers: GravelBags.it This test is not yet approved or cleared by the [place] FDA and  has been authorized for detection and/or diagnosis of SARS-CoV-2 by  FDA under an Emergency Use Authorization (EUA). This EUA will remain  in effect (meaning this test can be used) for the duration of the  Covid-19 declaration under Section 564(b)(1) of the Act, 21  U.S.C. section 360bbb-3(b)(1), unless the authorization is  terminated or revoked. Performed at [facility], [place] [address], [place], [place] [address]   Blood Culture (routine x 2)      Status: None (Preliminary result)   Collection Time: 02/22/20  5:26 AM   Specimen: BLOOD LEFT HAND  Result Value Ref Range Status   Specimen Description   Final    BLOOD LEFT HAND Performed at [facility] [address], [place], [place] [address]    Special Requests   Final    BOTTLES DRAWN AEROBIC ONLY Blood Culture adequate volume Performed at [facility] [address], [place], [place] [address]    Culture   Final    NO GROWTH 1 DAY Performed at [facility], [place] [address], [place], [place] [address]    Report Status PENDING  Incomplete      Studies: No results found.  Scheduled Meds: . anastrozole  1 mg Oral Daily  . aspirin  EC  81 mg Oral QPC breakfast  . atenolol  50 mg Oral QAC breakfast  . buPROPion  150 mg Oral QAC breakfast  . enoxaparin (LOVENOX) injection  40 mg Subcutaneous Q24H  . ferrous sulfate  325 mg Oral QPC breakfast  . folic acid  1 mg Oral QPC breakfast  . insulin aspart  0-9 Units Subcutaneous TID WC  . pantoprazole  40 mg Oral Daily  . rosuvastatin  5 mg Oral QODAY  . traZODone  50 mg Oral QHS    Continuous Infusions: . sodium chloride 75 mL/hr at 02/22/20 0551  . ceFEPime (MAXIPIME) IV 2 g (02/23/20 1130)     LOS: 1 day     Kayleen Memos, MD [facility] Pager [phone]  If 7PM-7AM, please contact night-coverage [email or web address] Password [facility] 02/23/2020, 4:17 PM

## 2020-02-23 NOTE — Evaluation (Signed)
Physical Therapy Evaluation Patient Details Name: Kiara Brown MRN: [id number] DOB: [birth year] Today's Date: 02/23/2020   History of Present Illness  79 y.o. female with medical history significant for chronic diastolic heart failure, right breast cancer s/p mastectomy on anastrozole, type 2 diabetes, rheumatoid arthritis, CAD, bil TKAs and hypertension and admitted for sepsis secondary to UTI.  Clinical Impression  Pt admitted with above diagnosis.  Pt currently with functional limitations due to the deficits listed below (see PT Problem List). Pt will benefit from skilled PT to increase their independence and safety with mobility to allow discharge to the venue listed below.   Pt reports feeling much better since admission.  Pt typically ambulatory at home without assistive device however encouraged her to use RW upon d/c for pain control for L knee (starting hurting after syncope episode in bathroom prior to admission).       Follow Up Recommendations No PT follow up    Equipment Recommendations  None recommended by PT    Recommendations for Other Services       Precautions / Restrictions Precautions Precautions: Fall      Mobility  Bed Mobility               General bed mobility comments: pt up in recliner  Transfers Overall transfer level: Needs assistance Equipment used: Rolling walker (2 wheeled) Transfers: Sit to/from Stand Sit to Stand: Min guard         General transfer comment: min/guard for safety  Ambulation/Gait Ambulation/Gait assistance: Min guard Gait Distance (Feet): 80 Feet Assistive device: Rolling walker (2 wheeled) Gait Pattern/deviations: Step-through pattern;Decreased stride length     General Gait Details: verbal cues for use of RW to assist with L LE weight bearing (states she hurt knee after syncope episode in bathroom prior to admission); SpO2 94% on room air upon returning to [employer]    Modified Rankin (Stroke Patients Only)       Balance Overall balance assessment: History of Falls(syncope episode prior to admission)                                           Pertinent Vitals/Pain Pain Assessment: 0-10 Pain Score: 4  Pain Location: left knee Pain Descriptors / Indicators: Sore Pain Intervention(s): Repositioned;Monitored during session    Home Living Family/patient expects to be discharged to:: Private residence Living Arrangements: Spouse/significant other   Type of Home: House       Home Layout: One level Home Equipment: Environmental consultant - 2 wheels;Cane - single point      Prior Function Level of Independence: Independent               Hand Dominance        Extremity/Trunk Assessment        Lower Extremity Assessment Lower Extremity Assessment: Generalized weakness       Communication   Communication: No difficulties  Cognition Arousal/Alertness: Awake/alert Behavior During Therapy: WFL for tasks assessed/performed Overall Cognitive Status: Within Functional Limits for tasks assessed                                        General Comments      Exercises  Assessment/Plan    PT Assessment Patient needs continued PT services  PT Problem List Decreased activity tolerance;Decreased strength;Pain;Decreased mobility       PT Treatment Interventions Gait training;DME instruction;Therapeutic activities;Therapeutic exercise;Functional mobility training;Balance training;Patient/family education    PT Goals (Current goals can be found in the Care Plan section)  Acute Rehab PT Goals PT Goal Formulation: With patient Time For Goal Achievement: 03/01/20 Potential to Achieve Goals: Good    Frequency Min 3X/week   Barriers to discharge        Co-evaluation               AM-PAC PT "6 Clicks" Mobility  Outcome Measure Help needed turning from your back to your side while in a  flat bed without using bedrails?: A Little Help needed moving from lying on your back to sitting on the side of a flat bed without using bedrails?: A Little Help needed moving to and from a bed to a chair (including a wheelchair)?: A Little Help needed standing up from a chair using your arms (e.g., wheelchair or bedside chair)?: A Little Help needed to walk in hospital room?: A Little Help needed climbing 3-5 steps with a railing? : A Little 6 Click Score: 18    End of Session Equipment Utilized During Treatment: Gait belt Activity Tolerance: Patient tolerated treatment well Patient left: in chair;with call bell/phone within reach;with chair alarm set Nurse Communication: Mobility status PT Visit Diagnosis: Other abnormalities of gait and mobility (R26.89)    Time: [id number] PT Time Calculation (min) (ACUTE ONLY): 14 min   Charges:   PT Evaluation $PT Eval Low Complexity: 1 Low        Kati PT, DPT Acute Rehabilitation Services Office: [phone]  York Ram E 02/23/2020, 1:10 PM

## 2020-02-24 ENCOUNTER — Inpatient Hospital Stay (HOSPITAL_COMMUNITY): Payer: Medicare Other

## 2020-02-24 LAB — CBC WITH DIFFERENTIAL/PLATELET
Abs Immature Granulocytes: 0.03 10*3/uL (ref 0.00–0.07)
Basophils Absolute: 0 10*3/uL (ref 0.0–0.1)
Basophils Relative: 1 %
Eosinophils Absolute: 0.2 10*3/uL (ref 0.0–0.5)
Eosinophils Relative: 3 %
HCT: 30.7 % — ABNORMAL LOW (ref 36.0–46.0)
Hemoglobin: 9.6 g/dL — ABNORMAL LOW (ref 12.0–15.0)
Immature Granulocytes: 0 %
Lymphocytes Relative: 24 %
Lymphs Abs: 2 10*3/uL (ref 0.7–4.0)
MCH: 26.7 pg (ref 26.0–34.0)
MCHC: 31.3 g/dL (ref 30.0–36.0)
MCV: 85.3 fL (ref 80.0–100.0)
Monocytes Absolute: 0.8 10*3/uL (ref 0.1–1.0)
Monocytes Relative: 9 %
Neutro Abs: 5.4 10*3/uL (ref 1.7–7.7)
Neutrophils Relative %: 63 %
Platelets: 246 10*3/uL (ref 150–400)
RBC: 3.6 MIL/uL — ABNORMAL LOW (ref 3.87–5.11)
RDW: 14.9 % (ref 11.5–15.5)
WBC: 8.5 10*3/uL (ref 4.0–10.5)
nRBC: 0 % (ref 0.0–0.2)

## 2020-02-24 LAB — BASIC METABOLIC PANEL
Anion gap: 10 (ref 5–15)
BUN: 9 mg/dL (ref 8–23)
CO2: 21 mmol/L — ABNORMAL LOW (ref 22–32)
Calcium: 8.5 mg/dL — ABNORMAL LOW (ref 8.9–10.3)
Chloride: 108 mmol/L (ref 98–111)
Creatinine, Ser: 0.95 mg/dL (ref 0.44–1.00)
GFR calc Af Amer: >60 mL/min (ref >60)
GFR calc non Af Amer: 57 mL/min — ABNORMAL LOW (ref >60)
Glucose, Bld: 122 mg/dL — ABNORMAL HIGH (ref 70–99)
Potassium: 4 mmol/L (ref 3.5–5.1)
Sodium: 139 mmol/L (ref 135–145)

## 2020-02-24 LAB — GLUCOSE, CAPILLARY: Glucose-Capillary: 127 mg/dL — ABNORMAL HIGH (ref 70–99)

## 2020-02-24 MED ORDER — CEPHALEXIN 500 MG PO CAPS: 500.0000 mg | ORAL_CAPSULE | Freq: Three times a day (TID) | ORAL | Status: DC

## 2020-02-24 MED ORDER — LIDOCAINE 5 % EX PTCH
1.0000 | MEDICATED_PATCH | CUTANEOUS | Status: DC
  Administered 2020-02-24: 11:00:00 1 via TRANSDERMAL
  Filled 2020-02-24: qty 1

## 2020-02-24 MED ORDER — PHENAZOPYRIDINE HCL 100 MG PO TABS
100.0000 mg | ORAL_TABLET | Freq: Three times a day (TID) | ORAL | Status: DC
  Filled 2020-02-24 (×2): qty 1

## 2020-02-24 MED ORDER — SACCHAROMYCES BOULARDII 250 MG PO CAPS: 250.0000 mg | ORAL_CAPSULE | Freq: Two times a day (BID) | ORAL | 0 refills | Status: AC

## 2020-02-24 MED ORDER — SACCHAROMYCES BOULARDII 250 MG PO CAPS: 250.0000 mg | ORAL_CAPSULE | Freq: Two times a day (BID) | ORAL | Status: DC

## 2020-02-24 MED ORDER — CEPHALEXIN 500 MG PO CAPS: 500.0000 mg | ORAL_CAPSULE | Freq: Three times a day (TID) | ORAL | 0 refills | Status: AC

## 2020-02-24 MED ORDER — OXYBUTYNIN CHLORIDE 5 MG PO TABS: 5.0000 mg | ORAL_TABLET | Freq: Three times a day (TID) | ORAL | Status: DC | PRN

## 2020-02-24 NOTE — Discharge Instructions (Signed)

## 2020-02-24 NOTE — Evaluation (Signed)
Occupational Therapy Evaluation Patient Details Name: Kiara Brown MRN: [id number] DOB: 1941-08-31 Today's Date: 02/24/2020    History of Present Illness 79 y.o. female with medical history significant for chronic diastolic heart failure, right breast cancer s/p mastectomy on anastrozole, type 2 diabetes, rheumatoid arthritis, CAD, bil TKAs and hypertension and admitted for sepsis secondary to UTI.   Clinical Impression   This 79 y/o female presents with the above. PTA pt reports independence with ADL, iADL and functional mobility. Pt completing room level mobility using RW this session at overall minguard assist; completing standing grooming ADL with minguard assist and requiring minA for LB ADL due to L knee pain (from recent fall d/t syncope episode PTA). Pt denies dizziness during this session. She reports plans to return home with spouse who is available intermittently to assist with ADL/iADL tasks PRN. Pt will benefit from continued acute OT services to maximize her safety and independence with ADL and mobility prior to discharge. Do not anticipate pt will require follow up OT services after discharge.     Follow Up Recommendations  No OT follow up;Supervision/Assistance - 24 hour(close to 24hr initially)    Equipment Recommendations  None recommended by OT           Precautions / Restrictions Precautions Precautions: Fall Restrictions Weight Bearing Restrictions: No      Mobility Bed Mobility Overal bed mobility: Needs Assistance Bed Mobility: Supine to Sit     Supine to sit: Supervision     General bed mobility comments: for general safety  Transfers Overall transfer level: Needs assistance Equipment used: Rolling walker (2 wheeled) Transfers: Sit to/from Stand Sit to Stand: Min guard         General transfer comment: min/guard for safety    Balance Overall balance assessment: History of Falls;Needs assistance(syncope episode PTA) Sitting-balance  support: Feet supported Sitting balance-Leahy Scale: Good     Standing balance support: Bilateral upper extremity supported;During functional activity Standing balance-Leahy Scale: Fair Standing balance comment: reliant on UE support at this time due to knee pain; pt using forearms for UE support while washing hands at sink                           ADL either performed or assessed with clinical judgement   ADL Overall ADL's : Needs assistance/impaired Eating/Feeding: Modified independent;Sitting Eating/Feeding Details (indicate cue type and reason): pt setup with breakfast tray end of session Grooming: Wash/dry hands;Min guard;Standing Grooming Details (indicate cue type and reason): standing at sink in room Upper Body Bathing: Supervision/ safety;Set up;Sitting   Lower Body Bathing: Minimal assistance;Sit to/from stand   Upper Body Dressing : Modified independent;Sitting   Lower Body Dressing: Minimal assistance;Sit to/from stand Lower Body Dressing Details (indicate cue type and reason): due to L knee pain  Toilet Transfer: Min guard;Ambulation;RW Toilet Transfer Details (indicate cue type and reason): simulated via transfer to recliner, room level mobility  Toileting- Clothing Manipulation and Hygiene: Min guard;Sit to/from Nurse, children's Details (indicate cue type and reason): educated to use shower seat initially after return home given L knee pain, for pain management and for energy conservation, pt verbalizing understanding/in agreement Functional mobility during ADLs: Min guard;Rolling walker                           Pertinent Vitals/Pain Pain Assessment: Faces Faces Pain Scale: Hurts even more Pain Location: left  knee Pain Descriptors / Indicators: Discomfort;Grimacing;Guarding Pain Intervention(s): Limited activity within patient's tolerance;Monitored during session;Repositioned     Hand Dominance     Extremity/Trunk  Assessment Upper Extremity Assessment Upper Extremity Assessment: Overall WFL for tasks assessed   Lower Extremity Assessment Lower Extremity Assessment: Defer to PT evaluation       Communication Communication Communication: No difficulties   Cognition Arousal/Alertness: Awake/alert Behavior During Therapy: WFL for tasks assessed/performed Overall Cognitive Status: Within Functional Limits for tasks assessed                                                      Home Living Family/patient expects to be discharged to:: Private residence Living Arrangements: Spouse/significant other Available Help at Discharge: Family;Available PRN/intermittently Type of Home: House       Home Layout: One level     Bathroom Shower/Tub: Occupational psychologist: Handicapped height     Home Equipment: Environmental consultant - 2 wheels;Cane - single point;Shower seat          Prior Functioning/Environment Level of Independence: Independent                 OT Problem List: Decreased strength;Impaired balance (sitting and/or standing);Decreased activity tolerance;Obesity;Pain;Decreased knowledge of precautions;Decreased knowledge of use of DME or AE      OT Treatment/Interventions: Self-care/ADL training;Therapeutic exercise;Energy conservation;DME and/or AE instruction;Therapeutic activities;Patient/family education;Balance training    OT Goals(Current goals can be found in the care plan section) Acute Rehab OT Goals Patient Stated Goal: home when able, back to independence OT Goal Formulation: With patient Time For Goal Achievement: 03/09/20 Potential to Achieve Goals: Good  OT Frequency: Min 2X/week   Barriers to D/C:            Co-evaluation              AM-PAC OT "6 Clicks" Daily Activity     Outcome Measure Help from another person eating meals?: None Help from another person taking care of personal grooming?: A Little Help from another person  toileting, which includes using toliet, bedpan, or urinal?: A Little Help from another person bathing (including washing, rinsing, drying)?: A Little Help from another person to put on and taking off regular upper body clothing?: None Help from another person to put on and taking off regular lower body clothing?: A Little 6 Click Score: 20   End of Session Equipment Utilized During Treatment: Rolling walker Nurse Communication: Mobility status  Activity Tolerance: Patient tolerated treatment well Patient left: in chair;with call bell/phone within reach;with chair alarm set  OT Visit Diagnosis: Other abnormalities of gait and mobility (R26.89);Pain;Muscle weakness (generalized) (M62.81) Pain - Right/Left: Left Pain - part of body: Knee                Time: [id number] OT Time Calculation (min): 16 min Charges:  OT General Charges $OT Visit: 1 Visit OT Evaluation $OT Eval Moderate Complexity: [place], OT Acute Rehabilitation Services Pager [phone] Office [phone]   Raymondo Band 02/24/2020, 10:15 AM

## 2020-02-24 NOTE — Discharge Summary (Signed)
Discharge Summary  Kiara Brown [id number] DOB: 1940-12-04  PCP: Burman Freestone, MD  Admit date: 02/21/2020 Discharge date: 02/24/2020  Time spent: 35 minutes   Recommendations for Outpatient Follow-up:  1. Follow-up with Urology in 1 week 2. Follow-up with your PCP in 1 to 2 weeks 3. Follow-up with your oncologist and rheumatologist 4. Take your medications as prescribed  Discharge Diagnoses:  Active Hospital Problems   Diagnosis Date Noted  . Sepsis secondary to UTI ([place]) 02/22/2020  . CAD (coronary artery disease) 02/22/2020  . Depression 02/22/2020  . Severe obesity ([place]) 12/02/2017  . Type II diabetes mellitus ([place] [place]) 08/10/2017  . Essential hypertension 08/10/2017  . Stage I breast cancer, right ([place]) 07/02/2017    Resolved Hospital Problems  No resolved problems to display.    Discharge Condition: Stable  Diet recommendation: Resume previous diet.  Vitals:   02/23/20 2138 02/24/20 0530  BP: (!) 156/60 (!) 153/62  Pulse: 73 71  Resp: 16 16  Temp: 99.1 F (37.3 C) 98.7 F (37.1 C)  SpO2: 94% 92%    History of present illness:  Kiara Brown a 79 y.o.femalewith medical history significant forchronic diastolic heart failure, right breast cancer s/p mastectomy on anastrozole, type 2 diabetes, rheumatoid arthritis, recurrent UTI followed by urology outpatient, CAD and hypertension who presents with concerns for dehydration and passing out after sudden nausea vomiting and diarrhea which she attributes to side effects from Pyridium.  States each time she has taken this she has had the same effects.  She declined to take it here.  Added to her list of allergic medications.  On presentation febrile with T-max 100.5, UA positive for pyuria, WBC and neutrophils elevated 20.6K and 19.1K respectively.  Started on broad spectrum IV antibiotics cefepime empirically for presumed sepsis secondary to UTI.  Urine culture showed multiple species present, suggesting  recollection.  Blood cultures negative to date.    Nausea, vomiting and diarrhea have resolved.  Tolerating oral intake.  Was assessed by PT with no further recommendations.  Denies dizziness.  Had some mild left knee pain.  X-ray of left knee nonacute, unremarkable for fracture or dislocation.  Recommend ice pack as needed.  Hospital course complicated by persistent dysuria with improvement on 02/24/2020.  Stating discomfort is about 3 out of 10.  No history of kidney stones.  Patient advised to follow-up with urology within a week.  She understands and agrees with plan.  Leukocytosis and neutrophilia has resolved.  Afebrile.  Vital signs and labs reviewed and are stable.     Hospital Course:  Principal Problem:   Sepsis secondary to UTI [facility]) Active Problems:   Stage I breast cancer, right ([place])   Essential hypertension   Type II diabetes mellitus ([place])   Severe obesity ([facility])   CAD (coronary artery disease)   Depression  Sepsis secondary to presumed UTI Presented with pyuria, leukocytosis, neutrophilia, and fever with T-max 100.5. She received initially in the ED IV vancomycin, cefepime and IV Flagyl due to sepsis presentation.   IV vancomycin and IV Flagyl were discontinued.    Completed 4 days of cefepime. Started Keflex 500 mg TID x 3 days, Florastor 250 mg BID x 3 days while on antibiotics to maintain your gut flora. Urine culture grew multiple species.  Suggestive of recollection.  However presented with significantly elevated WBC>20K and neutrophilia >19K with no other sources of infection.  Treating as a UTI. Blood cultures x2 negative to date.   Dysuria>>states allergic to  Pyridium. States improved on 3/31 after 4 days IV antibiotics Cefepime. Recommend close follow up with Urology outpatient  Dysuria Possibly related to presumed UTI States she did not tolerate pyridium, caused her to have nausea, vomiting and diarrhea No history of kidney stones She denied vaginal or  perineal irritation Improved today after 4 days of IV cefepime  Overactive bladder Continue oxybutynin PRN for bladder spasms Follow up with Urology  Chronic diastolic heart failure Grade 1 diastolic heart failure seen in echo in 2019 Euvolemic  History of right breast cancer s/p mastectomy Continue anastrozole Follow up with Oncology  Type 2 diabetes Hemoglobin A1c 7.0 Resume home regimen  Hypertension Continue PTA home medications  CAD continue aspirin and statin  Depression continue wellbutrin, trazodone  Morbid obesity BMI greater than 44 Recommend weight loss outpatient  Fall, questionable syncope in the setting of vomiting and diarrhea Hypovolemic on exam on presentation with concern for dehydration, responded well to IV fluid No dizziness, vital signs stable No evidence of acute ischemia on 12 lead EKG Assessed by PT OT no further recommendations Continue fall precautions at home  Left knee soreness Left knee Xray 3/31, non acute, no evidence of fracture or dislocation Apply ice pack as needed She states she has lidocaine patches at home that she could use if needed Advised to follow up with her orthopedic surgeon or rheumatologist PRN.  She understands and agrees to plan.  Resolved diarrhea   Code Status: Full    Discharge Exam: BP (!) 153/62 (BP Location: Left Arm)   Pulse 71   Temp 98.7 F (37.1 C) (Oral)   Resp 16   Ht 5\' 3"  (1.6 m)   Wt 113.4 kg   SpO2 92%   BMI 44.29 kg/m  . General: 79 y.o. year-old female well developed well nourished in no acute distress.  Alert and oriented x3. . Cardiovascular: Regular rate and rhythm with no rubs or gallops.  No thyromegaly or JVD noted.   Marland Kitchen Respiratory: Clear to auscultation with no wheezes or rales. Good inspiratory effort. . Abdomen: Soft nontender nondistended with normal bowel sounds x4 quadrants. . Musculoskeletal: No lower extremity edema bilaterally. Marland Kitchen Psychiatry: Mood is  appropriate for condition and setting  Discharge Instructions You were cared for by a hospitalist during your hospital stay. If you have any questions about your discharge medications or the care you received while you were in the hospital after you are discharged, you can call the unit and asked to speak with the hospitalist on call if the hospitalist that took care of you is not available. Once you are discharged, your primary care physician will handle any further medical issues. Please note that NO REFILLS for any discharge medications will be authorized once you are discharged, as it is imperative that you return to your primary care physician (or establish a relationship with a primary care physician if you do not have one) for your aftercare needs so that they can reassess your need for medications and monitor your lab values.   Allergies as of 02/24/2020      Reactions   Gabapentin Anxiety   Pyridium [phenazopyridine Hcl]    Nausea  Vomiting diarrhea   Atorvastatin Other (See Comments)   myalgias    Duloxetine Other (See Comments)   UNSPECIFIED REACTION    Nsaids Rash   Oseltamivir Diarrhea, Other (See Comments)   Abdominal pain      Medication List    TAKE these medications   acetaminophen 500  MG tablet Commonly known as: TYLENOL Take 500 mg by mouth every 6 (six) hours as needed for moderate pain or headache.   anastrozole 1 MG tablet Commonly known as: ARIMIDEX Take 1 tablet (1 mg total) by mouth daily.   aspirin EC 81 MG tablet Take 81 mg by mouth daily after breakfast.   atenolol 50 MG tablet Commonly known as: TENORMIN Take 50 mg by mouth daily before breakfast.   buPROPion 150 MG 24 hr tablet Commonly known as: WELLBUTRIN XL Take 150 mg by mouth daily before breakfast.   cephALEXin 500 MG capsule Commonly known as: KEFLEX Take 1 capsule (500 mg total) by mouth every 8 (eight) hours for 3 days.   ferrous sulfate 325 (65 FE) MG tablet Take 1 tablet (325 mg  total) by mouth 2 (two) times daily with a meal. What changed: when to take this   folic acid 1 MG tablet Commonly known as: FOLVITE Take 1 mg by mouth daily after breakfast.   glipiZIDE 5 MG 24 hr tablet Commonly known as: GLUCOTROL XL Take 5 mg by mouth daily with breakfast.   omeprazole 40 MG capsule Commonly known as: PRILOSEC Take 40 mg by mouth every morning.   oxybutynin 5 MG tablet Commonly known as: DITROPAN Take 5 mg by mouth in the morning, at noon, and at bedtime.   rosuvastatin 5 MG tablet Commonly known as: CRESTOR Take 1 tablet (5 mg total) by mouth every other day.   saccharomyces boulardii 250 MG capsule Commonly known as: FLORASTOR Take 1 capsule (250 mg total) by mouth 2 (two) times daily for 3 days.   traMADol 50 MG tablet Commonly known as: ULTRAM Take 50 mg by mouth every 6 (six) hours as needed for moderate pain or severe pain.   traZODone 50 MG tablet Commonly known as: DESYREL Take 50 mg by mouth at bedtime.      Allergies  Allergen Reactions  . Gabapentin Anxiety  . Pyridium [Phenazopyridine Hcl]     Nausea  Vomiting diarrhea  . Atorvastatin Other (See Comments)    myalgias   . Duloxetine Other (See Comments)    UNSPECIFIED REACTION   . Nsaids Rash  . Oseltamivir Diarrhea and Other (See Comments)    Abdominal pain    Follow-up Information    Burman Freestone, MD. Call in 1 day(s).   Specialty: Internal Medicine Why: Please call for a post hospital follow-up appointment. Contact information: [id number] [place] [address] [phone]        Ulyses Southward., MD. Call in 1 day(s).   Specialty: Urology Why: Please call for a post hospital follow-up appointment. Contact information: [address] [place] [address] [phone]        Nevada Crane, MD. Call in 1 day(s).   Specialty: Internal Medicine Why: Please call for a post hospital follow-up appointment. Contact  information: [place] [address] [phone]        Magrinat, Virgie Dad, MD Follow up.   Specialty: Oncology Why: Please call for a post hospital follow-up appointment. Contact information: [place] [address] [phone]            The results of significant diagnostics from this hospitalization (including imaging, microbiology, ancillary and laboratory) are listed below for reference.    Significant Diagnostic Studies: DG Chest Port 1 View  Result Date: 02/21/2020 CLINICAL DATA:  Fever. EXAM: PORTABLE CHEST 1  VIEW COMPARISON:  07/02/2017 FINDINGS: The heart size is stable from prior study. Aortic calcifications are noted. There is no pneumothorax or large pleural effusion. There is some atelectasis versus scarring at the lung bases, left worse than right. There is no acute osseous abnormality. IMPRESSION: No active disease. Electronically Signed   By: Constance Holster M.D.   On: 02/21/2020 18:14   DG Knee Left Port  Result Date: 02/24/2020 CLINICAL DATA:  [place]. Left knee pain. EXAM: PORTABLE LEFT KNEE - 1-2 VIEW COMPARISON:  None. 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 No periprosthetic fractures identified. No obvious joint effusion. IMPRESSION: Intact total knee arthroplasty components. No fractures are identified. Electronically Signed   By: Marijo Sanes M.D.   On: 02/24/2020 10:45    Microbiology: Recent Results (from the past 240 hour(s))  Blood Culture (routine x 2)     Status: None (Preliminary result)   Collection Time: 02/21/20  5:55 PM   Specimen: BLOOD  Result Value Ref Range Status   Specimen Description   Final    BLOOD LEFT ANTECUBITAL Performed at [facility] [address], [place], [place] [address]    Special Requests   Final    BOTTLES DRAWN AEROBIC AND ANAEROBIC Blood Culture adequate volume Performed at [facility]  [address] [place], [place] [address]    Culture   Final    NO GROWTH 2 DAYS Performed at [facility] [address], [place], [place] [address]    Report Status PENDING  Incomplete  Urine culture     Status: Abnormal   Collection Time: 02/21/20  5:55 PM   Specimen: In/Out Cath Urine  Result Value Ref Range Status   Specimen Description   Final    IN/OUT CATH URINE Performed at [facility] [address] [place], [place] [address]    Special Requests   Final    NONE Performed at [facility], [place] [address], [place], [place] [address]    Culture MULTIPLE SPECIES PRESENT, SUGGEST RECOLLECTION (A)  Final   Report Status 02/22/2020 FINAL  Final  Respiratory Panel by RT PCR (Flu A&B, Covid) - Nasopharyngeal Swab     Status: None   Collection Time: 02/21/20  5:58 PM   Specimen: Nasopharyngeal Swab  Result Value Ref Range Status   SARS Coronavirus 2 by RT PCR NEGATIVE NEGATIVE Final    Comment: (NOTE) SARS-CoV-2 target nucleic acids are NOT DETECTED. The SARS-CoV-2 RNA is generally detectable in upper respiratoy specimens during the acute phase of infection. The lowest concentration of SARS-CoV-2 viral copies this assay can detect is 131 copies/mL. A negative result does not preclude SARS-Cov-2 infection and should not be used as the sole basis for treatment or other patient management decisions. A negative result may occur with  improper specimen collection/handling, submission of specimen other than nasopharyngeal swab, presence of viral mutation(s) within the areas targeted by this assay, and inadequate number of viral copies (<131 copies/mL). A negative result must be combined with clinical observations, patient history, and epidemiological information. The expected result is Negative. Fact Sheet for Patients:  PinkCheek.be Fact Sheet for Healthcare Providers:   GravelBags.it This test is not yet ap proved or cleared by the [place] FDA and  has been authorized for detection and/or diagnosis of SARS-CoV-2 by FDA under an Emergency Use Authorization (EUA). This EUA will remain  in effect (meaning this test can be used) for the duration of  the COVID-19 declaration under Section 564(b)(1) of the Act, 21 U.S.C. section 360bbb-3(b)(1), unless the authorization is terminated or revoked sooner.    Influenza A by PCR NEGATIVE NEGATIVE Final   Influenza B by PCR NEGATIVE NEGATIVE Final    Comment: (NOTE) The Xpert Xpress SARS-CoV-2/FLU/RSV assay is intended as an aid in  the diagnosis of influenza from Nasopharyngeal swab specimens and  should not be used as a sole basis for treatment. Nasal washings and  aspirates are unacceptable for Xpert Xpress SARS-CoV-2/FLU/RSV  testing. Fact Sheet for Patients: PinkCheek.be Fact Sheet for Healthcare Providers: GravelBags.it This test is not yet approved or cleared by the [place] FDA and  has been authorized for detection and/or diagnosis of SARS-CoV-2 by  FDA under an Emergency Use Authorization (EUA). This EUA will remain  in effect (meaning this test can be used) for the duration of the  Covid-19 declaration under Section 564(b)(1) of the Act, 21  U.S.C. section 360bbb-3(b)(1), unless the authorization is  terminated or revoked. Performed at [facility], [place] [address] [place], [place] [address]   Blood Culture (routine x 2)     Status: None (Preliminary result)   Collection Time: 02/22/20  5:26 AM   Specimen: BLOOD LEFT HAND  Result Value Ref Range Status   Specimen Description   Final    BLOOD LEFT HAND Performed at [facility] [address], [place], [place] [address]    Special Requests   Final    BOTTLES DRAWN AEROBIC ONLY Blood Culture adequate  volume Performed at [facility] [address], [place], [place] [address]    Culture   Final    NO GROWTH 1 DAY Performed at [facility], [place] [address], [place], [place] [address]    Report Status PENDING  Incomplete     Labs: Basic Metabolic Panel: Recent Labs  Lab 02/21/20 1755 02/22/20 0526 02/23/20 0623 02/24/20 0536  NA 133* 133* 137 139  K 4.4 3.9 3.6 4.0  CL 101 104 107 108  CO2 22 21* 22 21*  GLUCOSE 142* 116* 105* 122*  BUN 21 15 8 9   CREATININE 1.02* 0.96 0.87 0.95  CALCIUM 8.8* 8.3* 8.4* 8.5*   Liver Function Tests: Recent Labs  Lab 02/21/20 1755  AST 22  ALT 16  ALKPHOS 107  BILITOT 0.8  PROT 7.1  ALBUMIN 3.6   Recent Labs  Lab 02/21/20 1755  LIPASE 29   No results for input(s): AMMONIA in the last 168 hours. CBC: Recent Labs  Lab 02/21/20 1755 02/22/20 0526 02/23/20 0623 02/24/20 0536  WBC 20.6* 14.2* 9.3 8.5  NEUTROABS 19.1*  --  6.2 5.4  HGB 11.2* 9.3* 9.4* 9.6*  HCT 35.2* 30.2* 31.0* 30.7*  MCV 85.0 85.1 87.1 85.3  PLT 353 284 277 246   Cardiac Enzymes: No results for input(s): CKTOTAL, CKMB, CKMBINDEX, TROPONINI in the last 168 hours. BNP: BNP (last 3 results) No results for input(s): BNP in the last 8760 hours.  ProBNP (last 3 results) No results for input(s): PROBNP in the last 8760 hours.  CBG: Recent Labs  Lab 02/23/20 0731 02/23/20 1250 02/23/20 1713 02/23/20 2135 02/24/20 0744  GLUCAP 101* 158* 141* 137* 127*       Signed:  Kayleen Memos, MD [facility] 02/24/2020, 11:42 AM

## 2020-02-26 LAB — CULTURE, BLOOD (ROUTINE X 2)
Culture: NO GROWTH
Special Requests: ADEQUATE

## 2020-02-29 LAB — CULTURE, BLOOD (ROUTINE X 2)
Culture: NO GROWTH
Special Requests: ADEQUATE

## 2020-03-07 DIAGNOSIS — M5030 Other cervical disc degeneration, unspecified cervical region: Secondary | ICD-10-CM | POA: Diagnosis not present

## 2020-03-07 DIAGNOSIS — M47812 Spondylosis without myelopathy or radiculopathy, cervical region: Secondary | ICD-10-CM | POA: Diagnosis not present

## 2020-03-07 DIAGNOSIS — M7918 Myalgia, other site: Secondary | ICD-10-CM | POA: Diagnosis not present

## 2020-03-08 DIAGNOSIS — M47812 Spondylosis without myelopathy or radiculopathy, cervical region: Secondary | ICD-10-CM | POA: Diagnosis not present

## 2020-03-08 DIAGNOSIS — Z789 Other specified health status: Secondary | ICD-10-CM | POA: Diagnosis not present

## 2020-03-08 DIAGNOSIS — R29898 Other symptoms and signs involving the musculoskeletal system: Secondary | ICD-10-CM | POA: Diagnosis not present

## 2020-03-08 DIAGNOSIS — M5030 Other cervical disc degeneration, unspecified cervical region: Secondary | ICD-10-CM | POA: Diagnosis not present

## 2020-03-08 DIAGNOSIS — M4802 Spinal stenosis, cervical region: Secondary | ICD-10-CM | POA: Diagnosis not present

## 2020-03-08 DIAGNOSIS — M542 Cervicalgia: Secondary | ICD-10-CM | POA: Diagnosis not present

## 2020-03-08 DIAGNOSIS — Z7409 Other reduced mobility: Secondary | ICD-10-CM | POA: Diagnosis not present

## 2020-03-08 DIAGNOSIS — M5412 Radiculopathy, cervical region: Secondary | ICD-10-CM | POA: Diagnosis not present

## 2020-03-16 DIAGNOSIS — Z171 Estrogen receptor negative status [ER-]: Secondary | ICD-10-CM | POA: Diagnosis not present

## 2020-03-16 DIAGNOSIS — Z87440 Personal history of urinary (tract) infections: Secondary | ICD-10-CM | POA: Diagnosis not present

## 2020-03-16 DIAGNOSIS — C50211 Malignant neoplasm of upper-inner quadrant of right female breast: Secondary | ICD-10-CM | POA: Diagnosis not present

## 2020-03-16 DIAGNOSIS — R8290 Unspecified abnormal findings in urine: Secondary | ICD-10-CM | POA: Diagnosis not present

## 2020-04-05 DIAGNOSIS — Z87440 Personal history of urinary (tract) infections: Secondary | ICD-10-CM | POA: Diagnosis not present

## 2020-04-19 DIAGNOSIS — G8929 Other chronic pain: Secondary | ICD-10-CM | POA: Diagnosis not present

## 2020-04-19 DIAGNOSIS — M542 Cervicalgia: Secondary | ICD-10-CM | POA: Diagnosis not present

## 2020-04-19 DIAGNOSIS — M47812 Spondylosis without myelopathy or radiculopathy, cervical region: Secondary | ICD-10-CM | POA: Diagnosis not present

## 2020-05-02 DIAGNOSIS — M47812 Spondylosis without myelopathy or radiculopathy, cervical region: Secondary | ICD-10-CM | POA: Diagnosis not present

## 2020-05-02 DIAGNOSIS — M542 Cervicalgia: Secondary | ICD-10-CM | POA: Diagnosis not present

## 2020-05-16 DIAGNOSIS — M0609 Rheumatoid arthritis without rheumatoid factor, multiple sites: Secondary | ICD-10-CM | POA: Diagnosis not present

## 2020-05-16 DIAGNOSIS — Z791 Long term (current) use of non-steroidal anti-inflammatories (NSAID): Secondary | ICD-10-CM | POA: Diagnosis not present

## 2020-05-16 DIAGNOSIS — M5030 Other cervical disc degeneration, unspecified cervical region: Secondary | ICD-10-CM | POA: Diagnosis not present

## 2020-05-16 DIAGNOSIS — D649 Anemia, unspecified: Secondary | ICD-10-CM | POA: Diagnosis not present

## 2020-05-16 DIAGNOSIS — Z79899 Other long term (current) drug therapy: Secondary | ICD-10-CM | POA: Diagnosis not present

## 2020-05-16 DIAGNOSIS — M159 Polyosteoarthritis, unspecified: Secondary | ICD-10-CM | POA: Diagnosis not present

## 2020-06-21 DIAGNOSIS — G8929 Other chronic pain: Secondary | ICD-10-CM | POA: Diagnosis not present

## 2020-06-21 DIAGNOSIS — M47812 Spondylosis without myelopathy or radiculopathy, cervical region: Secondary | ICD-10-CM | POA: Diagnosis not present

## 2020-06-27 DIAGNOSIS — Z6841 Body Mass Index (BMI) 40.0 and over, adult: Secondary | ICD-10-CM | POA: Diagnosis not present

## 2020-06-27 DIAGNOSIS — Z01419 Encounter for gynecological examination (general) (routine) without abnormal findings: Secondary | ICD-10-CM | POA: Diagnosis not present

## 2020-06-29 DIAGNOSIS — L821 Other seborrheic keratosis: Secondary | ICD-10-CM | POA: Diagnosis not present

## 2020-06-29 DIAGNOSIS — D1801 Hemangioma of skin and subcutaneous tissue: Secondary | ICD-10-CM | POA: Diagnosis not present

## 2020-06-29 DIAGNOSIS — L718 Other rosacea: Secondary | ICD-10-CM | POA: Diagnosis not present

## 2020-07-07 DIAGNOSIS — M47812 Spondylosis without myelopathy or radiculopathy, cervical region: Secondary | ICD-10-CM | POA: Diagnosis not present

## 2020-07-13 DIAGNOSIS — Z1231 Encounter for screening mammogram for malignant neoplasm of breast: Secondary | ICD-10-CM | POA: Diagnosis not present

## 2020-07-21 DIAGNOSIS — F5101 Primary insomnia: Secondary | ICD-10-CM | POA: Diagnosis not present

## 2020-07-21 DIAGNOSIS — I5032 Chronic diastolic (congestive) heart failure: Secondary | ICD-10-CM | POA: Diagnosis not present

## 2020-07-21 DIAGNOSIS — I110 Hypertensive heart disease with heart failure: Secondary | ICD-10-CM | POA: Diagnosis not present

## 2020-07-21 DIAGNOSIS — Z171 Estrogen receptor negative status [ER-]: Secondary | ICD-10-CM | POA: Diagnosis not present

## 2020-07-21 DIAGNOSIS — E1159 Type 2 diabetes mellitus with other circulatory complications: Secondary | ICD-10-CM | POA: Diagnosis not present

## 2020-07-21 DIAGNOSIS — I2510 Atherosclerotic heart disease of native coronary artery without angina pectoris: Secondary | ICD-10-CM | POA: Diagnosis not present

## 2020-07-21 DIAGNOSIS — N390 Urinary tract infection, site not specified: Secondary | ICD-10-CM | POA: Diagnosis not present

## 2020-07-21 DIAGNOSIS — M0579 Rheumatoid arthritis with rheumatoid factor of multiple sites without organ or systems involvement: Secondary | ICD-10-CM | POA: Diagnosis not present

## 2020-07-21 DIAGNOSIS — Z7984 Long term (current) use of oral hypoglycemic drugs: Secondary | ICD-10-CM | POA: Diagnosis not present

## 2020-07-21 DIAGNOSIS — C50211 Malignant neoplasm of upper-inner quadrant of right female breast: Secondary | ICD-10-CM | POA: Diagnosis not present

## 2020-07-21 DIAGNOSIS — Z6841 Body Mass Index (BMI) 40.0 and over, adult: Secondary | ICD-10-CM | POA: Diagnosis not present

## 2020-07-26 DIAGNOSIS — M47812 Spondylosis without myelopathy or radiculopathy, cervical region: Secondary | ICD-10-CM | POA: Diagnosis not present

## 2020-08-03 DIAGNOSIS — Z23 Encounter for immunization: Secondary | ICD-10-CM | POA: Diagnosis not present

## 2020-08-25 DIAGNOSIS — H35372 Puckering of macula, left eye: Secondary | ICD-10-CM | POA: Diagnosis not present

## 2020-08-28 NOTE — Progress Notes (Signed)
[place]  [phone] [phone]     ID: BRAD MCGAUGHY DOB: 1940/12/17  MR#: [id number]  RSW#:[id number]  Patient Care Team: Burman Freestone, MD as PCP - General (Internal Medicine) Excell Seltzer, MD (Inactive) as Consulting Physician (General Surgery) Cleo Villamizar, Virgie Dad, MD as Consulting Physician (Oncology) Gery Pray, MD as Consulting Physician (Radiation Oncology) [place] Crane, MD as Referring Physician (Internal Medicine) Bensimhon, Shaune Pascal, MD as Consulting Physician (Cardiology) Ulyses Southward., MD as Consulting Physician (Urology) Posey Pronto, Janus Jonelle Sidle, MD as Consulting Physician (Pain Medicine) OTHER MD:  CHIEF COMPLAINT: 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 This showed a T-score of -0.5, which is considered normal.  Since her last visit, she underwent cervical spine MRI on 3/22/20921 at [facility]. This showed: multilevel spondylosis with resultant mild to moderate spinal stenosis at C3-4 through C6-7.  She was referred to Dr. Posey Pronto in pain management on 03/07/2020.  She is also being followed by Dr. Venia Minks at [facility] for recurrent UTIs.   REVIEW OF SYSTEMS: Kiara Brown    BREAST CANCER HISTORY: From the original intake note:  "Kiara Brown" herself noted a change around her nipple on the right and brought it to medical attention. She was set up for bilateral diagnostic mammography with tomography and right breast ultrasonography at [facility] 05/23/2017. The breast density was category C. In the right breast upper inner quadrant there was a 2.5 cm area of grouped pleomorphic calcifications correlating with the palpable mass. Also there was a 1.2 cm macrolobulated mass in the upper inner quadrant also in the palpable region.  These masses were identifiable by ultrasound with the same dimensions. The right axilla was sonographically negative.  On 05/27/2017 the patient underwent biopsy of the 2 masses in question. 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  The patient's subsequent history is as detailed below.   PAST MEDICAL HISTORY: Past Medical History:  Diagnosis Date  . Arthritis    osteo, rheumatoid  . Breast cancer ([place])   . Depression 02/22/2020  . Diverticulosis   . Dysrhythmia    hx rapid heartbeat, no cardiologist  . Family history of breast cancer   . Family history of prostate cancer   . Fibromyalgia    peripheral neuropathy  . Fibromyalgia   . GERD (gastroesophageal reflux disease)   . H/O hiatal hernia   . Hyperlipidemia   . PONV (postoperative nausea and vomiting)    "THROAT WITH EXTREME [id number], ulnar nerve surgery  . Sciatica   . Shortness of breath dyspnea    with exertion  . UTI (lower urinary tract infection)     PAST SURGICAL HISTORY: Past Surgical History:  Procedure Laterality Date  . ABDOMINAL HYSTERECTOMY    . APPENDECTOMY    . back injection      July 2018- x1  . BREAST SURGERY Left 1986   biopsy, Recent 05/2017 at [facility]- had biopsy on R breast- malignant   .  CARDIAC CATHETERIZATION  2009   "no blockage per pt"  . CHOLECYSTECTOMY    . CYSTOSCOPY WITH BIOPSY    . INCONTINENCE SURGERY    . IRRIGATION AND DEBRIDEMENT ABSCESS Right 01/28/2018   Procedure: IRRIGATION AND DEBRIDEMENT AXILLARY ABSCESS;  Surgeon: Excell Seltzer, MD;  Location: [facility];  Service:  General;  Laterality: Right;  . JOINT REPLACEMENT    . KNEE ARTHROSCOPY     bilateral  . KNEE ARTHROSCOPY Left 03/17/2014   Procedure: ARTHROSCOPY LEFT KNEE WITH SYNOVECTOMY;  Surgeon: Gearlean Alf, MD;  Location: [facility];  Service: Orthopedics;  Laterality: Left;  . PORTACATH PLACEMENT Left 07/02/2017   Procedure: INSERTION PORT-A-CATH;  Surgeon: Excell Seltzer, MD;  Location: [place];  Service: General;  Laterality: Left;  . RIGHT/LEFT HEART CATH AND CORONARY ANGIOGRAPHY N/A 08/30/2017   Procedure: RIGHT/LEFT HEART CATH AND CORONARY ANGIOGRAPHY;  Surgeon: Larey Dresser, MD;  Location: [place] [facility];  Service: Cardiovascular;  Laterality: N/A;  . ROTATOR CUFF REPAIR     right  . SIMPLE MASTECTOMY WITH AXILLARY SENTINEL NODE BIOPSY Right 07/02/2017   Procedure: RIGHT TOTAL  MASTECTOMY WITH AXILLARY SENTINEL LYMPH  NODE BIOPSY;  Surgeon: Excell Seltzer, MD;  Location: [place];  Service: General;  Laterality: Right;  . TOTAL KNEE ARTHROPLASTY  08/04/2012   Procedure: TOTAL KNEE ARTHROPLASTY;  Surgeon: Gearlean Alf, MD;  Location: [facility];  Service: Orthopedics;  Laterality: Left;  . TOTAL KNEE ARTHROPLASTY Right 04/11/2015   Procedure: RIGHT TOTAL KNEE ARTHROPLASTY;  Surgeon: Gaynelle Arabian, MD;  Location: [facility];  Service: Orthopedics;  Laterality: Right;  . ulnar nerve surgery Right 1986    FAMILY HISTORY Family History  Problem Relation Age of Onset  . Hypertension Mother   . Stroke Mother   . Breast cancer Mother 46       died at 76  . Hypertension Father   . Heart attack Father        died at 90  . Hypertension Brother        MI  . Prostate cancer Brother 6  . Hypertension Brother        MI  . Lung cancer Brother        possible lung cancer, spot found on lung being monitored  . Hypertension Brother        MI  . Non-Hodgkin's lymphoma Sister 54  . Breast cancer Other 28       is currently 90  . Breast cancer Other 55  . Cancer Cousin 62       type of cancer unk.    . Cancer Other        type unknown, eye/face   . Cancer Other 65       died from cancer in his 52's, type unknown   The patient's father died from a heart attack at age 25. The patient's mother was diagnosed with breast cancer at age 33 and died at age 25. The patient had 5 brothers, 3 sisters. One sister had breast cancer at age 37. One brother was diagnosed with prostate cancer at age 80. One Brother died from lung cancer at age 14. One sister had non-Hodgkin's lymphoma diagnosed at age 29.    GYNECOLOGIC HISTORY:  No LMP recorded. Patient has had a hysterectomy.  menarche age 45, first live birth age 18. All 3 of her births were premature including 1 set of twins. One of the twins died a few hours after birth. She  underwent hysterectomy in 1981 with unilateral salpingo-oophorectomy. 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  She has led groups as far away as [place] and [place] and has led many in the [place] cruises as well.  Her husband, Berneta Sages began a business installing fences which is now carried on by his son Dorothea Ogle. Berneta Sages is also the church organist. Son, Darnelle Maffucci is a truck Geophysicist/field seismologist in [employer]. The patient has one grandchild. She is a Tourist information centre manager.     ADVANCED DIRECTIVES: In place. The patient's husband is her healthcare power of attorney   HEALTH MAINTENANCE: Social History   Tobacco Use  . Smoking status: Never Smoker  . Smokeless tobacco: Never Used  Substance Use Topics  . Alcohol use: No  . Drug use: No     Colonoscopy:2017 / [place]   PAP:  Bone density: 2019/ [place] normal   Allergies  Allergen Reactions  . Gabapentin Anxiety  . Pyridium [Phenazopyridine Hcl]     Nausea  Vomiting diarrhea  . Atorvastatin Other (See Comments)    myalgias   . Duloxetine Other (See Comments)    UNSPECIFIED REACTION   . Nsaids Rash  . Oseltamivir  Diarrhea and Other (See Comments)    Abdominal pain     Current Outpatient Medications  Medication Sig Dispense Refill  . acetaminophen (TYLENOL) 500 MG tablet Take 500 mg by mouth every 6 (six) hours as needed for moderate pain or headache.     . anastrozole (ARIMIDEX) 1 MG tablet Take 1 tablet (1 mg total) by mouth daily. 90 tablet 4  . aspirin EC 81 MG tablet Take 81 mg by mouth daily after breakfast.     . atenolol (TENORMIN) 50 MG tablet Take 50 mg by mouth daily before breakfast.     . buPROPion (WELLBUTRIN XL) 150 MG 24 hr tablet Take 150 mg by mouth daily before breakfast.    . ferrous sulfate 325 (65 FE) MG tablet Take 1 tablet (325 mg total) by mouth 2 (two) times daily with a meal. (Patient taking differently: Take 325 mg by mouth daily after breakfast. ) 60 tablet 0  . folic acid (FOLVITE) 1 MG tablet Take 1 mg by mouth daily after breakfast.     . glipiZIDE (GLUCOTROL XL) 5 MG 24 hr tablet Take 5 mg by mouth daily with breakfast.     . omeprazole (PRILOSEC) 40 MG capsule Take 40 mg by mouth every morning.    Marland Kitchen oxybutynin (DITROPAN) 5 MG tablet Take 5 mg by mouth in the morning, at noon, and at bedtime.    . rosuvastatin (CRESTOR) 5 MG tablet Take 1 tablet (5 mg total) by mouth every other day. 45 tablet 3  . traMADol (ULTRAM) 50 MG tablet Take 50 mg by mouth every 6 (six) hours as needed for moderate pain or severe pain.     . traZODone (DESYREL) 50 MG tablet Take 50 mg by mouth at bedtime.      No current facility-administered medications for this visit.    OBJECTIVE:   There were no vitals filed for this visit.   There is no height or weight on file to calculate BMI.   Wt Readings from Last 3 Encounters:  02/21/20 250 lb (113.4 kg)  08/27/19 251 lb 9.6 oz (114.1 kg)  04/01/19 240 lb (108.9 kg)      LAB RESULTS:  CMP     Component Value Date/Time   NA 139 02/24/2020 0536   NA 131 (L) 09/06/2017 1018   K 4.0 02/24/2020 0536   K 4.2 09/06/2017 1018   CL 108  02/24/2020 0536   CO2 21 (L) 02/24/2020 0536   CO2 25 09/06/2017 1018   GLUCOSE 122 (H) 02/24/2020 0536   GLUCOSE 187 (H) 09/06/2017 1018   BUN 9 02/24/2020 0536   BUN 11.2 09/06/2017 1018   CREATININE 0.95 02/24/2020 0536   CREATININE 1.04 02/21/2018 1139   CREATININE 1.2 (H) 09/06/2017 1018   CALCIUM 8.5 (L) 02/24/2020 0536   CALCIUM 9.3 09/06/2017 1018   PROT 7.1 02/21/2020 1755   PROT 6.9 09/06/2017 1018   ALBUMIN 3.6 02/21/2020 1755   ALBUMIN 3.4 (L) 09/06/2017 1018   AST 22 02/21/2020 1755   AST 20 09/06/2017 1018   ALT 16 02/21/2020 1755   ALT 14 09/06/2017 1018   ALKPHOS 107 02/21/2020 1755   ALKPHOS 103 09/06/2017 1018   BILITOT 0.8 02/21/2020 1755   BILITOT 0.57 09/06/2017 1018   GFRNONAA 57 (L) 02/24/2020 0536   GFRNONAA 51 (L) 02/21/2018 1139   GFRAA >60 02/24/2020 0536   GFRAA 59 (L) 02/21/2018 1139    No results found for: TOTALPROTELP, ALBUMINELP, A1GS, A2GS, BETS, BETA2SER, GAMS, MSPIKE, SPEI  No results found for: Nils Pyle, [facility]  Lab Results  Component Value Date   WBC 8.5 02/24/2020   NEUTROABS 5.4 02/24/2020   HGB 9.6 (L) 02/24/2020   HCT 30.7 (L) 02/24/2020   MCV 85.3 02/24/2020   PLT 246 02/24/2020      Chemistry      Component Value Date/Time   NA 139 02/24/2020 0536   NA 131 (L) 09/06/2017 1018   K 4.0 02/24/2020 0536   K 4.2 09/06/2017 1018   CL 108 02/24/2020 0536   CO2 21 (L) 02/24/2020 0536   CO2 25 09/06/2017 1018   BUN 9 02/24/2020 0536   BUN 11.2 09/06/2017 1018   CREATININE 0.95 02/24/2020 0536   CREATININE 1.04 02/21/2018 1139   CREATININE 1.2 (H) 09/06/2017 1018      Component Value Date/Time   CALCIUM 8.5 (L) 02/24/2020 0536   CALCIUM 9.3 09/06/2017 1018   ALKPHOS 107 02/21/2020 1755   ALKPHOS 103 09/06/2017 1018   AST 22 02/21/2020 1755   AST 20 09/06/2017 1018   ALT 16 02/21/2020 1755   ALT 14 09/06/2017 1018   BILITOT 0.8 02/21/2020 1755   BILITOT 0.57 09/06/2017 1018       No  results found for: LABCA2  No components found for: [id number]  No results for input(s): INR in the last 168 hours.  Urinalysis    Component Value Date/Time   COLORURINE AMBER (A) 02/21/2020 1755   APPEARANCEUR CLEAR 02/21/2020 1755   LABSPEC 1.014 02/21/2020 1755   PHURINE 5.0 02/21/2020 1755   GLUCOSEU NEGATIVE 02/21/2020 1755   HGBUR MODERATE (A) 02/21/2020 1755   BILIRUBINUR NEGATIVE 02/21/2020 1755   KETONESUR NEGATIVE 02/21/2020 1755   PROTEINUR NEGATIVE 02/21/2020 1755   UROBILINOGEN 0.2 04/06/2015 1332   NITRITE POSITIVE (A) 02/21/2020 1755   LEUKOCYTESUR MODERATE (A) 02/21/2020 1755    STUDIES: No results found.   ELIGIBLE FOR AVAILABLE RESEARCH PROTOCOL: No  ASSESSMENT: 79 y.o. [place] woman status post right breast upper inner quadrant biopsy 2 on 05/27/2017 showing a clinical T1c N0, stage IA invasive ductal carcinoma, grade 3, estrogen and progesterone receptor positive, HER-2 amplified, with an MIB-1 of  60%  (1) genetics testing 07/14/2017 through the Multi-Cancer panel + Breast Cancer Panel found no deleterious mutations in  ALK, APC,AKT1, ATM, AXIN2,BAP1,  BARD1, BLM, BMPR1A, BRCA1, BRCA2, BRIP1, CASR, CDC73, CDH1, CDK4, CDKN1B, CDKN1C, CDKN2A (p14ARF), CDKN2A (p16INK4a), CEBPA, CHEK2, CTNNA1, DICER1, DIS3L2, EGFR (c.2369C>T, p.Thr790Met variant only), EPCAM (Deletion/duplication testing only), FH, FAM175A, FLCN, GATA2, GPC3, GREM1 (Promoter region deletion/duplication testing only), HOXB13 (c.251G>A, p.Gly84Glu), HRAS, KIT, MAX, MEN1, MET, MITF (c.952G>A, p.Glu318Lys variant only), MLH1, MSH2, MSH3, MSH6, MUTYH, NBN, NF1, NF2, NTHL1, PALB2, PDGFRA, PHOX2B, PMS2, POLD1, POLE, POT1, PRKAR1A, PTCH1, PTEN, RAD50, RAD51C, RAD51D, RB1, RECQL4, RET, RUNX1, SDHAF2, SDHA (sequence changes only), SDHB, SDHC, SDHD, SMAD4, SMARCA4, SMARCB1, SMARCE1, STK11, SUFU, TERC, TERT, TMEM127, TP53, TSC1, TSC2, VHL, WRN, WT1, FANCC,MRE11,PIK3CA,RINT1,XRCC2.  (a)  3 Variants of  Uncertain significance (VUS) were identified.  BRCA1 c.2551G>A (p.Glu851Lys)----CHEK2 c.679G>A (p.Gly227Arg).  The CHECK 2 variant has been reported as possibly mosaic--- SDHA c.704T>C (p.Ile235Thr).  (2) anastrozole started 06/05/2017, to be continued and minimum of 5 years   (a) bone density 06/03/2018 showed a T score of -0.5 (normal).  (3) status post right mastectomy and sentinel lymph node sampling 07/02/2017 for an mpT2 pN0, stage IB invasive ductal carcinoma, grade 3, with negative margins.  (a) the patient opted against reconstruction  (4) trastuzumab for 6 months started 08/09/2017, discontinued after one dose with significant reaction  (a) baseline echocardiogram 06/19/2017 shows an excellent ejection fraction  (b) Echocardiogram 09/16/2017 showed an ejection fraction of 60-65%  (5) started lapatinib 12/02/2017-- to be continued 6 months  (a) echocardiogram 01/21/2018 shows an ejection fraction in the 60-65%.  (b) echocardiogram 06/02/2018 showed an ejection fraction in the 55-60% range   PLAN: Kiara Brown did not show for her visit 08/29/2020.  A follow-up letter has been sent   Iliany Losier, Virgie Dad, MD  08/29/20 6:23 PM Medical Oncology and Hematology [facility] [place], [place] [address] Tel. [phone]    Fax. [phone]   I, Wilburn Mylar, am acting as scribe for Dr. Virgie Dad. Dereka Lueras.  I, Lurline Del MD, have reviewed the above documentation for accuracy and completeness, and I agree with the above.   *Total Encounter Time as defined by the Centers for Medicare and Medicaid Services includes, in addition to the face-to-face time of a patient visit (documented in the note above) non-face-to-face time: obtaining and reviewing outside history, ordering and reviewing medications, tests or procedures, care coordination (communications with other health care professionals or caregivers) and documentation in the medical record.

## 2020-08-29 ENCOUNTER — Inpatient Hospital Stay: Payer: Medicare Other | Attending: Oncology | Admitting: Oncology

## 2020-08-29 ENCOUNTER — Inpatient Hospital Stay: Payer: Medicare Other

## 2020-08-29 ENCOUNTER — Encounter: Payer: Self-pay | Admitting: Oncology

## 2020-08-30 DIAGNOSIS — M533 Sacrococcygeal disorders, not elsewhere classified: Secondary | ICD-10-CM | POA: Diagnosis not present

## 2020-08-30 DIAGNOSIS — M542 Cervicalgia: Secondary | ICD-10-CM | POA: Diagnosis not present

## 2020-09-13 ENCOUNTER — Other Ambulatory Visit: Payer: Self-pay | Admitting: Oncology

## 2020-09-19 DIAGNOSIS — M0609 Rheumatoid arthritis without rheumatoid factor, multiple sites: Secondary | ICD-10-CM | POA: Diagnosis not present

## 2020-09-19 DIAGNOSIS — Z791 Long term (current) use of non-steroidal anti-inflammatories (NSAID): Secondary | ICD-10-CM | POA: Diagnosis not present

## 2020-09-19 DIAGNOSIS — M8949 Other hypertrophic osteoarthropathy, multiple sites: Secondary | ICD-10-CM | POA: Diagnosis not present

## 2020-10-27 DIAGNOSIS — M48062 Spinal stenosis, lumbar region with neurogenic claudication: Secondary | ICD-10-CM | POA: Diagnosis not present

## 2020-10-27 DIAGNOSIS — M5416 Radiculopathy, lumbar region: Secondary | ICD-10-CM | POA: Diagnosis not present

## 2020-11-07 NOTE — Progress Notes (Signed)
Kiara Brown  [phone] Fax:([phone]) [phone]     ID: TARIAH TRANSUE DOB: 01-Oct-1941  MR#: [id number]  LID#:[id number]  Patient Care Team: Burman Freestone, MD as PCP - General (Internal Medicine) Excell Seltzer, MD (Inactive) as Consulting Physician (General Surgery) Maggy Wyble, Virgie Dad, MD as Consulting Physician (Oncology) Gery Pray, MD as Consulting Physician (Radiation Oncology) Nevada Crane, MD as Referring Physician (Internal Medicine) Bensimhon, Shaune Pascal, MD as Consulting Physician (Cardiology) Ulyses Southward., MD as Consulting Physician (Urology) Posey Pronto, Janus Jonelle Sidle, MD as Consulting Physician (Pain Medicine) OTHER MD:  CHIEF COMPLAINT: 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  She continues on anastrozole with good tolerance.  She has occasional hot flashes.  She does have significant issues with vaginal dryness.  Her most recent done density testing was performed on 06/13/2018. This showed a T-score of -0.5, which is considered normal.  Since her last visit, she underwent cervical spine MRI on 3/22/20921 at [facility]. This showed: multilevel spondylosis with resultant mild to moderate spinal stenosis at C3-4 through C6-7.  She was referred to Dr. Posey Pronto in pain management on 03/07/2020.  She has received some pain "shots" which she says have been very helpful.  She is also being followed by Dr. Venia Minks at [facility] for recurrent UTIs.   REVIEW OF SYSTEMS: Kiara Brown had Thanksgiving's catered at her house and had a good time with her family.  She is not exercising regularly: She likes to go swimming but has not been doing that for the past several months.  Detailed review of systems was otherwise stable   COVID 19 VACCINATION STATUS: Status post Pfizer x2+ booster September  2021   BREAST CANCER HISTORY: From the original intake note:  "Kiara Brown" herself noted a change around her nipple on the right and brought it to medical attention. She was set up for bilateral diagnostic mammography with tomography and right breast ultrasonography at [facility] 05/23/2017. The breast density was category C. In the right breast upper inner quadrant there was a 2.5 cm area of grouped pleomorphic calcifications correlating with the palpable mass. Also there was a 1.2 cm macrolobulated mass in the upper inner quadrant also in the palpable region. These masses were identifiable by ultrasound with the same dimensions. The right axilla was sonographically negative.  On 05/27/2017 the patient underwent biopsy of the 2 masses in question. 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  The patient's subsequent history is as detailed below.   PAST MEDICAL HISTORY: Past Medical History:  Diagnosis Date  . Arthritis    osteo, rheumatoid  . Breast cancer ([place])   . Depression 02/22/2020  . Diverticulosis   . Dysrhythmia    hx rapid heartbeat, no cardiologist  . Family history of breast cancer   . Family history of prostate cancer   . Fibromyalgia    peripheral neuropathy  . Fibromyalgia   . GERD (gastroesophageal reflux disease)   . H/O hiatal hernia   . Hyperlipidemia   .  PONV (postoperative nausea and vomiting)    "THROAT WITH EXTREME DGUYQIH"4742, ulnar nerve surgery  . Sciatica   . Shortness of breath dyspnea    with exertion  . UTI (lower urinary tract infection)     PAST SURGICAL  HISTORY: Past Surgical History:  Procedure Laterality Date  . ABDOMINAL HYSTERECTOMY    . APPENDECTOMY    . back injection      July 2018- x1  . BREAST SURGERY Left 1986   biopsy, Recent 05/2017 at [facility]- had biopsy on R breast- malignant   . CARDIAC CATHETERIZATION  2009   "no blockage per pt"  . CHOLECYSTECTOMY    . CYSTOSCOPY WITH BIOPSY    . INCONTINENCE SURGERY    . IRRIGATION AND DEBRIDEMENT ABSCESS Right 01/28/2018   Procedure: IRRIGATION AND DEBRIDEMENT AXILLARY ABSCESS;  Surgeon: Excell Seltzer, MD;  Location: [facility];  Service: General;  Laterality: Right;  . JOINT REPLACEMENT    . KNEE ARTHROSCOPY     bilateral  . KNEE ARTHROSCOPY Left 03/17/2014   Procedure: ARTHROSCOPY LEFT KNEE WITH SYNOVECTOMY;  Surgeon: Gearlean Alf, MD;  Location: [facility];  Service: Orthopedics;  Laterality: Left;  . PORTACATH PLACEMENT Left 07/02/2017   Procedure: INSERTION PORT-A-CATH;  Surgeon: Excell Seltzer, MD;  Location: [place];  Service: General;  Laterality: Left;  . RIGHT/LEFT HEART CATH AND CORONARY ANGIOGRAPHY N/A 08/30/2017   Procedure: RIGHT/LEFT HEART CATH AND CORONARY ANGIOGRAPHY;  Surgeon: Larey Dresser, MD;  Location: [place] [facility];  Service: Cardiovascular;  Laterality: N/A;  . ROTATOR CUFF REPAIR     right  . SIMPLE MASTECTOMY WITH AXILLARY SENTINEL NODE BIOPSY Right 07/02/2017   Procedure: RIGHT TOTAL  MASTECTOMY WITH AXILLARY SENTINEL LYMPH  NODE BIOPSY;  Surgeon: Excell Seltzer, MD;  Location: [place];  Service: General;  Laterality: Right;  . TOTAL KNEE ARTHROPLASTY  08/04/2012   Procedure: TOTAL KNEE ARTHROPLASTY;  Surgeon: Gearlean Alf, MD;  Location: [facility];  Service: Orthopedics;  Laterality: Left;  . TOTAL KNEE ARTHROPLASTY Right 04/11/2015   Procedure: RIGHT TOTAL KNEE ARTHROPLASTY;  Surgeon: Gaynelle Arabian, MD;  Location: [facility];  Service: Orthopedics;  Laterality: Right;  . ulnar nerve surgery Right 1986    FAMILY HISTORY Family History  Problem Relation  Age of Onset  . Hypertension Mother   . Stroke Mother   . Breast cancer Mother 35       died at 69  . Hypertension Father   . Heart attack Father        died at 16  . Hypertension Brother        MI  . Prostate cancer Brother 68  . Hypertension Brother        MI  . Lung cancer Brother        possible lung cancer, spot found on lung being monitored  . Hypertension Brother        MI  . Non-Hodgkin's lymphoma Sister 44  . Breast cancer Other 29       is currently 90  . Breast cancer Other 55  . Cancer Cousin 62       type of cancer unk.   . Cancer Other        type unknown, eye/face   . Cancer Other 84       died from cancer in his 77's, type unknown   The patient's father died from a heart attack at age 22. The patient's mother was diagnosed with breast cancer at  age 55 and died at age 92. The patient had 5 brothers, 3 sisters. One sister had breast cancer at age 34. One brother was diagnosed with prostate cancer at age 69. One Brother died from lung cancer at age 58. One sister had non-Hodgkin's lymphoma diagnosed at age 79.    GYNECOLOGIC HISTORY:  No LMP recorded. Patient has had a hysterectomy.  menarche age 89, first live birth age 58. All 3 of her births were premature including 1 set of twins. One of the twins died a few hours after birth. She underwent hysterectomy in 1981 with unilateral salpingo-oophorectomy. 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  She has led groups as far away as [place] and [place] and has led many in the [place] cruises as well.  Her husband, Berneta Sages began a business installing fences which is now carried on by his son Dorothea Ogle. Berneta Sages is also the church organist. Son, Darnelle Maffucci is a truck Geophysicist/field seismologist in [employer]. The patient has one grandchild. She is a Tourist information centre manager.     ADVANCED DIRECTIVES: In place. The patient's husband is her  healthcare power of attorney   HEALTH MAINTENANCE: Social History   Tobacco Use  . Smoking status: Never Smoker  . Smokeless tobacco: Never Used  Substance Use Topics  . Alcohol use: No  . Drug use: No     Colonoscopy:2017 / [place]   PAP:  Bone density: 2019/ [place] normal   Allergies  Allergen Reactions  . Gabapentin Anxiety  . Pyridium [Phenazopyridine Hcl]     Nausea  Vomiting diarrhea  . Atorvastatin Other (See Comments)    myalgias   . Duloxetine Other (See Comments)    UNSPECIFIED REACTION   . Nsaids Rash  . Oseltamivir Diarrhea and Other (See Comments)    Abdominal pain     Current Outpatient Medications  Medication Sig Dispense Refill  . acetaminophen (TYLENOL) 500 MG tablet Take 500 mg by mouth every 6 (six) hours as needed for moderate pain or headache.     . anastrozole (ARIMIDEX) 1 MG tablet Take 1 tablet (1 mg total) by mouth daily. 90 tablet 4  . aspirin EC 81 MG tablet Take 81 mg by mouth daily after breakfast.     . atenolol (TENORMIN) 50 MG tablet Take 50 mg by mouth daily before breakfast.     . buPROPion (WELLBUTRIN XL) 150 MG 24 hr tablet Take 150 mg by mouth daily before breakfast.    . ferrous sulfate 325 (65 FE) MG tablet Take 1 tablet (325 mg total) by mouth 2 (two) times daily with a meal. (Patient taking differently: Take 325 mg by mouth daily after breakfast. ) 60 tablet 0  . folic acid (FOLVITE) 1 MG tablet Take 1 mg by mouth daily after breakfast.     . glipiZIDE (GLUCOTROL XL) 5 MG 24 hr tablet Take 5 mg by mouth daily with breakfast.     . omeprazole (PRILOSEC) 40 MG capsule Take 40 mg by mouth every morning.    Marland Kitchen oxybutynin (DITROPAN) 5 MG tablet Take 5 mg by mouth in the morning, at noon, and at bedtime.    . rosuvastatin (CRESTOR) 5 MG tablet Take 1 tablet (5 mg total) by mouth every other day. 45 tablet 3  . traMADol (ULTRAM) 50 MG tablet Take 50 mg by mouth every 6 (six) hours as needed for moderate  pain or severe pain.     .  traZODone (DESYREL) 50 MG tablet Take 50 mg by mouth at bedtime.      No current facility-administered medications for this visit.    OBJECTIVE: White woman who appears stated age  47:   11/08/20 1416  BP: (!) 143/53  Pulse: 63  Resp: 17  Temp: 98.1 F (36.7 C)  SpO2: 98%     Body mass index is 43.75 kg/m.   Wt Readings from Last 3 Encounters:  11/08/20 247 lb (112 kg)  02/21/20 250 lb (113.4 kg)  08/27/19 251 lb 9.6 oz (114.1 kg)   Sclerae unicteric, EOMs intact Wearing a mask No cervical or supraclavicular adenopathy Lungs no rales or rhonchi Heart regular rate and rhythm Abd soft, nontender, positive bowel sounds MSK no focal spinal tenderness, no upper extremity lymphedema Neuro: nonfocal, well oriented, appropriate affect Breasts: Status post right mastectomy.  There is no evidence of local recurrence.  Left breast and both axillae are benign   LAB RESULTS:  CMP     Component Value Date/Time   NA 136 11/08/2020 1347   NA 131 (L) 09/06/2017 1018   K 4.0 11/08/2020 1347   K 4.2 09/06/2017 1018   CL 102 11/08/2020 1347   CO2 27 11/08/2020 1347   CO2 25 09/06/2017 1018   GLUCOSE 146 (H) 11/08/2020 1347   GLUCOSE 187 (H) 09/06/2017 1018   BUN 16 11/08/2020 1347   BUN 11.2 09/06/2017 1018   CREATININE 1.06 (H) 11/08/2020 1347   CREATININE 1.04 02/21/2018 1139   CREATININE 1.2 (H) 09/06/2017 1018   CALCIUM 9.2 11/08/2020 1347   CALCIUM 9.3 09/06/2017 1018   PROT 7.1 11/08/2020 1347   PROT 6.9 09/06/2017 1018   ALBUMIN 3.4 (L) 11/08/2020 1347   ALBUMIN 3.4 (L) 09/06/2017 1018   AST 12 (L) 11/08/2020 1347   AST 20 09/06/2017 1018   ALT 13 11/08/2020 1347   ALT 14 09/06/2017 1018   ALKPHOS 116 11/08/2020 1347   ALKPHOS 103 09/06/2017 1018   BILITOT 0.3 11/08/2020 1347   BILITOT 0.57 09/06/2017 1018   GFRNONAA 53 (L) 11/08/2020 1347   GFRNONAA 51 (L) 02/21/2018 1139   GFRAA >60 02/24/2020 0536   GFRAA 59 (L) 02/21/2018 1139    No results found  for: TOTALPROTELP, ALBUMINELP, A1GS, A2GS, BETS, BETA2SER, GAMS, MSPIKE, SPEI  No results found for: Nils Pyle, KAPLAMBRATIO  Lab Results  Component Value Date   WBC 8.5 11/08/2020   NEUTROABS 5.3 11/08/2020   HGB 10.1 (L) 11/08/2020   HCT 31.4 (L) 11/08/2020   MCV 80.7 11/08/2020   PLT 326 11/08/2020      Chemistry      Component Value Date/Time   NA 136 11/08/2020 1347   NA 131 (L) 09/06/2017 1018   K 4.0 11/08/2020 1347   K 4.2 09/06/2017 1018   CL 102 11/08/2020 1347   CO2 27 11/08/2020 1347   CO2 25 09/06/2017 1018   BUN 16 11/08/2020 1347   BUN 11.2 09/06/2017 1018   CREATININE 1.06 (H) 11/08/2020 1347   CREATININE 1.04 02/21/2018 1139   CREATININE 1.2 (H) 09/06/2017 1018      Component Value Date/Time   CALCIUM 9.2 11/08/2020 1347   CALCIUM 9.3 09/06/2017 1018   ALKPHOS 116 11/08/2020 1347   ALKPHOS 103 09/06/2017 1018   AST 12 (L) 11/08/2020 1347   AST 20 09/06/2017 1018   ALT 13 11/08/2020 1347   ALT 14 09/06/2017 1018  BILITOT 0.3 11/08/2020 1347   BILITOT 0.57 09/06/2017 1018       No results found for: LABCA2  No components found for: VHQION629  No results for input(s): INR in the last 168 hours.  Urinalysis    Component Value Date/Time   COLORURINE AMBER (A) 02/21/2020 1755   APPEARANCEUR CLEAR 02/21/2020 1755   LABSPEC 1.014 02/21/2020 1755   PHURINE 5.0 02/21/2020 1755   GLUCOSEU NEGATIVE 02/21/2020 1755   HGBUR MODERATE (A) 02/21/2020 1755   BILIRUBINUR NEGATIVE 02/21/2020 1755   KETONESUR NEGATIVE 02/21/2020 1755   PROTEINUR NEGATIVE 02/21/2020 1755   UROBILINOGEN 0.2 04/06/2015 1332   NITRITE POSITIVE (A) 02/21/2020 1755   LEUKOCYTESUR MODERATE (A) 02/21/2020 1755    STUDIES: No results found.   ELIGIBLE FOR AVAILABLE RESEARCH PROTOCOL: No  ASSESSMENT: 79 y.o. [place] woman status post right breast upper inner quadrant biopsy 2 on 05/27/2017 showing a clinical T1c N0, stage IA invasive ductal carcinoma,  grade 3, estrogen and progesterone receptor positive, HER-2 amplified, with an MIB-1 of 60%  (1) genetics testing 07/14/2017 through the Multi-Cancer panel + Breast Cancer Panel found no deleterious mutations in  ALK, APC,AKT1, ATM, AXIN2,BAP1,  BARD1, BLM, BMPR1A, BRCA1, BRCA2, BRIP1, CASR, CDC73, CDH1, CDK4, CDKN1B, CDKN1C, CDKN2A (p14ARF), CDKN2A (p16INK4a), CEBPA, CHEK2, CTNNA1, DICER1, DIS3L2, EGFR (c.2369C>T, p.Thr790Met variant only), EPCAM (Deletion/duplication testing only), FH, FAM175A, FLCN, GATA2, GPC3, GREM1 (Promoter region deletion/duplication testing only), HOXB13 (c.251G>A, p.Gly84Glu), HRAS, KIT, MAX, MEN1, MET, MITF (c.952G>A, p.Glu318Lys variant only), MLH1, MSH2, MSH3, MSH6, MUTYH, NBN, NF1, NF2, NTHL1, PALB2, PDGFRA, PHOX2B, PMS2, POLD1, POLE, POT1, PRKAR1A, PTCH1, PTEN, RAD50, RAD51C, RAD51D, RB1, RECQL4, RET, RUNX1, SDHAF2, SDHA (sequence changes only), SDHB, SDHC, SDHD, SMAD4, SMARCA4, SMARCB1, SMARCE1, STK11, SUFU, TERC, TERT, TMEM127, TP53, TSC1, TSC2, VHL, WRN, WT1, FANCC,MRE11,PIK3CA,RINT1,XRCC2.  (a)  3 Variants of Uncertain significance (VUS) were identified.  BRCA1 c.2551G>A (p.Glu851Lys)----CHEK2 c.679G>A (p.Gly227Arg).  The CHECK 2 variant has been reported as possibly mosaic--- SDHA c.704T>C (p.Ile235Thr).  (2) anastrozole started 06/05/2017, to be continued and minimum of 5 years   (a) bone density 06/03/2018 showed a T score of -0.5 (normal).  (3) status post right mastectomy and sentinel lymph node sampling 07/02/2017 for an mpT2 pN0, stage IB invasive ductal carcinoma, grade 3, with negative margins.  (a) the patient opted against reconstruction  (4) planned trastuzumab for 6 months started 08/09/2017, discontinued after one dose with significant reaction  (a) baseline echocardiogram 06/19/2017 shows an excellent ejection fraction  (b) Echocardiogram 09/16/2017 showed an ejection fraction of 60-65%  (5) started lapatinib 12/02/2017-- continued 6 months  (completed July 2019)  (a) echocardiogram 01/21/2018 shows an ejection fraction in the 60-65%.  (b) echocardiogram 06/02/2018 showed an ejection fraction in the 55-60% range   PLAN: Kiara Brown is now a little over 3 years out from definitive surgery for her breast cancer with no evidence of disease recurrence.  This is very favorable.  She is tolerating anastrozole well and the plan will be to continue that a minimum of 5 years.  I have encouraged her to return to her swimming routine, which she was enjoying previously.  Otherwise she will return to see me in about a year.  She knows to call for any other issue that may develop before the next visit  Total encounter time 20 minutes.   Harlean Regula, Virgie Dad, MD  11/08/20 5:27 PM Medical Oncology and Hematology [facility] [place], [place] [address] Tel. [phone]    Fax. [phone]  I, Wilburn Mylar, am acting as scribe for Dr. Sarajane Jews C. Rilea Arutyunyan.  I, Lurline Del MD, have reviewed the above documentation for accuracy and completeness, and I agree with the above.   *Total Encounter Time as defined by the Centers for Medicare and Medicaid Services includes, in addition to the face-to-face time of a patient visit (documented in the note above) non-face-to-face time: obtaining and reviewing outside history, ordering and reviewing medications, tests or procedures, care coordination (communications with other health care professionals or caregivers) and documentation in the medical record.

## 2020-11-08 ENCOUNTER — Telehealth: Payer: Self-pay | Admitting: Oncology

## 2020-11-08 ENCOUNTER — Inpatient Hospital Stay (HOSPITAL_BASED_OUTPATIENT_CLINIC_OR_DEPARTMENT_OTHER): Payer: Medicare Other | Admitting: Oncology

## 2020-11-08 ENCOUNTER — Inpatient Hospital Stay: Payer: Medicare Other | Attending: Oncology

## 2020-11-08 ENCOUNTER — Other Ambulatory Visit: Payer: Self-pay

## 2020-11-08 VITALS — BP 143/53 | HR 63 | Temp 98.1°F | Resp 17 | Ht 63.0 in | Wt 247.0 lb

## 2020-11-08 DIAGNOSIS — Z79811 Long term (current) use of aromatase inhibitors: Secondary | ICD-10-CM | POA: Diagnosis not present

## 2020-11-08 DIAGNOSIS — C50211 Malignant neoplasm of upper-inner quadrant of right female breast: Secondary | ICD-10-CM | POA: Insufficient documentation

## 2020-11-08 DIAGNOSIS — Z8349 Family history of other endocrine, nutritional and metabolic diseases: Secondary | ICD-10-CM | POA: Insufficient documentation

## 2020-11-08 DIAGNOSIS — Z7982 Long term (current) use of aspirin: Secondary | ICD-10-CM | POA: Diagnosis not present

## 2020-11-08 DIAGNOSIS — Z7984 Long term (current) use of oral hypoglycemic drugs: Secondary | ICD-10-CM | POA: Diagnosis not present

## 2020-11-08 DIAGNOSIS — Z807 Family history of other malignant neoplasms of lymphoid, hematopoietic and related tissues: Secondary | ICD-10-CM | POA: Insufficient documentation

## 2020-11-08 DIAGNOSIS — Z170 Estrogen receptor positive status [ER+]: Secondary | ICD-10-CM | POA: Insufficient documentation

## 2020-11-08 DIAGNOSIS — Z803 Family history of malignant neoplasm of breast: Secondary | ICD-10-CM | POA: Diagnosis not present

## 2020-11-08 DIAGNOSIS — Z79899 Other long term (current) drug therapy: Secondary | ICD-10-CM | POA: Diagnosis not present

## 2020-11-08 DIAGNOSIS — Z8249 Family history of ischemic heart disease and other diseases of the circulatory system: Secondary | ICD-10-CM | POA: Insufficient documentation

## 2020-11-08 DIAGNOSIS — Z8379 Family history of other diseases of the digestive system: Secondary | ICD-10-CM | POA: Insufficient documentation

## 2020-11-08 DIAGNOSIS — F32A Depression, unspecified: Secondary | ICD-10-CM | POA: Diagnosis not present

## 2020-11-08 DIAGNOSIS — Z801 Family history of malignant neoplasm of trachea, bronchus and lung: Secondary | ICD-10-CM | POA: Diagnosis not present

## 2020-11-08 DIAGNOSIS — Z8042 Family history of malignant neoplasm of prostate: Secondary | ICD-10-CM | POA: Diagnosis not present

## 2020-11-08 DIAGNOSIS — Z90710 Acquired absence of both cervix and uterus: Secondary | ICD-10-CM | POA: Insufficient documentation

## 2020-11-08 DIAGNOSIS — C50911 Malignant neoplasm of unspecified site of right female breast: Secondary | ICD-10-CM

## 2020-11-08 LAB — CBC WITH DIFFERENTIAL/PLATELET
Abs Immature Granulocytes: 0.03 10*3/uL (ref 0.00–0.07)
Basophils Absolute: 0 10*3/uL (ref 0.0–0.1)
Basophils Relative: 1 %
Eosinophils Absolute: 0.1 10*3/uL (ref 0.0–0.5)
Eosinophils Relative: 1 %
HCT: 31.4 % — ABNORMAL LOW (ref 36.0–46.0)
Hemoglobin: 10.1 g/dL — ABNORMAL LOW (ref 12.0–15.0)
Immature Granulocytes: 0 %
Lymphocytes Relative: 28 %
Lymphs Abs: 2.3 10*3/uL (ref 0.7–4.0)
MCH: 26 pg (ref 26.0–34.0)
MCHC: 32.2 g/dL (ref 30.0–36.0)
MCV: 80.7 fL (ref 80.0–100.0)
Monocytes Absolute: 0.7 10*3/uL (ref 0.1–1.0)
Monocytes Relative: 8 %
Neutro Abs: 5.3 10*3/uL (ref 1.7–7.7)
Neutrophils Relative %: 62 %
Platelets: 326 10*3/uL (ref 150–400)
RBC: 3.89 MIL/uL (ref 3.87–5.11)
RDW: 15.9 % — ABNORMAL HIGH (ref 11.5–15.5)
WBC: 8.5 10*3/uL (ref 4.0–10.5)
nRBC: 0 % (ref 0.0–0.2)

## 2020-11-08 LAB — COMPREHENSIVE METABOLIC PANEL
ALT: 13 U/L (ref 0–44)
AST: 12 U/L — ABNORMAL LOW (ref 15–41)
Albumin: 3.4 g/dL — ABNORMAL LOW (ref 3.5–5.0)
Alkaline Phosphatase: 116 U/L (ref 38–126)
Anion gap: 7 (ref 5–15)
BUN: 16 mg/dL (ref 8–23)
CO2: 27 mmol/L (ref 22–32)
Calcium: 9.2 mg/dL (ref 8.9–10.3)
Chloride: 102 mmol/L (ref 98–111)
Creatinine, Ser: 1.06 mg/dL — ABNORMAL HIGH (ref 0.44–1.00)
GFR, Estimated: 53 mL/min — ABNORMAL LOW (ref >60)
Glucose, Bld: 146 mg/dL — ABNORMAL HIGH (ref 70–99)
Potassium: 4 mmol/L (ref 3.5–5.1)
Sodium: 136 mmol/L (ref 135–145)
Total Bilirubin: 0.3 mg/dL (ref 0.3–1.2)
Total Protein: 7.1 g/dL (ref 6.5–8.1)

## 2020-11-08 MED ORDER — ANASTROZOLE 1 MG PO TABS: 1.0000 mg | ORAL_TABLET | Freq: Every day | ORAL | 4 refills | Status: DC

## 2020-11-08 NOTE — Telephone Encounter (Signed)
Scheduled appts per 12/14 los. Gave pt a print out of AVS.  

## 2020-11-23 DIAGNOSIS — M48062 Spinal stenosis, lumbar region with neurogenic claudication: Secondary | ICD-10-CM | POA: Diagnosis not present

## 2020-12-09 DIAGNOSIS — R3129 Other microscopic hematuria: Secondary | ICD-10-CM | POA: Diagnosis not present

## 2020-12-09 DIAGNOSIS — R399 Unspecified symptoms and signs involving the genitourinary system: Secondary | ICD-10-CM | POA: Diagnosis not present

## 2020-12-09 DIAGNOSIS — R8290 Unspecified abnormal findings in urine: Secondary | ICD-10-CM | POA: Diagnosis not present

## 2020-12-30 DIAGNOSIS — R300 Dysuria: Secondary | ICD-10-CM | POA: Diagnosis not present

## 2020-12-30 DIAGNOSIS — Z87440 Personal history of urinary (tract) infections: Secondary | ICD-10-CM | POA: Diagnosis not present

## 2021-01-23 DIAGNOSIS — D649 Anemia, unspecified: Secondary | ICD-10-CM | POA: Diagnosis not present

## 2021-01-23 DIAGNOSIS — Z791 Long term (current) use of non-steroidal anti-inflammatories (NSAID): Secondary | ICD-10-CM | POA: Diagnosis not present

## 2021-01-23 DIAGNOSIS — M8949 Other hypertrophic osteoarthropathy, multiple sites: Secondary | ICD-10-CM | POA: Diagnosis not present

## 2021-02-14 ENCOUNTER — Encounter (INDEPENDENT_AMBULATORY_CARE_PROVIDER_SITE_OTHER): Payer: Self-pay | Admitting: Family Medicine

## 2021-02-14 ENCOUNTER — Other Ambulatory Visit (INDEPENDENT_AMBULATORY_CARE_PROVIDER_SITE_OTHER): Payer: Self-pay | Admitting: Family Medicine

## 2021-02-14 ENCOUNTER — Ambulatory Visit (INDEPENDENT_AMBULATORY_CARE_PROVIDER_SITE_OTHER): Payer: Medicare Other | Admitting: Family Medicine

## 2021-02-14 ENCOUNTER — Other Ambulatory Visit: Payer: Self-pay

## 2021-02-14 VITALS — BP 151/72 | HR 62 | Temp 97.4°F | Ht 62.0 in | Wt 243.0 lb

## 2021-02-14 DIAGNOSIS — R0683 Snoring: Secondary | ICD-10-CM

## 2021-02-14 DIAGNOSIS — Z853 Personal history of malignant neoplasm of breast: Secondary | ICD-10-CM | POA: Diagnosis not present

## 2021-02-14 DIAGNOSIS — R0602 Shortness of breath: Secondary | ICD-10-CM

## 2021-02-14 DIAGNOSIS — Z79899 Other long term (current) drug therapy: Secondary | ICD-10-CM | POA: Diagnosis not present

## 2021-02-14 DIAGNOSIS — F3289 Other specified depressive episodes: Secondary | ICD-10-CM

## 2021-02-14 DIAGNOSIS — E1169 Type 2 diabetes mellitus with other specified complication: Secondary | ICD-10-CM | POA: Diagnosis not present

## 2021-02-14 DIAGNOSIS — Z6841 Body Mass Index (BMI) 40.0 and over, adult: Secondary | ICD-10-CM | POA: Diagnosis not present

## 2021-02-14 DIAGNOSIS — D539 Nutritional anemia, unspecified: Secondary | ICD-10-CM

## 2021-02-14 DIAGNOSIS — E7849 Other hyperlipidemia: Secondary | ICD-10-CM

## 2021-02-14 DIAGNOSIS — R5383 Other fatigue: Secondary | ICD-10-CM | POA: Diagnosis not present

## 2021-02-14 DIAGNOSIS — N390 Urinary tract infection, site not specified: Secondary | ICD-10-CM

## 2021-02-14 DIAGNOSIS — E65 Localized adiposity: Secondary | ICD-10-CM

## 2021-02-14 DIAGNOSIS — G8929 Other chronic pain: Secondary | ICD-10-CM | POA: Diagnosis not present

## 2021-02-14 DIAGNOSIS — Z0289 Encounter for other administrative examinations: Secondary | ICD-10-CM

## 2021-02-14 DIAGNOSIS — E559 Vitamin D deficiency, unspecified: Secondary | ICD-10-CM | POA: Diagnosis not present

## 2021-02-15 LAB — HEMOGLOBIN A1C
Est. average glucose Bld gHb Est-mCnc: 151 mg/dL
Hgb A1c MFr Bld: 6.9 % — ABNORMAL HIGH (ref 4.8–5.6)

## 2021-02-15 LAB — T4, FREE: Free T4: 1.25 ng/dL (ref 0.82–1.77)

## 2021-02-15 LAB — LIPID PANEL
Chol/HDL Ratio: 3.6 ratio (ref 0.0–4.4)
Cholesterol, Total: 156 mg/dL (ref 100–199)
HDL: 43 mg/dL (ref >39)
LDL Chol Calc (NIH): 91 mg/dL (ref 0–99)
Triglycerides: 121 mg/dL (ref 0–149)
VLDL Cholesterol Cal: 22 mg/dL (ref 5–40)

## 2021-02-15 LAB — VITAMIN D 25 HYDROXY (VIT D DEFICIENCY, FRACTURES): Vit D, 25-Hydroxy: 9.7 ng/mL — ABNORMAL LOW (ref 30.0–100.0)

## 2021-02-15 LAB — VITAMIN B12: Vitamin B-12: 281 pg/mL (ref 232–1245)

## 2021-02-15 LAB — TSH: TSH: 3.43 u[IU]/mL (ref 0.450–4.500)

## 2021-02-22 NOTE — Progress Notes (Signed)
Chief Complaint:   Kiara Brown (MR# [id number]) is a 80 y.o. female who presents for evaluation and treatment of Kiara and related comorbidities. Current BMI is Body mass index is 44.45 kg/m. Kiara Brown has been struggling with her weight for many years and has been unsuccessful in either losing weight, maintaining weight loss, or reaching her healthy weight goal.  Kiara Brown is currently in the action stage of change and ready to dedicate time achieving and maintaining a healthier weight. Kiara Brown is interested in becoming our patient and working on intensive lifestyle modifications including (but not limited to) diet and exercise for weight loss.  Kiara Brown had a previous weight loss with Weight Watchers of 20-30 pounds and with Optifast of 65 pounds.  She has regained.  She says that standing too long with cooking causes pain.  She knows she needs to eat more vegetables.  She has a shellfish allergy and an intolerance to artificial sweeteners.  Kiara Brown's habits were reviewed today and are as follows: her desired weight loss is 55 pounds, she has been heavy most of her life, she started gaining weight during her first pregnancy, her heaviest weight ever was 260 pounds, she is a picky eater and doesn't like to eat healthier foods, she craves sweets, she snacks frequently in the evenings, she is frequently drinking liquids with calories, she frequently makes poor food choices and she struggles with emotional eating.  Kiara Brown provided the following food recall today:  Breakfast:  Eggs, bacon, toast. Lunch:  Banana sandwich with peanut butter. Dinner:  Sweet & Sour Chicken ([employer] frozen meal). Water:  Good intake - always drinking.  She eats out often.  Sometimes skips meals.  Needs to eat smaller portions.  Depression Screen Kiara Brown's Food and Mood (modified PHQ-9) score was 4.  Depression screen Kiara Brown 2/9 02/14/2021  Decreased Interest 1  Down, Depressed, Hopeless 0  PHQ -  2 Score 1  Altered sleeping 0  Tired, decreased energy 1  Change in appetite 1  Feeling bad or failure about yourself  0  Trouble concentrating 0  Moving slowly or fidgety/restless 1  Suicidal thoughts 0  PHQ-9 Score 4  Difficult doing work/chores Somewhat difficult   Assessment/Plan:   1. Other fatigue Kiara Brown admits to daytime somnolence and reports waking up still tired. Patent has a history of symptoms of daytime fatigue, morning fatigue and snoring. Kiara Brown generally gets 2-6 hours of sleep per night, and states that she has poor quality sleep. Snoring is present. Apneic episodes are not present. Epworth Sleepiness Score is 9.  Kiara Brown does feel that her weight is causing her energy to be lower than it should be. Fatigue may be related to Kiara, depression or many other causes. Labs will be ordered, and in the meanwhile, Kiara Brown will focus on self care including making healthy food choices, increasing physical activity and focusing on stress reduction.  - EKG 12-Lead - TSH - T4, free  2. SOB (shortness of breath) on exertion Kiara Brown notes increasing shortness of breath with exercising and seems to be worsening over time with weight gain. She notes getting out of breath sooner with activity than she used to. This has gotten worse recently. Kiara Brown denies shortness of breath at rest or orthopnea.  Kiara Brown does feel that she gets out of breath more easily that she used to when she exercises. Kiara Brown's shortness of breath appears to be Kiara related and exercise induced. She has agreed to work on weight loss and  gradually increase exercise to treat her exercise induced shortness of breath. Will continue to monitor closely.  3. Visceral Kiara Current visceral fat rating: 21. Visceral fat rating should be < 13. Visceral adipose tissue is a hormonally active component of total body fat. This body composition phenotype is associated with medical disorders such as metabolic syndrome,  cardiovascular disease and several malignancies including prostate, breast, and colorectal cancers. Starting goal: Lose 7-10% of starting weight.   4. Other chronic pain Fibromyalgia, RLS, DDD cervical/lumbar.  Spinal stenosis, lumbar, with neurogenic claudication.  5. Type 2 diabetes mellitus with other specified complication, without long-term current use of insulin (HCC) Diabetes Mellitus: Not at goal.  With CKD.  GFR 59.  Medication: glipizide XL 5 mg daily. Issues reviewed: blood sugar goals, complications of diabetes mellitus, hypoglycemia prevention and treatment, exercise, and nutrition.   Plan: The importance of regular follow up with PCP and all other specialists as scheduled was stressed to patient today.  Will check A1c today.  Lab Results  Component Value Date   HGBA1C 7.0 (H) 02/22/2020   HGBA1C 8 07/02/2017   Lab Results  Component Value Date   CREATININE 1.06 (H) 11/08/2020   - Hemoglobin A1c  6. History of breast cancer, ER+ Right breast mastectomy in 2018.  She is taking anastrozole 50 mg daily.  Plan:  Continue anastrozole.  7. Snores Kiara Brown endorses snoring.  Epworth Sleepiness Score is 9.  8. Frequent UTI Taking Macrodantin 50 mg daily.  9. Nutritional anemia, unspecified Kiara Brown is taking ferrous sulfate 325 mg twice daily.  Ferritin level 21 (see Care Everywhere).  Plan:  Check B12 level today.  Continue ferrous sulfate.  ?iron infusion.  - Vitamin B12  10. Other hyperlipidemia Lipid-lowering medications: None.   Plan: Dietary changes: Increase soluble fiber, decrease simple carbohydrates, decrease saturated fat. Exercise changes: Moderate to vigorous-intensity aerobic activity 150 minutes per week or as tolerated. We will continue to monitor along with PCP/specialists as it pertains to her weight loss journey.  Will check FLP today.  Lab Results  Component Value Date   ALT 13 11/08/2020   AST 12 (L) 11/08/2020   ALKPHOS 116 11/08/2020    BILITOT 0.3 11/08/2020   The 10-year ASCVD risk score Kiara Bussing DC Jr., Kiara al., Kiara Brown) is: 62.4%   Values used to calculate the score:     Age: 46 years     Sex: Female     Is Non-Hispanic African American: No     Diabetic: Yes     Tobacco smoker: No     Systolic Blood Pressure: 027 mmHg     Is BP treated: Yes     HDL Cholesterol: 43 mg/dL     Total Cholesterol: 156 mg/dL  - Lipid panel  11. Encounter for long-term (current) drug use Will check vitamin D level today, as per below.  - VITAMIN D 25 Hydroxy (Vit-D Deficiency, Fractures)  12. Other depression, with emotional eating Not at goal. Medication: Wellbutrin XL 150 mg daily.  Plan:  Continue Wellbutrin.  Behavior modification techniques were discussed today to help deal with emotional/non-hunger eating behaviors.  13. 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 I recommend Kiara Brown begin the structured treatment plan as follows:  She has agreed to the Category 2 Plan.  Exercise goals:  No exercise has been prescribed at this time.   Behavioral modification strategies: increasing lean protein intake, decreasing simple carbohydrates, increasing vegetables, increasing water intake, decreasing liquid calories, decreasing sodium intake and increasing high fiber foods.  She was informed of the importance of frequent follow-up visits to maximize her success with intensive lifestyle modifications for her multiple health conditions. She was informed we would discuss her lab results at her next visit unless there is a critical issue that needs to be addressed sooner. Kiara Brown agreed to keep her next visit at the agreed upon time to discuss these results.  Objective:   Blood pressure (!) 151/72, pulse 62, temperature (!) 97.4 F (36.3 C), temperature  source Oral, height 5\' 2"  (1.575 m), weight 243 lb (110.2 kg), SpO2 96 %. Body mass index is 44.45 kg/m.  EKG: Normal sinus rhythm, rate 64 bpm.  Indirect Calorimeter completed today shows a VO2 of 250 and a REE of 1740.  Her calculated basal metabolic rate is 0175 thus her basal metabolic rate is better than expected.  General: Cooperative, alert, well developed, in no acute distress. HEENT: Conjunctivae and lids unremarkable. Cardiovascular: Regular rhythm.  Lungs: Normal work of breathing. Neurologic: No focal deficits.   Lab Results  Component Value Date   CREATININE 1.06 (H) 11/08/2020   BUN 16 11/08/2020   NA 136 11/08/2020   K 4.0 11/08/2020   CL 102 11/08/2020   CO2 27 11/08/2020   Lab Results  Component Value Date   ALT 13 11/08/2020   AST 12 (L) 11/08/2020   ALKPHOS 116 11/08/2020   BILITOT 0.3 11/08/2020   Lab Results  Component Value Date   HGBA1C 7.0 (H) 02/22/2020   HGBA1C 8 07/02/2017   Lab Results  Component Value Date   WBC 8.5 11/08/2020   HGB 10.1 (L) 11/08/2020   HCT 31.4 (L) 11/08/2020   MCV 80.7 11/08/2020   PLT 326 11/08/2020   Lab Results  Component Value Date   IRON 41 08/26/2018   TIBC 325 08/26/2018   FERRITIN 22 08/26/2018   Kiara Behavioral Intervention:   Approximately 15 minutes were spent on the discussion below.  ASK: We discussed the diagnosis of Kiara with Vira Agar today and Danyah agreed to give [place] permission to discuss Kiara behavioral modification therapy today.  ASSESS: Chimene has the diagnosis of Kiara and her BMI today is 44.4. Jocilynn is in the action stage of change.   ADVISE: Camani was educated on the multiple health risks of Kiara as well as the benefit of weight loss to improve her health. She was advised of the need for long term treatment and the importance of lifestyle modifications to improve her current health and to decrease her risk of future health problems.  AGREE: Multiple dietary  modification options and treatment options were discussed and Stacye agreed to follow the recommendations documented in the above note.  ARRANGE: Tanieka was educated on the importance of frequent visits to treat Kiara as outlined per CMS and USPSTF guidelines and agreed to schedule her next follow up appointment today.  Attestation Statements:   This is the patient's first visit at [facility]. The patient's NEW PATIENT PACKET was reviewed at length. Included in the packet: current and past health history, medications, allergies, ROS, gynecologic history (women only), surgical history, family history, social history, weight history, weight loss surgery history (for those that have had weight loss surgery), nutritional evaluation, mood and food questionnaire, PHQ9, Epworth questionnaire, sleep habits questionnaire, patient  life and health improvement goals questionnaire. These will all be scanned into the patient's chart under media.   During the visit, I independently reviewed the patient's EKG, bioimpedance scale results, and indirect calorimeter results. I used this information to tailor a meal plan for the patient that will help her to lose weight and will improve her Kiara-related conditions going forward. I performed a medically necessary appropriate examination and/or evaluation. I discussed the assessment and treatment plan with the patient. The patient was provided an opportunity to ask questions and all were answered. The patient agreed with the plan and demonstrated an understanding of the instructions. Labs were ordered at this visit and will be reviewed at the next visit unless more critical results need to be addressed immediately. Clinical information was updated and documented in the EMR.   I, Water quality scientist, CMA, am acting as transcriptionist for Briscoe Deutscher, DO  I have reviewed the above documentation for accuracy and completeness, and I agree with the above. Briscoe Deutscher, DO

## 2021-02-28 ENCOUNTER — Encounter (INDEPENDENT_AMBULATORY_CARE_PROVIDER_SITE_OTHER): Payer: Self-pay | Admitting: Family Medicine

## 2021-02-28 ENCOUNTER — Ambulatory Visit (INDEPENDENT_AMBULATORY_CARE_PROVIDER_SITE_OTHER): Payer: Medicare Other | Admitting: Family Medicine

## 2021-02-28 ENCOUNTER — Other Ambulatory Visit: Payer: Self-pay

## 2021-02-28 VITALS — BP 147/66 | HR 62 | Temp 98.0°F | Ht 62.0 in | Wt 243.0 lb

## 2021-02-28 DIAGNOSIS — Z853 Personal history of malignant neoplasm of breast: Secondary | ICD-10-CM

## 2021-02-28 DIAGNOSIS — E65 Localized adiposity: Secondary | ICD-10-CM | POA: Diagnosis not present

## 2021-02-28 DIAGNOSIS — Z6841 Body Mass Index (BMI) 40.0 and over, adult: Secondary | ICD-10-CM | POA: Diagnosis not present

## 2021-02-28 DIAGNOSIS — E538 Deficiency of other specified B group vitamins: Secondary | ICD-10-CM | POA: Diagnosis not present

## 2021-02-28 DIAGNOSIS — E1169 Type 2 diabetes mellitus with other specified complication: Secondary | ICD-10-CM | POA: Diagnosis not present

## 2021-02-28 DIAGNOSIS — G894 Chronic pain syndrome: Secondary | ICD-10-CM | POA: Diagnosis not present

## 2021-02-28 DIAGNOSIS — E7849 Other hyperlipidemia: Secondary | ICD-10-CM | POA: Diagnosis not present

## 2021-02-28 DIAGNOSIS — E559 Vitamin D deficiency, unspecified: Secondary | ICD-10-CM

## 2021-02-28 MED ORDER — VITAMIN D (ERGOCALCIFEROL) 1.25 MG (50000 UNIT) PO CAPS: 50000.0000 [IU] | ORAL_CAPSULE | ORAL | 0 refills | Status: DC

## 2021-03-07 MED ORDER — OZEMPIC (0.25 OR 0.5 MG/DOSE) 2 MG/1.5ML ~~LOC~~ SOPN: 0.2500 mg | PEN_INJECTOR | SUBCUTANEOUS | 0 refills | Status: DC

## 2021-03-07 NOTE — Progress Notes (Signed)
Chief Complaint:   OBESITY Kiara Brown is here to discuss her progress with her obesity treatment plan along with follow-up of her obesity related diagnoses.   Today's visit was #: 2 Starting weight: 243 lbs Starting date: 02/14/2021 Today's weight: 240 lbs Today's date: 02/28/2021 Total lbs lost to date: 3 lbs Body mass index is 44.45 kg/m.  Total weight loss percentage to date: -1.23%  Interim History: Kiara Brown said she is having increased pain today.  Current Meal Plan: the Category 2 Plan for a small percent of the time.  Current Exercise Plan: None at this time.  Assessment/Plan:   1. Vitamin D deficiency Not at goal. Current vitamin D is 9.7, tested on 02/14/2021. Optimal goal > 50 ng/dL.   Plan: Start to take prescription Vitamin D @50 ,000 IU every week as prescribed.  Follow-up for routine testing of Vitamin D, at least 2-3 times per year to avoid over-replacement.   - Start Vitamin D, Ergocalciferol, (DRISDOL) 1.25 MG (50000 UNIT) CAPS capsule; Take 1 capsule (50,000 Units total) by mouth every 7 (seven) days.  Dispense: 12 capsule; Refill: 0  2. Type 2 diabetes mellitus with other specified complication, without long-term current use of insulin (HCC) Diabetes Mellitus: Not at goal. Medication: Glipizide 5 mg daily. Issues reviewed: blood sugar goals, complications of diabetes mellitus, hypoglycemia prevention and treatment, exercise, and nutrition.   Plan: Start Ozempic 0.25 mg subcutaneously weekly. Okay to stop Glipizide after 1 week. The importance of regular follow up with PCP and all other specialists as scheduled was stressed to patient today.   Lab Results  Component Value Date   HGBA1C 6.9 (H) 02/14/2021   HGBA1C 7.0 (H) 02/22/2020   HGBA1C 8 07/02/2017   Lab Results  Component Value Date   LDLCALC 91 02/14/2021   CREATININE 1.06 (H) 11/08/2020   - Start Semaglutide,0.25 or 0.5MG /DOS, (OZEMPIC, 0.25 OR 0.5 MG/DOSE,) 2 MG/1.5ML SOPN; Inject 0.25 mg into  the skin once a week.  Dispense: 1.5 mL; Refill: 0  3. B12 deficiency Lab Results  Component Value Date   VITAMINB12 281 02/14/2021   Supplementation: Oral B12 1000 mcg OTC. Continue current treatment recheck her B12 level with the next set of labs.   4. Visceral obesity Current visceral fat rating: 21. Visceral fat rating should be < 13. Visceral adipose tissue is a hormonally active component of total body fat. This body composition phenotype is associated with medical disorders such as metabolic syndrome, cardiovascular disease and several malignancies including prostate, breast, and colorectal cancers. Starting goal: Lose 7-10% of starting weight.   5. Chronic pain syndrome Pain is uncontrolled. Fibromyalgia, RLS, DDD cervical/lumbar.  Spinal stenosis, lumbar, with neurogenic claudication. We will continue to monitor symptoms as they relate to her weight loss journey.  6. Other hyperlipidemia Course: Not at goal. Lipid-lowering medications: None.   Plan: Dietary changes: Increase soluble fiber, decrease simple carbohydrates, decrease saturated fat. Exercise changes: Moderate to vigorous-intensity aerobic activity 150 minutes per week or as tolerated. We will continue to monitor along with PCP/specialists as it pertains to her weight loss journey.  Lab Results  Component Value Date   CHOL 156 02/14/2021   HDL 43 02/14/2021   LDLCALC 91 02/14/2021   TRIG 121 02/14/2021   CHOLHDL 3.6 02/14/2021   Lab Results  Component Value Date   ALT 13 11/08/2020   AST 12 (L) 11/08/2020   ALKPHOS 116 11/08/2020   BILITOT 0.3 11/08/2020   The 10-year ASCVD risk score (Goff DC  Brooke Bonito., et al., 2013) is: 60.4%   Values used to calculate the score:     Age: 80 years     Sex: Female     Is Non-Hispanic African American: No     Diabetic: Yes     Tobacco smoker: No     Systolic Blood Pressure: 536 mmHg     Is BP treated: Yes     HDL Cholesterol: 43 mg/dL     Total Cholesterol: 156 mg/dL  7.  History of breast cancer, ER+ Right breast mastectomy in 2018. She is taking anastrozole 50 mg daily. She is not having a lot of hot flashes anymore. She is followed by [facility]. Plan:  Continue anastrozole.   8. Obesity, current BMI 44  Course: Kiara Brown is currently in the action stage of change. As such, her goal is to continue with weight loss efforts.   Nutrition goals: She has agreed to the Category 2 Plan.   Exercise goals: Gentle movement.  Behavioral modification strategies: increasing lean protein intake, decreasing simple carbohydrates, increasing vegetables and increasing water intake.  Kiara Brown has agreed to follow-up with our clinic in 3 weeks. She was informed of the importance of frequent follow-up visits to maximize her success with intensive lifestyle modifications for her multiple health conditions.   Objective:   Blood pressure (!) 147/66, pulse 62, temperature 98 F (36.7 C), temperature source Oral, height 5\' 2"  (1.575 m), weight 243 lb (110.2 kg), SpO2 98 %. Body mass index is 44.45 kg/m.  General: Cooperative, alert, well developed, in no acute distress. HEENT: Conjunctivae and lids unremarkable. Cardiovascular: Regular rhythm.  Lungs: Normal work of breathing. Neurologic: No focal deficits.   Lab Results  Component Value Date   CREATININE 1.06 (H) 11/08/2020   BUN 16 11/08/2020   NA 136 11/08/2020   K 4.0 11/08/2020   CL 102 11/08/2020   CO2 27 11/08/2020   Lab Results  Component Value Date   ALT 13 11/08/2020   AST 12 (L) 11/08/2020   ALKPHOS 116 11/08/2020   BILITOT 0.3 11/08/2020   Lab Results  Component Value Date   HGBA1C 6.9 (H) 02/14/2021   HGBA1C 7.0 (H) 02/22/2020   HGBA1C 8 07/02/2017   No results found for: INSULIN Lab Results  Component Value Date   TSH 3.430 02/14/2021   Lab Results  Component Value Date   CHOL 156 02/14/2021   HDL 43 02/14/2021   LDLCALC 91 02/14/2021   TRIG 121 02/14/2021   CHOLHDL 3.6 02/14/2021    Lab Results  Component Value Date   WBC 8.5 11/08/2020   HGB 10.1 (L) 11/08/2020   HCT 31.4 (L) 11/08/2020   MCV 80.7 11/08/2020   PLT 326 11/08/2020   Lab Results  Component Value Date   IRON 41 08/26/2018   TIBC 325 08/26/2018   FERRITIN 22 08/26/2018    Obesity Behavioral Intervention:   Approximately 15 minutes were spent on the discussion below.  ASK: We discussed the diagnosis of obesity with Kiara Brown today and Jayd agreed to give [place] permission to discuss obesity behavioral modification therapy today.  ASSESS: Amarra has the diagnosis of obesity and her BMI today is 44. Georgina is in the action stage of change.   ADVISE: Mahira was educated on the multiple health risks of obesity as well as the benefit of weight loss to improve her health. She was advised of the need for long term treatment and the importance of lifestyle modifications to improve her current health and  to decrease her risk of future health problems.  AGREE: Multiple dietary modification options and treatment options were discussed and Danne agreed to follow the recommendations documented in the above note.  ARRANGE: Subrina was educated on the importance of frequent visits to treat obesity as outlined per CMS and USPSTF guidelines and agreed to schedule her next follow up appointment today.  Attestation Statements:   Reviewed by clinician on day of visit: allergies, medications, problem list, medical history, surgical history, family history, social history, and previous encounter notes.  Leodis Binet Friedenbach, CMA, am acting as Location manager for [employer], DO.  I have reviewed the above documentation for accuracy and completeness, and I agree with the above. Briscoe Deutscher, DO

## 2021-03-21 ENCOUNTER — Ambulatory Visit (INDEPENDENT_AMBULATORY_CARE_PROVIDER_SITE_OTHER): Payer: Medicare Other | Admitting: Physician Assistant

## 2021-03-21 ENCOUNTER — Other Ambulatory Visit: Payer: Self-pay

## 2021-03-21 ENCOUNTER — Encounter (INDEPENDENT_AMBULATORY_CARE_PROVIDER_SITE_OTHER): Payer: Self-pay | Admitting: Physician Assistant

## 2021-03-21 VITALS — BP 148/69 | HR 61 | Temp 97.6°F | Ht 62.0 in | Wt 240.0 lb

## 2021-03-21 DIAGNOSIS — E559 Vitamin D deficiency, unspecified: Secondary | ICD-10-CM

## 2021-03-21 DIAGNOSIS — E1169 Type 2 diabetes mellitus with other specified complication: Secondary | ICD-10-CM

## 2021-03-21 DIAGNOSIS — E66813 Obesity, class 3: Secondary | ICD-10-CM

## 2021-03-21 DIAGNOSIS — Z6841 Body Mass Index (BMI) 40.0 and over, adult: Secondary | ICD-10-CM | POA: Diagnosis not present

## 2021-03-21 MED ORDER — VICTOZA 18 MG/3ML ~~LOC~~ SOPN: 1.8000 mg | PEN_INJECTOR | Freq: Every day | SUBCUTANEOUS | 0 refills | Status: DC

## 2021-03-22 NOTE — Progress Notes (Signed)
Chief Complaint:   OBESITY Willene is here to discuss her progress with her obesity treatment plan along with follow-up of her obesity related diagnoses. Lennette is on the Category 2 Plan and states she is following her eating plan approximately 15% of the time. Finn states she is doing 0 minutes 0 times per week.  Today's visit was #: 3 Starting weight: 243 lbs Starting date: 02/14/2021 Today's weight: 240 lbs Today's date: 03/21/2021 Total lbs lost to date: 3 Total lbs lost since last in-office visit: 3  Interim History: Mckinleigh is experiencing a lot of back pain and she has not been eating enough. She is not sleeping well secondary to pain.  Subjective:   1. Type 2 diabetes mellitus with other specified complication, without long-term current use of insulin (HCC) Nashalie is not on Ozempic due to cost. She has not discontinued glipizide.  2. Vitamin D deficiency Kristine is on Vit D, and she denies nausea, vomiting, or muscle weakness.  Assessment/Plan:   1. Type 2 diabetes mellitus with other specified complication, without long-term current use of insulin (HCC) Rhylen agrees to discontinue Ozempic (not covered by insurance), and start Victoza 0.6 mg daily with no refills. She is to discontinue glipizide after a week of Victoza. Good blood sugar control is important to decrease the likelihood of diabetic complications such as nephropathy, neuropathy, limb loss, blindness, coronary artery disease, and death. Intensive lifestyle modification including diet, exercise and weight loss are the first line of treatment for diabetes.   - liraglutide (VICTOZA) 18 MG/3ML SOPN; Inject 1.8 mg into the skin daily.  Dispense: 9 mL; Refill: 0  2. Vitamin D deficiency Low Vitamin D level contributes to fatigue and are associated with obesity, breast, and colon cancer. Kerrianne agreed to continue taking prescription Vitamin D 50,000 IU every week and will follow-up for routine testing of  Vitamin D, at least 2-3 times per year to avoid over-replacement.  3. Class 3 severe obesity with serious comorbidity and body mass index (BMI) of 40.0 to 44.9 in adult, unspecified obesity type [facility]) Analis is currently in the action stage of change. As such, her goal is to continue with weight loss efforts. She has agreed to the Category 2 Plan.   Exercise goals: No exercise has been prescribed at this time.  Behavioral modification strategies: increasing lean protein intake and meal planning and cooking strategies.  Chyann has agreed to follow-up with our clinic in 3 weeks. She was informed of the importance of frequent follow-up visits to maximize her success with intensive lifestyle modifications for her multiple health conditions.   Objective:   Blood pressure (!) 148/69, pulse 61, temperature 97.6 F (36.4 C), height 5\' 2"  (1.575 m), weight 240 lb (108.9 kg), SpO2 98 %. Body mass index is 43.9 kg/m.  General: Cooperative, alert, well developed, in no acute distress. HEENT: Conjunctivae and lids unremarkable. Cardiovascular: Regular rhythm.  Lungs: Normal work of breathing. Neurologic: No focal deficits.   Lab Results  Component Value Date   CREATININE 1.06 (H) 11/08/2020   BUN 16 11/08/2020   NA 136 11/08/2020   K 4.0 11/08/2020   CL 102 11/08/2020   CO2 27 11/08/2020   Lab Results  Component Value Date   ALT 13 11/08/2020   AST 12 (L) 11/08/2020   ALKPHOS 116 11/08/2020   BILITOT 0.3 11/08/2020   Lab Results  Component Value Date   HGBA1C 6.9 (H) 02/14/2021   HGBA1C 7.0 (H) 02/22/2020   HGBA1C  8 07/02/2017   No results found for: INSULIN Lab Results  Component Value Date   TSH 3.430 02/14/2021   Lab Results  Component Value Date   CHOL 156 02/14/2021   HDL 43 02/14/2021   LDLCALC 91 02/14/2021   TRIG 121 02/14/2021   CHOLHDL 3.6 02/14/2021   Lab Results  Component Value Date   WBC 8.5 11/08/2020   HGB 10.1 (L) 11/08/2020   HCT 31.4 (L)  11/08/2020   MCV 80.7 11/08/2020   PLT 326 11/08/2020   Lab Results  Component Value Date   IRON 41 08/26/2018   TIBC 325 08/26/2018   FERRITIN 22 08/26/2018    Obesity Behavioral Intervention:   Approximately 15 minutes were spent on the discussion below.  ASK: We discussed the diagnosis of obesity with Vira Agar today and Somer agreed to give [place] permission to discuss obesity behavioral modification therapy today.  ASSESS: Eulalie has the diagnosis of obesity and her BMI today is 43.89. Aretta is in the action stage of change.   ADVISE: Mariette was educated on the multiple health risks of obesity as well as the benefit of weight loss to improve her health. She was advised of the need for long term treatment and the importance of lifestyle modifications to improve her current health and to decrease her risk of future health problems.  AGREE: Multiple dietary modification options and treatment options were discussed and Merin agreed to follow the recommendations documented in the above note.  ARRANGE: Mario was educated on the importance of frequent visits to treat obesity as outlined per CMS and USPSTF guidelines and agreed to schedule her next follow up appointment today.  Attestation Statements:   Reviewed by clinician on day of visit: allergies, medications, problem list, medical history, surgical history, family history, social history, and previous encounter notes.   Wilhemena Durie, am acting as transcriptionist for [employer], PA-C.  I have reviewed the above documentation for accuracy and completeness, and I agree with the above. Abby Potash, PA-C

## 2021-03-30 DIAGNOSIS — M48062 Spinal stenosis, lumbar region with neurogenic claudication: Secondary | ICD-10-CM | POA: Diagnosis not present

## 2021-04-06 ENCOUNTER — Encounter (INDEPENDENT_AMBULATORY_CARE_PROVIDER_SITE_OTHER): Payer: Self-pay

## 2021-04-11 ENCOUNTER — Encounter (INDEPENDENT_AMBULATORY_CARE_PROVIDER_SITE_OTHER): Payer: Self-pay | Admitting: Family Medicine

## 2021-04-11 ENCOUNTER — Ambulatory Visit (INDEPENDENT_AMBULATORY_CARE_PROVIDER_SITE_OTHER): Payer: Medicare Other | Admitting: Family Medicine

## 2021-04-11 ENCOUNTER — Other Ambulatory Visit: Payer: Self-pay

## 2021-04-11 VITALS — BP 131/66 | HR 69 | Temp 97.6°F | Ht 62.0 in | Wt 235.0 lb

## 2021-04-11 DIAGNOSIS — E1169 Type 2 diabetes mellitus with other specified complication: Secondary | ICD-10-CM | POA: Diagnosis not present

## 2021-04-11 DIAGNOSIS — Z79899 Other long term (current) drug therapy: Secondary | ICD-10-CM

## 2021-04-11 DIAGNOSIS — G894 Chronic pain syndrome: Secondary | ICD-10-CM

## 2021-04-11 DIAGNOSIS — Z6841 Body Mass Index (BMI) 40.0 and over, adult: Secondary | ICD-10-CM

## 2021-04-11 DIAGNOSIS — Z853 Personal history of malignant neoplasm of breast: Secondary | ICD-10-CM

## 2021-04-17 NOTE — Progress Notes (Signed)
Chief Complaint:   OBESITY Kiara Brown is here to discuss her progress with her obesity treatment plan along with follow-up of her obesity related diagnoses.   Today's visit was #: 4 Starting weight: 243 lbs Starting date: 02/14/2021 Today's weight: 235 lbs Today's date: 04/11/2021 Weight change since last visit: 5 lbs Total lbs lost to date: 8 lbs Body mass index is 42.98 kg/m.  Total weight loss percentage to date: -3.29%  Interim History:  Kiara Brown says she is "probably not getting enough protein".  She reports drinking more water.  She has and ESI scheduled for next week and is hopeful that it will help. Current Meal Plan: the Category 2 Plan for 30% of the time.  Current Exercise Plan: None. Current Anti-Obesity Medications: Victoza 1.8 mg subcutaneously daily. Side effects: None.  Assessment/Plan:   1. Medication management Reviewed message with Dr. Jana Hakim.  Okay to hold Arimidex to see if it helps pain/fatigue.  Will consider going forward if generalized pain and fatigue does not improve with pain management.  2. Chronic pain syndrome Pain is uncontrolled. Fibromyalgia, RLS, DDD cervical/lumbar. Spinal stenosis, lumbar, with neurogenic claudication. We will continue to monitor symptoms as they relate to her weight loss journey.  3. Type 2 diabetes mellitus with other specified complication, without long-term current use of insulin (HCC) Diabetes Mellitus: Not at goal. Medication: Glipizide 5 mg twice daily, Victoza 1.8 mg subcutaneously daily. Issues reviewed: blood sugar goals, complications of diabetes mellitus, hypoglycemia prevention and treatment, exercise, and nutrition.   Plan:  The current medical regimen is effective;  continue present plan and medications. The patient will continue to focus on protein-rich, low simple carbohydrate foods. We reviewed the importance of hydration, regular exercise for stress reduction, and restorative sleep.   Lab Results   Component Value Date   HGBA1C 6.9 (H) 02/14/2021   HGBA1C 7.0 (H) 02/22/2020   HGBA1C 8 07/02/2017   Lab Results  Component Value Date   LDLCALC 91 02/14/2021   CREATININE 1.06 (H) 11/08/2020   4. History of breast cancer, ER+ Right breast mastectomy in 2018.She is taking anastrozole 50 mg daily.  Followed by Dr. Jana Hakim. We will continue to monitor symptoms as they relate to her weight loss journey.  5. Obesity, current BMI 43.1  Course: Kiara Brown is currently in the action stage of change. As such, her goal is to continue with weight loss efforts.   Nutrition goals: She has agreed to the Category 2 Plan.   Exercise goals: As tolerated.  Behavioral modification strategies: increasing lean protein intake, decreasing simple carbohydrates, increasing vegetables and increasing water intake.  Kiara Brown has agreed to follow-up with our clinic in 6 weeks. She was informed of the importance of frequent follow-up visits to maximize her success with intensive lifestyle modifications for her multiple health conditions.   Objective:   Blood pressure 131/66, pulse 69, temperature 97.6 F (36.4 C), temperature source Oral, height 5\' 2"  (1.575 m), weight 235 lb (106.6 kg), SpO2 98 %. Body mass index is 42.98 kg/m.  General: Cooperative, alert, well developed, in no acute distress. HEENT: Conjunctivae and lids unremarkable. Cardiovascular: Regular rhythm.  Lungs: Normal work of breathing. Neurologic: No focal deficits.   Lab Results  Component Value Date   CREATININE 1.06 (H) 11/08/2020   BUN 16 11/08/2020   NA 136 11/08/2020   K 4.0 11/08/2020   CL 102 11/08/2020   CO2 27 11/08/2020   Lab Results  Component Value Date   ALT 13 11/08/2020  AST 12 (L) 11/08/2020   ALKPHOS 116 11/08/2020   BILITOT 0.3 11/08/2020   Lab Results  Component Value Date   HGBA1C 6.9 (H) 02/14/2021   HGBA1C 7.0 (H) 02/22/2020   HGBA1C 8 07/02/2017   Lab Results  Component Value Date   TSH 3.430  02/14/2021   Lab Results  Component Value Date   CHOL 156 02/14/2021   HDL 43 02/14/2021   LDLCALC 91 02/14/2021   TRIG 121 02/14/2021   CHOLHDL 3.6 02/14/2021   Lab Results  Component Value Date   WBC 8.5 11/08/2020   HGB 10.1 (L) 11/08/2020   HCT 31.4 (L) 11/08/2020   MCV 80.7 11/08/2020   PLT 326 11/08/2020   Lab Results  Component Value Date   IRON 41 08/26/2018   TIBC 325 08/26/2018   FERRITIN 22 08/26/2018   Obesity Behavioral Intervention:   Approximately 15 minutes were spent on the discussion below.  ASK: We discussed the diagnosis of obesity with Kiara Brown today and Kiara Brown agreed to give [place] permission to discuss obesity behavioral modification therapy today.  ASSESS: Kiara Brown has the diagnosis of obesity and her BMI today is 43.1. Kiara Brown is in the action stage of change.   ADVISE: Kiara Brown was educated on the multiple health risks of obesity as well as the benefit of weight loss to improve her health. She was advised of the need for long term treatment and the importance of lifestyle modifications to improve her current health and to decrease her risk of future health problems.  AGREE: Multiple dietary modification options and treatment options were discussed and Kiara Brown agreed to follow the recommendations documented in the above note.  ARRANGE: Kiara Brown was educated on the importance of frequent visits to treat obesity as outlined per CMS and USPSTF guidelines and agreed to schedule her next follow up appointment today.  Attestation Statements:   Reviewed by clinician on day of visit: allergies, medications, problem list, medical history, surgical history, family history, social history, and previous encounter notes.  I, Water quality scientist, CMA, am acting as transcriptionist for Briscoe Deutscher, DO  I have reviewed the above documentation for accuracy and completeness, and I agree with the above. Briscoe Deutscher, DO

## 2021-04-18 DIAGNOSIS — H524 Presbyopia: Secondary | ICD-10-CM | POA: Diagnosis not present

## 2021-04-18 DIAGNOSIS — H0289 Other specified disorders of eyelid: Secondary | ICD-10-CM | POA: Diagnosis not present

## 2021-04-18 DIAGNOSIS — H5203 Hypermetropia, bilateral: Secondary | ICD-10-CM | POA: Diagnosis not present

## 2021-04-18 DIAGNOSIS — H52203 Unspecified astigmatism, bilateral: Secondary | ICD-10-CM | POA: Diagnosis not present

## 2021-04-18 DIAGNOSIS — Z961 Presence of intraocular lens: Secondary | ICD-10-CM | POA: Diagnosis not present

## 2021-04-20 DIAGNOSIS — M5416 Radiculopathy, lumbar region: Secondary | ICD-10-CM | POA: Diagnosis not present

## 2021-04-25 ENCOUNTER — Ambulatory Visit (INDEPENDENT_AMBULATORY_CARE_PROVIDER_SITE_OTHER): Payer: Medicare Other | Admitting: Physician Assistant

## 2021-04-25 ENCOUNTER — Other Ambulatory Visit: Payer: Self-pay

## 2021-04-25 ENCOUNTER — Encounter (INDEPENDENT_AMBULATORY_CARE_PROVIDER_SITE_OTHER): Payer: Self-pay | Admitting: Physician Assistant

## 2021-04-25 VITALS — BP 152/69 | HR 61 | Temp 97.8°F | Ht 62.0 in | Wt 240.0 lb

## 2021-04-25 DIAGNOSIS — E1169 Type 2 diabetes mellitus with other specified complication: Secondary | ICD-10-CM

## 2021-04-25 DIAGNOSIS — Z6841 Body Mass Index (BMI) 40.0 and over, adult: Secondary | ICD-10-CM

## 2021-04-26 NOTE — Progress Notes (Signed)
Chief Complaint:   OBESITY Marquetta is here to discuss her progress with her obesity treatment plan along with follow-up of her obesity related diagnoses. Breeann is on the Category 2 Plan and states she is following her eating plan approximately 30% of the time. Kenlie states she is doing 0 minutes 0 times per week.  Today's visit was #: 5 Starting weight: 243 lbs Starting date: 02/14/2021 Today's weight: 240 lbs Today's date: 04/25/2021 Total lbs lost to date: 3 Total lbs lost since last in-office visit: 0  Interim History: Isatou reports doing well with breakfast, but the rest of the day she falls off the plan or eats out. She eats a sandwich for lunch at times but does not eat all of the protein.  Subjective:   1. Type 2 diabetes mellitus with other specified complication, without long-term current use of insulin (HCC) Ronnika's last A1c was 6.9. She is on glipizide and she denies hypoglycemia.  Assessment/Plan:   1. Type 2 diabetes mellitus with other specified complication, without long-term current use of insulin (HCC) Selita will continue her medications and weight loss. Good blood sugar control is important to decrease the likelihood of diabetic complications such as nephropathy, neuropathy, limb loss, blindness, coronary artery disease, and death. Intensive lifestyle modification including diet, exercise and weight loss are the first line of treatment for diabetes.   2. Class 3 severe obesity with serious comorbidity and body mass index (BMI) of 40.0 to 44.9 in adult, unspecified obesity type [facility]) Shauna is currently in the action stage of change. As such, her goal is to continue with weight loss efforts. She has agreed to the Category 2 Plan.   Exercise goals: No exercise has been prescribed at this time.  Behavioral modification strategies: meal planning and cooking strategies and keeping healthy foods in the home.  Trenyce has agreed to follow-up with our clinic  in 2 weeks. She was informed of the importance of frequent follow-up visits to maximize her success with intensive lifestyle modifications for her multiple health conditions.   Objective:   Blood pressure (!) 152/69, pulse 61, temperature 97.8 F (36.6 C), height 5\' 2"  (1.575 m), weight 240 lb (108.9 kg), SpO2 97 %. Body mass index is 43.9 kg/m.  General: Cooperative, alert, well developed, in no acute distress. HEENT: Conjunctivae and lids unremarkable. Cardiovascular: Regular rhythm.  Lungs: Normal work of breathing. Neurologic: No focal deficits.   Lab Results  Component Value Date   CREATININE 1.06 (H) 11/08/2020   BUN 16 11/08/2020   NA 136 11/08/2020   K 4.0 11/08/2020   CL 102 11/08/2020   CO2 27 11/08/2020   Lab Results  Component Value Date   ALT 13 11/08/2020   AST 12 (L) 11/08/2020   ALKPHOS 116 11/08/2020   BILITOT 0.3 11/08/2020   Lab Results  Component Value Date   HGBA1C 6.9 (H) 02/14/2021   HGBA1C 7.0 (H) 02/22/2020   HGBA1C 8 07/02/2017   No results found for: INSULIN Lab Results  Component Value Date   TSH 3.430 02/14/2021   Lab Results  Component Value Date   CHOL 156 02/14/2021   HDL 43 02/14/2021   LDLCALC 91 02/14/2021   TRIG 121 02/14/2021   CHOLHDL 3.6 02/14/2021   Lab Results  Component Value Date   WBC 8.5 11/08/2020   HGB 10.1 (L) 11/08/2020   HCT 31.4 (L) 11/08/2020   MCV 80.7 11/08/2020   PLT 326 11/08/2020   Lab Results  Component  Value Date   IRON 41 08/26/2018   TIBC 325 08/26/2018   FERRITIN 22 08/26/2018    Obesity Behavioral Intervention:   Approximately 15 minutes were spent on the discussion below.  ASK: We discussed the diagnosis of obesity with Vira Agar today and Evoleht agreed to give [place] permission to discuss obesity behavioral modification therapy today.  ASSESS: Sharolyn has the diagnosis of obesity and her BMI today is 43.89. Dinna is in the action stage of change.   ADVISE: Jaelyne was educated on  the multiple health risks of obesity as well as the benefit of weight loss to improve her health. She was advised of the need for long term treatment and the importance of lifestyle modifications to improve her current health and to decrease her risk of future health problems.  AGREE: Multiple dietary modification options and treatment options were discussed and Viviene agreed to follow the recommendations documented in the above note.  ARRANGE: Charie was educated on the importance of frequent visits to treat obesity as outlined per CMS and USPSTF guidelines and agreed to schedule her next follow up appointment today.  Attestation Statements:   Reviewed by clinician on day of visit: allergies, medications, problem list, medical history, surgical history, family history, social history, and previous encounter notes.   Wilhemena Durie, am acting as transcriptionist for [employer], PA-C.  I have reviewed the above documentation for accuracy and completeness, and I agree with the above. Abby Potash, PA-C

## 2021-05-16 ENCOUNTER — Other Ambulatory Visit: Payer: Self-pay

## 2021-05-16 ENCOUNTER — Encounter (INDEPENDENT_AMBULATORY_CARE_PROVIDER_SITE_OTHER): Payer: Self-pay | Admitting: Family Medicine

## 2021-05-16 ENCOUNTER — Ambulatory Visit (INDEPENDENT_AMBULATORY_CARE_PROVIDER_SITE_OTHER): Payer: Medicare Other | Admitting: Family Medicine

## 2021-05-16 VITALS — BP 142/80 | HR 57 | Temp 97.7°F | Ht 62.0 in | Wt 239.0 lb

## 2021-05-16 DIAGNOSIS — Z6841 Body Mass Index (BMI) 40.0 and over, adult: Secondary | ICD-10-CM | POA: Diagnosis not present

## 2021-05-16 DIAGNOSIS — Z72820 Sleep deprivation: Secondary | ICD-10-CM | POA: Diagnosis not present

## 2021-05-16 DIAGNOSIS — F3289 Other specified depressive episodes: Secondary | ICD-10-CM

## 2021-05-16 DIAGNOSIS — E559 Vitamin D deficiency, unspecified: Secondary | ICD-10-CM | POA: Diagnosis not present

## 2021-05-16 DIAGNOSIS — G8929 Other chronic pain: Secondary | ICD-10-CM | POA: Diagnosis not present

## 2021-05-17 MED ORDER — VITAMIN D (ERGOCALCIFEROL) 1.25 MG (50000 UNIT) PO CAPS: 50000.0000 [IU] | ORAL_CAPSULE | ORAL | 0 refills | Status: DC

## 2021-05-23 NOTE — Progress Notes (Signed)
Chief Complaint:   OBESITY Kiara Brown is here to discuss her progress with her obesity treatment plan along with Brown of her obesity related diagnoses.   Today's visit was #: 6 Starting weight: 243 lbs Starting date: 02/14/2021 Today's weight: 239 lbs Today's date: 05/16/2021 Weight change since last visit: 1 lb Total lbs lost to date: 4 lbs Body mass index is 43.71 kg/m.  Total weight loss percentage to date: -1.65%  Interim History: Kiara Brown says Kiara Brown never got the Victoza because it was too expensive for her.  Kiara Brown is not sure if a GLP-1 RA is covered.  Kiara Brown says the [facility] Kiara Brown had done helped her 40-50%.  Kiara Brown has not been sleeping.  Kiara Brown says that for dinner, Kiara Brown has been eating out or eating homemade food.  Kiara Brown has not been getting enough protein.  Current Meal Plan: the Category 2 Plan for 30% of the time.  Current Exercise Plan: None.  Assessment/Plan:   1. Other chronic pain Not controlled.  Recent ESI improved back.  On Arimidex - could trial off.  Consider SSRI/SNRI.  Need to improve sleep.  2. Poor sleep Due to pain, insomnia.  Has trazodone and takes nightly.  Has tramadol and takes as needed.  Okay to try both for the next few nights.  3. Vitamin D deficiency Not at goal. Current vitamin D is 9.7, tested on 02/14/2021. Optimal goal > 50 ng/dL.  Kiara Brown is taking vitamin D 50,000 IU weekly.  Plan: Continue to take prescription Vitamin D @50 ,000 IU every week as prescribed.  Brown for routine testing of Vitamin D, at least 2-3 times per year to avoid over-replacement.  - Refill Vitamin D, Ergocalciferol, (DRISDOL) 1.25 MG (50000 UNIT) CAPS capsule; Take 1 capsule (50,000 Units total) by mouth every 7 (seven) days.  Dispense: 12 capsule; Refill: 0  4. Other depression, with emotional eating Not at goal. Medication: Wellbutrin XL 150 mg daily.  Plan:  Will increase Wellbutrin to 300 mg (patient will take 2).  Consider SSRI/SNRI.  Discussed Prozac, Cymbalta.  5.  Obesity, current BMI 43.7  Course: Kiara Brown is currently in the action stage of change. As such, her goal is to continue with weight loss efforts.   Nutrition goals: Kiara Brown has agreed to keeping a food journal and adhering to recommended goals of 1200 calories and 85 grams of protein.   Exercise goals: Older adults should follow the adult guidelines. When older adults cannot meet the adult guidelines, they should be as physically active as their abilities and conditions will allow.  Older adults should do exercises that maintain or improve balance if they are at risk of falling.   Behavioral modification strategies: increasing lean protein intake, decreasing simple carbohydrates, increasing vegetables, increasing water intake, and emotional eating strategies.  Kiara Brown with our clinic in 3 weeks. Kiara Brown was informed of the importance of frequent Brown visits to maximize her success with intensive lifestyle modifications for her multiple health conditions.   Objective:   Blood pressure (!) 142/80, pulse (!) 57, temperature 97.7 F (36.5 C), temperature source Oral, height 5\' 2"  (1.575 m), weight 239 lb (108.4 kg), SpO2 95 %. Body mass index is 43.71 kg/m.  General: Cooperative, alert, well developed, in no acute distress. HEENT: Conjunctivae and lids unremarkable. Cardiovascular: Regular rhythm.  Lungs: Normal work of breathing. Neurologic: No focal deficits.   Lab Results  Component Value Date   CREATININE 1.06 (H) 11/08/2020   BUN 16 11/08/2020  NA 136 11/08/2020   K 4.0 11/08/2020   CL 102 11/08/2020   CO2 27 11/08/2020   Lab Results  Component Value Date   ALT 13 11/08/2020   AST 12 (L) 11/08/2020   ALKPHOS 116 11/08/2020   BILITOT 0.3 11/08/2020   Lab Results  Component Value Date   HGBA1C 6.9 (H) 02/14/2021   HGBA1C 7.0 (H) 02/22/2020   HGBA1C 8 07/02/2017   Lab Results  Component Value Date   TSH 3.430 02/14/2021   Lab Results  Component  Value Date   CHOL 156 02/14/2021   HDL 43 02/14/2021   LDLCALC 91 02/14/2021   TRIG 121 02/14/2021   CHOLHDL 3.6 02/14/2021   Lab Results  Component Value Date   WBC 8.5 11/08/2020   HGB 10.1 (L) 11/08/2020   HCT 31.4 (L) 11/08/2020   MCV 80.7 11/08/2020   PLT 326 11/08/2020   Lab Results  Component Value Date   IRON 41 08/26/2018   TIBC 325 08/26/2018   FERRITIN 22 08/26/2018   Obesity Behavioral Intervention:   Approximately 15 minutes were spent on the discussion below.  ASK: We discussed the diagnosis of obesity with Kiara Brown today and Kiara Brown agreed to give [place] permission to discuss obesity behavioral modification therapy today.  ASSESS: Kiara Brown has the diagnosis of obesity and her BMI today is 43.7. Kiara Brown is in the action stage of change.   ADVISE: Kiara Brown on the multiple health risks of obesity as well as the benefit of weight loss to improve her health. Kiara Brown was advised of the need for long term treatment and the importance of lifestyle modifications to improve her current health and to decrease her risk of future health problems.  AGREE: Multiple dietary modification options and treatment options were discussed and Kiara Brown agreed to follow the recommendations documented in the above note.  ARRANGE: Kiara Brown on the importance of frequent visits to treat obesity as outlined per CMS and USPSTF guidelines and agreed to schedule her next follow up appointment today.  Attestation Statements:   Reviewed by clinician on day of visit: allergies, medications, problem list, medical history, surgical history, family history, social history, and previous encounter notes.  I, Water quality scientist, CMA, am acting as transcriptionist for Briscoe Deutscher, DO  I have reviewed the above documentation for accuracy and completeness, and I agree with the above. Briscoe Deutscher, DO

## 2021-06-05 DIAGNOSIS — M2041 Other hammer toe(s) (acquired), right foot: Secondary | ICD-10-CM | POA: Diagnosis not present

## 2021-06-05 DIAGNOSIS — M2042 Other hammer toe(s) (acquired), left foot: Secondary | ICD-10-CM | POA: Diagnosis not present

## 2021-06-05 DIAGNOSIS — M79675 Pain in left toe(s): Secondary | ICD-10-CM | POA: Diagnosis not present

## 2021-06-05 DIAGNOSIS — M79674 Pain in right toe(s): Secondary | ICD-10-CM | POA: Diagnosis not present

## 2021-06-06 ENCOUNTER — Other Ambulatory Visit: Payer: Self-pay

## 2021-06-06 ENCOUNTER — Encounter (INDEPENDENT_AMBULATORY_CARE_PROVIDER_SITE_OTHER): Payer: Self-pay | Admitting: Physician Assistant

## 2021-06-06 ENCOUNTER — Ambulatory Visit (INDEPENDENT_AMBULATORY_CARE_PROVIDER_SITE_OTHER): Payer: Medicare Other | Admitting: Physician Assistant

## 2021-06-06 VITALS — BP 145/71 | HR 59 | Temp 97.5°F | Ht 62.0 in | Wt 235.0 lb

## 2021-06-06 DIAGNOSIS — Z6841 Body Mass Index (BMI) 40.0 and over, adult: Secondary | ICD-10-CM

## 2021-06-06 DIAGNOSIS — E1169 Type 2 diabetes mellitus with other specified complication: Secondary | ICD-10-CM

## 2021-06-06 MED ORDER — OZEMPIC (0.25 OR 0.5 MG/DOSE) 2 MG/1.5ML ~~LOC~~ SOPN: 0.2500 mg | PEN_INJECTOR | SUBCUTANEOUS | 0 refills | Status: DC

## 2021-06-13 NOTE — Progress Notes (Signed)
Chief Complaint:   OBESITY Kiara Brown is here to discuss her progress with her obesity treatment plan along with follow-up of her obesity related diagnoses. Kiara Brown is on keeping a food journal and adhering to recommended goals of 1200 calories and 85 grams of protein daily and states she is following her eating plan approximately 30% of the time. Kiara Brown states she is doing 0 minutes 0 times per week.  Today's visit was #: 7 Starting weight: 243 lbs Starting date: 02/14/2021 Today's weight: 235 lbs Today's date: 06/06/2021 Total lbs lost to date: 8 Total lbs lost since last in-office visit: 4  Interim History: Kiara Brown is doing very well with weight loss. She reports being in a lot of pain daily and not sleeping well. She reports that her pain level drives whether or not she is following the plan. She did not start Victoza as it was too expensive.  Subjective:   1. Type 2 diabetes mellitus with other specified complication, without long-term current use of insulin (HCC) Kiara Brown is on glipizide, and her last A1c was 6.9. Her fasting BGs range between 76 and 120.  Assessment/Plan:   1. Type 2 diabetes mellitus with other specified complication, without long-term current use of insulin ([place]) Kiara Brown agreed to start Ozempic 0.25 mg with no refills. (Coupon was given). She will stop glipizide when she starts Ozempic. Good blood sugar control is important to decrease the likelihood of diabetic complications such as nephropathy, neuropathy, limb loss, blindness, coronary artery disease, and death. Intensive lifestyle modification including diet, exercise and weight loss are the first line of treatment for diabetes.   - Semaglutide,0.25 or 0.5MG /DOS, (OZEMPIC, 0.25 OR 0.5 MG/DOSE,) 2 MG/1.5ML SOPN; Inject 0.25 mg into the skin once a week.  Dispense: 1.5 mL; Refill: 0  2. Obesity, current BMI 43.7 Kiara Brown is currently in the action stage of change. As such, her goal is to continue with weight  loss efforts. She has agreed to the Category 2 Plan.   Exercise goals: No exercise has been prescribed at this time.  Behavioral modification strategies: meal planning and cooking strategies and keeping healthy foods in the home.  Kiara Brown has agreed to follow-up with our clinic in 2 to 3 weeks. She was informed of the importance of frequent follow-up visits to maximize her success with intensive lifestyle modifications for her multiple health conditions.   Objective:   Blood pressure (!) 145/71, pulse (!) 59, temperature (!) 97.5 F (36.4 C), height 5\' 2"  (1.575 m), weight 235 lb (106.6 kg), SpO2 98 %. Body mass index is 42.98 kg/m.  General: Cooperative, alert, well developed, in no acute distress. HEENT: Conjunctivae and lids unremarkable. Cardiovascular: Regular rhythm.  Lungs: Normal work of breathing. Neurologic: No focal deficits.   Lab Results  Component Value Date   CREATININE 1.06 (H) 11/08/2020   BUN 16 11/08/2020   NA 136 11/08/2020   K 4.0 11/08/2020   CL 102 11/08/2020   CO2 27 11/08/2020   Lab Results  Component Value Date   ALT 13 11/08/2020   AST 12 (L) 11/08/2020   ALKPHOS 116 11/08/2020   BILITOT 0.3 11/08/2020   Lab Results  Component Value Date   HGBA1C 6.9 (H) 02/14/2021   HGBA1C 7.0 (H) 02/22/2020   HGBA1C 8 07/02/2017   No results found for: INSULIN Lab Results  Component Value Date   TSH 3.430 02/14/2021   Lab Results  Component Value Date   CHOL 156 02/14/2021   HDL 43 02/14/2021  LDLCALC 91 02/14/2021   TRIG 121 02/14/2021   CHOLHDL 3.6 02/14/2021   Lab Results  Component Value Date   VD25OH 9.7 (L) 02/14/2021   Lab Results  Component Value Date   WBC 8.5 11/08/2020   HGB 10.1 (L) 11/08/2020   HCT 31.4 (L) 11/08/2020   MCV 80.7 11/08/2020   PLT 326 11/08/2020   Lab Results  Component Value Date   IRON 41 08/26/2018   TIBC 325 08/26/2018   FERRITIN 22 08/26/2018    Obesity Behavioral Intervention:   Approximately  15 minutes were spent on the discussion below.  ASK: We discussed the diagnosis of obesity with Kiara Brown today and Kiara Brown agreed to give [place] permission to discuss obesity behavioral modification therapy today.  ASSESS: Kiara Brown has the diagnosis of obesity and her BMI today is 42.97. Kiara Brown is in the action stage of change.   Kiara Brown: Kiara Brown was educated on the multiple health risks of obesity as well as the benefit of weight loss to improve her health. She was advised of the need for long term treatment and the importance of lifestyle modifications to improve her current health and to decrease her risk of future health problems.  Kiara Brown: Multiple dietary modification options and treatment options were discussed and Kiara Brown agreed to follow the recommendations documented in the above note.  ARRANGE: Kiara Brown was educated on the importance of frequent visits to treat obesity as outlined per CMS and USPSTF guidelines and agreed to schedule her next follow up appointment today.  Attestation Statements:   Reviewed by clinician on day of visit: allergies, medications, problem list, medical history, surgical history, family history, social history, and previous encounter notes.   Kiara Brown, am acting as transcriptionist for [employer], PA-C.  I have reviewed the above documentation for accuracy and completeness, and I Kiara Brown with the above. Abby Potash, PA-C

## 2021-06-27 ENCOUNTER — Encounter (INDEPENDENT_AMBULATORY_CARE_PROVIDER_SITE_OTHER): Payer: Self-pay | Admitting: Adult Health

## 2021-06-27 ENCOUNTER — Other Ambulatory Visit: Payer: Self-pay

## 2021-06-27 ENCOUNTER — Ambulatory Visit (INDEPENDENT_AMBULATORY_CARE_PROVIDER_SITE_OTHER): Payer: Medicare Other | Admitting: Adult Health

## 2021-06-27 VITALS — BP 146/78 | HR 59 | Temp 97.5°F | Ht 62.0 in | Wt 235.0 lb

## 2021-06-27 DIAGNOSIS — Z6841 Body Mass Index (BMI) 40.0 and over, adult: Secondary | ICD-10-CM | POA: Diagnosis not present

## 2021-06-27 DIAGNOSIS — E1169 Type 2 diabetes mellitus with other specified complication: Secondary | ICD-10-CM | POA: Diagnosis not present

## 2021-06-27 DIAGNOSIS — G8929 Other chronic pain: Secondary | ICD-10-CM | POA: Diagnosis not present

## 2021-06-27 DIAGNOSIS — E559 Vitamin D deficiency, unspecified: Secondary | ICD-10-CM

## 2021-07-03 DIAGNOSIS — G8929 Other chronic pain: Secondary | ICD-10-CM | POA: Insufficient documentation

## 2021-07-03 DIAGNOSIS — E559 Vitamin D deficiency, unspecified: Secondary | ICD-10-CM | POA: Insufficient documentation

## 2021-07-03 NOTE — Progress Notes (Signed)
Chief Complaint:   OBESITY Kiara Brown is here to discuss her progress with her obesity treatment plan along with follow-up of her obesity related diagnoses. Kiara Brown is on the Category 2 Plan and states she is following her eating plan approximately 40% of the time. Kiara Brown states she is not currently exercising.   Today's visit was #: 8 Starting weight: 243 lbs Starting date: 02/14/2021 Today's weight: 235 lbs Today's date: 06/27/2021 Total lbs lost to date: 8 Total lbs lost since last in-office visit: 0  Interim History: Kiara Brown underwent dental extractions on 27 July 22- 2 top right side.  She is currently on soft diet, as she is unable to masticate food. Follow up with dentist 11 Aug 22.   Subjective:   1. Type 2 diabetes mellitus with other specified complication, without long-term current use of insulin ([place]) 02/14/2021 A1c 6.9. Kiara Brown is on glipizide 5 mg QAM. Her ambulatory fasting BG runs 90-100. She is unable to afford Ozempic, as out of pocket cost is $400 per month. She is intolerant to Metformin due to diarrhea.  Lab Results  Component Value Date   HGBA1C 6.9 (H) 02/14/2021   HGBA1C 7.0 (H) 02/22/2020   HGBA1C 8 07/02/2017   Lab Results  Component Value Date   LDLCALC 91 02/14/2021   CREATININE 1.06 (H) 11/08/2020   No results found for: INSULIN  2. Vitamin D deficiency 02/14/2021 Vit D level was 9.7 - well below goal of 50. She is currently taking prescription vitamin D 50,000 IU each week. She denies nausea, vomiting or muscle weakness.   Lab Results  Component Value Date   VD25OH 9.7 (L) 02/14/2021   3. Other chronic pain Willis has a history of Rheumatoid Arthritis, fibromyalgia, and chronic lumbar back pain with sciatica.  She uses OTC Aleve and Ultram 50 mg PRN and estimates to use 5 tabs a week.  Assessment/Plan:   1. Type 2 diabetes mellitus with other specified complication, without long-term current use of insulin (HCC) Kiara Brown is not in donut  hole with Medicare Part D. Continue glipizide 5 mg. Good blood sugar control is important to decrease the likelihood of diabetic complications such as nephropathy, neuropathy, limb loss, blindness, coronary artery disease, and death. Intensive lifestyle modification including diet, exercise and weight loss are the first line of treatment for diabetes.   2. Vitamin D deficiency Low Vitamin D level contributes to fatigue and are associated with obesity, breast, and colon cancer. She agrees to continue to take prescription Vitamin D 50,000 IU every week and will follow-up for routine testing of Vitamin D, at least 2-3 times per year to avoid over-replacement. No refill needed at this time.  3. Other chronic pain Continue with weight loss efforts. Continue with heat and ice.  4. Obesity, current BMI 43.0  Kiara Brown is currently in the action stage of change. As such, her goal is to continue with weight loss efforts. She has agreed to the Category 2 Plan. Soft diet until follow up with dentist 11 Aug 22.  Choose high protein soft foods. Handout: High Protein/Low Cal Foods.  Exercise goals:  Activity as tolerated.  Behavioral modification strategies: increasing lean protein intake, decreasing simple carbohydrates, meal planning and cooking strategies, keeping healthy foods in the home, and planning for success.  Kiara Brown has agreed to follow-up with our clinic in 3 weeks. She was informed of the importance of frequent follow-up visits to maximize her success with intensive lifestyle modifications for her multiple health conditions.  Objective:   Blood pressure (!) 146/78, pulse (!) 59, temperature (!) 97.5 F (36.4 C), height '5\' 2"'$  (1.575 m), weight 235 lb (106.6 kg), SpO2 99 %. Body mass index is 42.98 kg/m.  General: Cooperative, alert, well developed, in no acute distress. HEENT: Conjunctivae and lids unremarkable. Cardiovascular: Regular rhythm.  Lungs: Normal work of  breathing. Neurologic: No focal deficits.   Lab Results  Component Value Date   CREATININE 1.06 (H) 11/08/2020   BUN 16 11/08/2020   NA 136 11/08/2020   K 4.0 11/08/2020   CL 102 11/08/2020   CO2 27 11/08/2020   Lab Results  Component Value Date   ALT 13 11/08/2020   AST 12 (L) 11/08/2020   ALKPHOS 116 11/08/2020   BILITOT 0.3 11/08/2020   Lab Results  Component Value Date   HGBA1C 6.9 (H) 02/14/2021   HGBA1C 7.0 (H) 02/22/2020   HGBA1C 8 07/02/2017   No results found for: INSULIN Lab Results  Component Value Date   TSH 3.430 02/14/2021   Lab Results  Component Value Date   CHOL 156 02/14/2021   HDL 43 02/14/2021   LDLCALC 91 02/14/2021   TRIG 121 02/14/2021   CHOLHDL 3.6 02/14/2021   Lab Results  Component Value Date   VD25OH 9.7 (L) 02/14/2021   Lab Results  Component Value Date   WBC 8.5 11/08/2020   HGB 10.1 (L) 11/08/2020   HCT 31.4 (L) 11/08/2020   MCV 80.7 11/08/2020   PLT 326 11/08/2020   Lab Results  Component Value Date   IRON 41 08/26/2018   TIBC 325 08/26/2018   FERRITIN 22 08/26/2018    Attestation Statements:   Reviewed by clinician on day of visit: allergies, medications, problem list, medical history, surgical history, family history, social history, and previous encounter notes.  Time spent on visit including pre-visit chart review and post-visit care and charting was 32 minutes.   Coral Ceo, CMA, am acting as transcriptionist for Mina Marble, NP.  I have reviewed the above documentation for accuracy and completeness, and I agree with the above. -  Xavia Kniskern d. Alaysia Lightle, NP-C

## 2021-07-12 DIAGNOSIS — E1159 Type 2 diabetes mellitus with other circulatory complications: Secondary | ICD-10-CM | POA: Diagnosis not present

## 2021-07-12 DIAGNOSIS — I110 Hypertensive heart disease with heart failure: Secondary | ICD-10-CM | POA: Diagnosis not present

## 2021-07-12 DIAGNOSIS — E1142 Type 2 diabetes mellitus with diabetic polyneuropathy: Secondary | ICD-10-CM | POA: Diagnosis not present

## 2021-07-12 DIAGNOSIS — I10 Essential (primary) hypertension: Secondary | ICD-10-CM | POA: Diagnosis not present

## 2021-07-12 DIAGNOSIS — Z0000 Encounter for general adult medical examination without abnormal findings: Secondary | ICD-10-CM | POA: Diagnosis not present

## 2021-07-12 DIAGNOSIS — I5032 Chronic diastolic (congestive) heart failure: Secondary | ICD-10-CM | POA: Diagnosis not present

## 2021-07-12 DIAGNOSIS — D638 Anemia in other chronic diseases classified elsewhere: Secondary | ICD-10-CM | POA: Diagnosis not present

## 2021-07-12 DIAGNOSIS — N3281 Overactive bladder: Secondary | ICD-10-CM | POA: Diagnosis not present

## 2021-07-12 DIAGNOSIS — E611 Iron deficiency: Secondary | ICD-10-CM | POA: Diagnosis not present

## 2021-07-12 DIAGNOSIS — E559 Vitamin D deficiency, unspecified: Secondary | ICD-10-CM | POA: Diagnosis not present

## 2021-07-12 DIAGNOSIS — F5101 Primary insomnia: Secondary | ICD-10-CM | POA: Diagnosis not present

## 2021-07-12 DIAGNOSIS — Z6841 Body Mass Index (BMI) 40.0 and over, adult: Secondary | ICD-10-CM | POA: Diagnosis not present

## 2021-07-12 DIAGNOSIS — E782 Mixed hyperlipidemia: Secondary | ICD-10-CM | POA: Diagnosis not present

## 2021-07-13 DIAGNOSIS — Z20822 Contact with and (suspected) exposure to covid-19: Secondary | ICD-10-CM | POA: Diagnosis not present

## 2021-07-14 DIAGNOSIS — I10 Essential (primary) hypertension: Secondary | ICD-10-CM | POA: Diagnosis not present

## 2021-07-14 DIAGNOSIS — U071 COVID-19: Secondary | ICD-10-CM | POA: Diagnosis not present

## 2021-07-18 ENCOUNTER — Ambulatory Visit (INDEPENDENT_AMBULATORY_CARE_PROVIDER_SITE_OTHER): Payer: Medicare Other | Admitting: Adult Health

## 2021-08-01 ENCOUNTER — Other Ambulatory Visit: Payer: Self-pay

## 2021-08-01 ENCOUNTER — Encounter (INDEPENDENT_AMBULATORY_CARE_PROVIDER_SITE_OTHER): Payer: Self-pay | Admitting: Family Medicine

## 2021-08-01 ENCOUNTER — Ambulatory Visit (INDEPENDENT_AMBULATORY_CARE_PROVIDER_SITE_OTHER): Payer: Medicare Other | Admitting: Family Medicine

## 2021-08-01 VITALS — BP 140/80 | HR 65 | Temp 98.0°F | Ht 62.0 in | Wt 230.0 lb

## 2021-08-01 DIAGNOSIS — R42 Dizziness and giddiness: Secondary | ICD-10-CM

## 2021-08-01 DIAGNOSIS — J3089 Other allergic rhinitis: Secondary | ICD-10-CM | POA: Diagnosis not present

## 2021-08-01 DIAGNOSIS — Z6841 Body Mass Index (BMI) 40.0 and over, adult: Secondary | ICD-10-CM

## 2021-08-01 DIAGNOSIS — E559 Vitamin D deficiency, unspecified: Secondary | ICD-10-CM

## 2021-08-01 DIAGNOSIS — E1169 Type 2 diabetes mellitus with other specified complication: Secondary | ICD-10-CM

## 2021-08-01 MED ORDER — METFORMIN HCL 500 MG PO TABS: 500.0000 mg | ORAL_TABLET | Freq: Every day | ORAL | 0 refills | Status: DC

## 2021-08-01 MED ORDER — FLUTICASONE PROPIONATE 50 MCG/ACT NA SUSP: 2.0000 | Freq: Every day | NASAL | 0 refills | Status: DC

## 2021-08-02 NOTE — Progress Notes (Signed)
Chief Complaint:   OBESITY Rachale is here to discuss her progress with her obesity treatment plan along with follow-up of her obesity related diagnoses. Lanique is on the Category 2 Plan and states she is following her eating plan approximately 0% of the time. Mahiyah states she is doing 0 minutes 0 times per week.  Today's visit was #: 9 Starting weight: 243 lbs Starting date: 02/14/2021 Today's weight: 230 lbs Today's date: 08/01/2021 Total lbs lost to date: 13 Total lbs lost since last in-office visit: 5  Interim History: Jamaka has done well with weight loss. She had COVID19 during this time, and her appetite was decreased. She is back to a regular diet, and her hunger is controlled.  Subjective:   1. Dizziness Samanth is recovering from [place] and she still notes feeling lightheaded and somewhat dizzy, but she does not describe vertigo. Her blood pressure is stable.  2. Allergic rhinitis due to other allergic trigger, unspecified seasonality Yolande notes increased allergy symptoms which may be in part affecting her equilibrium.   3. Type 2 diabetes mellitus with other specified complication, without long-term current use of insulin (HCC) Irena is on glipizide, and she did not bring her blood sugar log today. She notes increased lightheadedness, and this may be due in part to hypoglycemia. She is doing very well on her eating plan.  Assessment/Plan:   1. Dizziness Allyiah is to increase her water intake and will follow up in 2-3 weeks. She is to discuss this with her primary care physician if her symptoms fail to improve.  2. Allergic rhinitis due to other allergic trigger, unspecified seasonality Adelayde agreed to start Flonase 2 sprays into each nostril q daily with no refills.   - fluticasone (FLONASE) 50 MCG/ACT nasal spray; Place 2 sprays into both nostrils daily.  Dispense: 16 g; Refill: 0  3. Type 2 diabetes mellitus with other specified complication, without  long-term current use of insulin ([place]) Lawan agreed to discontinue glipizide, and start metformin 500 mg 1/2 PO q AM with food, with no refills. Good blood sugar control is important to decrease the likelihood of diabetic complications such as nephropathy, neuropathy, limb loss, blindness, coronary artery disease, and death. Intensive lifestyle modification including diet, exercise and weight loss are the first line of treatment for diabetes.   - metFORMIN (GLUCOPHAGE) 500 MG tablet; Take 1 tablet (500 mg total) by mouth daily. Take 1/2 of pill by mouth every morning with food.  Dispense: 30 tablet; Refill: 0  4. Obesity with current BMI 42.2 Ovetta is currently in the action stage of change. As such, her goal is to continue with weight loss efforts. She has agreed to the Category 2 Plan.   Behavioral modification strategies: increasing lean protein intake and increasing water intake.  Epifania has agreed to follow-up with our clinic in 2 to 3 weeks. She was informed of the importance of frequent follow-up visits to maximize her success with intensive lifestyle modifications for her multiple health conditions.   Objective:   Blood pressure 140/80, pulse 65, temperature 98 F (36.7 C), height '5\' 2"'$  (1.575 m), weight 230 lb (104.3 kg), SpO2 98 %. Body mass index is 42.07 kg/m.  General: Cooperative, alert, well developed, in no acute distress. HEENT: Conjunctivae and lids unremarkable. Cardiovascular: Regular rhythm.  Lungs: Normal work of breathing. Neurologic: No focal deficits.   Lab Results  Component Value Date   CREATININE 1.06 (H) 11/08/2020   BUN 16 11/08/2020   NA  136 11/08/2020   K 4.0 11/08/2020   CL 102 11/08/2020   CO2 27 11/08/2020   Lab Results  Component Value Date   ALT 13 11/08/2020   AST 12 (L) 11/08/2020   ALKPHOS 116 11/08/2020   BILITOT 0.3 11/08/2020   Lab Results  Component Value Date   HGBA1C 6.9 (H) 02/14/2021   HGBA1C 7.0 (H) 02/22/2020   HGBA1C  8 07/02/2017   No results found for: INSULIN Lab Results  Component Value Date   TSH 3.430 02/14/2021   Lab Results  Component Value Date   CHOL 156 02/14/2021   HDL 43 02/14/2021   LDLCALC 91 02/14/2021   TRIG 121 02/14/2021   CHOLHDL 3.6 02/14/2021   Lab Results  Component Value Date   VD25OH 9.7 (L) 02/14/2021   Lab Results  Component Value Date   WBC 8.5 11/08/2020   HGB 10.1 (L) 11/08/2020   HCT 31.4 (L) 11/08/2020   MCV 80.7 11/08/2020   PLT 326 11/08/2020   Lab Results  Component Value Date   IRON 41 08/26/2018   TIBC 325 08/26/2018   FERRITIN 22 08/26/2018    Obesity Behavioral Intervention:   Approximately 15 minutes were spent on the discussion below.  ASK: We discussed the diagnosis of obesity with Vira Agar today and Lamarr agreed to give [place] permission to discuss obesity behavioral modification therapy today.  ASSESS: Zymia has the diagnosis of obesity and her BMI today is 42.2. Risako is in the action stage of change.   ADVISE: Jodi was educated on the multiple health risks of obesity as well as the benefit of weight loss to improve her health. She was advised of the need for long term treatment and the importance of lifestyle modifications to improve her current health and to decrease her risk of future health problems.  AGREE: Multiple dietary modification options and treatment options were discussed and Zymia agreed to follow the recommendations documented in the above note.  ARRANGE: Amitiel was educated on the importance of frequent visits to treat obesity as outlined per CMS and USPSTF guidelines and agreed to schedule her next follow up appointment today.  Attestation Statements:   Reviewed by clinician on day of visit: allergies, medications, problem list, medical history, surgical history, family history, social history, and previous encounter notes.   I, Trixie Dredge, am acting as transcriptionist for Dennard Nip, MD.  I  have reviewed the above documentation for accuracy and completeness, and I agree with the above. -  Dennard Nip, MD

## 2021-08-16 ENCOUNTER — Encounter: Payer: Self-pay | Admitting: Oncology

## 2021-08-16 DIAGNOSIS — Z1231 Encounter for screening mammogram for malignant neoplasm of breast: Secondary | ICD-10-CM | POA: Diagnosis not present

## 2021-08-21 ENCOUNTER — Ambulatory Visit (INDEPENDENT_AMBULATORY_CARE_PROVIDER_SITE_OTHER): Payer: Medicare Other | Admitting: Family Medicine

## 2021-08-21 ENCOUNTER — Other Ambulatory Visit: Payer: Self-pay

## 2021-08-21 ENCOUNTER — Encounter (INDEPENDENT_AMBULATORY_CARE_PROVIDER_SITE_OTHER): Payer: Self-pay | Admitting: Family Medicine

## 2021-08-21 VITALS — BP 152/77 | HR 60 | Temp 97.6°F | Ht 62.0 in | Wt 229.0 lb

## 2021-08-21 DIAGNOSIS — E1169 Type 2 diabetes mellitus with other specified complication: Secondary | ICD-10-CM

## 2021-08-21 DIAGNOSIS — Z6841 Body Mass Index (BMI) 40.0 and over, adult: Secondary | ICD-10-CM | POA: Diagnosis not present

## 2021-08-21 DIAGNOSIS — J302 Other seasonal allergic rhinitis: Secondary | ICD-10-CM

## 2021-08-21 MED ORDER — FEXOFENADINE HCL 180 MG PO TABS: 180.0000 mg | ORAL_TABLET | Freq: Every day | ORAL | 0 refills | Status: AC

## 2021-08-22 NOTE — Progress Notes (Signed)
Chief Complaint:   OBESITY Kiara Brown is here to discuss her progress with her obesity treatment plan along with follow-up of her obesity related diagnoses. Kiara Brown is on the Category 2 Plan and states she is following her eating plan approximately 60% of the time. Kiara Brown states she is doing 0 minutes 0 times per week.  Today's visit was #: 10 Starting weight: 243 lbs Starting date: 02/14/2021 Today's weight: 229 lbs Today's date: 08/21/2021 Total lbs lost to date: 14 Total lbs lost since last in-office visit: 1  Interim History: Kiara Brown notes that she has a lot of problems so it has been difficult to stay on a pattern of healthy eating. She denies issues with the meal plan or problems or concerns when eating on the plan. She denies cravings or hunger. She is a patient of Dr. Juleen China and this is her first appointment with me.  Subjective:   1. Type 2 diabetes mellitus with other specified complication, without long-term current use of insulin (HCC) Kiara Brown denies issues or concerns with her blood sugars. She denies dizziness. She is having trouble with temp regulation and she feels hot then cold. Her last office visit she was started on metformin and discontinued glipizide. Her lowest fasting blood sugar is 97 and highest blood sugar is 110.  2. Seasonal allergies Kiara Brown has seasonal allergies that have been more bothersome with the change of weather/season.    Assessment/Plan:  No orders of the defined types were placed in this encounter.   There are no discontinued medications.   Meds ordered this encounter  Medications   fexofenadine (ALLEGRA) 180 MG tablet    Sig: Take 1 tablet (180 mg total) by mouth daily.    Dispense:  30 tablet    Refill:  0    30 d supply;  ** OV for RF **   Do not send RF request     1. Type 2 diabetes mellitus with other specified complication, without long-term current use of insulin ([place]) I recommend the same as Dr. Leafy Ro,  discontinue glipizide and start metformin 1/2 tablet for 6 days, if tolerating it well it is ok to increased to 1 tablet q daily. Good blood sugar control is important to decrease the likelihood of diabetic complications such as nephropathy, neuropathy, limb loss, blindness, coronary artery disease, and death. Intensive lifestyle modification including diet, exercise and weight loss are the first line of treatment for diabetes.   2. Seasonal allergies Kiara Brown will continue Flonase and add OTC Allegra q daily until allergy season is over then use as needed.  - fexofenadine (ALLEGRA) 180 MG tablet; Take 1 tablet (180 mg total) by mouth daily.  Dispense: 30 tablet; Refill: 0  3. Obesity with current BMI of 41.9 Kiara Brown is currently in the action stage of change. As such, her goal is to continue with weight loss efforts. She has agreed to the Category 2 Plan.   Exercise goals: All adults should avoid inactivity. Some physical activity is better than none, and adults who participate in any amount of physical activity gain some health benefits. Start walking for 10 minutes per day.  Behavioral modification strategies: increasing lean protein intake, decreasing simple carbohydrates, and planning for success.  Kiara Brown has agreed to follow-up with our clinic in 3 to 4 weeks per her preference. She was informed of the importance of frequent follow-up visits to maximize her success with intensive lifestyle modifications for her multiple health conditions.   Objective:  Blood pressure (!) 152/77, pulse 60, temperature 97.6 F (36.4 C), height 5\' 2"  (1.575 m), weight 229 lb (103.9 kg), SpO2 97 %. Body mass index is 41.88 kg/m.  General: Cooperative, alert, well developed, in no acute distress. HEENT: Conjunctivae and lids unremarkable. Cardiovascular: Regular rhythm.  Lungs: Normal work of breathing. Neurologic: No focal deficits.   Lab Results  Component Value Date   CREATININE 1.06 (H)  11/08/2020   BUN 16 11/08/2020   NA 136 11/08/2020   K 4.0 11/08/2020   CL 102 11/08/2020   CO2 27 11/08/2020   Lab Results  Component Value Date   ALT 13 11/08/2020   AST 12 (L) 11/08/2020   ALKPHOS 116 11/08/2020   BILITOT 0.3 11/08/2020   Lab Results  Component Value Date   HGBA1C 6.9 (H) 02/14/2021   HGBA1C 7.0 (H) 02/22/2020   HGBA1C 8 07/02/2017   No results found for: INSULIN Lab Results  Component Value Date   TSH 3.430 02/14/2021   Lab Results  Component Value Date   CHOL 156 02/14/2021   HDL 43 02/14/2021   LDLCALC 91 02/14/2021   TRIG 121 02/14/2021   CHOLHDL 3.6 02/14/2021   Lab Results  Component Value Date   VD25OH 9.7 (L) 02/14/2021   Lab Results  Component Value Date   WBC 8.5 11/08/2020   HGB 10.1 (L) 11/08/2020   HCT 31.4 (L) 11/08/2020   MCV 80.7 11/08/2020   PLT 326 11/08/2020   Lab Results  Component Value Date   IRON 41 08/26/2018   TIBC 325 08/26/2018   FERRITIN 22 08/26/2018    Obesity Behavioral Intervention:   Approximately 15 minutes were spent on the discussion below.  ASK: We discussed the diagnosis of obesity with Kiara Brown today and Kiara Brown agreed to give [place] permission to discuss obesity behavioral modification therapy today.  ASSESS: Kiara Brown has the diagnosis of obesity and her BMI today is 41.9. Kiara Brown is in the action stage of change.   ADVISE: Kiara Brown was educated on the multiple health risks of obesity as well as the benefit of weight loss to improve her health. She was advised of the need for long term treatment and the importance of lifestyle modifications to improve her current health and to decrease her risk of future health problems.  AGREE: Multiple dietary modification options and treatment options were discussed and Kiara Brown agreed to follow the recommendations documented in the above note.  ARRANGE: Kiara Brown was educated on the importance of frequent visits to treat obesity as outlined per CMS and USPSTF  guidelines and agreed to schedule her next follow up appointment today.  Attestation Statements:   Reviewed by clinician on day of visit: allergies, medications, problem list, medical history, surgical history, family history, social history, and previous encounter notes.   Wilhemena Durie, am acting as transcriptionist for [employer], DO.  I have reviewed the above documentation for accuracy and completeness, and I agree with the above. Kiara Brown, D.O.  The [place] was signed into law in 2016 which includes the topic of electronic health records.  This provides immediate access to information in MyChart.  This includes consultation notes, operative notes, office notes, lab results and pathology reports.  If you have any questions about what you read please let us know at your next visit so we can discuss your concerns and take corrective action if need be.  We are right here with you.

## 2021-08-28 ENCOUNTER — Telehealth: Payer: Self-pay | Admitting: Oncology

## 2021-08-28 NOTE — Telephone Encounter (Signed)
Rescheduled per 10/3 sch msg, pt was called and confirmed appt

## 2021-09-02 ENCOUNTER — Other Ambulatory Visit (INDEPENDENT_AMBULATORY_CARE_PROVIDER_SITE_OTHER): Payer: Self-pay | Admitting: Family Medicine

## 2021-09-02 DIAGNOSIS — J3089 Other allergic rhinitis: Secondary | ICD-10-CM

## 2021-09-07 ENCOUNTER — Inpatient Hospital Stay: Payer: Medicare Other

## 2021-09-07 ENCOUNTER — Inpatient Hospital Stay: Payer: Medicare Other | Admitting: Oncology

## 2021-09-13 ENCOUNTER — Ambulatory Visit (INDEPENDENT_AMBULATORY_CARE_PROVIDER_SITE_OTHER): Payer: Medicare Other | Admitting: Adult Health

## 2021-10-11 ENCOUNTER — Ambulatory Visit (INDEPENDENT_AMBULATORY_CARE_PROVIDER_SITE_OTHER): Payer: Medicare Other | Admitting: Family Medicine

## 2021-10-23 DIAGNOSIS — N760 Acute vaginitis: Secondary | ICD-10-CM | POA: Diagnosis not present

## 2021-10-23 DIAGNOSIS — Z6841 Body Mass Index (BMI) 40.0 and over, adult: Secondary | ICD-10-CM | POA: Diagnosis not present

## 2021-10-23 DIAGNOSIS — Z01419 Encounter for gynecological examination (general) (routine) without abnormal findings: Secondary | ICD-10-CM | POA: Diagnosis not present

## 2021-10-23 DIAGNOSIS — Z124 Encounter for screening for malignant neoplasm of cervix: Secondary | ICD-10-CM | POA: Diagnosis not present

## 2021-11-06 ENCOUNTER — Other Ambulatory Visit: Payer: Self-pay | Admitting: *Deleted

## 2021-11-06 DIAGNOSIS — C50911 Malignant neoplasm of unspecified site of right female breast: Secondary | ICD-10-CM

## 2021-11-06 NOTE — Progress Notes (Signed)
[facility]  [phone] Fax:([phone]     ID: RAELA BOHL DOB: [birth year]  MR#: [id number]  ZRZ#:[id number]  Patient Care Team: Zoila Shutter, MD as PCP - General (Internal Medicine) Glenna Fellows, MD (Inactive) as Consulting Physician (General Surgery) Muadh Creasy, Valentino Hue, MD as Consulting Physician (Oncology) Antony Blackbird, MD as Consulting Physician (Radiation Oncology) Coralyn Pear, MD as Referring Physician (Internal Medicine) Bensimhon, Bevelyn Buckles, MD as Consulting Physician (Cardiology) Annice Pih., MD as Consulting Physician (Urology) Allena Katz, Janus Delene Ruffini, MD as Consulting Physician (Pain Medicine) OTHER MD:  CHIEF COMPLAINT: 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  She continues on anastrozole with good tolerance.  Hot flashes are minimal.  She is no longer having problems with urinary tract infections which may have been related related to vaginal dryness issues  Her most recent done density testing was performed on 06/13/2018. This showed a T-score of -0.5, which is considered normal.  She was referred to Dr. Allena Katz in pain management on 03/07/2020.  She has received steroid injections, most recently 04/20/2021.  She is also being followed by Dr. Sabino Gasser at [facility] as needed for recurrent UTIs.   REVIEW OF SYSTEMS: Glena Norfolk tells me her back hurts too much and so she is not exercising presently.  She had her 2 sons daughters along her granddaughter and boyfriend over Thanksgiving's and enjoyed that greatly.  A detailed review of systems was otherwise stable   COVID 19 VACCINATION STATUS: Status post Pfizer x2+ booster September 2021 MI: Also had COVID August 2022   BREAST CANCER HISTORY: From the original intake note:  "[name]" herself noted a change  around her nipple on the right and brought it to medical attention. She was set up for bilateral diagnostic mammography with tomography and right breast ultrasonography at [facility] 05/23/2017. The breast density was category C. In the right breast upper inner quadrant there was a 2.5 cm area of grouped pleomorphic calcifications correlating with the palpable mass. Also there was a 1.2 cm macrolobulated mass in the upper inner quadrant also in the palpable region. These masses were identifiable by ultrasound with the same dimensions. The right axilla was sonographically negative.  On 05/27/2017 the patient underwent biopsy of the 2 masses in question. 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  The patient's subsequent history is as detailed below.   PAST MEDICAL HISTORY: Past Medical History:  Diagnosis Date   Anemia    Arthritis    osteo, rheumatoid   Back pain    Breast cancer (HCC)    Cancer (HCC)    Depression 02/22/2020   Diabetes (HCC)    Diverticulosis    Dysrhythmia    hx rapid heartbeat, no cardiologist   Family history of breast cancer    Family history of prostate cancer    Fibromyalgia    peripheral neuropathy   Fibromyalgia    Fibromyalgia    Gallbladder problem    GERD (gastroesophageal reflux disease)  H/O hiatal hernia    Hyperlipidemia    Joint pain    Lower extremity edema    Obesity    Osteoarthritis    PONV (postoperative nausea and vomiting)    "THROAT WITH EXTREME [id number]"1275, ulnar nerve surgery   Rheumatoid arthritis (HCC)    RLS (restless legs syndrome)     Sciatica    Shortness of breath dyspnea    with exertion   Slipped disc in neck    UTI (lower urinary tract infection)     PAST SURGICAL HISTORY: Past Surgical History:  Procedure Laterality Date   ABDOMINAL HYSTERECTOMY     APPENDECTOMY     back injection      July 2018- x1   BREAST SURGERY Left 1986   biopsy, Recent 05/2017 at [facility]- had biopsy on R breast- malignant    CARDIAC CATHETERIZATION  2009   "no blockage per pt"   CHOLECYSTECTOMY     CYSTOSCOPY WITH BIOPSY     INCONTINENCE SURGERY     IRRIGATION AND DEBRIDEMENT ABSCESS Right 01/28/2018   Procedure: IRRIGATION AND DEBRIDEMENT AXILLARY ABSCESS;  Surgeon: Excell Seltzer, MD;  Location: [facility];  Service: General;  Laterality: Right;   JOINT REPLACEMENT     KNEE ARTHROSCOPY     bilateral   KNEE ARTHROSCOPY Left 03/17/2014   Procedure: ARTHROSCOPY LEFT KNEE WITH SYNOVECTOMY;  Surgeon: Gearlean Alf, MD;  Location: [facility];  Service: Orthopedics;  Laterality: Left;   PORTACATH PLACEMENT Left 07/02/2017   Procedure: INSERTION PORT-A-CATH;  Surgeon: Excell Seltzer, MD;  Location: [place];  Service: General;  Laterality: Left;   RIGHT/LEFT HEART CATH AND CORONARY ANGIOGRAPHY N/A 08/30/2017   Procedure: RIGHT/LEFT HEART CATH AND CORONARY ANGIOGRAPHY;  Surgeon: Larey Dresser, MD;  Location: [place] [facility];  Service: Cardiovascular;  Laterality: N/A;   ROTATOR CUFF REPAIR     right   SIMPLE MASTECTOMY WITH AXILLARY SENTINEL NODE BIOPSY Right 07/02/2017   Procedure: RIGHT TOTAL  MASTECTOMY WITH AXILLARY SENTINEL LYMPH  NODE BIOPSY;  Surgeon: Excell Seltzer, MD;  Location: [place];  Service: General;  Laterality: Right;   TOTAL KNEE ARTHROPLASTY  08/04/2012   Procedure: TOTAL KNEE ARTHROPLASTY;  Surgeon: Gearlean Alf, MD;  Location: [facility];  Service: Orthopedics;  Laterality: Left;   TOTAL KNEE ARTHROPLASTY Right 04/11/2015   Procedure: RIGHT TOTAL KNEE ARTHROPLASTY;  Surgeon: Gaynelle Arabian, MD;  Location: [facility];   Service: Orthopedics;  Laterality: Right;   ulnar nerve surgery Right 1986    FAMILY HISTORY Family History  Problem Relation Age of Onset   Hypertension Mother    Stroke Mother    Breast cancer Mother 53       died at 63   Diabetes Mother    Hyperlipidemia Mother    Heart disease Mother    Cancer Mother    Obesity Mother    Hypertension Father    Heart attack Father        died at 91   Sudden death Father    Hypertension Brother        MI   Prostate cancer Brother 55   Hypertension Brother        MI   Lung cancer Brother        possible lung cancer, spot found on lung being monitored   Hypertension Brother        MI   Non-Hodgkin's lymphoma Sister 60   Breast cancer Other 38  is currently 23   Breast cancer Other 5   Cancer Cousin 62       type of cancer unk.    Cancer Other        type unknown, eye/face    Cancer Other 62       died from cancer in his 64's, type unknown  The patient's father died from a heart attack at age 45. The patient's mother was diagnosed with breast cancer at age 20 and died at age 50. The patient had 5 brothers, 3 sisters. One sister had breast cancer at age 54. One brother was diagnosed with prostate cancer at age 38. One Brother died from lung cancer at age 73. One sister had non-Hodgkin's lymphoma diagnosed at age 91.    GYNECOLOGIC HISTORY:  No LMP recorded. Patient has had a hysterectomy.  menarche age 61, first live birth age 19. All 3 of her births were premature including 1 set of twins. One of the twins died a few hours after birth. She underwent hysterectomy in 1981 with unilateral salpingo-oophorectomy. 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  She has led groups as far away as [place] and [place] and has led many in the [place] cruises as well.  Her husband, Berneta Sages began a business installing fences  which is now carried on by his son Dorothea Ogle. Berneta Sages is also the church organist. Son, Darnelle Maffucci is a truck Geophysicist/field seismologist in [employer]. The patient has one grandchild. She is a Tourist information centre manager.     ADVANCED DIRECTIVES: In place. The patient's husband is her healthcare power of attorney   HEALTH MAINTENANCE: Social History   Tobacco Use   Smoking status: Never   Smokeless tobacco: Never  Substance Use Topics   Alcohol use: No   Drug use: No     Colonoscopy:2017 / [place]   PAP:  Bone density: 2019/ [place] normal   Allergies  Allergen Reactions   Gabapentin Anxiety   Pyridium [Phenazopyridine Hcl]     Nausea  Vomiting diarrhea   Atorvastatin Other (See Comments)    myalgias    Duloxetine Other (See Comments)    UNSPECIFIED REACTION    Nsaids Rash   Oseltamivir Diarrhea and Other (See Comments)    Abdominal pain     Current Outpatient Medications  Medication Sig Dispense Refill   acetaminophen (TYLENOL) 500 MG tablet Take 500 mg by mouth every 6 (six) hours as needed for moderate pain or headache.      anastrozole (ARIMIDEX) 1 MG tablet Take 1 tablet (1 mg total) by mouth daily. 90 tablet 4   aspirin EC 81 MG tablet Take 81 mg by mouth daily after breakfast.      atenolol (TENORMIN) 50 MG tablet Take 50 mg by mouth daily before breakfast.      buPROPion (WELLBUTRIN XL) 150 MG 24 hr tablet Take 150 mg by mouth daily before breakfast.     Docusate Calcium (STOOL SOFTENER PO) Take by mouth as needed.     ferrous sulfate 325 (65 FE) MG tablet Take 1 tablet (325 mg total) by mouth 2 (two) times daily with a meal. (Patient taking differently: Take 325 mg by mouth daily after breakfast.) 60 tablet 0   fexofenadine (ALLEGRA) 180 MG tablet Take 1 tablet (180 mg total) by mouth daily. 30 tablet 0   fluticasone (FLONASE) 50 MCG/ACT nasal spray Place  2 sprays into both nostrils daily. 16 g 0   folic acid (FOLVITE) 1 MG tablet Take 1 mg by mouth daily after breakfast.      Ibuprofen 200 MG CAPS Take by  mouth daily as needed.     metFORMIN (GLUCOPHAGE) 500 MG tablet Take 1 tablet (500 mg total) by mouth daily. Take 1/2 of pill by mouth every morning with food. (Patient not taking: Reported on 08/21/2021) 30 tablet 0   Meth-Hyo-M Bl-Na Phos-Ph Sal (URO-MP PO) Take by mouth as needed.     omeprazole (PRILOSEC) 40 MG capsule Take 40 mg by mouth every morning.     traMADol (ULTRAM) 50 MG tablet Take 50 mg by mouth every 6 (six) hours as needed for moderate pain or severe pain.      traZODone (DESYREL) 50 MG tablet Take 50 mg by mouth at bedtime.      Vitamin D, Ergocalciferol, (DRISDOL) 1.25 MG (50000 UNIT) CAPS capsule Take 1 capsule (50,000 Units total) by mouth every 7 (seven) days. 12 capsule 0   No current facility-administered medications for this visit.    OBJECTIVE: White woman who appears stated age  Vitals:   11/07/21 1318  BP: (!) 180/50  Pulse: 70  Resp: 18  Temp: 97.9 F (36.6 C)  SpO2: 97%      Body mass index is 43.18 kg/m.   Wt Readings from Last 3 Encounters:  11/07/21 236 lb 1.6 oz (107.1 kg)  08/21/21 229 lb (103.9 kg)  08/01/21 230 lb (104.3 kg)   Sclerae unicteric, EOMs intact Wearing a mask No cervical or supraclavicular adenopathy Lungs no rales or rhonchi Heart regular rate and rhythm Abd soft, obese, nontender, positive bowel sounds MSK no focal spinal tenderness, no upper extremity lymphedema Neuro: nonfocal, well oriented, appropriate affect Breasts: The right breast is status postmastectomy.  There is no evidence of chest wall recurrence.  The left breast and both axillae are benign.   LAB RESULTS:  CMP     Component Value Date/Time   NA 136 11/08/2020 1347   NA 131 (L) 09/06/2017 1018   K 4.0 11/08/2020 1347   K 4.2 09/06/2017 1018   CL 102 11/08/2020 1347   CO2 27 11/08/2020 1347   CO2 25 09/06/2017 1018   GLUCOSE 146 (H) 11/08/2020 1347   GLUCOSE 187 (H) 09/06/2017 1018   BUN 16 11/08/2020 1347   BUN 11.2 09/06/2017 1018   CREATININE  1.06 (H) 11/08/2020 1347   CREATININE 1.04 02/21/2018 1139   CREATININE 1.2 (H) 09/06/2017 1018   CALCIUM 9.2 11/08/2020 1347   CALCIUM 9.3 09/06/2017 1018   PROT 7.1 11/08/2020 1347   PROT 6.9 09/06/2017 1018   ALBUMIN 3.4 (L) 11/08/2020 1347   ALBUMIN 3.4 (L) 09/06/2017 1018   AST 12 (L) 11/08/2020 1347   AST 20 09/06/2017 1018   ALT 13 11/08/2020 1347   ALT 14 09/06/2017 1018   ALKPHOS 116 11/08/2020 1347   ALKPHOS 103 09/06/2017 1018   BILITOT 0.3 11/08/2020 1347   BILITOT 0.57 09/06/2017 1018   GFRNONAA 53 (L) 11/08/2020 1347   GFRNONAA 51 (L) 02/21/2018 1139   GFRAA >60 02/24/2020 0536   GFRAA 59 (L) 02/21/2018 1139    No results found for: TOTALPROTELP, ALBUMINELP, A1GS, A2GS, BETS, BETA2SER, GAMS, MSPIKE, SPEI  No results found for: KPAFRELGTCHN, LAMBDASER, KAPLAMBRATIO  Lab Results  Component Value Date   WBC 8.2 11/07/2021   NEUTROABS 5.3 11/07/2021   HGB 10.4 (L) 11/07/2021  HCT 33.5 (L) 11/07/2021   MCV 81.1 11/07/2021   PLT 346 11/07/2021      Chemistry      Component Value Date/Time   NA 136 11/08/2020 1347   NA 131 (L) 09/06/2017 1018   K 4.0 11/08/2020 1347   K 4.2 09/06/2017 1018   CL 102 11/08/2020 1347   CO2 27 11/08/2020 1347   CO2 25 09/06/2017 1018   BUN 16 11/08/2020 1347   BUN 11.2 09/06/2017 1018   CREATININE 1.06 (H) 11/08/2020 1347   CREATININE 1.04 02/21/2018 1139   CREATININE 1.2 (H) 09/06/2017 1018      Component Value Date/Time   CALCIUM 9.2 11/08/2020 1347   CALCIUM 9.3 09/06/2017 1018   ALKPHOS 116 11/08/2020 1347   ALKPHOS 103 09/06/2017 1018   AST 12 (L) 11/08/2020 1347   AST 20 09/06/2017 1018   ALT 13 11/08/2020 1347   ALT 14 09/06/2017 1018   BILITOT 0.3 11/08/2020 1347   BILITOT 0.57 09/06/2017 1018       No results found for: LABCA2  No components found for: [id number]  No results for input(s): INR in the last 168 hours.  Urinalysis    Component Value Date/Time   COLORURINE AMBER (A) 02/21/2020 1755    APPEARANCEUR CLEAR 02/21/2020 1755   LABSPEC 1.014 02/21/2020 1755   PHURINE 5.0 02/21/2020 1755   GLUCOSEU NEGATIVE 02/21/2020 1755   HGBUR MODERATE (A) 02/21/2020 1755   BILIRUBINUR NEGATIVE 02/21/2020 1755   KETONESUR NEGATIVE 02/21/2020 1755   PROTEINUR NEGATIVE 02/21/2020 1755   UROBILINOGEN 0.2 04/06/2015 1332   NITRITE POSITIVE (A) 02/21/2020 1755   LEUKOCYTESUR MODERATE (A) 02/21/2020 1755    STUDIES: No results found.   ELIGIBLE FOR AVAILABLE RESEARCH PROTOCOL: No  ASSESSMENT: 80 y.o. [place] woman status post right breast upper inner quadrant biopsy 2 on 05/27/2017 showing a clinical T1c N0, stage IA invasive ductal carcinoma, grade 3, estrogen and progesterone receptor positive, HER-2 amplified, with an MIB-1 of 60%  (1) genetics testing 07/14/2017 through the Multi-Cancer panel + Breast Cancer Panel found no deleterious mutations in  ALK, APC,AKT1, ATM, AXIN2,BAP1,  BARD1, BLM, BMPR1A, BRCA1, BRCA2, BRIP1, CASR, CDC73, CDH1, CDK4, CDKN1B, CDKN1C, CDKN2A (p14ARF), CDKN2A (p16INK4a), CEBPA, CHEK2, CTNNA1, DICER1, DIS3L2, EGFR (c.2369C>T, p.Thr790Met variant only), EPCAM (Deletion/duplication testing only), FH, FAM175A, FLCN, GATA2, GPC3, GREM1 (Promoter region deletion/duplication testing only), HOXB13 (c.251G>A, p.Gly84Glu), HRAS, KIT, MAX, MEN1, MET, MITF (c.952G>A, p.Glu318Lys variant only), MLH1, MSH2, MSH3, MSH6, MUTYH, NBN, NF1, NF2, NTHL1, PALB2, PDGFRA, PHOX2B, PMS2, POLD1, POLE, POT1, PRKAR1A, PTCH1, PTEN, RAD50, RAD51C, RAD51D, RB1, RECQL4, RET, RUNX1, SDHAF2, SDHA (sequence changes only), SDHB, SDHC, SDHD, SMAD4, SMARCA4, SMARCB1, SMARCE1, STK11, SUFU, TERC, TERT, TMEM127, TP53, TSC1, TSC2, VHL, WRN, WT1, FANCC, MRE11, PIK3CA, RINT1, XRCC2.  (a)  3 Variants of Uncertain significance (VUS) were identified.  BRCA1 c.2551G>A (p.Glu851Lys)----CHEK2 c.679G>A (p.Gly227Arg).  The CHECK 2 variant has been reported as possibly mosaic--- SDHA c.704T>C (p.Ile235Thr).  (2)  anastrozole started 06/05/2017, to be continued and minimum of 5 years   (a) bone density 06/03/2018 showed a T score of -0.5 (normal).  (3) status post right mastectomy and sentinel lymph node sampling 07/02/2017 for an mpT2 pN0, stage IB invasive ductal carcinoma, grade 3, with negative margins.  (a) the patient opted against reconstruction  (4) planned trastuzumab for 6 months started 08/09/2017, discontinued after one dose with significant reaction  (a) baseline echocardiogram 06/19/2017 shows an excellent ejection fraction  (b) Echocardiogram 09/16/2017 showed an ejection fraction of 60-65%  (  5) started lapatinib 12/02/2017-- continued 6 months (completed July 2019)  (a) echocardiogram 01/21/2018 shows an ejection fraction in the 60-65%.  (b) echocardiogram 06/02/2018 showed an ejection fraction in the 55-60% range   PLAN: [name] is now 4-1/2 years out from definitive surgery for her breast cancer with no evidence of disease recurrence.  This is very favorable.  Even though she has significant arthritis and problems and her weight also is an issue I have encouraged her to remain as active as possible.  Water aerobics and walking in a pool would be excellent choices for her.  The plan is to continue the anastrozole through August 2023 at which time she will have completed her 5 years.  She will have a choice between "graduating" from follow-up here or continuing to see [place] on a once a year basis.  She tells me she is leaning towards the continuing follow-up idea  She knows to call for any other issue that may develop before the next visit  Total encounter time 20 minutes.* Total encounter time 20 minutes.   Sruti Ayllon, Virgie Dad, MD  11/07/21 1:21 PM Medical Oncology and Hematology [facility] [address] [place], [place] [address] Tel. [phone]    Fax. [phone]   I, Wilburn Mylar, am acting as scribe for Dr. Virgie Dad. Jahzir Strohmeier.  I, Lurline Del MD,  have reviewed the above documentation for accuracy and completeness, and I agree with the above.   *Total Encounter Time as defined by the Centers for Medicare and Medicaid Services includes, in addition to the face-to-face time of a patient visit (documented in the note above) non-face-to-face time: obtaining and reviewing outside history, ordering and reviewing medications, tests or procedures, care coordination (communications with other health care professionals or caregivers) and documentation in the medical record.

## 2021-11-07 ENCOUNTER — Inpatient Hospital Stay (HOSPITAL_BASED_OUTPATIENT_CLINIC_OR_DEPARTMENT_OTHER): Payer: Medicare Other | Admitting: Oncology

## 2021-11-07 ENCOUNTER — Other Ambulatory Visit: Payer: Self-pay

## 2021-11-07 ENCOUNTER — Inpatient Hospital Stay: Payer: Medicare Other | Attending: Oncology

## 2021-11-07 VITALS — BP 180/50 | HR 70 | Temp 97.9°F | Resp 18 | Ht 62.0 in | Wt 236.1 lb

## 2021-11-07 DIAGNOSIS — Z807 Family history of other malignant neoplasms of lymphoid, hematopoietic and related tissues: Secondary | ICD-10-CM | POA: Diagnosis not present

## 2021-11-07 DIAGNOSIS — Z79811 Long term (current) use of aromatase inhibitors: Secondary | ICD-10-CM | POA: Diagnosis not present

## 2021-11-07 DIAGNOSIS — C50911 Malignant neoplasm of unspecified site of right female breast: Secondary | ICD-10-CM

## 2021-11-07 DIAGNOSIS — Z170 Estrogen receptor positive status [ER+]: Secondary | ICD-10-CM | POA: Insufficient documentation

## 2021-11-07 DIAGNOSIS — Z79899 Other long term (current) drug therapy: Secondary | ICD-10-CM | POA: Diagnosis not present

## 2021-11-07 DIAGNOSIS — Z7984 Long term (current) use of oral hypoglycemic drugs: Secondary | ICD-10-CM | POA: Diagnosis not present

## 2021-11-07 DIAGNOSIS — Z9011 Acquired absence of right breast and nipple: Secondary | ICD-10-CM | POA: Insufficient documentation

## 2021-11-07 DIAGNOSIS — Z9079 Acquired absence of other genital organ(s): Secondary | ICD-10-CM | POA: Insufficient documentation

## 2021-11-07 DIAGNOSIS — Z801 Family history of malignant neoplasm of trachea, bronchus and lung: Secondary | ICD-10-CM | POA: Diagnosis not present

## 2021-11-07 DIAGNOSIS — Z803 Family history of malignant neoplasm of breast: Secondary | ICD-10-CM | POA: Insufficient documentation

## 2021-11-07 DIAGNOSIS — Z90710 Acquired absence of both cervix and uterus: Secondary | ICD-10-CM | POA: Diagnosis not present

## 2021-11-07 DIAGNOSIS — C50211 Malignant neoplasm of upper-inner quadrant of right female breast: Secondary | ICD-10-CM | POA: Diagnosis not present

## 2021-11-07 DIAGNOSIS — Z90721 Acquired absence of ovaries, unilateral: Secondary | ICD-10-CM | POA: Insufficient documentation

## 2021-11-07 DIAGNOSIS — Z7982 Long term (current) use of aspirin: Secondary | ICD-10-CM | POA: Diagnosis not present

## 2021-11-07 LAB — CBC WITH DIFFERENTIAL (CANCER CENTER ONLY)
Abs Immature Granulocytes: 0.02 10*3/uL (ref 0.00–0.07)
Basophils Absolute: 0.1 10*3/uL (ref 0.0–0.1)
Basophils Relative: 1 %
Eosinophils Absolute: 0.1 10*3/uL (ref 0.0–0.5)
Eosinophils Relative: 1 %
HCT: 33.5 % — ABNORMAL LOW (ref 36.0–46.0)
Hemoglobin: 10.4 g/dL — ABNORMAL LOW (ref 12.0–15.0)
Immature Granulocytes: 0 %
Lymphocytes Relative: 26 %
Lymphs Abs: 2.2 10*3/uL (ref 0.7–4.0)
MCH: 25.2 pg — ABNORMAL LOW (ref 26.0–34.0)
MCHC: 31 g/dL (ref 30.0–36.0)
MCV: 81.1 fL (ref 80.0–100.0)
Monocytes Absolute: 0.6 10*3/uL (ref 0.1–1.0)
Monocytes Relative: 7 %
Neutro Abs: 5.3 10*3/uL (ref 1.7–7.7)
Neutrophils Relative %: 65 %
Platelet Count: 346 10*3/uL (ref 150–400)
RBC: 4.13 MIL/uL (ref 3.87–5.11)
RDW: 15 % (ref 11.5–15.5)
WBC Count: 8.2 10*3/uL (ref 4.0–10.5)
nRBC: 0 % (ref 0.0–0.2)

## 2021-11-07 LAB — CMP (CANCER CENTER ONLY)
ALT: 10 U/L (ref 0–44)
AST: 13 U/L — ABNORMAL LOW (ref 15–41)
Albumin: 3.6 g/dL (ref 3.5–5.0)
Alkaline Phosphatase: 107 U/L (ref 38–126)
Anion gap: 9 (ref 5–15)
BUN: 20 mg/dL (ref 8–23)
CO2: 24 mmol/L (ref 22–32)
Calcium: 8.9 mg/dL (ref 8.9–10.3)
Chloride: 104 mmol/L (ref 98–111)
Creatinine: 1.1 mg/dL — ABNORMAL HIGH (ref 0.44–1.00)
GFR, Estimated: 51 mL/min — ABNORMAL LOW (ref >60)
Glucose, Bld: 185 mg/dL — ABNORMAL HIGH (ref 70–99)
Potassium: 4.4 mmol/L (ref 3.5–5.1)
Sodium: 137 mmol/L (ref 135–145)
Total Bilirubin: 0.4 mg/dL (ref 0.3–1.2)
Total Protein: 7.1 g/dL (ref 6.5–8.1)

## 2021-12-18 DIAGNOSIS — M48062 Spinal stenosis, lumbar region with neurogenic claudication: Secondary | ICD-10-CM | POA: Diagnosis not present

## 2021-12-25 ENCOUNTER — Other Ambulatory Visit: Payer: Self-pay

## 2021-12-25 MED ORDER — ANASTROZOLE 1 MG PO TABS: 1.0000 mg | ORAL_TABLET | Freq: Every day | ORAL | 2 refills | Status: DC

## 2022-01-12 DIAGNOSIS — C50211 Malignant neoplasm of upper-inner quadrant of right female breast: Secondary | ICD-10-CM | POA: Diagnosis not present

## 2022-01-12 DIAGNOSIS — M0579 Rheumatoid arthritis with rheumatoid factor of multiple sites without organ or systems involvement: Secondary | ICD-10-CM | POA: Diagnosis not present

## 2022-01-12 DIAGNOSIS — I110 Hypertensive heart disease with heart failure: Secondary | ICD-10-CM | POA: Diagnosis not present

## 2022-01-12 DIAGNOSIS — I5032 Chronic diastolic (congestive) heart failure: Secondary | ICD-10-CM | POA: Diagnosis not present

## 2022-01-12 DIAGNOSIS — I10 Essential (primary) hypertension: Secondary | ICD-10-CM | POA: Diagnosis not present

## 2022-01-12 DIAGNOSIS — D649 Anemia, unspecified: Secondary | ICD-10-CM | POA: Diagnosis not present

## 2022-01-12 DIAGNOSIS — Z171 Estrogen receptor negative status [ER-]: Secondary | ICD-10-CM | POA: Diagnosis not present

## 2022-01-12 DIAGNOSIS — E1142 Type 2 diabetes mellitus with diabetic polyneuropathy: Secondary | ICD-10-CM | POA: Diagnosis not present

## 2022-01-12 DIAGNOSIS — N390 Urinary tract infection, site not specified: Secondary | ICD-10-CM | POA: Diagnosis not present

## 2022-01-12 DIAGNOSIS — I2510 Atherosclerotic heart disease of native coronary artery without angina pectoris: Secondary | ICD-10-CM | POA: Diagnosis not present

## 2022-01-12 DIAGNOSIS — F3342 Major depressive disorder, recurrent, in full remission: Secondary | ICD-10-CM | POA: Diagnosis not present

## 2022-01-17 DIAGNOSIS — I5032 Chronic diastolic (congestive) heart failure: Secondary | ICD-10-CM | POA: Diagnosis not present

## 2022-01-17 DIAGNOSIS — Z7689 Persons encountering health services in other specified circumstances: Secondary | ICD-10-CM | POA: Diagnosis not present

## 2022-01-17 DIAGNOSIS — R079 Chest pain, unspecified: Secondary | ICD-10-CM | POA: Diagnosis not present

## 2022-01-17 DIAGNOSIS — I2510 Atherosclerotic heart disease of native coronary artery without angina pectoris: Secondary | ICD-10-CM | POA: Diagnosis not present

## 2022-01-17 DIAGNOSIS — E782 Mixed hyperlipidemia: Secondary | ICD-10-CM | POA: Diagnosis not present

## 2022-01-21 DIAGNOSIS — I10 Essential (primary) hypertension: Secondary | ICD-10-CM | POA: Diagnosis not present

## 2022-01-21 DIAGNOSIS — N3001 Acute cystitis with hematuria: Secondary | ICD-10-CM | POA: Diagnosis not present

## 2022-01-21 DIAGNOSIS — R350 Frequency of micturition: Secondary | ICD-10-CM | POA: Diagnosis not present

## 2022-01-25 DIAGNOSIS — I2510 Atherosclerotic heart disease of native coronary artery without angina pectoris: Secondary | ICD-10-CM | POA: Diagnosis not present

## 2022-02-02 DIAGNOSIS — N390 Urinary tract infection, site not specified: Secondary | ICD-10-CM | POA: Diagnosis not present

## 2022-02-03 DIAGNOSIS — N390 Urinary tract infection, site not specified: Secondary | ICD-10-CM | POA: Diagnosis not present

## 2022-02-03 DIAGNOSIS — B9620 Unspecified Escherichia coli [E. coli] as the cause of diseases classified elsewhere: Secondary | ICD-10-CM | POA: Diagnosis not present

## 2022-02-03 DIAGNOSIS — I10 Essential (primary) hypertension: Secondary | ICD-10-CM | POA: Diagnosis not present

## 2022-02-03 DIAGNOSIS — B370 Candidal stomatitis: Secondary | ICD-10-CM | POA: Diagnosis not present

## 2022-02-03 DIAGNOSIS — R399 Unspecified symptoms and signs involving the genitourinary system: Secondary | ICD-10-CM | POA: Diagnosis not present

## 2022-03-20 DIAGNOSIS — M48062 Spinal stenosis, lumbar region with neurogenic claudication: Secondary | ICD-10-CM | POA: Diagnosis not present

## 2022-03-20 DIAGNOSIS — M5416 Radiculopathy, lumbar region: Secondary | ICD-10-CM | POA: Diagnosis not present

## 2022-03-21 DIAGNOSIS — E782 Mixed hyperlipidemia: Secondary | ICD-10-CM | POA: Diagnosis not present

## 2022-03-21 DIAGNOSIS — I2510 Atherosclerotic heart disease of native coronary artery without angina pectoris: Secondary | ICD-10-CM | POA: Diagnosis not present

## 2022-03-21 DIAGNOSIS — Z7982 Long term (current) use of aspirin: Secondary | ICD-10-CM | POA: Diagnosis not present

## 2022-03-21 DIAGNOSIS — I10 Essential (primary) hypertension: Secondary | ICD-10-CM | POA: Diagnosis not present

## 2022-03-23 DIAGNOSIS — N3010 Interstitial cystitis (chronic) without hematuria: Secondary | ICD-10-CM | POA: Diagnosis not present

## 2022-03-23 DIAGNOSIS — N3281 Overactive bladder: Secondary | ICD-10-CM | POA: Diagnosis not present

## 2022-03-23 DIAGNOSIS — R399 Unspecified symptoms and signs involving the genitourinary system: Secondary | ICD-10-CM | POA: Diagnosis not present

## 2022-03-23 DIAGNOSIS — N3000 Acute cystitis without hematuria: Secondary | ICD-10-CM | POA: Diagnosis not present

## 2022-04-11 DIAGNOSIS — R300 Dysuria: Secondary | ICD-10-CM | POA: Diagnosis not present

## 2022-04-11 DIAGNOSIS — N390 Urinary tract infection, site not specified: Secondary | ICD-10-CM | POA: Diagnosis not present

## 2022-04-25 DIAGNOSIS — R300 Dysuria: Secondary | ICD-10-CM | POA: Diagnosis not present

## 2022-04-25 DIAGNOSIS — R270 Ataxia, unspecified: Secondary | ICD-10-CM | POA: Diagnosis not present

## 2022-04-25 DIAGNOSIS — R55 Syncope and collapse: Secondary | ICD-10-CM | POA: Diagnosis not present

## 2022-04-25 DIAGNOSIS — I959 Hypotension, unspecified: Secondary | ICD-10-CM | POA: Diagnosis not present

## 2022-04-25 DIAGNOSIS — R5383 Other fatigue: Secondary | ICD-10-CM | POA: Diagnosis not present

## 2022-04-25 DIAGNOSIS — E611 Iron deficiency: Secondary | ICD-10-CM | POA: Diagnosis not present

## 2022-04-25 DIAGNOSIS — R42 Dizziness and giddiness: Secondary | ICD-10-CM | POA: Diagnosis not present

## 2022-04-26 DIAGNOSIS — R001 Bradycardia, unspecified: Secondary | ICD-10-CM | POA: Diagnosis not present

## 2022-04-30 ENCOUNTER — Emergency Department (HOSPITAL_BASED_OUTPATIENT_CLINIC_OR_DEPARTMENT_OTHER)
Admission: EM | Admit: 2022-04-30 | Discharge: 2022-04-30 | Disposition: A | Discharge status: Home / Self Care | Payer: Medicare Other | Attending: Student | Admitting: Student

## 2022-04-30 ENCOUNTER — Emergency Department (HOSPITAL_BASED_OUTPATIENT_CLINIC_OR_DEPARTMENT_OTHER): Payer: Medicare Other

## 2022-04-30 ENCOUNTER — Encounter (HOSPITAL_BASED_OUTPATIENT_CLINIC_OR_DEPARTMENT_OTHER): Payer: Self-pay | Admitting: Pediatrics

## 2022-04-30 ENCOUNTER — Other Ambulatory Visit: Payer: Self-pay

## 2022-04-30 DIAGNOSIS — K5900 Constipation, unspecified: Secondary | ICD-10-CM | POA: Diagnosis not present

## 2022-04-30 DIAGNOSIS — I509 Heart failure, unspecified: Secondary | ICD-10-CM | POA: Diagnosis not present

## 2022-04-30 DIAGNOSIS — E871 Hypo-osmolality and hyponatremia: Secondary | ICD-10-CM | POA: Diagnosis not present

## 2022-04-30 DIAGNOSIS — R944 Abnormal results of kidney function studies: Secondary | ICD-10-CM | POA: Diagnosis not present

## 2022-04-30 DIAGNOSIS — I2510 Atherosclerotic heart disease of native coronary artery without angina pectoris: Secondary | ICD-10-CM | POA: Insufficient documentation

## 2022-04-30 DIAGNOSIS — I110 Hypertensive heart disease with heart failure: Secondary | ICD-10-CM | POA: Insufficient documentation

## 2022-04-30 DIAGNOSIS — Z79899 Other long term (current) drug therapy: Secondary | ICD-10-CM | POA: Insufficient documentation

## 2022-04-30 DIAGNOSIS — N281 Cyst of kidney, acquired: Secondary | ICD-10-CM | POA: Diagnosis not present

## 2022-04-30 DIAGNOSIS — E119 Type 2 diabetes mellitus without complications: Secondary | ICD-10-CM | POA: Diagnosis not present

## 2022-04-30 DIAGNOSIS — K5730 Diverticulosis of large intestine without perforation or abscess without bleeding: Secondary | ICD-10-CM | POA: Diagnosis not present

## 2022-04-30 DIAGNOSIS — R1032 Left lower quadrant pain: Secondary | ICD-10-CM | POA: Diagnosis not present

## 2022-04-30 DIAGNOSIS — E878 Other disorders of electrolyte and fluid balance, not elsewhere classified: Secondary | ICD-10-CM | POA: Diagnosis not present

## 2022-04-30 DIAGNOSIS — Z853 Personal history of malignant neoplasm of breast: Secondary | ICD-10-CM | POA: Diagnosis not present

## 2022-04-30 LAB — URINALYSIS, ROUTINE W REFLEX MICROSCOPIC
Bilirubin Urine: NEGATIVE
Glucose, UA: NEGATIVE mg/dL
Ketones, ur: NEGATIVE mg/dL
Nitrite: NEGATIVE
Protein, ur: NEGATIVE mg/dL
Specific Gravity, Urine: 1.015 (ref 1.005–1.030)
pH: 7 (ref 5.0–8.0)

## 2022-04-30 LAB — COMPREHENSIVE METABOLIC PANEL
ALT: 9 U/L (ref 0–44)
AST: 15 U/L (ref 15–41)
Albumin: 3.7 g/dL (ref 3.5–5.0)
Alkaline Phosphatase: 97 U/L (ref 38–126)
Anion gap: 8 (ref 5–15)
BUN: 20 mg/dL (ref 8–23)
CO2: 23 mmol/L (ref 22–32)
Calcium: 8.9 mg/dL (ref 8.9–10.3)
Chloride: 96 mmol/L — ABNORMAL LOW (ref 98–111)
Creatinine, Ser: 1.12 mg/dL — ABNORMAL HIGH (ref 0.44–1.00)
GFR, Estimated: 50 mL/min — ABNORMAL LOW (ref >60)
Glucose, Bld: 141 mg/dL — ABNORMAL HIGH (ref 70–99)
Potassium: 4.3 mmol/L (ref 3.5–5.1)
Sodium: 127 mmol/L — ABNORMAL LOW (ref 135–145)
Total Bilirubin: 0.4 mg/dL (ref 0.3–1.2)
Total Protein: 7.3 g/dL (ref 6.5–8.1)

## 2022-04-30 LAB — CBC WITH DIFFERENTIAL/PLATELET
Abs Immature Granulocytes: 0.04 10*3/uL (ref 0.00–0.07)
Basophils Absolute: 0 10*3/uL (ref 0.0–0.1)
Basophils Relative: 0 %
Eosinophils Absolute: 0.1 10*3/uL (ref 0.0–0.5)
Eosinophils Relative: 1 %
HCT: 33.4 % — ABNORMAL LOW (ref 36.0–46.0)
Hemoglobin: 10.7 g/dL — ABNORMAL LOW (ref 12.0–15.0)
Immature Granulocytes: 0 %
Lymphocytes Relative: 20 %
Lymphs Abs: 2.1 10*3/uL (ref 0.7–4.0)
MCH: 25.2 pg — ABNORMAL LOW (ref 26.0–34.0)
MCHC: 32 g/dL (ref 30.0–36.0)
MCV: 78.6 fL — ABNORMAL LOW (ref 80.0–100.0)
Monocytes Absolute: 0.7 10*3/uL (ref 0.1–1.0)
Monocytes Relative: 7 %
Neutro Abs: 7.4 10*3/uL (ref 1.7–7.7)
Neutrophils Relative %: 72 %
Platelets: 342 10*3/uL (ref 150–400)
RBC: 4.25 MIL/uL (ref 3.87–5.11)
RDW: 16.4 % — ABNORMAL HIGH (ref 11.5–15.5)
WBC: 10.4 10*3/uL (ref 4.0–10.5)
nRBC: 0 % (ref 0.0–0.2)

## 2022-04-30 LAB — URINALYSIS, MICROSCOPIC (REFLEX)

## 2022-04-30 LAB — LIPASE, BLOOD: Lipase: 37 U/L (ref 11–51)

## 2022-04-30 MED ORDER — LIDOCAINE 5 % EX PTCH
1.0000 | MEDICATED_PATCH | CUTANEOUS | Status: DC
Start: 2022-04-30 — End: 2022-04-30
  Administered 2022-04-30: 1 via TRANSDERMAL
  Filled 2022-04-30: qty 1

## 2022-04-30 MED ORDER — ONDANSETRON HCL 4 MG/2ML IJ SOLN
4.0000 mg | Freq: Once | INTRAMUSCULAR | Status: AC
  Administered 2022-04-30: 4 mg via INTRAVENOUS

## 2022-04-30 MED ORDER — MORPHINE SULFATE (PF) 4 MG/ML IV SOLN
4.0000 mg | Freq: Once | INTRAVENOUS | Status: AC
  Administered 2022-04-30: 4 mg via INTRAVENOUS

## 2022-04-30 MED ORDER — IOHEXOL 300 MG/ML  SOLN
100.0000 mL | Freq: Once | INTRAMUSCULAR | Status: AC | PRN
Start: 2022-04-30 — End: 2022-04-30
  Administered 2022-04-30: 100 mL via INTRAVENOUS

## 2022-04-30 MED ORDER — DICYCLOMINE HCL 20 MG PO TABS: 20.0000 mg | ORAL_TABLET | Freq: Two times a day (BID) | ORAL | 0 refills | Status: DC

## 2022-04-30 MED ORDER — POLYETHYLENE GLYCOL 3350 17 G PO PACK
17.0000 g | PACK | Freq: Every day | ORAL | 0 refills | Status: AC
Start: 2022-04-30 — End: ?

## 2022-04-30 NOTE — ED Notes (Signed)
Patient given discharge instructions, all questions answered. Patient in possession of all belongings, directed to the discharge area  

## 2022-04-30 NOTE — ED Provider Notes (Signed)
[facility] EMERGENCY DEPARTMENT Provider Note  CSN: [id number] Arrival date & time: 04/30/22 0931  Chief Complaint(s) Flank Pain  HPI Kiara Brown is a 81 y.o. female with PMH CAD, CHF, HTN, T2DM, RA, HLD, anemia who presents the emergency department for evaluation of left flank pain.  She states that her symptoms began abruptly today and are primarily located in the left flank with minimal radiation.  She states that she has a history of recurrent UTIs and recently finished a course of antibiotics.  She currently denies dysuria, increased frequency, fevers, chest pain, shortness of breath, nausea, vomiting or other systemic symptoms.   Past Medical History Past Medical History:  Diagnosis Date   Anemia    Arthritis    osteo, rheumatoid   Back pain    Breast cancer ([place])    Cancer ([place])    Depression 02/22/2020   Diabetes ([place])    Diverticulosis    Dysrhythmia    hx rapid heartbeat, no cardiologist   Family history of breast cancer    Family history of prostate cancer    Fibromyalgia    peripheral neuropathy   Fibromyalgia    Fibromyalgia    Gallbladder problem    GERD (gastroesophageal reflux disease)    H/O hiatal hernia    Hyperlipidemia    Joint pain    Lower extremity edema    Obesity    Osteoarthritis    PONV (postoperative nausea and vomiting)    "THROAT WITH EXTREME [id number]"6761, ulnar nerve surgery   Rheumatoid arthritis ([place])    RLS (restless legs syndrome)    Sciatica    Shortness of breath dyspnea    with exertion   Slipped disc in neck    UTI (lower urinary tract infection)    Patient Active Problem List   Diagnosis Date Noted   Vitamin D deficiency 07/03/2021   Other chronic pain 07/03/2021   Sepsis secondary to UTI ([place]) 02/22/2020   CAD (coronary artery disease) 02/22/2020   Depression 02/22/2020   Abscess 01/27/2018   Severe obesity ([place]) 12/02/2017   Anemia, normocytic normochromic 08/10/2017   Chest pain 08/10/2017    Essential hypertension 08/10/2017   CKD (chronic kidney disease), 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 Authorizing Provider  acetaminophen (TYLENOL) 500 MG tablet Take 500 mg by mouth every 6 (six) hours as needed for moderate pain or headache.     [provider]  anastrozole (ARIMIDEX) 1 MG tablet Take 1 tablet (1 mg total) by mouth daily. 12/25/21   Benay Pike, MD  aspirin EC 81 MG tablet Take 81 mg by mouth daily after breakfast.     [provider]  atenolol (TENORMIN) 50 MG tablet Take 50 mg by mouth daily before breakfast.     [provider]  buPROPion (WELLBUTRIN XL) 150 MG 24 hr tablet Take 150 mg by mouth daily before breakfast.    [provider]  Docusate Calcium (STOOL SOFTENER PO)  Take by mouth as needed.    [provider]  ferrous sulfate 325 (65 FE) MG tablet Take 1 tablet (325 mg total) by mouth 2 (two) times daily with a meal. Patient taking differently: Take 325 mg by mouth daily after breakfast. 06/15/19   Nandigam, Venia Minks, MD  fexofenadine (ALLEGRA) 180 MG tablet Take 1 tablet (180 mg total) by mouth daily. 08/21/21   Opalski, Neoma Laming, DO  fluticasone (FLONASE) 50 MCG/ACT nasal spray Place 2 sprays into both nostrils daily. 08/01/21   Dennard Nip D, MD  folic acid (FOLVITE) 1 MG tablet Take 1 mg by mouth daily after breakfast.     [provider]  Ibuprofen 200 MG CAPS Take by mouth daily as  needed.    [provider]  metFORMIN (GLUCOPHAGE) 500 MG tablet Take 1 tablet (500 mg total) by mouth daily. Take 1/2 of pill by mouth every morning with food. Patient not taking: Reported on 08/21/2021 08/01/21   Starlyn Skeans, MD  Meth-Hyo-M Barnett Hatter Phos-Ph Sal (URO-MP PO) Take by mouth as needed.    [provider]  omeprazole (PRILOSEC) 40 MG capsule Take 40 mg by mouth every morning.    [provider]  traMADol (ULTRAM) 50 MG tablet Take 50 mg by mouth every 6 (six) hours as needed for moderate pain or severe pain.     [provider]  traZODone (DESYREL) 50 MG tablet Take 50 mg by mouth at bedtime.  04/09/19   [provider]  Vitamin D, Ergocalciferol, (DRISDOL) 1.25 MG (50000 UNIT) CAPS capsule Take 1 capsule (50,000 Units total) by mouth every 7 (seven) days. 05/17/21   Briscoe Deutscher, DO                                                                                                                                    Past Surgical History Past Surgical History:  Procedure Laterality Date   ABDOMINAL HYSTERECTOMY     APPENDECTOMY     back injection      July 2018- x1   BREAST SURGERY Left 1986   biopsy, Recent 05/2017 at [facility]- had biopsy on R breast- malignant    CARDIAC CATHETERIZATION  2009   "no blockage per pt"   CHOLECYSTECTOMY     CYSTOSCOPY WITH BIOPSY     INCONTINENCE SURGERY     IRRIGATION AND DEBRIDEMENT ABSCESS Right 01/28/2018   Procedure: IRRIGATION AND DEBRIDEMENT AXILLARY ABSCESS;  Surgeon: Excell Seltzer, MD;  Location: [facility];  Service: General;  Laterality: Right;   JOINT REPLACEMENT     KNEE ARTHROSCOPY     bilateral   KNEE ARTHROSCOPY Left 03/17/2014   Procedure: ARTHROSCOPY LEFT KNEE WITH SYNOVECTOMY;  Surgeon: Gearlean Alf, MD;  Location: [facility];  Service: Orthopedics;  Laterality: Left;   PORTACATH PLACEMENT Left 07/02/2017   Procedure: INSERTION PORT-A-CATH;  Surgeon: Excell Seltzer, MD;  Location: [place];  Service: General;  Laterality: Left;   RIGHT/LEFT HEART CATH AND CORONARY ANGIOGRAPHY N/A 08/30/2017   Procedure: RIGHT/LEFT HEART CATH AND CORONARY ANGIOGRAPHY;  Surgeon: Larey Dresser, MD;  Location: [facility];  Service: Cardiovascular;  Laterality: N/A;   ROTATOR CUFF REPAIR     right   SIMPLE MASTECTOMY WITH AXILLARY SENTINEL NODE BIOPSY Right 07/02/2017   Procedure: RIGHT TOTAL  MASTECTOMY WITH AXILLARY SENTINEL LYMPH  NODE BIOPSY;  Surgeon: Excell Seltzer, MD;  Location: [facility];  Service: General;  Laterality: Right;   TOTAL KNEE ARTHROPLASTY  08/04/2012   Procedure: TOTAL KNEE ARTHROPLASTY;  Surgeon: Gearlean Alf, MD;  Location: [facility];  Service: Orthopedics;  Laterality: Left;   TOTAL KNEE ARTHROPLASTY Right 04/11/2015   Procedure: RIGHT TOTAL KNEE ARTHROPLASTY;  Surgeon: Gaynelle Arabian, MD;  Location: [facility];  Service: Orthopedics;  Laterality: Right;   ulnar nerve surgery Right 1986   Family History Family History  Problem Relation Age of Onset   Hypertension Mother    Stroke Mother    Breast cancer Mother 67       died at 64   Diabetes Mother    Hyperlipidemia Mother    Heart disease Mother    Cancer Mother    Obesity Mother    Hypertension Father    Heart attack Father        died at 37   Sudden death Father    Hypertension Brother        MI   Prostate cancer Brother 81   Hypertension Brother        MI   Lung cancer Brother        possible lung cancer, spot found on lung being monitored   Hypertension Brother        MI   Non-Hodgkin's lymphoma Sister 75   Breast cancer Other 90       is currently 52   Breast cancer Other 48   Cancer Cousin 62       type of cancer unk.    Cancer Other        type unknown, eye/face    Cancer Other 59       died from cancer in his 59's, type unknown    Social History Social History   Tobacco Use   Smoking status: Never   Smokeless tobacco: Never  Substance Use Topics   Alcohol use: No   Drug use: No    Allergies Gabapentin, Pyridium [phenazopyridine hcl], Atorvastatin, Duloxetine, Nsaids, and Oseltamivir  Review of Systems Review of Systems  Genitourinary:  Positive for flank pain.   Physical Exam Vital Signs  I have reviewed the triage vital signs BP (!) 135/54   Pulse 70   Temp 98.1 F (36.7 C) (Oral)   Resp 17   Ht '5\' 3"'$  (1.6 m)   Wt 104.3 kg   SpO2 100%   BMI 40.74 kg/m   Physical Exam Vitals and nursing note reviewed.  Constitutional:      General: She is not in acute distress.    Appearance: She is well-developed.  HENT:     Head: Normocephalic and atraumatic.  Eyes:     Conjunctiva/sclera: Conjunctivae normal.  Cardiovascular:     Rate and Rhythm: Normal rate and regular rhythm.     Heart sounds: No murmur heard. Pulmonary:     Effort: Pulmonary effort is normal. No respiratory distress.     Breath sounds: Normal breath sounds.  Abdominal:  Palpations: Abdomen is soft.     Tenderness: There is abdominal tenderness. There is left CVA tenderness.  Musculoskeletal:        General: No swelling.     Cervical back: Neck supple.  Skin:    General: Skin is warm and dry.     Capillary Refill: Capillary refill takes less than 2 seconds.  Neurological:     Mental Status: She is alert.  Psychiatric:        Mood and Affect: Mood normal.    ED Results and Treatments Labs (all labs ordered are listed, but only abnormal results are displayed) Labs Reviewed  URINALYSIS, ROUTINE W REFLEX MICROSCOPIC - Abnormal; Notable for the following components:      Result Value   Hgb urine dipstick TRACE (*)    Leukocytes,Ua TRACE (*)    All other components within normal limits  URINALYSIS, MICROSCOPIC (REFLEX) - Abnormal; Notable for the following components:   Bacteria, UA RARE (*)    All other components within normal limits  CBC WITH DIFFERENTIAL/PLATELET  COMPREHENSIVE METABOLIC PANEL  LIPASE, BLOOD                                                                                                                           Radiology No results found.  Pertinent labs & imaging results that were available during my care of the patient were reviewed by me and considered in my medical decision making (see MDM for details).  Medications Ordered in ED Medications - No data to display                                                                                                                                   Procedures Procedures  (including critical care time)  Medical Decision Making / ED Course   This patient presents to the ED for concern of left flank pain, left lower quadrant pain, this involves an extensive number of treatment options, and is a complaint that carries with it a high risk of complications and morbidity.  The differential diagnosis includes nephrolithiasis, pyelonephritis, AAA, constipation, diverticulitis  MDM: Patient seen in the emergency department for evaluation of multiple complaints as described above.  Physical exam reveals tenderness in the left flank and mild tenderness in left lower quadrant but is otherwise unremarkable.  Evaluation with a mild hyponatremia 127, hypochloremia to 96, creatinine 1.12, hemoglobin 10.7 with  an MCV of 78.6, urinalysis unremarkable, lipase normal.  CT abdomen pelvis with large stool burden but no acute intra-abdominal pathology to explain the patient's current symptoms.  Patient has been previously evaluated by her PCPs for unsteadiness on her feet but currently is not displaying this here in the emergency department and I do not think that her current hyponatremia 127 is symptomatic.  However, it may warrant reevaluation in the outpatient setting by her primary care doctors.  Patient will be discharged with a prescription for MiraLAX and was instructed to follow-up with her primary care physician to monitor improvement of symptoms with bowel cleanout.   Additional history obtained: -Additional  history obtained from daughter -External records from outside source obtained and reviewed including: Chart review including previous notes, labs, imaging, consultation notes   Lab Tests: -I ordered, reviewed, and interpreted labs.   The pertinent results include:   Labs Reviewed  URINALYSIS, ROUTINE W REFLEX MICROSCOPIC - Abnormal; Notable for the following components:      Result Value   Hgb urine dipstick TRACE (*)    Leukocytes,Ua TRACE (*)    All other components within normal limits  URINALYSIS, MICROSCOPIC (REFLEX) - Abnormal; Notable for the following components:   Bacteria, UA RARE (*)    All other components within normal limits  CBC WITH DIFFERENTIAL/PLATELET  COMPREHENSIVE METABOLIC PANEL  LIPASE, BLOOD        Imaging Studies ordered: I ordered imaging studies including CT abdomen pelvis I independently visualized and interpreted imaging. I agree with the radiologist interpretation   Medicines ordered and prescription drug management: No orders of the defined types were placed in this encounter.   -I have reviewed the patients home medicines and have made adjustments as needed  Critical interventions none   Cardiac Monitoring: The patient was maintained on a cardiac monitor.  I personally viewed and interpreted the cardiac monitored which showed an underlying rhythm of: NSR  Social Determinants of Health:  Factors impacting patients care include: none   Reevaluation: After the interventions noted above, I reevaluated the patient and found that they have :improved  Co morbidities that complicate the patient evaluation  Past Medical History:  Diagnosis Date   Anemia    Arthritis    osteo, rheumatoid   Back pain    Breast cancer ([place])    Cancer ([place])    Depression 02/22/2020   Diabetes ([place])    Diverticulosis    Dysrhythmia    hx rapid heartbeat, no cardiologist   Family history of breast cancer    Family history of prostate cancer     Fibromyalgia    peripheral neuropathy   Fibromyalgia    Fibromyalgia    Gallbladder problem    GERD (gastroesophageal reflux disease)    H/O hiatal hernia    Hyperlipidemia    Joint pain    Lower extremity edema    Obesity    Osteoarthritis    PONV (postoperative nausea and vomiting)    "THROAT WITH EXTREME [id number]"5993, ulnar nerve surgery   Rheumatoid arthritis ([facility])    RLS (restless legs syndrome)    Sciatica    Shortness of breath dyspnea    with exertion   Slipped disc in neck    UTI (lower urinary tract infection)       Dispostion: I considered admission for this patient, but with overall reassuring work-up, her constipation does not meet inpatient criteria for admission at this time she is safe for discharge with outpatient follow-up.  Final Clinical Impression(s) / ED Diagnoses Final diagnoses:  None     '@PCDICTATION'$ @    Teressa Lower, MD 04/30/22 1528

## 2022-04-30 NOTE — ED Triage Notes (Signed)
C/O left flank pain and cloudy urine; currently on day 5 of 10 of ABX for UTI

## 2022-05-22 DIAGNOSIS — Z87440 Personal history of urinary (tract) infections: Secondary | ICD-10-CM | POA: Diagnosis not present

## 2022-05-24 DIAGNOSIS — E119 Type 2 diabetes mellitus without complications: Secondary | ICD-10-CM | POA: Diagnosis not present

## 2022-05-24 DIAGNOSIS — H0289 Other specified disorders of eyelid: Secondary | ICD-10-CM | POA: Diagnosis not present

## 2022-05-24 DIAGNOSIS — H5203 Hypermetropia, bilateral: Secondary | ICD-10-CM | POA: Diagnosis not present

## 2022-05-24 DIAGNOSIS — Z961 Presence of intraocular lens: Secondary | ICD-10-CM | POA: Diagnosis not present

## 2022-05-24 DIAGNOSIS — H524 Presbyopia: Secondary | ICD-10-CM | POA: Diagnosis not present

## 2022-05-24 DIAGNOSIS — H52203 Unspecified astigmatism, bilateral: Secondary | ICD-10-CM | POA: Diagnosis not present

## 2022-06-18 DIAGNOSIS — M542 Cervicalgia: Secondary | ICD-10-CM | POA: Diagnosis not present

## 2022-06-18 DIAGNOSIS — M533 Sacrococcygeal disorders, not elsewhere classified: Secondary | ICD-10-CM | POA: Diagnosis not present

## 2022-07-04 ENCOUNTER — Encounter (INDEPENDENT_AMBULATORY_CARE_PROVIDER_SITE_OTHER): Payer: Self-pay

## 2022-07-06 ENCOUNTER — Other Ambulatory Visit: Payer: Self-pay | Admitting: *Deleted

## 2022-07-06 DIAGNOSIS — Z170 Estrogen receptor positive status [ER+]: Secondary | ICD-10-CM

## 2022-07-09 ENCOUNTER — Inpatient Hospital Stay: Payer: Medicare Other | Attending: Hematology and Oncology

## 2022-07-09 ENCOUNTER — Encounter: Payer: Self-pay | Admitting: Hematology and Oncology

## 2022-07-09 ENCOUNTER — Inpatient Hospital Stay (HOSPITAL_BASED_OUTPATIENT_CLINIC_OR_DEPARTMENT_OTHER): Payer: Medicare Other | Admitting: Hematology and Oncology

## 2022-07-09 ENCOUNTER — Other Ambulatory Visit: Payer: Self-pay

## 2022-07-09 VITALS — BP 182/60 | HR 62 | Temp 97.2°F | Resp 16 | Wt 235.1 lb

## 2022-07-09 DIAGNOSIS — Z79899 Other long term (current) drug therapy: Secondary | ICD-10-CM | POA: Diagnosis not present

## 2022-07-09 DIAGNOSIS — Z170 Estrogen receptor positive status [ER+]: Secondary | ICD-10-CM | POA: Insufficient documentation

## 2022-07-09 DIAGNOSIS — C50211 Malignant neoplasm of upper-inner quadrant of right female breast: Secondary | ICD-10-CM

## 2022-07-09 DIAGNOSIS — Z9011 Acquired absence of right breast and nipple: Secondary | ICD-10-CM | POA: Insufficient documentation

## 2022-07-09 DIAGNOSIS — Z7982 Long term (current) use of aspirin: Secondary | ICD-10-CM | POA: Insufficient documentation

## 2022-07-09 DIAGNOSIS — Z79811 Long term (current) use of aromatase inhibitors: Secondary | ICD-10-CM | POA: Insufficient documentation

## 2022-07-09 LAB — CMP (CANCER CENTER ONLY)
ALT: 8 U/L (ref 0–44)
AST: 10 U/L — ABNORMAL LOW (ref 15–41)
Albumin: 4 g/dL (ref 3.5–5.0)
Alkaline Phosphatase: 104 U/L (ref 38–126)
Anion gap: 3 — ABNORMAL LOW (ref 5–15)
BUN: 20 mg/dL (ref 8–23)
CO2: 28 mmol/L (ref 22–32)
Calcium: 9.1 mg/dL (ref 8.9–10.3)
Chloride: 102 mmol/L (ref 98–111)
Creatinine: 1.04 mg/dL — ABNORMAL HIGH (ref 0.44–1.00)
GFR, Estimated: 54 mL/min — ABNORMAL LOW (ref >60)
Glucose, Bld: 131 mg/dL — ABNORMAL HIGH (ref 70–99)
Potassium: 4.3 mmol/L (ref 3.5–5.1)
Sodium: 133 mmol/L — ABNORMAL LOW (ref 135–145)
Total Bilirubin: 0.4 mg/dL (ref 0.3–1.2)
Total Protein: 6.9 g/dL (ref 6.5–8.1)

## 2022-07-09 LAB — CBC WITH DIFFERENTIAL (CANCER CENTER ONLY)
Abs Immature Granulocytes: 0.02 10*3/uL (ref 0.00–0.07)
Basophils Absolute: 0.1 10*3/uL (ref 0.0–0.1)
Basophils Relative: 1 %
Eosinophils Absolute: 0.1 10*3/uL (ref 0.0–0.5)
Eosinophils Relative: 1 %
HCT: 30.9 % — ABNORMAL LOW (ref 36.0–46.0)
Hemoglobin: 9.7 g/dL — ABNORMAL LOW (ref 12.0–15.0)
Immature Granulocytes: 0 %
Lymphocytes Relative: 26 %
Lymphs Abs: 2.2 10*3/uL (ref 0.7–4.0)
MCH: 24.8 pg — ABNORMAL LOW (ref 26.0–34.0)
MCHC: 31.4 g/dL (ref 30.0–36.0)
MCV: 79 fL — ABNORMAL LOW (ref 80.0–100.0)
Monocytes Absolute: 0.6 10*3/uL (ref 0.1–1.0)
Monocytes Relative: 8 %
Neutro Abs: 5.6 10*3/uL (ref 1.7–7.7)
Neutrophils Relative %: 64 %
Platelet Count: 340 10*3/uL (ref 150–400)
RBC: 3.91 MIL/uL (ref 3.87–5.11)
RDW: 15.9 % — ABNORMAL HIGH (ref 11.5–15.5)
WBC Count: 8.6 10*3/uL (ref 4.0–10.5)
nRBC: 0 % (ref 0.0–0.2)

## 2022-07-09 NOTE — Progress Notes (Signed)
[place]  [phone] [phone]     ID: Darrick Penna DOB: [birth year]  MR#: [id number]  WIO#:[id number]  Patient Care Team: Burman Freestone, MD as PCP - General (Internal Medicine) Excell Seltzer, MD (Inactive) as Consulting Physician (General Surgery) Magrinat, Virgie Dad, MD (Inactive) as Consulting Physician (Oncology) Gery Pray, MD as Consulting Physician (Radiation Oncology) [place] Crane, MD as Referring Physician (Internal Medicine) Bensimhon, Shaune Pascal, MD as Consulting Physician (Cardiology) Ulyses Southward., MD as Consulting Physician (Urology) Posey Pronto, Janus Jonelle Sidle, MD as Consulting Physician (Pain Medicine)  CHIEF COMPLAINT: 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  She has now completed 5 years of antiestrogen therapy.  She tolerated this very well except for some minor hot flashes.  Her last bone density back in 2019 was normal.  She has not had a repeat bone density since then.  She had her last mammogram in September which was without any concerns for malignancy.  She denies any new complaints today.  She is ready to be done with antiestrogen therapy.  She has not noticed any changes in her breast. Rest of the pertinent 10 point ROS reviewed and negative   COVID 19 VACCINATION STATUS: Status post [place] x2+ booster September 2021 MI: Also had COVID August 2022   BREAST CANCER HISTORY: From the original intake note:  "Kiara Brown" herself noted a change around her nipple on the right and brought it to medical attention. She was set up for bilateral diagnostic mammography with tomography and right breast ultrasonography at [facility] 05/23/2017. The breast density was category C. In the right breast upper inner quadrant there was a 2.5 cm area of grouped pleomorphic  calcifications correlating with the palpable mass. Also there was a 1.2 cm macrolobulated mass in the upper inner quadrant also in the palpable region. These masses were identifiable by ultrasound with the same dimensions. The right axilla was sonographically negative.  On 05/27/2017 the patient underwent biopsy of the 2 masses in question. 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  The patient's subsequent history is as detailed below.   PAST MEDICAL HISTORY: Past Medical History:  Diagnosis Date   Anemia    Arthritis    osteo, rheumatoid   Back pain    Breast cancer ([place])    Cancer ([place])    Depression 02/22/2020   Diabetes ([place])    Diverticulosis    Dysrhythmia    hx rapid heartbeat, no cardiologist   Family history of breast cancer    Family history of prostate cancer    Fibromyalgia    peripheral neuropathy   Fibromyalgia    Fibromyalgia    Gallbladder problem    GERD (gastroesophageal reflux disease)    H/O hiatal hernia    Hyperlipidemia    Joint pain    Lower extremity edema    Obesity    Osteoarthritis    PONV (postoperative nausea and vomiting)    "THROAT WITH EXTREME EQASTMH"9622, ulnar nerve surgery  Rheumatoid arthritis (HCC)    RLS (restless legs syndrome)    Sciatica    Shortness of breath dyspnea    with exertion   Slipped disc in neck    UTI (lower urinary tract infection)     PAST SURGICAL HISTORY: Past Surgical History:  Procedure Laterality Date   ABDOMINAL HYSTERECTOMY     APPENDECTOMY     back injection      July 2018- x1   BREAST SURGERY  Left 1986   biopsy, Recent 05/2017 at [facility]- had biopsy on R breast- malignant    CARDIAC CATHETERIZATION  2009   "no blockage per pt"   CHOLECYSTECTOMY     CYSTOSCOPY WITH BIOPSY     INCONTINENCE SURGERY     IRRIGATION AND DEBRIDEMENT ABSCESS Right 01/28/2018   Procedure: IRRIGATION AND DEBRIDEMENT AXILLARY ABSCESS;  Surgeon: Excell Seltzer, MD;  Location: [facility];  Service: General;  Laterality: Right;   JOINT REPLACEMENT     KNEE ARTHROSCOPY     bilateral   KNEE ARTHROSCOPY Left 03/17/2014   Procedure: ARTHROSCOPY LEFT KNEE WITH SYNOVECTOMY;  Surgeon: Gearlean Alf, MD;  Location: [facility];  Service: Orthopedics;  Laterality: Left;   PORTACATH PLACEMENT Left 07/02/2017   Procedure: INSERTION PORT-A-CATH;  Surgeon: Excell Seltzer, MD;  Location: [place];  Service: General;  Laterality: Left;   RIGHT/LEFT HEART CATH AND CORONARY ANGIOGRAPHY N/A 08/30/2017   Procedure: RIGHT/LEFT HEART CATH AND CORONARY ANGIOGRAPHY;  Surgeon: Larey Dresser, MD;  Location: [place] [facility];  Service: Cardiovascular;  Laterality: N/A;   ROTATOR CUFF REPAIR     right   SIMPLE MASTECTOMY WITH AXILLARY SENTINEL NODE BIOPSY Right 07/02/2017   Procedure: RIGHT TOTAL  MASTECTOMY WITH AXILLARY SENTINEL LYMPH  NODE BIOPSY;  Surgeon: Excell Seltzer, MD;  Location: [place];  Service: General;  Laterality: Right;   TOTAL KNEE ARTHROPLASTY  08/04/2012   Procedure: TOTAL KNEE ARTHROPLASTY;  Surgeon: Gearlean Alf, MD;  Location: [facility];  Service: Orthopedics;  Laterality: Left;   TOTAL KNEE ARTHROPLASTY Right 04/11/2015   Procedure: RIGHT TOTAL KNEE ARTHROPLASTY;  Surgeon: Gaynelle Arabian, MD;  Location: [facility];  Service: Orthopedics;  Laterality: Right;   ulnar nerve surgery Right 1986    FAMILY HISTORY Family History  Problem Relation Age of Onset   Hypertension Mother    Stroke Mother    Breast cancer Mother 16       died at 64   Diabetes Mother    Hyperlipidemia Mother    Heart disease Mother    Cancer  Mother    Obesity Mother    Hypertension Father    Heart attack Father        died at 85   Sudden death Father    Hypertension Brother        MI   Prostate cancer Brother 37   Hypertension Brother        MI   Lung cancer Brother        possible lung cancer, spot found on lung being monitored   Hypertension Brother        MI   Non-Hodgkin's lymphoma Sister 36   Breast cancer Other 13       is currently 58   Breast cancer Other 1   Cancer Cousin 62       type of cancer unk.    Cancer Other        type unknown, eye/face    Cancer Other 21  died from cancer in his 47's, type unknown  The patient's father died from a heart attack at age 13. The patient's mother was diagnosed with breast cancer at age 8 and died at age 39. The patient had 5 brothers, 3 sisters. One sister had breast cancer at age 63. One brother was diagnosed with prostate cancer at age 71. One Brother died from lung cancer at age 54. One sister had non-Hodgkin's lymphoma diagnosed at age 89.    GYNECOLOGIC HISTORY:  No LMP recorded. Patient has had a hysterectomy.  menarche age 18, first live birth age 72. All 3 of her births were premature including 1 set of twins. One of the twins died a few hours after birth. She underwent hysterectomy in 1981 with unilateral salpingo-oophorectomy. 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  She has led groups as far away as [place] and [place] and has led many in the [place] cruises as well.  Her husband, Berneta Sages began a business installing fences which is now carried on by his son Dorothea Ogle. Berneta Sages is also the church organist. Son, Darnelle Maffucci is a truck Geophysicist/field seismologist in [employer]. The patient has one grandchild. She is a Tourist information centre manager.     ADVANCED DIRECTIVES: In place. The patient's husband is her healthcare power of attorney   HEALTH MAINTENANCE: Social History    Tobacco Use   Smoking status: Never   Smokeless tobacco: Never  Substance Use Topics   Alcohol use: No   Drug use: No     Colonoscopy:2017 / [place]   PAP:  Bone density: 2019/ [place] normal   Allergies  Allergen Reactions   Gabapentin Anxiety   Pyridium [Phenazopyridine Hcl]     Nausea  Vomiting diarrhea   Atorvastatin Other (See Comments)    myalgias    Duloxetine Other (See Comments)    UNSPECIFIED REACTION    Nsaids Rash   Oseltamivir Diarrhea and Other (See Comments)    Abdominal pain     Current Outpatient Medications  Medication Sig Dispense Refill   acetaminophen (TYLENOL) 500 MG tablet Take 500 mg by mouth every 6 (six) hours as needed for moderate pain or headache.      anastrozole (ARIMIDEX) 1 MG tablet Take 1 tablet (1 mg total) by mouth daily. 90 tablet 2   aspirin EC 81 MG tablet Take 81 mg by mouth daily after breakfast.      atenolol (TENORMIN) 50 MG tablet Take 50 mg by mouth daily before breakfast.      buPROPion (WELLBUTRIN XL) 150 MG 24 hr tablet Take 150 mg by mouth daily before breakfast.     dicyclomine (BENTYL) 20 MG tablet Take 1 tablet (20 mg total) by mouth 2 (two) times daily. 20 tablet 0   Docusate Calcium (STOOL SOFTENER PO) Take by mouth as needed.     ferrous sulfate 325 (65 FE) MG tablet Take 1 tablet (325 mg total) by mouth 2 (two) times daily with a meal. (Patient taking differently: Take 325 mg by mouth daily after breakfast.) 60 tablet 0   fexofenadine (ALLEGRA) 180 MG tablet Take 1 tablet (180 mg total) by mouth daily. 30 tablet 0   fluticasone (FLONASE) 50 MCG/ACT nasal spray Place 2 sprays into both nostrils daily. 16 g 0   folic acid (FOLVITE) 1 MG tablet Take 1 mg by mouth daily after breakfast.  Ibuprofen 200 MG CAPS Take by mouth daily as needed.     metFORMIN (GLUCOPHAGE) 500 MG tablet Take 1 tablet (500 mg total) by mouth daily. Take 1/2 of pill by mouth every morning with food. (Patient not taking: Reported on 08/21/2021)  30 tablet 0   Meth-Hyo-M Bl-Na Phos-Ph Sal (URO-MP PO) Take by mouth as needed.     omeprazole (PRILOSEC) 40 MG capsule Take 40 mg by mouth every morning.     polyethylene glycol (MIRALAX) 17 g packet Take 17 g by mouth daily. 14 each 0   traMADol (ULTRAM) 50 MG tablet Take 50 mg by mouth every 6 (six) hours as needed for moderate pain or severe pain.      traZODone (DESYREL) 50 MG tablet Take 50 mg by mouth at bedtime.      Vitamin D, Ergocalciferol, (DRISDOL) 1.25 MG (50000 UNIT) CAPS capsule Take 1 capsule (50,000 Units total) by mouth every 7 (seven) days. 12 capsule 0   No current facility-administered medications for this visit.    OBJECTIVE: White woman who appears stated age  There were no vitals filed for this visit.     There is no height or weight on file to calculate BMI.   Wt Readings from Last 3 Encounters:  04/30/22 230 lb (104.3 kg)  11/07/21 236 lb 1.6 oz (107.1 kg)  08/21/21 229 lb (103.9 kg)   Sclerae unicteric, EOMs intact Neck: No palpable adenopathy. Breast: Right breast status postmastectomy.  Left breast inspection and palpation normal without any masses or regional adenopathy Lower extremities: No lower extremity edema  LAB RESULTS:  CMP     Component Value Date/Time   NA 127 (L) 04/30/2022 1129   NA 131 (L) 09/06/2017 1018   K 4.3 04/30/2022 1129   K 4.2 09/06/2017 1018   CL 96 (L) 04/30/2022 1129   CO2 23 04/30/2022 1129   CO2 25 09/06/2017 1018   GLUCOSE 141 (H) 04/30/2022 1129   GLUCOSE 187 (H) 09/06/2017 1018   BUN 20 04/30/2022 1129   BUN 11.2 09/06/2017 1018   CREATININE 1.12 (H) 04/30/2022 1129   CREATININE 1.10 (H) 11/07/2021 1302   CREATININE 1.2 (H) 09/06/2017 1018   CALCIUM 8.9 04/30/2022 1129   CALCIUM 9.3 09/06/2017 1018   PROT 7.3 04/30/2022 1129   PROT 6.9 09/06/2017 1018   ALBUMIN 3.7 04/30/2022 1129   ALBUMIN 3.4 (L) 09/06/2017 1018   AST 15 04/30/2022 1129   AST 13 (L) 11/07/2021 1302   AST 20 09/06/2017 1018   ALT 9  04/30/2022 1129   ALT 10 11/07/2021 1302   ALT 14 09/06/2017 1018   ALKPHOS 97 04/30/2022 1129   ALKPHOS 103 09/06/2017 1018   BILITOT 0.4 04/30/2022 1129   BILITOT 0.4 11/07/2021 1302   BILITOT 0.57 09/06/2017 1018   GFRNONAA 50 (L) 04/30/2022 1129   GFRNONAA 51 (L) 11/07/2021 1302   GFRAA >60 02/24/2020 0536   GFRAA 59 (L) 02/21/2018 1139    No results found for: "TOTALPROTELP", "ALBUMINELP", "A1GS", "A2GS", "BETS", "BETA2SER", "GAMS", "MSPIKE", "SPEI"  No results found for: "KPAFRELGTCHN", "LAMBDASER", "KAPLAMBRATIO"  Lab Results  Component Value Date   WBC 10.4 04/30/2022   NEUTROABS 7.4 04/30/2022   HGB 10.7 (L) 04/30/2022   HCT 33.4 (L) 04/30/2022   MCV 78.6 (L) 04/30/2022   PLT 342 04/30/2022      Chemistry      Component Value Date/Time   NA 127 (L) 04/30/2022 1129   NA 131 (L) 09/06/2017 1018  K 4.3 04/30/2022 1129   K 4.2 09/06/2017 1018   CL 96 (L) 04/30/2022 1129   CO2 23 04/30/2022 1129   CO2 25 09/06/2017 1018   BUN 20 04/30/2022 1129   BUN 11.2 09/06/2017 1018   CREATININE 1.12 (H) 04/30/2022 1129   CREATININE 1.10 (H) 11/07/2021 1302   CREATININE 1.2 (H) 09/06/2017 1018      Component Value Date/Time   CALCIUM 8.9 04/30/2022 1129   CALCIUM 9.3 09/06/2017 1018   ALKPHOS 97 04/30/2022 1129   ALKPHOS 103 09/06/2017 1018   AST 15 04/30/2022 1129   AST 13 (L) 11/07/2021 1302   AST 20 09/06/2017 1018   ALT 9 04/30/2022 1129   ALT 10 11/07/2021 1302   ALT 14 09/06/2017 1018   BILITOT 0.4 04/30/2022 1129   BILITOT 0.4 11/07/2021 1302   BILITOT 0.57 09/06/2017 1018       No results found for: "LABCA2"  No components found for: "[id number]"  No results for input(s): "INR" in the last 168 hours.  Urinalysis    Component Value Date/Time   COLORURINE YELLOW 04/30/2022 1027   APPEARANCEUR CLEAR 04/30/2022 1027   LABSPEC 1.015 04/30/2022 1027   PHURINE 7.0 04/30/2022 1027   GLUCOSEU NEGATIVE 04/30/2022 1027   HGBUR TRACE (A) 04/30/2022 1027    BILIRUBINUR NEGATIVE 04/30/2022 1027   KETONESUR NEGATIVE 04/30/2022 1027   PROTEINUR NEGATIVE 04/30/2022 1027   UROBILINOGEN 0.2 04/06/2015 1332   NITRITE NEGATIVE 04/30/2022 1027   LEUKOCYTESUR TRACE (A) 04/30/2022 1027    STUDIES: No results found.   ELIGIBLE FOR AVAILABLE RESEARCH PROTOCOL: No  ASSESSMENT: 81 y.o. [place] woman status post right breast upper inner quadrant biopsy 2 on 05/27/2017 showing a clinical T1c N0, stage IA invasive ductal carcinoma, grade 3, estrogen and progesterone receptor positive, HER-2 amplified, with an MIB-1 of 60%  (1) genetics testing 07/14/2017 through the Multi-Cancer panel + Breast Cancer Panel found no deleterious mutations in  ALK, APC,AKT1, ATM, AXIN2,BAP1,  BARD1, BLM, BMPR1A, BRCA1, BRCA2, BRIP1, CASR, CDC73, CDH1, CDK4, CDKN1B, CDKN1C, CDKN2A (p14ARF), CDKN2A (p16INK4a), CEBPA, CHEK2, CTNNA1, DICER1, DIS3L2, EGFR (c.2369C>T, p.Thr790Met variant only), EPCAM (Deletion/duplication testing only), FH, FAM175A, FLCN, GATA2, GPC3, GREM1 (Promoter region deletion/duplication testing only), HOXB13 (c.251G>A, p.Gly84Glu), HRAS, KIT, MAX, MEN1, MET, MITF (c.952G>A, p.Glu318Lys variant only), MLH1, MSH2, MSH3, MSH6, MUTYH, NBN, NF1, NF2, NTHL1, PALB2, PDGFRA, PHOX2B, PMS2, POLD1, POLE, POT1, PRKAR1A, PTCH1, PTEN, RAD50, RAD51C, RAD51D, RB1, RECQL4, RET, RUNX1, SDHAF2, SDHA (sequence changes only), SDHB, SDHC, SDHD, SMAD4, SMARCA4, SMARCB1, SMARCE1, STK11, SUFU, TERC, TERT, TMEM127, TP53, TSC1, TSC2, VHL, WRN, WT1, FANCC, MRE11, PIK3CA, RINT1, XRCC2.  (a)  3 Variants of Uncertain significance (VUS) were identified.  BRCA1 c.2551G>A (p.Glu851Lys)----CHEK2 c.679G>A (p.Gly227Arg).  The CHECK 2 variant has been reported as possibly mosaic--- SDHA c.704T>C (p.Ile235Thr).  (2) anastrozole started 06/05/2017, to be continued and minimum of 5 years   (a) bone density 06/03/2018 showed a T score of -0.5 (normal).  (3) status post right mastectomy and sentinel  lymph node sampling 07/02/2017 for an mpT2 pN0, stage IB invasive ductal carcinoma, grade 3, with negative margins.  (a) the patient opted against reconstruction  (4) planned trastuzumab for 6 months started 08/09/2017, discontinued after one dose with significant reaction  (a) baseline echocardiogram 06/19/2017 shows an excellent ejection fraction  (b) Echocardiogram 09/16/2017 showed an ejection fraction of 60-65%  (5) started lapatinib 12/02/2017-- continued 6 months (completed July 2019)  (a) echocardiogram 01/21/2018 shows an ejection fraction in the 60-65%.  (b) echocardiogram  06/02/2018 showed an ejection fraction in the 55-60% range   PLAN:  Kiara Brown is now 5-1/2 years out from definitive surgery for her breast cancer with no evidence of disease recurrence.  This is very favorable. She has finished 5 years of antiestrogen therapy with anastrozole Today we have discussed about proceeding with surveillance including annual mammograms.  Last mammogram in September without any concerns. Physical examination today also without any concerns for recurrence. She is comfortable following up with her PCP with annual breast exam and annual mammogram and will return to breast cancer center as needed. We also discussed about repeating bone density since her last bone density was back in 2019.  She expressed understanding. All her questions were answered to the best of my knowledge. Total time spent: 30 minutes, she is a new patient to me, transitioning from Dr. Jana Hakim upon his retirement.  *Total Encounter Time as defined by the Centers for Medicare and Medicaid Services includes, in addition to the face-to-face time of a patient visit (documented in the note above) non-face-to-face time: obtaining and reviewing outside history, ordering and reviewing medications, tests or procedures, care coordination (communications with other health care professionals or caregivers) and documentation in the  medical record.

## 2022-07-11 ENCOUNTER — Telehealth: Payer: Self-pay | Admitting: *Deleted

## 2022-07-11 NOTE — Telephone Encounter (Addendum)
-----   Message from Benay Pike, MD sent at 07/09/2022  2:50 PM EDT ----- Patient wanted know the result of her labs.  She also wanted a copy.  Can you please mail a copy of it, she said she couldn't access her My chart. Overall everything looks stable  Labs mailed per above request.

## 2022-07-17 DIAGNOSIS — M533 Sacrococcygeal disorders, not elsewhere classified: Secondary | ICD-10-CM | POA: Diagnosis not present

## 2022-07-19 DIAGNOSIS — M0579 Rheumatoid arthritis with rheumatoid factor of multiple sites without organ or systems involvement: Secondary | ICD-10-CM | POA: Diagnosis not present

## 2022-07-19 DIAGNOSIS — E782 Mixed hyperlipidemia: Secondary | ICD-10-CM | POA: Diagnosis not present

## 2022-07-19 DIAGNOSIS — I129 Hypertensive chronic kidney disease with stage 1 through stage 4 chronic kidney disease, or unspecified chronic kidney disease: Secondary | ICD-10-CM | POA: Diagnosis not present

## 2022-07-19 DIAGNOSIS — Z0000 Encounter for general adult medical examination without abnormal findings: Secondary | ICD-10-CM | POA: Diagnosis not present

## 2022-07-19 DIAGNOSIS — E559 Vitamin D deficiency, unspecified: Secondary | ICD-10-CM | POA: Diagnosis not present

## 2022-07-19 DIAGNOSIS — I10 Essential (primary) hypertension: Secondary | ICD-10-CM | POA: Diagnosis not present

## 2022-07-19 DIAGNOSIS — E1122 Type 2 diabetes mellitus with diabetic chronic kidney disease: Secondary | ICD-10-CM | POA: Diagnosis not present

## 2022-07-19 DIAGNOSIS — D649 Anemia, unspecified: Secondary | ICD-10-CM | POA: Diagnosis not present

## 2022-07-19 DIAGNOSIS — R5383 Other fatigue: Secondary | ICD-10-CM | POA: Diagnosis not present

## 2022-07-19 DIAGNOSIS — E1142 Type 2 diabetes mellitus with diabetic polyneuropathy: Secondary | ICD-10-CM | POA: Diagnosis not present

## 2022-07-19 DIAGNOSIS — N1831 Chronic kidney disease, stage 3a: Secondary | ICD-10-CM | POA: Diagnosis not present

## 2022-08-01 DIAGNOSIS — L720 Epidermal cyst: Secondary | ICD-10-CM | POA: Diagnosis not present

## 2022-08-01 DIAGNOSIS — L821 Other seborrheic keratosis: Secondary | ICD-10-CM | POA: Diagnosis not present

## 2022-08-01 DIAGNOSIS — L820 Inflamed seborrheic keratosis: Secondary | ICD-10-CM | POA: Diagnosis not present

## 2022-08-01 DIAGNOSIS — D225 Melanocytic nevi of trunk: Secondary | ICD-10-CM | POA: Diagnosis not present

## 2022-08-01 DIAGNOSIS — Z129 Encounter for screening for malignant neoplasm, site unspecified: Secondary | ICD-10-CM | POA: Diagnosis not present

## 2022-08-22 DIAGNOSIS — Z1231 Encounter for screening mammogram for malignant neoplasm of breast: Secondary | ICD-10-CM | POA: Diagnosis not present

## 2022-08-24 DIAGNOSIS — M25572 Pain in left ankle and joints of left foot: Secondary | ICD-10-CM | POA: Diagnosis not present

## 2022-08-24 DIAGNOSIS — M0579 Rheumatoid arthritis with rheumatoid factor of multiple sites without organ or systems involvement: Secondary | ICD-10-CM | POA: Diagnosis not present

## 2022-08-24 DIAGNOSIS — E1122 Type 2 diabetes mellitus with diabetic chronic kidney disease: Secondary | ICD-10-CM | POA: Diagnosis not present

## 2022-08-24 DIAGNOSIS — N1831 Chronic kidney disease, stage 3a: Secondary | ICD-10-CM | POA: Diagnosis not present

## 2022-08-24 DIAGNOSIS — M76822 Posterior tibial tendinitis, left leg: Secondary | ICD-10-CM | POA: Diagnosis not present

## 2022-08-30 DIAGNOSIS — M0609 Rheumatoid arthritis without rheumatoid factor, multiple sites: Secondary | ICD-10-CM | POA: Diagnosis not present

## 2022-08-30 DIAGNOSIS — G894 Chronic pain syndrome: Secondary | ICD-10-CM | POA: Diagnosis not present

## 2022-08-30 DIAGNOSIS — M1991 Primary osteoarthritis, unspecified site: Secondary | ICD-10-CM | POA: Diagnosis not present

## 2022-08-30 DIAGNOSIS — M797 Fibromyalgia: Secondary | ICD-10-CM | POA: Diagnosis not present

## 2022-09-10 DIAGNOSIS — M533 Sacrococcygeal disorders, not elsewhere classified: Secondary | ICD-10-CM | POA: Diagnosis not present

## 2022-09-10 DIAGNOSIS — M48062 Spinal stenosis, lumbar region with neurogenic claudication: Secondary | ICD-10-CM | POA: Diagnosis not present

## 2022-09-10 DIAGNOSIS — M5416 Radiculopathy, lumbar region: Secondary | ICD-10-CM | POA: Diagnosis not present

## 2022-09-21 DIAGNOSIS — E1122 Type 2 diabetes mellitus with diabetic chronic kidney disease: Secondary | ICD-10-CM | POA: Diagnosis not present

## 2022-09-21 DIAGNOSIS — N1831 Chronic kidney disease, stage 3a: Secondary | ICD-10-CM | POA: Diagnosis not present

## 2022-09-21 DIAGNOSIS — M79671 Pain in right foot: Secondary | ICD-10-CM | POA: Diagnosis not present

## 2022-10-11 DIAGNOSIS — M5416 Radiculopathy, lumbar region: Secondary | ICD-10-CM | POA: Diagnosis not present

## 2022-10-24 DIAGNOSIS — Z6839 Body mass index (BMI) 39.0-39.9, adult: Secondary | ICD-10-CM | POA: Diagnosis not present

## 2022-10-24 DIAGNOSIS — Z01419 Encounter for gynecological examination (general) (routine) without abnormal findings: Secondary | ICD-10-CM | POA: Diagnosis not present

## 2023-07-26 ENCOUNTER — Telehealth: Payer: Self-pay | Admitting: Hematology and Oncology

## 2023-08-16 ENCOUNTER — Other Ambulatory Visit: Payer: Self-pay | Admitting: *Deleted

## 2023-08-16 DIAGNOSIS — C50911 Malignant neoplasm of unspecified site of right female breast: Secondary | ICD-10-CM

## 2023-08-19 ENCOUNTER — Inpatient Hospital Stay (HOSPITAL_BASED_OUTPATIENT_CLINIC_OR_DEPARTMENT_OTHER): Payer: Medicare Other | Admitting: Hematology and Oncology

## 2023-08-19 ENCOUNTER — Inpatient Hospital Stay: Payer: Medicare Other | Attending: Hematology and Oncology

## 2023-08-19 ENCOUNTER — Encounter: Payer: Self-pay | Admitting: Hematology and Oncology

## 2023-08-19 VITALS — BP 156/70 | HR 61 | Temp 97.8°F | Resp 18 | Ht 63.0 in | Wt 230.8 lb

## 2023-08-19 DIAGNOSIS — C50911 Malignant neoplasm of unspecified site of right female breast: Secondary | ICD-10-CM

## 2023-08-19 DIAGNOSIS — D509 Iron deficiency anemia, unspecified: Secondary | ICD-10-CM | POA: Diagnosis not present

## 2023-08-19 DIAGNOSIS — Z170 Estrogen receptor positive status [ER+]: Secondary | ICD-10-CM | POA: Diagnosis not present

## 2023-08-19 DIAGNOSIS — Z7982 Long term (current) use of aspirin: Secondary | ICD-10-CM | POA: Diagnosis not present

## 2023-08-19 DIAGNOSIS — Z79811 Long term (current) use of aromatase inhibitors: Secondary | ICD-10-CM | POA: Insufficient documentation

## 2023-08-19 DIAGNOSIS — Z79899 Other long term (current) drug therapy: Secondary | ICD-10-CM | POA: Insufficient documentation

## 2023-08-19 DIAGNOSIS — C50211 Malignant neoplasm of upper-inner quadrant of right female breast: Secondary | ICD-10-CM | POA: Insufficient documentation

## 2023-08-19 DIAGNOSIS — Z9011 Acquired absence of right breast and nipple: Secondary | ICD-10-CM | POA: Diagnosis not present

## 2023-08-19 DIAGNOSIS — E538 Deficiency of other specified B group vitamins: Secondary | ICD-10-CM | POA: Diagnosis not present

## 2023-08-19 LAB — CMP (CANCER CENTER ONLY)
ALT: 8 U/L (ref 0–44)
AST: 11 U/L — ABNORMAL LOW (ref 15–41)
Albumin: 3.7 g/dL (ref 3.5–5.0)
Alkaline Phosphatase: 92 U/L (ref 38–126)
Anion gap: 7 (ref 5–15)
BUN: 15 mg/dL (ref 8–23)
CO2: 26 mmol/L (ref 22–32)
Calcium: 9.1 mg/dL (ref 8.9–10.3)
Chloride: 102 mmol/L (ref 98–111)
Creatinine: 1.1 mg/dL — ABNORMAL HIGH (ref 0.44–1.00)
GFR, Estimated: 50 mL/min — ABNORMAL LOW
Glucose, Bld: 188 mg/dL — ABNORMAL HIGH (ref 70–99)
Potassium: 4.1 mmol/L (ref 3.5–5.1)
Sodium: 135 mmol/L (ref 135–145)
Total Bilirubin: 0.4 mg/dL (ref 0.3–1.2)
Total Protein: 6.9 g/dL (ref 6.5–8.1)

## 2023-08-19 LAB — CBC WITH DIFFERENTIAL (CANCER CENTER ONLY)
Abs Immature Granulocytes: 0.03 K/uL (ref 0.00–0.07)
Basophils Absolute: 0 K/uL (ref 0.0–0.1)
Basophils Relative: 0 %
Eosinophils Absolute: 0.1 K/uL (ref 0.0–0.5)
Eosinophils Relative: 2 %
HCT: 31.2 % — ABNORMAL LOW (ref 36.0–46.0)
Hemoglobin: 9.7 g/dL — ABNORMAL LOW (ref 12.0–15.0)
Immature Granulocytes: 0 %
Lymphocytes Relative: 31 %
Lymphs Abs: 2.1 K/uL (ref 0.7–4.0)
MCH: 24.7 pg — ABNORMAL LOW (ref 26.0–34.0)
MCHC: 31.1 g/dL (ref 30.0–36.0)
MCV: 79.4 fL — ABNORMAL LOW (ref 80.0–100.0)
Monocytes Absolute: 0.6 K/uL (ref 0.1–1.0)
Monocytes Relative: 9 %
Neutro Abs: 4 K/uL (ref 1.7–7.7)
Neutrophils Relative %: 58 %
Platelet Count: 318 K/uL (ref 150–400)
RBC: 3.93 MIL/uL (ref 3.87–5.11)
RDW: 15.9 % — ABNORMAL HIGH (ref 11.5–15.5)
WBC Count: 6.8 K/uL (ref 4.0–10.5)
nRBC: 0 % (ref 0.0–0.2)

## 2023-08-19 MED ORDER — ERGOCALCIFEROL 1.25 MG (50000 UT) PO CAPS
50000.0000 [IU] | ORAL_CAPSULE | ORAL | 0 refills | Status: DC
Start: 2023-08-19 — End: 2023-11-13

## 2023-08-19 NOTE — Progress Notes (Signed)
[facility]  [phone] [phone]     ID: Kiara Brown DOB: [birth year]  MR#: [id number]  MWN#:[id number]  Patient Care Team: Zoila Shutter, MD as PCP - General (Internal Medicine) Glenna Fellows, MD (Inactive) as Consulting Physician (General Surgery) Magrinat, Valentino Hue, MD (Inactive) as Consulting Physician (Oncology) Antony Blackbird, MD as Consulting Physician (Radiation Oncology) Coralyn Pear, MD as Referring Physician (Internal Medicine) Bensimhon, Bevelyn Buckles, MD as Consulting Physician (Cardiology) Annice Pih., MD as Consulting Physician (Urology) Allena Katz, Janus Delene Ruffini, MD as Consulting Physician (Pain Medicine)  CHIEF COMPLAINT: 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  History of Present Illness   The patient, with a history of anemia, presents with chronic fatigue and generalized weakness. She reports a long-standing history of anemia, which has been progressively worsening. Despite extensive workup five years ago, no cause was identified. She has been on iron supplements for years, but discontinued due to lack of improvement and side effects. She also reports a loss of appetite and a distaste for meats, but denies any weight loss. She has a history of back problems, which cause constant pain and contribute to her difficulty walking. She also reports a history of breast cancer, for which she completed a five-year course of anastrozole. She is due for a mammogram next month and denies any changes in her breasts.        COVID 19 VACCINATION STATUS: Status post Pfizer x2+ booster September 2021 MI: Also had COVID August 2022   BREAST CANCER HISTORY: From the original intake note:  "Kiara Brown" herself noted a change around  her nipple on the right and brought it to medical attention. She was set up for bilateral diagnostic mammography with tomography and right breast ultrasonography at [facility] 05/23/2017. The breast density was category C. In the right breast upper inner quadrant there was a 2.5 cm area of grouped pleomorphic calcifications correlating with the palpable mass. Also there was a 1.2 cm macrolobulated mass in the upper inner quadrant also in the palpable region. These masses were identifiable by ultrasound with the same dimensions. The right axilla was sonographically negative.  On 05/27/2017 the patient underwent biopsy of the 2 masses in question. 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  The patient's subsequent history is as detailed below.   PAST MEDICAL HISTORY: Past Medical History:  Diagnosis Date   Anemia    Arthritis    osteo, rheumatoid   Back pain    Breast cancer (HCC)    Cancer (HCC)    Depression 02/22/2020   Diabetes (HCC)    Diverticulosis    Dysrhythmia    hx rapid heartbeat, no cardiologist   Family history of breast cancer    Family history of prostate cancer    Fibromyalgia    peripheral neuropathy   Fibromyalgia    Fibromyalgia    Gallbladder problem    GERD (gastroesophageal reflux disease)  H/O hiatal hernia    Hyperlipidemia    Joint pain    Lower extremity edema    Obesity    Osteoarthritis    PONV (postoperative nausea and vomiting)    "THROAT WITH EXTREME WGNFAOZ"3086, ulnar nerve surgery   Rheumatoid arthritis (HCC)    RLS (restless legs syndrome)     Sciatica    Shortness of breath dyspnea    with exertion   Slipped disc in neck    UTI (lower urinary tract infection)     PAST SURGICAL HISTORY: Past Surgical History:  Procedure Laterality Date   ABDOMINAL HYSTERECTOMY     APPENDECTOMY     back injection      July 2018- x1   BREAST SURGERY Left 1986   biopsy, Recent 05/2017 at [facility]- had biopsy on R breast- malignant    CARDIAC CATHETERIZATION  2009   "no blockage per pt"   CHOLECYSTECTOMY     CYSTOSCOPY WITH BIOPSY     INCONTINENCE SURGERY     IRRIGATION AND DEBRIDEMENT ABSCESS Right 01/28/2018   Procedure: IRRIGATION AND DEBRIDEMENT AXILLARY ABSCESS;  Surgeon: Glenna Fellows, MD;  Location: [facility];  Service: General;  Laterality: Right;   JOINT REPLACEMENT     KNEE ARTHROSCOPY     bilateral   KNEE ARTHROSCOPY Left 03/17/2014   Procedure: ARTHROSCOPY LEFT KNEE WITH SYNOVECTOMY;  Surgeon: Loanne Drilling, MD;  Location: [facility];  Service: Orthopedics;  Laterality: Left;   PORTACATH PLACEMENT Left 07/02/2017   Procedure: INSERTION PORT-A-CATH;  Surgeon: Glenna Fellows, MD;  Location: [facility];  Service: General;  Laterality: Left;   RIGHT/LEFT HEART CATH AND CORONARY ANGIOGRAPHY N/A 08/30/2017   Procedure: RIGHT/LEFT HEART CATH AND CORONARY ANGIOGRAPHY;  Surgeon: Laurey Morale, MD;  Location: [facility] INVASIVE CV LAB;  Service: Cardiovascular;  Laterality: N/A;   ROTATOR CUFF REPAIR     right   SIMPLE MASTECTOMY WITH AXILLARY SENTINEL NODE BIOPSY Right 07/02/2017   Procedure: RIGHT TOTAL  MASTECTOMY WITH AXILLARY SENTINEL LYMPH  NODE BIOPSY;  Surgeon: Glenna Fellows, MD;  Location: [facility];  Service: General;  Laterality: Right;   TOTAL KNEE ARTHROPLASTY  08/04/2012   Procedure: TOTAL KNEE ARTHROPLASTY;  Surgeon: Loanne Drilling, MD;  Location: [facility];  Service: Orthopedics;  Laterality: Left;   TOTAL KNEE ARTHROPLASTY Right 04/11/2015   Procedure: RIGHT TOTAL KNEE ARTHROPLASTY;  Surgeon: Ollen Gross, MD;  Location: [facility];  Service:  Orthopedics;  Laterality: Right;   ulnar nerve surgery Right 1986    FAMILY HISTORY Family History  Problem Relation Age of Onset   Hypertension Mother    Stroke Mother    Breast cancer Mother 66       died at 54   Diabetes Mother    Hyperlipidemia Mother    Heart disease Mother    Cancer Mother    Obesity Mother    Hypertension Father    Heart attack Father        died at 53   Sudden death Father    Hypertension Brother        MI   Prostate cancer Brother 61   Hypertension Brother        MI   Lung cancer Brother        possible lung cancer, spot found on lung being monitored   Hypertension Brother        MI   Non-Hodgkin's lymphoma Sister 64   Breast cancer Other 27  is currently 44   Breast cancer Other 55   Cancer Cousin 62       type of cancer unk.    Cancer Other        type unknown, eye/face    Cancer Other 65       died from cancer in his 33's, type unknown  The patient's father died from a heart attack at age 42. The patient's mother was diagnosed with breast cancer at age 34 and died at age 82. The patient had 5 brothers, 3 sisters. One sister had breast cancer at age 9. One brother was diagnosed with prostate cancer at age 4. One Brother died from lung cancer at age 2. One sister had non-Hodgkin's lymphoma diagnosed at age 59.    GYNECOLOGIC HISTORY:  No LMP recorded. Patient has had a hysterectomy.  menarche age 63, first live birth age 61. All 3 of her births were premature including 1 set of twins. One of the twins died a few hours after birth. She underwent hysterectomy in 1981 with unilateral salpingo-oophorectomy. 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  She has led groups as far away as [place] and [place] and has led many in the [place] cruises as well.  Her husband, Earvin Hansen began a business installing fences which is  now carried on by his son Joselyn Glassman. Earvin Hansen is also the church organist. Son, Feliz Beam is a truck Hospital doctor in [employer]. The patient has one grandchild. She is a International aid/development worker.     ADVANCED DIRECTIVES: In place. The patient's husband is her healthcare power of attorney   HEALTH MAINTENANCE: Social History   Tobacco Use   Smoking status: Never   Smokeless tobacco: Never  Substance Use Topics   Alcohol use: No   Drug use: No     Colonoscopy:2017 / [place]   PAP:  Bone density: 2019/ [facility] normal   Allergies  Allergen Reactions   Gabapentin Anxiety   Pyridium [Phenazopyridine Hcl]     Nausea  Vomiting diarrhea   Atorvastatin Other (See Comments)    myalgias    Duloxetine Other (See Comments)    UNSPECIFIED REACTION    Nsaids Rash   Oseltamivir Diarrhea and Other (See Comments)    Abdominal pain     Current Outpatient Medications  Medication Sig Dispense Refill   ergocalciferol (VITAMIN D2) 1.25 MG (50000 UT) capsule Take 1 capsule (50,000 Units total) by mouth once a week. 8 capsule 0   acetaminophen (TYLENOL) 500 MG tablet Take 500 mg by mouth every 6 (six) hours as needed for moderate pain or headache.      anastrozole (ARIMIDEX) 1 MG tablet Take 1 tablet (1 mg total) by mouth daily. 90 tablet 2   aspirin EC 81 MG tablet Take 81 mg by mouth daily after breakfast.      atenolol (TENORMIN) 50 MG tablet Take 50 mg by mouth daily before breakfast.      buPROPion (WELLBUTRIN XL) 150 MG 24 hr tablet Take 150 mg by mouth daily before breakfast.     Docusate Calcium (STOOL SOFTENER PO) Take by mouth as needed.     ferrous sulfate 325 (65 FE) MG tablet Take 1 tablet (325 mg total) by mouth 2 (two) times daily with a meal. (Patient taking differently: Take 325 mg by mouth daily after breakfast.) 60 tablet 0   fexofenadine (ALLEGRA)  180 MG tablet Take 1 tablet (180 mg total) by mouth daily. 30 tablet 0   fluticasone (FLONASE) 50 MCG/ACT nasal spray Place 2 sprays into both nostrils daily. 16  g 0   folic acid (FOLVITE) 1 MG tablet Take 1 mg by mouth daily after breakfast.      Ibuprofen 200 MG CAPS Take by mouth daily as needed.     Meth-Hyo-M Bl-Na Phos-Ph Sal (URO-MP PO) Take by mouth as needed.     omeprazole (PRILOSEC) 40 MG capsule Take 40 mg by mouth every morning.     polyethylene glycol (MIRALAX) 17 g packet Take 17 g by mouth daily. 14 each 0   traMADol (ULTRAM) 50 MG tablet Take 50 mg by mouth every 6 (six) hours as needed for moderate pain or severe pain.      traZODone (DESYREL) 50 MG tablet Take 50 mg by mouth at bedtime.      No current facility-administered medications for this visit.    OBJECTIVE: White woman who appears stated age  Vitals:   08/19/23 0818  BP: (!) 156/70  Pulse: 61  Resp: 18  Temp: 97.8 F (36.6 C)  SpO2: 98%       Body mass index is 40.88 kg/m.   Wt Readings from Last 3 Encounters:  08/19/23 230 lb 12.8 oz (104.7 kg)  07/09/22 235 lb 1 oz (106.6 kg)  04/30/22 230 lb (104.3 kg)   Sclerae unicteric, EOMs intact Neck: No palpable adenopathy. Breast: Right breast status postmastectomy.  Left breast inspection and palpation normal without any masses or regional adenopathy Chest: CTA Heart: RRR Lower extremities: No lower extremity edema  LAB RESULTS:  CMP     Component Value Date/Time   NA 135 08/19/2023 0802   NA 131 (L) 09/06/2017 1018   K 4.1 08/19/2023 0802   K 4.2 09/06/2017 1018   CL 102 08/19/2023 0802   CO2 26 08/19/2023 0802   CO2 25 09/06/2017 1018   GLUCOSE 188 (H) 08/19/2023 0802   GLUCOSE 187 (H) 09/06/2017 1018   BUN 15 08/19/2023 0802   BUN 11.2 09/06/2017 1018   CREATININE 1.10 (H) 08/19/2023 0802   CREATININE 1.2 (H) 09/06/2017 1018   CALCIUM 9.1 08/19/2023 0802   CALCIUM 9.3 09/06/2017 1018   PROT 6.9 08/19/2023 0802   PROT 6.9 09/06/2017 1018   ALBUMIN 3.7 08/19/2023 0802   ALBUMIN 3.4 (L) 09/06/2017 1018   AST 11 (L) 08/19/2023 0802   AST 20 09/06/2017 1018   ALT 8 08/19/2023 0802   ALT 14  09/06/2017 1018   ALKPHOS 92 08/19/2023 0802   ALKPHOS 103 09/06/2017 1018   BILITOT 0.4 08/19/2023 0802   BILITOT 0.57 09/06/2017 1018   GFRNONAA 50 (L) 08/19/2023 0802   GFRAA >60 02/24/2020 0536   GFRAA 59 (L) 02/21/2018 1139    No results found for: "TOTALPROTELP", "ALBUMINELP", "A1GS", "A2GS", "BETS", "BETA2SER", "GAMS", "MSPIKE", "SPEI"  No results found for: "KPAFRELGTCHN", "LAMBDASER", "KAPLAMBRATIO"  Lab Results  Component Value Date   WBC 6.8 08/19/2023   NEUTROABS 4.0 08/19/2023   HGB 9.7 (L) 08/19/2023   HCT 31.2 (L) 08/19/2023   MCV 79.4 (L) 08/19/2023   PLT 318 08/19/2023      Chemistry      Component Value Date/Time   NA 135 08/19/2023 0802   NA 131 (L) 09/06/2017 1018   K 4.1 08/19/2023 0802   K 4.2 09/06/2017 1018   CL 102 08/19/2023 0802   CO2 26 08/19/2023 0802  CO2 25 09/06/2017 1018   BUN 15 08/19/2023 0802   BUN 11.2 09/06/2017 1018   CREATININE 1.10 (H) 08/19/2023 0802   CREATININE 1.2 (H) 09/06/2017 1018      Component Value Date/Time   CALCIUM 9.1 08/19/2023 0802   CALCIUM 9.3 09/06/2017 1018   ALKPHOS 92 08/19/2023 0802   ALKPHOS 103 09/06/2017 1018   AST 11 (L) 08/19/2023 0802   AST 20 09/06/2017 1018   ALT 8 08/19/2023 0802   ALT 14 09/06/2017 1018   BILITOT 0.4 08/19/2023 0802   BILITOT 0.57 09/06/2017 1018       No results found for: "LABCA2"  No components found for: "NFAOZH086"  No results for input(s): "INR" in the last 168 hours.  Urinalysis    Component Value Date/Time   COLORURINE YELLOW 04/30/2022 1027   APPEARANCEUR CLEAR 04/30/2022 1027   LABSPEC 1.015 04/30/2022 1027   PHURINE 7.0 04/30/2022 1027   GLUCOSEU NEGATIVE 04/30/2022 1027   HGBUR TRACE (A) 04/30/2022 1027   BILIRUBINUR NEGATIVE 04/30/2022 1027   KETONESUR NEGATIVE 04/30/2022 1027   PROTEINUR NEGATIVE 04/30/2022 1027   UROBILINOGEN 0.2 04/06/2015 1332   NITRITE NEGATIVE 04/30/2022 1027   LEUKOCYTESUR TRACE (A) 04/30/2022 1027    STUDIES: No  results found.   ELIGIBLE FOR AVAILABLE RESEARCH PROTOCOL: No  ASSESSMENT: 82 y.o. [place] woman status post right breast upper inner quadrant biopsy 2 on 05/27/2017 showing a clinical T1c N0, stage IA invasive ductal carcinoma, grade 3, estrogen and progesterone receptor positive, HER-2 amplified, with an MIB-1 of 60%  (1) genetics testing 07/14/2017 through the Multi-Cancer panel + Breast Cancer Panel found no deleterious mutations in  ALK, APC,AKT1, ATM, AXIN2,BAP1,  BARD1, BLM, BMPR1A, BRCA1, BRCA2, BRIP1, CASR, CDC73, CDH1, CDK4, CDKN1B, CDKN1C, CDKN2A (p14ARF), CDKN2A (p16INK4a), CEBPA, CHEK2, CTNNA1, DICER1, DIS3L2, EGFR (c.2369C>T, p.Thr790Met variant only), EPCAM (Deletion/duplication testing only), FH, FAM175A, FLCN, GATA2, GPC3, GREM1 (Promoter region deletion/duplication testing only), HOXB13 (c.251G>A, p.Gly84Glu), HRAS, KIT, MAX, MEN1, MET, MITF (c.952G>A, p.Glu318Lys variant only), MLH1, MSH2, MSH3, MSH6, MUTYH, NBN, NF1, NF2, NTHL1, PALB2, PDGFRA, PHOX2B, PMS2, POLD1, POLE, POT1, PRKAR1A, PTCH1, PTEN, RAD50, RAD51C, RAD51D, RB1, RECQL4, RET, RUNX1, SDHAF2, SDHA (sequence changes only), SDHB, SDHC, SDHD, SMAD4, SMARCA4, SMARCB1, SMARCE1, STK11, SUFU, TERC, TERT, TMEM127, TP53, TSC1, TSC2, VHL, WRN, WT1, FANCC, MRE11, PIK3CA, RINT1, XRCC2.  (a)  3 Variants of Uncertain significance (VUS) were identified.  BRCA1 c.2551G>A (p.Glu851Lys)----CHEK2 c.679G>A (p.Gly227Arg).  The CHECK 2 variant has been reported as possibly mosaic--- SDHA c.704T>C (p.Ile235Thr).  (2) anastrozole started 06/05/2017, to be continued and minimum of 5 years   (a) bone density 06/03/2018 showed a T score of -0.5 (normal).  (3) status post right mastectomy and sentinel lymph node sampling 07/02/2017 for an mpT2 pN0, stage IB invasive ductal carcinoma, grade 3, with negative margins.  (a) the patient opted against reconstruction  (4) planned trastuzumab for 6 months started 08/09/2017, discontinued after one dose  with significant reaction  (a) baseline echocardiogram 06/19/2017 shows an excellent ejection fraction  (b) Echocardiogram 09/16/2017 showed an ejection fraction of 60-65%  (5) started lapatinib 12/02/2017-- continued 6 months (completed July 2019)  (a) echocardiogram 01/21/2018 shows an ejection fraction in the 60-65%.  (b) echocardiogram 06/02/2018 showed an ejection fraction in the 55-60% range   PLAN:  Breast cancer Kiara Brown is now 6 years out from definitive surgery for her breast cancer with no evidence of disease recurrence.  This is very favorable. Last mammogram in September without any concerns. Physical examination today also  without any concerns for recurrence. She is due for mammogram next month.  Iron Deficiency Anemia Chronic anemia with hemoglobin stable at 9.7. Iron supplementation via oral route ineffective and causing side effects. No identifiable cause of anemia found in previous workup. -Administer IV iron (Venofer) in three split doses.  Vitamin B12 Deficiency Identified deficiency with no current supplementation. -Start over-the-counter B12 supplement at 1000 micrograms daily.  Vitamin D Deficiency Identified deficiency with no current supplementation. -Prescribe Vitamin D 50,000 units weekly for 8 weeks. -After 8 weeks, transition to over-the-counter Vitamin D 1000 international units daily.  General Health Maintenance -Continue with scheduled mammogram next month. -Follow-up in 2-3 months to assess response to supplementation.  Total time spent: 30 minutes, she is a new patient to me, transitioning from Dr. Darnelle Catalan upon his retirement.  *Total Encounter Time as defined by the Centers for Medicare and Medicaid Services includes, in addition to the face-to-face time of a patient visit (documented in the note above) non-face-to-face time: obtaining and reviewing outside history, ordering and reviewing medications, tests or procedures, care coordination  (communications with other health care professionals or caregivers) and documentation in the medical record.

## 2023-09-04 ENCOUNTER — Inpatient Hospital Stay: Payer: Medicare Other | Attending: Hematology and Oncology

## 2023-09-04 VITALS — BP 156/58 | HR 61 | Temp 97.6°F | Resp 16

## 2023-09-04 DIAGNOSIS — E559 Vitamin D deficiency, unspecified: Secondary | ICD-10-CM | POA: Diagnosis not present

## 2023-09-04 DIAGNOSIS — E538 Deficiency of other specified B group vitamins: Secondary | ICD-10-CM | POA: Insufficient documentation

## 2023-09-04 DIAGNOSIS — D509 Iron deficiency anemia, unspecified: Secondary | ICD-10-CM | POA: Diagnosis present

## 2023-09-04 DIAGNOSIS — Z170 Estrogen receptor positive status [ER+]: Secondary | ICD-10-CM | POA: Insufficient documentation

## 2023-09-04 DIAGNOSIS — Z79811 Long term (current) use of aromatase inhibitors: Secondary | ICD-10-CM | POA: Diagnosis not present

## 2023-09-04 DIAGNOSIS — C50211 Malignant neoplasm of upper-inner quadrant of right female breast: Secondary | ICD-10-CM | POA: Diagnosis present

## 2023-09-04 MED ORDER — SODIUM CHLORIDE 0.9 % IV SOLN: Freq: Once | INTRAVENOUS | Status: AC

## 2023-09-04 MED ORDER — SODIUM CHLORIDE 0.9 % IV SOLN
300.0000 mg | Freq: Once | INTRAVENOUS | Status: AC
  Administered 2023-09-04: 300 mg via INTRAVENOUS
  Filled 2023-09-04: qty 300

## 2023-09-04 NOTE — Progress Notes (Signed)
Patient was observed for 30 minutes post IV iron infusion. Vitals stable and patient in no distress upon leaving infusion room.

## 2023-09-04 NOTE — Patient Instructions (Signed)
Iron Sucrose Injection What is this medication? IRON SUCROSE (EYE ern SOO krose) treats low levels of iron (iron deficiency anemia) in people with kidney disease. Iron is a mineral that plays an important role in making red blood cells, which carry oxygen from your lungs to the rest of your body. This medicine may be used for other purposes; ask your health care provider or pharmacist if you have questions. COMMON BRAND NAME(S): Venofer What should I tell my care team before I take this medication? They need to know if you have any of these conditions: Anemia not caused by low iron levels Heart disease High levels of iron in the blood Kidney disease Liver disease An unusual or allergic reaction to iron, other medications, foods, dyes, or preservatives Pregnant or trying to get pregnant Breastfeeding How should I use this medication? This medication is for infusion into a vein. It is given in a hospital or clinic setting. Talk to your care team about the use of this medication in children. While this medication may be prescribed for children as young as 2 years for selected conditions, precautions do apply. Overdosage: If you think you have taken too much of this medicine contact a poison control center or emergency room at once. NOTE: This medicine is only for you. Do not share this medicine with others. What if I miss a dose? Keep appointments for follow-up doses. It is important not to miss your dose. Call your care team if you are unable to keep an appointment. What may interact with this medication? Do not take this medication with any of the following: Deferoxamine Dimercaprol Other iron products This medication may also interact with the following: Chloramphenicol Deferasirox This list may not describe all possible interactions. Give your health care provider a list of all the medicines, herbs, non-prescription drugs, or dietary supplements you use. Also tell them if you smoke,  drink alcohol, or use illegal drugs. Some items may interact with your medicine. What should I watch for while using this medication? Visit your care team regularly. Tell your care team if your symptoms do not start to get better or if they get worse. You may need blood work done while you are taking this medication. You may need to follow a special diet. Talk to your care team. Foods that contain iron include: whole grains/cereals, dried fruits, beans, or peas, leafy green vegetables, and organ meats (liver, kidney). What side effects may I notice from receiving this medication? Side effects that you should report to your care team as soon as possible: Allergic reactions--skin rash, itching, hives, swelling of the face, lips, tongue, or throat Low blood pressure--dizziness, feeling faint or lightheaded, blurry vision Shortness of breath Side effects that usually do not require medical attention (report to your care team if they continue or are bothersome): Flushing Headache Joint pain Muscle pain Nausea Pain, redness, or irritation at injection site This list may not describe all possible side effects. Call your doctor for medical advice about side effects. You may report side effects to FDA at 1-800-FDA-1088. Where should I keep my medication? This medication is given in a hospital or clinic. It will not be stored at home. NOTE: This sheet is a summary. It may not cover all possible information. If you have questions about this medicine, talk to your doctor, pharmacist, or health care provider.  2024 Elsevier/Gold Standard (2023-04-19 00:00:00)

## 2023-09-11 ENCOUNTER — Inpatient Hospital Stay: Payer: Medicare Other

## 2023-09-11 VITALS — BP 136/53 | HR 56 | Temp 97.8°F | Resp 18

## 2023-09-11 DIAGNOSIS — D509 Iron deficiency anemia, unspecified: Secondary | ICD-10-CM | POA: Diagnosis not present

## 2023-09-11 MED ORDER — SODIUM CHLORIDE 0.9 % IV SOLN: Freq: Once | INTRAVENOUS | Status: AC

## 2023-09-11 MED ORDER — SODIUM CHLORIDE 0.9 % IV SOLN
300.0000 mg | Freq: Once | INTRAVENOUS | Status: AC
  Administered 2023-09-11: 300 mg via INTRAVENOUS
  Filled 2023-09-11: qty 300

## 2023-09-11 NOTE — Progress Notes (Unsigned)
Patient observed for 30 min following Venofer infusion.  Tolerated treatment well without incident.  VSS at discharge, ambulated to lobby.

## 2023-09-11 NOTE — Patient Instructions (Signed)
Iron Sucrose Injection What is this medication? IRON SUCROSE (EYE ern SOO krose) treats low levels of iron (iron deficiency anemia) in people with kidney disease. Iron is a mineral that plays an important role in making red blood cells, which carry oxygen from your lungs to the rest of your body. This medicine may be used for other purposes; ask your health care provider or pharmacist if you have questions. COMMON BRAND NAME(S): Venofer What should I tell my care team before I take this medication? They need to know if you have any of these conditions: Anemia not caused by low iron levels Heart disease High levels of iron in the blood Kidney disease Liver disease An unusual or allergic reaction to iron, other medications, foods, dyes, or preservatives Pregnant or trying to get pregnant Breastfeeding How should I use this medication? This medication is for infusion into a vein. It is given in a hospital or clinic setting. Talk to your care team about the use of this medication in children. While this medication may be prescribed for children as young as 2 years for selected conditions, precautions do apply. Overdosage: If you think you have taken too much of this medicine contact a poison control center or emergency room at once. NOTE: This medicine is only for you. Do not share this medicine with others. What if I miss a dose? Keep appointments for follow-up doses. It is important not to miss your dose. Call your care team if you are unable to keep an appointment. What may interact with this medication? Do not take this medication with any of the following: Deferoxamine Dimercaprol Other iron products This medication may also interact with the following: Chloramphenicol Deferasirox This list may not describe all possible interactions. Give your health care provider a list of all the medicines, herbs, non-prescription drugs, or dietary supplements you use. Also tell them if you smoke,  drink alcohol, or use illegal drugs. Some items may interact with your medicine. What should I watch for while using this medication? Visit your care team regularly. Tell your care team if your symptoms do not start to get better or if they get worse. You may need blood work done while you are taking this medication. You may need to follow a special diet. Talk to your care team. Foods that contain iron include: whole grains/cereals, dried fruits, beans, or peas, leafy green vegetables, and organ meats (liver, kidney). What side effects may I notice from receiving this medication? Side effects that you should report to your care team as soon as possible: Allergic reactions--skin rash, itching, hives, swelling of the face, lips, tongue, or throat Low blood pressure--dizziness, feeling faint or lightheaded, blurry vision Shortness of breath Side effects that usually do not require medical attention (report to your care team if they continue or are bothersome): Flushing Headache Joint pain Muscle pain Nausea Pain, redness, or irritation at injection site This list may not describe all possible side effects. Call your doctor for medical advice about side effects. You may report side effects to FDA at 1-800-FDA-1088. Where should I keep my medication? This medication is given in a hospital or clinic. It will not be stored at home. NOTE: This sheet is a summary. It may not cover all possible information. If you have questions about this medicine, talk to your doctor, pharmacist, or health care provider.  2024 Elsevier/Gold Standard (2023-04-19 00:00:00)

## 2023-09-12 ENCOUNTER — Encounter: Payer: Self-pay | Admitting: Hematology and Oncology

## 2023-09-18 ENCOUNTER — Telehealth: Payer: Self-pay | Admitting: Hematology and Oncology

## 2023-09-18 NOTE — Telephone Encounter (Signed)
Patient is aware of rescheduled appointment times/dates due to [facility] being out of office on 12/11- 12/13

## 2023-10-08 ENCOUNTER — Encounter: Payer: Self-pay | Admitting: Hematology and Oncology

## 2023-10-10 ENCOUNTER — Other Ambulatory Visit: Payer: Self-pay | Admitting: Hematology and Oncology

## 2023-11-06 ENCOUNTER — Ambulatory Visit: Payer: Medicare Other | Admitting: Hematology and Oncology

## 2023-11-06 ENCOUNTER — Other Ambulatory Visit: Payer: Medicare Other

## 2023-11-13 ENCOUNTER — Inpatient Hospital Stay (HOSPITAL_BASED_OUTPATIENT_CLINIC_OR_DEPARTMENT_OTHER): Payer: Medicare Other | Admitting: Hematology and Oncology

## 2023-11-13 ENCOUNTER — Other Ambulatory Visit: Payer: Self-pay

## 2023-11-13 ENCOUNTER — Inpatient Hospital Stay: Payer: Medicare Other | Attending: Hematology and Oncology

## 2023-11-13 ENCOUNTER — Encounter: Payer: Self-pay | Admitting: Hematology and Oncology

## 2023-11-13 VITALS — BP 146/79 | HR 63 | Temp 97.5°F | Resp 17 | Wt 230.0 lb

## 2023-11-13 DIAGNOSIS — Z9011 Acquired absence of right breast and nipple: Secondary | ICD-10-CM | POA: Diagnosis not present

## 2023-11-13 DIAGNOSIS — Z7982 Long term (current) use of aspirin: Secondary | ICD-10-CM | POA: Diagnosis not present

## 2023-11-13 DIAGNOSIS — Z170 Estrogen receptor positive status [ER+]: Secondary | ICD-10-CM

## 2023-11-13 DIAGNOSIS — C50211 Malignant neoplasm of upper-inner quadrant of right female breast: Secondary | ICD-10-CM | POA: Insufficient documentation

## 2023-11-13 DIAGNOSIS — Z79811 Long term (current) use of aromatase inhibitors: Secondary | ICD-10-CM | POA: Insufficient documentation

## 2023-11-13 DIAGNOSIS — D509 Iron deficiency anemia, unspecified: Secondary | ICD-10-CM

## 2023-11-13 DIAGNOSIS — C50911 Malignant neoplasm of unspecified site of right female breast: Secondary | ICD-10-CM

## 2023-11-13 DIAGNOSIS — Z79899 Other long term (current) drug therapy: Secondary | ICD-10-CM | POA: Diagnosis not present

## 2023-11-13 LAB — CMP (CANCER CENTER ONLY)
ALT: 11 U/L (ref 0–44)
AST: 14 U/L — ABNORMAL LOW (ref 15–41)
Albumin: 3.8 g/dL (ref 3.5–5.0)
Alkaline Phosphatase: 85 U/L (ref 38–126)
Anion gap: 5 (ref 5–15)
BUN: 15 mg/dL (ref 8–23)
CO2: 29 mmol/L (ref 22–32)
Calcium: 9.4 mg/dL (ref 8.9–10.3)
Chloride: 101 mmol/L (ref 98–111)
Creatinine: 1.04 mg/dL — ABNORMAL HIGH (ref 0.44–1.00)
GFR, Estimated: 54 mL/min — ABNORMAL LOW (ref >60)
Glucose, Bld: 120 mg/dL — ABNORMAL HIGH (ref 70–99)
Potassium: 4.2 mmol/L (ref 3.5–5.1)
Sodium: 135 mmol/L (ref 135–145)
Total Bilirubin: 0.5 mg/dL (ref <1.2)
Total Protein: 6.8 g/dL (ref 6.5–8.1)

## 2023-11-13 LAB — CBC WITH DIFFERENTIAL (CANCER CENTER ONLY)
Abs Immature Granulocytes: 0.01 10*3/uL (ref 0.00–0.07)
Basophils Absolute: 0 10*3/uL (ref 0.0–0.1)
Basophils Relative: 1 %
Eosinophils Absolute: 0.1 10*3/uL (ref 0.0–0.5)
Eosinophils Relative: 2 %
HCT: 36.4 % (ref 36.0–46.0)
Hemoglobin: 12.2 g/dL (ref 12.0–15.0)
Immature Granulocytes: 0 %
Lymphocytes Relative: 34 %
Lymphs Abs: 2.2 10*3/uL (ref 0.7–4.0)
MCH: 27.9 pg (ref 26.0–34.0)
MCHC: 33.5 g/dL (ref 30.0–36.0)
MCV: 83.1 fL (ref 80.0–100.0)
Monocytes Absolute: 0.5 10*3/uL (ref 0.1–1.0)
Monocytes Relative: 8 %
Neutro Abs: 3.6 10*3/uL (ref 1.7–7.7)
Neutrophils Relative %: 55 %
Platelet Count: 258 10*3/uL (ref 150–400)
RBC: 4.38 MIL/uL (ref 3.87–5.11)
RDW: 17.4 % — ABNORMAL HIGH (ref 11.5–15.5)
WBC Count: 6.5 10*3/uL (ref 4.0–10.5)
nRBC: 0 % (ref 0.0–0.2)

## 2023-11-13 LAB — IRON AND IRON BINDING CAPACITY (CC-WL,HP ONLY)
Iron: 78 ug/dL (ref 28–170)
Saturation Ratios: 24 % (ref 10.4–31.8)
TIBC: 319 ug/dL (ref 250–450)
UIBC: 241 ug/dL (ref 148–442)

## 2023-11-13 NOTE — Progress Notes (Signed)
[facility]  Telephone:([phone]) [phone] Fax:([phone]) [phone]     ID: ZELL GLOOR DOB: 12/08/1940  MR#: [id number]  JYN#:[id number]  Patient Care Team: Zoila Shutter, MD as PCP - General (Internal Medicine) Glenna Fellows, MD (Inactive) as Consulting Physician (General Surgery) Antony Blackbird, MD as Consulting Physician (Radiation Oncology) Coralyn Pear, MD as Referring Physician (Internal Medicine) Bensimhon, Bevelyn Buckles, MD as Consulting Physician (Cardiology) Annice Pih., MD as Consulting Physician (Urology) Allena Katz, Janus Delene Ruffini, MD as Consulting Physician (Pain Medicine) Rachel Moulds, MD as Consulting Physician (Hematology and Oncology)  CHIEF COMPLAINT: 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  History of Present Illness    Patient with history of breast cancer and iron deficiency anemia is here for a follow-up.  She received IV iron and felt a bit better but she continues to have chronic back pains and ongoing fatigue.  She denies any hematochezia or melena.  No other concerning review of systems mentioned today.    COVID 19 VACCINATION STATUS: Status post Pfizer x2+ booster September 2021 MI: Also had COVID August 2022   BREAST CANCER HISTORY: From the original intake note:  "[name]" herself noted a change around her nipple on the right and brought it to medical attention. She was set up for bilateral diagnostic mammography with tomography and right breast ultrasonography at [facility] 05/23/2017. The breast density was category C. In the right breast upper inner quadrant there was a 2.5 cm area of grouped pleomorphic calcifications correlating with the palpable mass. Also there was a 1.2 cm macrolobulated mass in the upper inner quadrant also  in the palpable region. These masses were identifiable by ultrasound with the same dimensions. The right axilla was sonographically negative.  On 05/27/2017 the patient underwent biopsy of the 2 masses in question. 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  The patient's subsequent history is as detailed below.   PAST MEDICAL HISTORY: Past Medical History:  Diagnosis Date   Anemia    Arthritis    osteo, rheumatoid   Back pain    Breast cancer (HCC)    Cancer (HCC)    Depression 02/22/2020   Diabetes (HCC)    Diverticulosis    Dysrhythmia    hx rapid heartbeat, no cardiologist   Family history of breast cancer    Family history of prostate cancer    Fibromyalgia    peripheral neuropathy   Fibromyalgia    Fibromyalgia    Gallbladder problem    GERD (gastroesophageal reflux disease)    H/O hiatal hernia    Hyperlipidemia    Joint pain    Lower extremity edema    Obesity    Osteoarthritis    PONV (postoperative nausea and vomiting)    "THROAT WITH EXTREME QMVHQIO"9629, ulnar nerve surgery   Rheumatoid arthritis (HCC)    RLS (restless legs syndrome)    Sciatica    Shortness of breath dyspnea    with  exertion   Slipped disc in neck    UTI (lower urinary tract infection)     PAST SURGICAL HISTORY: Past Surgical History:  Procedure Laterality Date   ABDOMINAL HYSTERECTOMY     APPENDECTOMY     back injection      July 2018- x1   BREAST SURGERY Left 1986   biopsy, Recent 05/2017 at [facility]- had biopsy on R breast- malignant    CARDIAC CATHETERIZATION  2009   "no blockage  per pt"   CHOLECYSTECTOMY     CYSTOSCOPY WITH BIOPSY     INCONTINENCE SURGERY     IRRIGATION AND DEBRIDEMENT ABSCESS Right 01/28/2018   Procedure: IRRIGATION AND DEBRIDEMENT AXILLARY ABSCESS;  Surgeon: Glenna Fellows, MD;  Location: [facility];  Service: General;  Laterality: Right;   JOINT REPLACEMENT     KNEE ARTHROSCOPY     bilateral   KNEE ARTHROSCOPY Left 03/17/2014   Procedure: ARTHROSCOPY LEFT KNEE WITH SYNOVECTOMY;  Surgeon: Loanne Drilling, MD;  Location: [facility];  Service: Orthopedics;  Laterality: Left;   PORTACATH PLACEMENT Left 07/02/2017   Procedure: INSERTION PORT-A-CATH;  Surgeon: Glenna Fellows, MD;  Location: [facility];  Service: General;  Laterality: Left;   RIGHT/LEFT HEART CATH AND CORONARY ANGIOGRAPHY N/A 08/30/2017   Procedure: RIGHT/LEFT HEART CATH AND CORONARY ANGIOGRAPHY;  Surgeon: Laurey Morale, MD;  Location: [facility] INVASIVE CV LAB;  Service: Cardiovascular;  Laterality: N/A;   ROTATOR CUFF REPAIR     right   SIMPLE MASTECTOMY WITH AXILLARY SENTINEL NODE BIOPSY Right 07/02/2017   Procedure: RIGHT TOTAL  MASTECTOMY WITH AXILLARY SENTINEL LYMPH  NODE BIOPSY;  Surgeon: Glenna Fellows, MD;  Location: [facility];  Service: General;  Laterality: Right;   TOTAL KNEE ARTHROPLASTY  08/04/2012   Procedure: TOTAL KNEE ARTHROPLASTY;  Surgeon: Loanne Drilling, MD;  Location: [facility];  Service: Orthopedics;  Laterality: Left;   TOTAL KNEE ARTHROPLASTY Right 04/11/2015   Procedure: RIGHT TOTAL KNEE ARTHROPLASTY;  Surgeon: Ollen Gross, MD;  Location: [facility];  Service: Orthopedics;  Laterality: Right;   ulnar nerve surgery Right 1986    FAMILY HISTORY Family History  Problem Relation Age of Onset   Hypertension Mother    Stroke Mother    Breast cancer Mother 33       died at 72   Diabetes Mother    Hyperlipidemia Mother    Heart disease Mother    Cancer Mother    Obesity Mother    Hypertension Father    Heart attack Father        died at 64   Sudden death Father     Hypertension Brother        MI   Prostate cancer Brother 73   Hypertension Brother        MI   Lung cancer Brother        possible lung cancer, spot found on lung being monitored   Hypertension Brother        MI   Non-Hodgkin's lymphoma Sister 2   Breast cancer Other 77       is currently 74   Breast cancer Other 23   Cancer Cousin 62       type of cancer unk.    Cancer Other        type unknown, eye/face    Cancer Other 65       died from cancer in his 29's, type unknown  The patient's father died from a heart attack at age 51.  The patient's mother was diagnosed with breast cancer at age 49 and died at age 39. The patient had 5 brothers, 3 sisters. One sister had breast cancer at age 69. One brother was diagnosed with prostate cancer at age 55. One Brother died from lung cancer at age 89. One sister had non-Hodgkin's lymphoma diagnosed at age 78.    GYNECOLOGIC HISTORY:  No LMP recorded. Patient has had a hysterectomy.  menarche age 86, first live birth age 11. All 3 of her births were premature including 1 set of twins. One of the twins died a few hours after birth. She underwent hysterectomy in 1981 with unilateral salpingo-oophorectomy. 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  She has led groups as far away as [place] and [place] and has led many in the [place] cruises as well.  Her husband, Earvin Hansen began a business installing fences which is now carried on by his son Joselyn Glassman. Earvin Hansen is also the church organist. Son, Feliz Beam is a truck Hospital doctor in [employer]. The patient has one grandchild. She is a International aid/development worker.     ADVANCED DIRECTIVES: In place. The patient's husband is her healthcare power of attorney   HEALTH MAINTENANCE: Social History   Tobacco Use   Smoking status: Never   Smokeless tobacco: Never  Substance Use Topics   Alcohol use: No   Drug use:  No     Colonoscopy:2017 / [place]   PAP:  Bone density: 2019/ [facility] normal   Allergies  Allergen Reactions   Gabapentin Anxiety   Pyridium [Phenazopyridine Hcl]     Nausea  Vomiting diarrhea   Atorvastatin Other (See Comments)    myalgias    Duloxetine Other (See Comments)    UNSPECIFIED REACTION    Nsaids Rash   Oseltamivir Diarrhea and Other (See Comments)    Abdominal pain     Current Outpatient Medications  Medication Sig Dispense Refill   acetaminophen (TYLENOL) 500 MG tablet Take 500 mg by mouth every 6 (six) hours as needed for moderate pain or headache.      anastrozole (ARIMIDEX) 1 MG tablet Take 1 tablet (1 mg total) by mouth daily. 90 tablet 2   aspirin EC 81 MG tablet Take 81 mg by mouth daily after breakfast.      atenolol (TENORMIN) 50 MG tablet Take 50 mg by mouth daily before breakfast.      buPROPion (WELLBUTRIN XL) 150 MG 24 hr tablet Take 150 mg by mouth daily before breakfast.     Docusate Calcium (STOOL SOFTENER PO) Take by mouth as needed.     ergocalciferol (VITAMIN D2) 1.25 MG (50000 UT) capsule Take 1 capsule (50,000 Units total) by mouth once a week. 8 capsule 0   ferrous sulfate 325 (65 FE) MG tablet Take 1 tablet (325 mg total) by mouth 2 (two) times daily with a meal. (Patient taking differently: Take 325 mg by mouth daily after breakfast.) 60 tablet 0   fexofenadine (ALLEGRA) 180 MG tablet Take 1 tablet (180 mg total) by mouth daily. 30 tablet 0   fluticasone (FLONASE) 50 MCG/ACT nasal spray Place 2 sprays into both nostrils daily. 16 g 0   folic acid (FOLVITE) 1 MG tablet Take 1 mg by mouth daily after breakfast.      Ibuprofen 200 MG CAPS Take by mouth daily as needed.     Meth-Hyo-M Bl-Na Phos-Ph  Sal (URO-MP PO) Take by mouth as needed.     omeprazole (PRILOSEC) 40 MG capsule Take 40 mg by mouth every morning.     polyethylene glycol (MIRALAX) 17 g packet Take 17 g by mouth daily. 14 each 0   traMADol (ULTRAM) 50 MG tablet Take 50 mg by mouth  every 6 (six) hours as needed for moderate pain or severe pain.      traZODone (DESYREL) 50 MG tablet Take 50 mg by mouth at bedtime.      No current facility-administered medications for this visit.    OBJECTIVE: White woman who appears stated age  Vitals:   11/13/23 1408  BP: (!) 146/79  Pulse: 63  Resp: 17  Temp: (!) 97.5 F (36.4 C)  SpO2: 98%       Body mass index is 40.74 kg/m.   Wt Readings from Last 3 Encounters:  11/13/23 230 lb (104.3 kg)  08/19/23 230 lb 12.8 oz (104.7 kg)  07/09/22 235 lb 1 oz (106.6 kg)   Sclerae unicteric, EOMs intact Neck: No palpable adenopathy. Chest: CTA Heart: RRR Lower extremities: No lower extremity edema  LAB RESULTS:  CMP     Component Value Date/Time   NA 135 08/19/2023 0802   NA 131 (L) 09/06/2017 1018   K 4.1 08/19/2023 0802   K 4.2 09/06/2017 1018   CL 102 08/19/2023 0802   CO2 26 08/19/2023 0802   CO2 25 09/06/2017 1018   GLUCOSE 188 (H) 08/19/2023 0802   GLUCOSE 187 (H) 09/06/2017 1018   BUN 15 08/19/2023 0802   BUN 11.2 09/06/2017 1018   CREATININE 1.10 (H) 08/19/2023 0802   CREATININE 1.2 (H) 09/06/2017 1018   CALCIUM 9.1 08/19/2023 0802   CALCIUM 9.3 09/06/2017 1018   PROT 6.9 08/19/2023 0802   PROT 6.9 09/06/2017 1018   ALBUMIN 3.7 08/19/2023 0802   ALBUMIN 3.4 (L) 09/06/2017 1018   AST 11 (L) 08/19/2023 0802   AST 20 09/06/2017 1018   ALT 8 08/19/2023 0802   ALT 14 09/06/2017 1018   ALKPHOS 92 08/19/2023 0802   ALKPHOS 103 09/06/2017 1018   BILITOT 0.4 08/19/2023 0802   BILITOT 0.57 09/06/2017 1018   GFRNONAA 50 (L) 08/19/2023 0802   GFRAA >60 02/24/2020 0536   GFRAA 59 (L) 02/21/2018 1139    No results found for: "TOTALPROTELP", "ALBUMINELP", "A1GS", "A2GS", "BETS", "BETA2SER", "GAMS", "MSPIKE", "SPEI"  No results found for: "KPAFRELGTCHN", "LAMBDASER", "KAPLAMBRATIO"  Lab Results  Component Value Date   WBC 6.5 11/13/2023   NEUTROABS 3.6 11/13/2023   HGB 12.2 11/13/2023   HCT 36.4  11/13/2023   MCV 83.1 11/13/2023   PLT 258 11/13/2023      Chemistry      Component Value Date/Time   NA 135 08/19/2023 0802   NA 131 (L) 09/06/2017 1018   K 4.1 08/19/2023 0802   K 4.2 09/06/2017 1018   CL 102 08/19/2023 0802   CO2 26 08/19/2023 0802   CO2 25 09/06/2017 1018   BUN 15 08/19/2023 0802   BUN 11.2 09/06/2017 1018   CREATININE 1.10 (H) 08/19/2023 0802   CREATININE 1.2 (H) 09/06/2017 1018      Component Value Date/Time   CALCIUM 9.1 08/19/2023 0802   CALCIUM 9.3 09/06/2017 1018   ALKPHOS 92 08/19/2023 0802   ALKPHOS 103 09/06/2017 1018   AST 11 (L) 08/19/2023 0802   AST 20 09/06/2017 1018   ALT 8 08/19/2023 0802   ALT 14 09/06/2017 1018  BILITOT 0.4 08/19/2023 0802   BILITOT 0.57 09/06/2017 1018       No results found for: "LABCA2"  No components found for: "[id number]"  No results for input(s): "INR" in the last 168 hours.  Urinalysis    Component Value Date/Time   COLORURINE YELLOW 04/30/2022 1027   APPEARANCEUR CLEAR 04/30/2022 1027   LABSPEC 1.015 04/30/2022 1027   PHURINE 7.0 04/30/2022 1027   GLUCOSEU NEGATIVE 04/30/2022 1027   HGBUR TRACE (A) 04/30/2022 1027   BILIRUBINUR NEGATIVE 04/30/2022 1027   KETONESUR NEGATIVE 04/30/2022 1027   PROTEINUR NEGATIVE 04/30/2022 1027   UROBILINOGEN 0.2 04/06/2015 1332   NITRITE NEGATIVE 04/30/2022 1027   LEUKOCYTESUR TRACE (A) 04/30/2022 1027    STUDIES: No results found.   ELIGIBLE FOR AVAILABLE RESEARCH PROTOCOL: No  ASSESSMENT: 82 y.o. [place] woman status post right breast upper inner quadrant biopsy 2 on 05/27/2017 showing a clinical T1c N0, stage IA invasive ductal carcinoma, grade 3, estrogen and progesterone receptor positive, HER-2 amplified, with an MIB-1 of 60%  (1) genetics testing 07/14/2017 through the Multi-Cancer panel + Breast Cancer Panel found no deleterious mutations in  ALK, APC,AKT1, ATM, AXIN2,BAP1,  BARD1, BLM, BMPR1A, BRCA1, BRCA2, BRIP1, CASR, CDC73, CDH1, CDK4,  CDKN1B, CDKN1C, CDKN2A (p14ARF), CDKN2A (p16INK4a), CEBPA, CHEK2, CTNNA1, DICER1, DIS3L2, EGFR (c.2369C>T, p.Thr790Met variant only), EPCAM (Deletion/duplication testing only), FH, FAM175A, FLCN, GATA2, GPC3, GREM1 (Promoter region deletion/duplication testing only), HOXB13 (c.251G>A, p.Gly84Glu), HRAS, KIT, MAX, MEN1, MET, MITF (c.952G>A, p.Glu318Lys variant only), MLH1, MSH2, MSH3, MSH6, MUTYH, NBN, NF1, NF2, NTHL1, PALB2, PDGFRA, PHOX2B, PMS2, POLD1, POLE, POT1, PRKAR1A, PTCH1, PTEN, RAD50, RAD51C, RAD51D, RB1, RECQL4, RET, RUNX1, SDHAF2, SDHA (sequence changes only), SDHB, SDHC, SDHD, SMAD4, SMARCA4, SMARCB1, SMARCE1, STK11, SUFU, TERC, TERT, TMEM127, TP53, TSC1, TSC2, VHL, WRN, WT1, FANCC, MRE11, PIK3CA, RINT1, XRCC2.  (a)  3 Variants of Uncertain significance (VUS) were identified.  BRCA1 c.2551G>A (p.Glu851Lys)----CHEK2 c.679G>A (p.Gly227Arg).  The CHECK 2 variant has been reported as possibly mosaic--- SDHA c.704T>C (p.Ile235Thr).  (2) anastrozole started 06/05/2017, to be continued and minimum of 5 years   (a) bone density 06/03/2018 showed a T score of -0.5 (normal).  (3) status post right mastectomy and sentinel lymph node sampling 07/02/2017 for an mpT2 pN0, stage IB invasive ductal carcinoma, grade 3, with negative margins.  (a) the patient opted against reconstruction  (4) planned trastuzumab for 6 months started 08/09/2017, discontinued after one dose with significant reaction  (a) baseline echocardiogram 06/19/2017 shows an excellent ejection fraction  (b) Echocardiogram 09/16/2017 showed an ejection fraction of 60-65%  (5) started lapatinib 12/02/2017-- continued 6 months (completed July 2019)  (a) echocardiogram 01/21/2018 shows an ejection fraction in the 60-65%.  (b) echocardiogram 06/02/2018 showed an ejection fraction in the 55-60% range   PLAN:  Breast cancer Glena Norfolk is now 6 years out from definitive surgery for her breast cancer with no evidence of disease recurrence.   This is very favorable. Last mammogram in September without any concerns. She will continue annual mammograms  Iron Deficiency Anemia She now has normal hemoglobin at 12.2 g/dL after 3 doses of Venofer.  She tolerated this well.  At this time I do not see any further indication for iron supplementation.  She can return to clinic in approximately 6 months for follow-up.  Vitamin B12 Deficiency Identified deficiency with no current supplementation. -Start over-the-counter B12 supplement at 1000 micrograms daily.  Vitamin D Deficiency She is now on oral vitamin D supplementation daily, 1000 to 2000 international units  *Total Encounter Time  as defined by the Centers for Medicare and Medicaid Services includes, in addition to the face-to-face time of a patient visit (documented in the note above) non-face-to-face time: obtaining and reviewing outside history, ordering and reviewing medications, tests or procedures, care coordination (communications with other health care professionals or caregivers) and documentation in the medical record.

## 2023-11-14 LAB — FERRITIN: Ferritin: 43 ng/mL (ref 11–307)

## 2024-04-21 NOTE — Progress Notes (Signed)
 Subjective:   Patient ID: Kiara Brown is a 83 y.o. female.  Patient presents for medicare annual wellness visit and management of chronic medical condtions. PMHX significant CAD, CHF, HTN, DM II, RA, anemia, HLD, obesity. .She needs lab work today. Vitals stable at home. BP up a bit but she has white coat syndrome.  Her mammogram is up to date sees oncology per [facility]. Colonoscopy 08/2013 declines further. Dexa per GYN.    RSV Tdap and shingles per pharmacy if wishes.  Patient does note 1 day hx of dysuria. She did see urology and was placed on hibrex bid. She does have fu with them set up.      Review of Systems  Constitutional:  Negative for chills and fever.  Respiratory:  Negative for chest tightness and shortness of breath.       Objective:   Physical Exam Constitutional:      Appearance: Normal appearance.  HENT:     Head: Normocephalic and atraumatic.  Cardiovascular:     Rate and Rhythm: Normal rate and regular rhythm.     Heart sounds: Normal heart sounds.  Pulmonary:     Effort: Pulmonary effort is normal.     Breath sounds: Normal breath sounds.  Skin:    General: Skin is warm and dry.  Neurological:     Mental Status: She is alert. Mental status is at baseline.      Diabetic Foot Exam Performed: Yes Diabetic Foot Exam Result:  []  Normal The patient's foot has been visually inspected with no ulcers, deformities, or abnormal toenails.  The patient can see the bottom of his/her feet and shoes fit properly. The patient can feel the 10g nylon filament on both feet for the 1st, 3rd, 5th metatarsals and big toes.  The patient's pedal pulses are palpable on both feet at dorsalis pedis and/or posterior tibial.  (If normal selected no need to complete below this line.) []  Abnormal  [x]  Abnormal with neuropathy []  Bilateral amputation lower extremities   Foot Exam  Yes  No  Comment  Is there a foot ulcer now?  []  [x]    Is there a history of foot ulcers? []  [x]     Is there toe deformity? []  []    Is there abnormal shape?  []  []    Are toenails thick or ingrown? []  []    Can patient see bottom of feet? []  [x]    Are patients shoes improperly fitting?  []  [x]   Right Foot  Normal Abnormal Comment  Monofilament Left Foot Normal Abnormal Comment  5th Metatarsal []  [x]     5th Metatarsal []  [x]    3rd Metatarsal []  [x]    3rd Metatarsal []  [x]    1st Metatarsal []  [x]    1st Metatarsal []  [x]    Big Toe []  []    Big Toe []  []     Pulses Assessment/Exam Palpable  Doppler Absent N/A - amputation   Comment  Dorsalis pedis - R [x]  []  []  [x]    Dorsalis pedis - L [x]  []  []  [x]    Posterior tibial - R [x]  []  []  [x]    Posterior tibial - L [x]  []  []  [x]      Assessment/Plan:    Diagnoses and all orders for this visit:  Encounter for Medicare annual wellness exam- lab work today  see HPI   Hyperlipidemia, mixed lab work today  -     Lipid Panel; Future  Type 2 diabetes mellitus with stage 3a chronic kidney disease, unspecified whether long term insulin  use    (CMD) lab work today -     Hemoglobin A1C With Estimated Average Glucose; Future -     Magnesium; Future -     Albumin , Random Urine; Future  Chronic diastolic CHF (congestive heart failure) (HCC) lab work today  -     Comprehensive Metabolic Panel; Future -     Magnesium; Future  Coronary artery disease involving native coronary artery of native heart without angina pectoris lab work today  -     Comprehensive Metabolic Panel; Future -     Magnesium; Future  Benign essential hypertension stable when checked at home  -     Comprehensive Metabolic Panel; Future  Rheumatoid arthritis involving multiple sites with positive rheumatoid factor (HCC) continue fu with rheumatology   Vitamin D  deficiency lab work today  -     Vitamin  D, 25-Hydroxy; Future  Mixed hyperlipidemia lab work today   Anemia due to other cause, not classified lab work today  -     CBC with Differential; Future -     Anemia Profile; Future  Vitamin B12 deficiency lab work today  -     Vitamin B12; Future -     Vitamin B12; Future  Screening for thyroid  disorder lab workt oday  -     TSH; Future  Need for influenza vaccination -     Flu,Trivalent,IM, Preservative Free (FLULAVAL, (FLUZONE(SCOT)))  Other abnormality of red blood cells -     Anemia Profile; Future  Dysuria recheck UA and culture today  -     Urine Culture; Future -     Urinalysis with Reflex to Microscopic; Future  Other orders -     methenamine  (HIPREX ) 1 gram tablet; Take 1 g by mouth 2 (two) times a day.    [facility] [place] Medicare Wellness Visit Type:: Subsequent Annual Wellness Visit  Name: Kiara Brown Date of Birth: 1941-08-22 Age: 83 y.o. MRN: [id number] Visit Date: 07/20/2024  History obtained from: patient  Living Arrangements/Support System/Health Assessment/Pain/Stress Marital status: married     Living arrangements: lives with spouse/significant other Does the patient have a support system (family, friend, church, Conservation officer, nature, etc)?: Yes   Do you have any dental concerns?: No In the past month, have you experienced a change in your bladder control?: No   Do you have any difficulty obtaining your medications?: No   Do you have trouble consistently taking or  remembering to take all of your medications as prescribed?: No Patient rates overall stress level as: Some Does stress affect daily life?: (!) Yes Typical amount of pain: some Does pain affect daily life?: (!) Yes Are you currently prescribed opioids?: No                Depression Screening  Behavioral Health Screening  Patient Health Questionnaire-2 Score: 0 (07/20/2024  1:35 PM)  Patient Health Questionnaire-9 Score: 3  (07/20/2024  1:35 PM)   PHQ Question # 9 Thoughts that you would be better off dead or hurting yourself in some way: Not at all    Patient's Depression screening/score = Negative        Social History (Tobacco/Drugs/Sexual Activity) Kiara Brown reports that she has never smoked. She has never used smokeless tobacco. Tobacco Use?: No How many times in the past year have you used a recreational drug or used a prescription medication for nonmedical reasons?: None Risk factors for sexually transmitted infections (i.e., multiple sexual partners): No Are you bothered by sexual problems?: No  Alcohol Screening How often do you have a drink containing alcohol?: Never How many standard drinks containing alcohol do you have on a typical day?: Never, 1 or 2 drinks How often do you have six or more drinks on one occasion?: Never Audit-C Score: 0  Physical Activity Regular exercise?: (!) No      Diet How many meals a day?: 3 Eats fruit and vegetables daily?: Yes Most meals are obtained by: preparing their own meals  Home and Transportation Safety All rugs have non-skid backing?: Yes All stairs or steps have railings?: N/A, no stairs Grab bars in the bathtub or shower?: Yes Have non-skid surface in bathtub or shower?: Yes Good home lighting?: Yes Regular seat belt use?: Yes      Activities of Daily Living Feed self?: Yes Bathe self?: Yes Dress self?: Yes Use toilet without assistance?: Yes Walk without assistance?: Yes    Instrumental Activities of Daily Living Manage finances?: Yes Shop for themselves?: Yes Prepare meals?: Yes Use the telephone?: Yes Manage medications?: Yes   Performs basic housework/laundry?: Yes Drives?: Yes Primary transportation is: driving  Hearing Concerns about hearing?: No Uses hearing aids?: No Hear whispered voice? (Observed): (!) No  Vision      Fall Risk Is the patient ambulatory?: Yes One or more falls in the last year::  No Feels unsteady when walking:: No  Cognitive Assessment Has a diagnosis of dementia or cognitive impairment?: No Are there any memory concerns by the patient, others, or providers?: No              Advance Directives Living will?: Yes   Healthcare POA?: Yes Name of Health Care Agent: Jerri Glauser Relationship to patient: Spouse, Adult child Health Care Agent's phone number: Elna (spouse) [phone], Norman (son) [phone]         Other History I reviewed and updated the following risk factors and conditions as appropriate: Reviewed/Updated: Problem List, Medical History, Surgical History, Family History, Medications, Allergies Reviewed/Updated: Vital Signs (height, weight, and BP), Immunizations, Health Maintenance Patient Care Team Updated: Done  Vital Signs BP 146/80 (BP Location: Left arm)   Pulse 58   Temp 97.6 F (36.4 C) (Temporal)   Wt 103 kg (227 lb)   SpO2 94%   BMI 41.52 kg/m   Screening and Immunizations Health Maintenance Status       Date Due Completion Dates   Diabetes:  Quantitative uACR for Kidney Evaluation  Never done ---   DTaP/Tdap/Td Vaccines (1 - Tdap) Never done ---   ZOSTER VACCINE (1 of 2) 07/20/2024 (Originally 06/21/1991) ---   Adult RSV (60+ Years or Pregnancy) (1 - 1-dose 75+ series) 07/20/2025 (Originally 06/20/2016) ---   COVID-19 Vaccine (5 - 2024-25 season) 07/20/2025 (Originally 03/23/2024) 09/23/2023, 08/03/2020   Bone Density Scan 07/21/2025 (Originally 1941/05/04) ---   Depression Monitoring 12/19/2024 06/18/2024   Diabetes:  eGFR for Kidney Evaluation 01/20/2025 01/21/2024, 07/23/2023   Diabetes: Hemoglobin A1C 01/20/2025 01/21/2024, 01/21/2024   Comprehensive Annual Visit 07/20/2025 07/20/2024, 07/23/2023   Medicare Annual Wellness (AWV) Subsequent Visits 07/20/2025 07/20/2024, 07/23/2023   Diabetes: Foot Exam 07/20/2025 07/20/2024, 07/23/2023   Comment on 07/19/2022: See Legacy System       Immunization History  Administered  Date(s) Administered  . H1n1 All Forms 12/09/2008  . Influenza, High-dose Seasonal, Quadrivalent, Preservative Free 08/17/2013, 11/29/2014, 10/06/2015, 08/09/2016, 10/08/2018, 09/29/2019, 12/04/2022  . Influenza, Unspecified 09/19/2004, 10/02/2005, 09/26/2006, 10/07/2007, 08/10/2008  . Influenza, adjuvanted, trivalent, PF 09/23/2023  . Influenza, high-dose, trivalent, PF 08/17/2013, 11/29/2014, 10/06/2015, 08/09/2016, 10/08/2018  . Influenza, split virus, trivalent, preservative 08/11/2009, 08/30/2010, 08/22/2011, 10/02/2012  . Influenza,split virus, trivalent, PF 07/20/2024  . Novel Influenza H1n1-09 12/09/2008  . Pfizer COVID-19 12+yrs (aka Comirnaty) 09/23/2023  . Pfizer SARS-CoV-2 Primary Series 12+ yrs 01/14/2020, 02/04/2020, 08/03/2020  . Pneumococcal Conjugate 13-Valent 10/06/2015  . Pneumococcal Conjugate Vaccine 20-Valent (PREVNAR-20) 6 wks+ 07/23/2023  . Pneumococcal Polysaccharide Vaccine, 23 Valent (PNEUMOVAX-23) 2Y+ 09/18/2001, 03/10/2010, 09/07/2015    Assessment/Plan: Subsequent Annual Wellness Visit: The topics above were reviewed with the patient.  Healthy lifestyle principles reviewed.  Recommendations provided when indicated.  Follow up 1 year for next wellness visit.  Orders Placed This Encounter  Procedures  . Urine Culture  . Flu,Trivalent,IM, Preservative Free (FLULAVAL, (FLUZONE(SCOT)))  . Comprehensive Metabolic Panel  . CBC with Differential  . Lipid Panel  . Hemoglobin A1C With Estimated Average Glucose  . Vitamin D , 25-Hydroxy  . Vitamin B12  . TSH  . Vitamin B12  . Anemia Profile  . Magnesium  . Albumin , Random Urine  . Urinalysis with Reflex to Microscopic    New Medications Ordered This Visit  Medications  . methenamine  (HIPREX ) 1 gram tablet    Sig: Take 1 g by mouth 2 (two) times a day.    Patient Care Team: Darnelle FORBES Lefevre, MD as PCP - General (Internal Medicine) Eleanor GORMAN Gay, PA-C as PCP - Attributed  Electronically signed by:  Eleanor GORMAN Gay, PA-C 07/20/2024 2:18 PM

## 2024-04-29 ENCOUNTER — Other Ambulatory Visit: Payer: Self-pay

## 2024-04-29 ENCOUNTER — Ambulatory Visit
Admission: EM | Admit: 2024-04-29 | Discharge: 2024-04-29 | Disposition: A | Discharge status: Home / Self Care | Attending: Physician Assistant | Admitting: Physician Assistant

## 2024-04-29 DIAGNOSIS — R319 Hematuria, unspecified: Secondary | ICD-10-CM | POA: Diagnosis present

## 2024-04-29 DIAGNOSIS — R300 Dysuria: Secondary | ICD-10-CM

## 2024-04-29 DIAGNOSIS — N390 Urinary tract infection, site not specified: Secondary | ICD-10-CM | POA: Diagnosis present

## 2024-04-29 LAB — POCT URINALYSIS DIP (MANUAL ENTRY)
Bilirubin, UA: NEGATIVE
Glucose, UA: NEGATIVE mg/dL
Ketones, POC UA: NEGATIVE mg/dL
Nitrite, UA: NEGATIVE
Protein Ur, POC: NEGATIVE mg/dL
Spec Grav, UA: 1.015 (ref 1.010–1.025)
Urobilinogen, UA: 0.2 U/dL
pH, UA: 6.5 (ref 5.0–8.0)

## 2024-04-29 MED ORDER — SULFAMETHOXAZOLE-TRIMETHOPRIM 800-160 MG PO TABS: 1.0000 | ORAL_TABLET | Freq: Two times a day (BID) | ORAL | 0 refills | Status: DC

## 2024-04-29 NOTE — Discharge Instructions (Addendum)
 Based on your symptoms and results of the urinalysis I believe you have a UTI I recommend the following:  I have sent in a script for Bactrim  to be taken by mouth twice per day for 3 days.  Please finish the entire course of the antibiotic even if you are feeling better before it is completed unless you develop an allergic reaction or are told by a medical provider to stop taking it. Please limit your use of the URO-MP while on this antibiotic as this combination can cause unwanted side effects.  We have sent a sample of your urine off for a urine culture.  This will help us  determine what bacteria is causing your symptoms as well as the most appropriate antibiotic to treat it.  If we need to make any adjustments to your medication regimen and new medication will be sent to the pharmacy on file and you will be updated via phone call in MyChart. Stay well hydrated (at least 75 oz of water per day) and avoid holding your urine for prolonged periods of time. If you have any of the following please return to urgent care or go to the emergency room: Persistent symptoms, fever, trouble urinating or inability to urinate, confusion, flank pain.

## 2024-04-29 NOTE — ED Provider Notes (Signed)
 Kiara Brown    CSN: [id number] Arrival date & time: 04/29/24  1457      History   Chief Complaint Chief Complaint  Patient presents with   Urinary Frequency    HPI Kiara Brown is a 83 y.o. female.   HPI  Patient presents today with concerns for UTI. She reports pain with urination as well as increased urinary frequency for the past 2 days. She does report some suprapubic pressure but denies abdominal pain   Interventions: URO-MP   Past Medical History:  Diagnosis Date   Anemia    Arthritis    osteo, rheumatoid   Back pain    Breast cancer (HCC)    Cancer (HCC)    Depression 02/22/2020   Diabetes (HCC)    Diverticulosis    Dysrhythmia    hx rapid heartbeat, no cardiologist   Family history of breast cancer    Family history of prostate cancer    Fibromyalgia    peripheral neuropathy   Fibromyalgia    Fibromyalgia    Gallbladder problem    GERD (gastroesophageal reflux disease)    H/O hiatal hernia    Hyperlipidemia    Joint pain    Lower extremity edema    Obesity    Osteoarthritis    PONV (postoperative nausea and vomiting)    "THROAT WITH EXTREME QIONGEX"5284, ulnar nerve surgery   Rheumatoid arthritis (HCC)    RLS (restless legs syndrome)    Sciatica    Shortness of breath dyspnea    with exertion   Slipped disc in neck    UTI (lower urinary tract infection)     Patient Active Problem List   Diagnosis Date Noted   IDA (iron  deficiency anemia) 08/19/2023   Vitamin D  deficiency 07/03/2021   Other chronic pain 07/03/2021   Sepsis secondary to UTI (HCC) 02/22/2020   CAD (coronary artery disease) 02/22/2020   Depression 02/22/2020   Abscess 01/27/2018   Severe obesity (HCC) 12/02/2017   Anemia, normocytic normochromic 08/10/2017   Chest pain 08/10/2017   Essential hypertension 08/10/2017   CKD (chronic kidney disease), 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   Type II diabetes mellitus (HCC) 08/10/2017   Genetic testing 07/10/2017   Stage I  breast cancer, right (HCC) 07/02/2017   Family history of breast cancer    Family history of prostate cancer    Malignant neoplasm of upper-inner quadrant of right breast in female, 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   Synovitis of knee 03/16/2014   Postop Hypokalemia 08/07/2012   Postop Hyponatremia 08/06/2012   OA (osteoarthritis) of knee 08/04/2012    Past Surgical History:  Procedure Laterality Date   ABDOMINAL HYSTERECTOMY     APPENDECTOMY     back injection      July 2018- x1   BREAST SURGERY Left 1986   biopsy, Recent 05/2017 at [facility]- had biopsy on R breast- malignant    CARDIAC CATHETERIZATION  2009   "no blockage per pt"   CHOLECYSTECTOMY     CYSTOSCOPY WITH BIOPSY     INCONTINENCE SURGERY     IRRIGATION AND DEBRIDEMENT ABSCESS Right 01/28/2018   Procedure: IRRIGATION AND DEBRIDEMENT AXILLARY ABSCESS;  Surgeon: Ayesha Lente, MD;  Location: [facility];  Service: General;  Laterality: Right;   JOINT REPLACEMENT     KNEE ARTHROSCOPY     bilateral   KNEE ARTHROSCOPY Left 03/17/2014   Procedure: ARTHROSCOPY LEFT KNEE WITH SYNOVECTOMY;  Surgeon: Aurther Blue, MD;  Location: [facility];  Service:  Orthopedics;  Laterality: Left;   PORTACATH PLACEMENT Left 07/02/2017   Procedure: INSERTION PORT-A-CATH;  Surgeon: Ayesha Lente, MD;  Location: [facility];  Service: General;  Laterality: Left;   RIGHT/LEFT HEART CATH AND CORONARY ANGIOGRAPHY N/A 08/30/2017   Procedure: RIGHT/LEFT HEART CATH AND CORONARY ANGIOGRAPHY;  Surgeon: Darlis Eisenmenger, MD;  Location: [facility] INVASIVE CV LAB;  Service: Cardiovascular;  Laterality: N/A;   ROTATOR CUFF REPAIR     right   SIMPLE MASTECTOMY WITH AXILLARY SENTINEL NODE BIOPSY Right 07/02/2017   Procedure: RIGHT TOTAL  MASTECTOMY WITH AXILLARY SENTINEL LYMPH  NODE BIOPSY;  Surgeon: Ayesha Lente, MD;  Location: [facility] OR;  Service: General;  Laterality: Right;   TOTAL KNEE ARTHROPLASTY  08/04/2012   Procedure: TOTAL KNEE ARTHROPLASTY;  Surgeon: Aurther Blue, MD;  Location: [facility];  Service: Orthopedics;  Laterality: Left;   TOTAL KNEE ARTHROPLASTY Right 04/11/2015   Procedure: RIGHT TOTAL KNEE ARTHROPLASTY;  Surgeon: Liliane Rei, MD;  Location: [facility];  Service: Orthopedics;  Laterality: Right;   ulnar nerve surgery Right 1986    OB History     Gravida  3   Para  2   Term      Preterm      AB  1   Living         SAB      IAB      Ectopic      Multiple      Live Births               Home Medications    Prior to Admission medications   Medication Sig Start Date End Date Taking? Authorizing Provider  sulfamethoxazole -trimethoprim  (BACTRIM  DS) 800-160 MG tablet Take 1 tablet by mouth 2 (two) times daily for 3 days. 04/29/24 05/02/24 Yes Kechia Yahnke E, PA-C  acetaminophen  (TYLENOL ) 500 MG tablet Take 500 mg by mouth every 6 (six) hours as needed for moderate pain or headache.     [provider]  aspirin  EC 81 MG tablet Take 81 mg by mouth daily after breakfast.     [provider]  atenolol  (TENORMIN ) 50 MG tablet Take 50 mg by mouth daily before breakfast.     [provider]  buPROPion  (WELLBUTRIN  XL) 150 MG 24 hr tablet Take 150 mg by mouth daily before breakfast.    [provider]  Docusate Calcium  (STOOL SOFTENER PO) Take by mouth as needed.    [provider]  fexofenadine  (ALLEGRA ) 180 MG tablet Take 1 tablet (180 mg total) by mouth daily. 08/21/21   Opalski, Bernardo Bridgeman, DO  fluticasone  (FLONASE ) 50 MCG/ACT nasal spray Place 2 sprays into both nostrils daily. 08/01/21   Jasmine Mesi D, MD  folic acid  (FOLVITE ) 1 MG tablet Take 1 mg by mouth daily after breakfast.     [provider]  Ibuprofen 200 MG CAPS Take by mouth daily as needed.    [provider]  Meth-Hyo-M Bl-Na Phos-Ph Sal (URO-MP PO) Take by mouth as needed.    [provider]  omeprazole  (PRILOSEC) 40 MG capsule Take 40 mg by mouth every morning.    [provider]   polyethylene glycol (MIRALAX ) 17 g packet Take 17 g by mouth daily. 04/30/22   Kommor, Madison, MD  traMADol  (ULTRAM ) 50 MG tablet Take 50 mg by mouth every 6 (six) hours as needed for moderate pain or severe pain.     [provider]  traZODone  (DESYREL ) 50 MG tablet Take  50 mg by mouth at bedtime.  04/09/19   [provider]    Family History Family History  Problem Relation Age of Onset   Hypertension Mother    Stroke Mother    Breast cancer Mother 63       died at 92   Diabetes Mother    Hyperlipidemia Mother    Heart disease Mother    Cancer Mother    Obesity Mother    Hypertension Father    Heart attack Father        died at 74   Sudden death Father    Hypertension Brother        MI   Prostate cancer Brother 34   Hypertension Brother        MI   Lung cancer Brother        possible lung cancer, spot found on lung being monitored   Hypertension Brother        MI   Non-Hodgkin's lymphoma Sister 77   Breast cancer Other 27       is currently 73   Breast cancer Other 43   Cancer Cousin 62       type of cancer unk.    Cancer Other        type unknown, eye/face    Cancer Other 65       died from cancer in his 92's, type unknown    Social History Social History   Tobacco Use   Smoking status: Never   Smokeless tobacco: Never  Vaping Use   Vaping status: Never Used  Substance Use Topics   Alcohol use: No   Drug use: No     Allergies   Gabapentin , Pyridium  [phenazopyridine  hcl], Atorvastatin , Duloxetine , Nsaids, and Oseltamivir   Review of Systems Review of Systems  Constitutional:  Negative for chills and fever.  Gastrointestinal:  Negative for abdominal pain.  Genitourinary:  Positive for dysuria and frequency. Negative for difficulty urinating, flank pain, hematuria, vaginal bleeding, vaginal discharge and vaginal pain.     Physical Exam Triage Vital Signs ED Triage Vitals [04/29/24 1539]  Encounter Vitals Group     BP (!) 164/90      Systolic BP Percentile      Diastolic BP Percentile      Pulse Rate 72     Resp 18     Temp 98.1 F (36.7 C)     Temp Source Oral     SpO2 95 %     Weight 220 lb (99.8 kg)     Height 5\' 2"  (1.575 m)     Head Circumference      Peak Flow      Pain Score      Pain Loc      Pain Education      Exclude from Growth Chart    No data found.  Updated Vital Signs BP (!) 164/90 (BP Location: Right Arm)   Pulse 72   Temp 98.1 F (36.7 C) (Oral)   Resp 18   Ht 5\' 2"  (1.575 m)   Wt 220 lb (99.8 kg)   SpO2 95%   BMI 40.24 kg/m   Visual Acuity Right Eye Distance:   Left Eye Distance:   Bilateral Distance:    Right Eye Near:   Left Eye Near:    Bilateral Near:     Physical Exam Vitals reviewed.  Constitutional:      General: She is awake.     Appearance: Normal  appearance. She is well-developed and well-groomed.  HENT:     Head: Normocephalic and atraumatic.  Eyes:     General: Lids are normal. Gaze aligned appropriately.     Extraocular Movements: Extraocular movements intact.     Conjunctiva/sclera: Conjunctivae normal.  Pulmonary:     Effort: Pulmonary effort is normal.  Neurological:     General: No focal deficit present.     Mental Status: She is alert and oriented to person, place, and time.     GCS: GCS eye subscore is 4. GCS verbal subscore is 5. GCS motor subscore is 6.     Cranial Nerves: No cranial nerve deficit, dysarthria or facial asymmetry.  Psychiatric:        Attention and Perception: Attention and perception normal.        Mood and Affect: Mood and affect normal.        Speech: Speech normal.        Behavior: Behavior normal. Behavior is cooperative.      Brown Treatments / Results  Labs (all labs ordered are listed, but only abnormal results are displayed) Labs Reviewed  POCT URINALYSIS DIP (MANUAL ENTRY) - Abnormal; Notable for the following components:      Result Value   Color, UA green (*)    Clarity, UA cloudy (*)    Blood, UA  trace-lysed (*)    Leukocytes, UA Moderate (2+) (*)    All other components within normal limits  URINE CULTURE    EKG   Radiology No results found.  Procedures Procedures (including critical care time)  Medications Ordered in Brown Medications - No data to display  Initial Impression / Assessment and Plan / Brown Course  I have reviewed the triage vital signs and the nursing notes.  Pertinent labs & imaging results that were available during my care of the patient were reviewed by me and considered in my medical decision making (see chart for details).      Final Clinical Impressions(s) / Brown Diagnoses   Final diagnoses:  Dysuria  Urinary tract infection with hematuria, site unspecified   Acute, new problem Patient reports symptoms comprised of the following: dysuria, pressure, increased urinary frequency for the past 2 days  Results of UA are consistent with UTI - urine sample sent for culture to determine causative organism and susceptibility- results to dictate further management  Recommend starting bactrim  PO BID x 3 days . Reviewed with patient that she should not use her URO-MP rx with Bactrim  as this can cause side effects. Also placed this info in AVS  Will provide script - discussed importance of finishing entire course of abx and staying well hydrated while recovering from UTI Reviewed ED precautions with patient Follow up as needed for persistent or worsening symptoms     Discharge Instructions      Based on your symptoms and results of the urinalysis I believe you have a UTI I recommend the following:  I have sent in a script for Bactrim  to be taken by mouth twice per day for 3 days.  Please finish the entire course of the antibiotic even if you are feeling better before it is completed unless you develop an allergic reaction or are told by a medical provider to stop taking it. Please limit your use of the URO-MP while on this antibiotic as this combination can  cause unwanted side effects.  We have sent a sample of your urine off for a urine culture.  This will  help us  determine what bacteria is causing your symptoms as well as the most appropriate antibiotic to treat it.  If we need to make any adjustments to your medication regimen and new medication will be sent to the pharmacy on file and you will be updated via phone call in MyChart. Stay well hydrated (at least 75 oz of water per day) and avoid holding your urine for prolonged periods of time. If you have any of the following please return to urgent care or go to the emergency room: Persistent symptoms, fever, trouble urinating or inability to urinate, confusion, flank pain.     ED Prescriptions     Medication Sig Dispense Auth. Provider   sulfamethoxazole -trimethoprim  (BACTRIM  DS) 800-160 MG tablet Take 1 tablet by mouth 2 (two) times daily for 3 days. 6 tablet Draden Cottingham E, PA-C      PDMP not reviewed this encounter.   Jerona Mooring, PA-C 04/29/24 1713

## 2024-04-29 NOTE — ED Triage Notes (Signed)
 Pt presents with complaints of urinary frequency and burning x 2 days. Pt currently rates her bladder pain a 4/10. Prescribed Uro-MP taken with some relief.

## 2024-04-30 ENCOUNTER — Telehealth: Payer: Self-pay

## 2024-04-30 MED ORDER — CIPROFLOXACIN HCL 250 MG PO TABS: 250.0000 mg | ORAL_TABLET | Freq: Two times a day (BID) | ORAL | 0 refills | Status: DC

## 2024-04-30 NOTE — Telephone Encounter (Signed)
 This RN returned patient's phone call at this time. Name and DOB verified. Pt was seen here yesterday on 6/4 with symptoms of UTI, prescribed Bactrim  for treatment. Pt called voicing concerns of diarrhea after taking three doses of the prescribed Bactrim . Would like this prescription changed to Ciprofloxacin  as this medication has worked in the past for her UTI's without side effects. Allergies reviewed. Erin Mecum, PA made aware. Routed to provider.

## 2024-04-30 NOTE — Telephone Encounter (Signed)
 Cipro  250 mg PO BID x 3 days sent to pharmacy on file per pt request

## 2024-05-01 ENCOUNTER — Ambulatory Visit (HOSPITAL_COMMUNITY): Payer: Self-pay

## 2024-05-01 ENCOUNTER — Encounter (HOSPITAL_BASED_OUTPATIENT_CLINIC_OR_DEPARTMENT_OTHER): Payer: Self-pay | Admitting: Emergency Medicine

## 2024-05-01 ENCOUNTER — Encounter: Payer: Self-pay | Admitting: Hematology and Oncology

## 2024-05-01 ENCOUNTER — Other Ambulatory Visit: Payer: Self-pay

## 2024-05-01 ENCOUNTER — Emergency Department (HOSPITAL_BASED_OUTPATIENT_CLINIC_OR_DEPARTMENT_OTHER)

## 2024-05-01 ENCOUNTER — Emergency Department (HOSPITAL_BASED_OUTPATIENT_CLINIC_OR_DEPARTMENT_OTHER)
Admission: EM | Admit: 2024-05-01 | Discharge: 2024-05-01 | Disposition: A | Discharge status: Home / Self Care | Attending: Emergency Medicine | Admitting: Emergency Medicine

## 2024-05-01 ENCOUNTER — Other Ambulatory Visit (HOSPITAL_BASED_OUTPATIENT_CLINIC_OR_DEPARTMENT_OTHER): Payer: Self-pay

## 2024-05-01 DIAGNOSIS — Z7982 Long term (current) use of aspirin: Secondary | ICD-10-CM | POA: Diagnosis not present

## 2024-05-01 DIAGNOSIS — K529 Noninfective gastroenteritis and colitis, unspecified: Secondary | ICD-10-CM | POA: Diagnosis not present

## 2024-05-01 DIAGNOSIS — K625 Hemorrhage of anus and rectum: Secondary | ICD-10-CM | POA: Insufficient documentation

## 2024-05-01 DIAGNOSIS — R109 Unspecified abdominal pain: Secondary | ICD-10-CM | POA: Diagnosis present

## 2024-05-01 LAB — BASIC METABOLIC PANEL WITH GFR
Anion gap: 11 (ref 5–15)
BUN: 10 mg/dL (ref 8–23)
CO2: 26 mmol/L (ref 22–32)
Calcium: 9.4 mg/dL (ref 8.9–10.3)
Chloride: 97 mmol/L — ABNORMAL LOW (ref 98–111)
Creatinine, Ser: 0.97 mg/dL (ref 0.44–1.00)
GFR, Estimated: 58 mL/min — ABNORMAL LOW (ref >60)
Glucose, Bld: 121 mg/dL — ABNORMAL HIGH (ref 70–99)
Potassium: 4 mmol/L (ref 3.5–5.1)
Sodium: 134 mmol/L — ABNORMAL LOW (ref 135–145)

## 2024-05-01 LAB — CBC WITH DIFFERENTIAL/PLATELET
Abs Immature Granulocytes: 0.03 10*3/uL (ref 0.00–0.07)
Basophils Absolute: 0 10*3/uL (ref 0.0–0.1)
Basophils Relative: 0 %
Eosinophils Absolute: 0.1 10*3/uL (ref 0.0–0.5)
Eosinophils Relative: 1 %
HCT: 37.5 % (ref 36.0–46.0)
Hemoglobin: 12.8 g/dL (ref 12.0–15.0)
Immature Granulocytes: 0 %
Lymphocytes Relative: 19 %
Lymphs Abs: 1.9 10*3/uL (ref 0.7–4.0)
MCH: 30.2 pg (ref 26.0–34.0)
MCHC: 34.1 g/dL (ref 30.0–36.0)
MCV: 88.4 fL (ref 80.0–100.0)
Monocytes Absolute: 1 10*3/uL (ref 0.1–1.0)
Monocytes Relative: 11 %
Neutro Abs: 6.7 10*3/uL (ref 1.7–7.7)
Neutrophils Relative %: 69 %
Platelets: 253 10*3/uL (ref 150–400)
RBC: 4.24 MIL/uL (ref 3.87–5.11)
RDW: 13.2 % (ref 11.5–15.5)
WBC: 9.7 10*3/uL (ref 4.0–10.5)
nRBC: 0 % (ref 0.0–0.2)

## 2024-05-01 LAB — URINE CULTURE: Culture: 40000 — AB

## 2024-05-01 MED ORDER — ONDANSETRON HCL 4 MG/2ML IJ SOLN
4.0000 mg | Freq: Once | INTRAMUSCULAR | Status: AC
  Administered 2024-05-01: 4 mg via INTRAVENOUS
  Filled 2024-05-01: qty 2

## 2024-05-01 MED ORDER — SODIUM CHLORIDE 0.9 % IV BOLUS
1000.0000 mL | Freq: Once | INTRAVENOUS | Status: AC
  Administered 2024-05-01: 1000 mL via INTRAVENOUS

## 2024-05-01 MED ORDER — CIPROFLOXACIN HCL 500 MG PO TABS
500.0000 mg | ORAL_TABLET | Freq: Once | ORAL | Status: AC
  Administered 2024-05-01: 500 mg via ORAL
  Filled 2024-05-01: qty 1

## 2024-05-01 MED ORDER — CIPROFLOXACIN HCL 500 MG PO TABS
500.0000 mg | ORAL_TABLET | Freq: Two times a day (BID) | ORAL | 0 refills | Status: AC
  Filled 2024-05-01: qty 14, 7d supply, fill #0

## 2024-05-01 MED ORDER — METRONIDAZOLE 500 MG PO TABS
500.0000 mg | ORAL_TABLET | Freq: Three times a day (TID) | ORAL | 0 refills | Status: AC
  Filled 2024-05-01: qty 21, 7d supply, fill #0

## 2024-05-01 MED ORDER — IOHEXOL 350 MG/ML SOLN
100.0000 mL | Freq: Once | INTRAVENOUS | Status: AC | PRN
  Administered 2024-05-01: 100 mL via INTRAVENOUS

## 2024-05-01 MED ORDER — ACETAMINOPHEN 325 MG PO TABS
650.0000 mg | ORAL_TABLET | Freq: Once | ORAL | Status: AC
  Administered 2024-05-01: 650 mg via ORAL
  Filled 2024-05-01: qty 2

## 2024-05-01 MED ORDER — METRONIDAZOLE 500 MG PO TABS
500.0000 mg | ORAL_TABLET | Freq: Once | ORAL | Status: AC
  Administered 2024-05-01: 500 mg via ORAL
  Filled 2024-05-01: qty 1

## 2024-05-01 NOTE — ED Triage Notes (Signed)
 Rectal bleeding after taking meds for UTI  (dx 6/4), rt sided ad pain   and had diarrhea states blood is bright red , no blood thinners

## 2024-05-01 NOTE — ED Provider Notes (Signed)
 Kiara Brown [facility] DEPARTMENT AT [facility] Provider Note   CSN: [id number] Arrival date & time: 05/01/24  4098     History  Chief Complaint  Patient presents with   Rectal Bleeding    Kiara Brown is a 83 y.o. female presenting to ED with complaint of cramping abdominal pain and blood in stool.  Patient reports she was diagnosed with a UTI 2 days ago and prescribed initially Bactrim .  She took 1 dose of Bactrim  and reports within 3 hours of that she began having severe cramping abdominal pain, and also had bad diarrhea for 2 days.  Her doctor switched her to ciprofloxacin  which she is currently taking for 3 days, and reports that her dysuria symptoms are improving, but she continues to have bright blood in her stool, and also when she is not having bowel movements.  She reports some nausea.  Reports an abdominal surgical history of hysterectomy, no other surgeries.  She is not on anticoagulation  HPI     Home Medications Prior to Admission medications   Medication Sig Start Date End Date Taking? Authorizing Provider  ciprofloxacin  (CIPRO ) 500 MG tablet Take 1 tablet (500 mg total) by mouth 2 (two) times daily for 7 days. 05/01/24 05/08/24 Yes Ankit Degregorio, Janalyn Me, MD  metroNIDAZOLE  (FLAGYL ) 500 MG tablet Take 1 tablet (500 mg total) by mouth 3 (three) times daily for 7 days. 05/01/24 05/08/24 Yes Dianne Bady, Janalyn Me, MD  acetaminophen  (TYLENOL ) 500 MG tablet Take 500 mg by mouth every 6 (six) hours as needed for moderate pain or headache.     [provider]  aspirin  EC 81 MG tablet Take 81 mg by mouth daily after breakfast.     [provider]  atenolol  (TENORMIN ) 50 MG tablet Take 50 mg by mouth daily before breakfast.     [provider]  buPROPion  (WELLBUTRIN  XL) 150 MG 24 hr tablet Take 150 mg by mouth daily before breakfast.    [provider]  Docusate Calcium  (STOOL SOFTENER PO) Take by mouth as needed.    [provider]   fexofenadine  (ALLEGRA ) 180 MG tablet Take 1 tablet (180 mg total) by mouth daily. 08/21/21   Marceil Sensor, DO  fluticasone  (FLONASE ) 50 MCG/ACT nasal spray Place 2 sprays into both nostrils daily. 08/01/21   Jasmine Mesi D, MD  folic acid  (FOLVITE ) 1 MG tablet Take 1 mg by mouth daily after breakfast.     [provider]  Ibuprofen 200 MG CAPS Take by mouth daily as needed.    [provider]  Meth-Hyo-M Bl-Na Phos-Ph Sal (URO-MP PO) Take by mouth as needed.    [provider]  omeprazole  (PRILOSEC) 40 MG capsule Take 40 mg by mouth every morning.    [provider]  polyethylene glycol (MIRALAX ) 17 g packet Take 17 g by mouth daily. 04/30/22   Kommor, Madison, MD  traMADol  (ULTRAM ) 50 MG tablet Take 50 mg by mouth every 6 (six) hours as needed for moderate pain or severe pain.     [provider]  traZODone  (DESYREL ) 50 MG tablet Take 50 mg by mouth at bedtime.  04/09/19   [provider]      Allergies    Gabapentin , Pyridium  [phenazopyridine  hcl], Atorvastatin , Duloxetine , Nsaids, and Oseltamivir    Review of Systems   Review of Systems  Physical Exam Updated Vital Signs BP (!) 169/76 (BP Location: Left Arm)   Pulse (!) 58   Temp 97.7 F (  36.5 C)   Resp 18   Ht 5\' 2"  (1.575 m)   Wt 99.8 kg   SpO2 96%   BMI 40.24 kg/m  Physical Exam Constitutional:      General: She is not in acute distress. HENT:     Head: Normocephalic and atraumatic.  Eyes:     Conjunctiva/sclera: Conjunctivae normal.     Pupils: Pupils are equal, round, and reactive to light.  Cardiovascular:     Rate and Rhythm: Normal rate and regular rhythm.  Pulmonary:     Effort: Pulmonary effort is normal. No respiratory distress.  Abdominal:     General: There is no distension.     Tenderness: There is abdominal tenderness in the right lower quadrant. There is no guarding.  Skin:    General: Skin is warm and dry.  Neurological:     General: No focal  deficit present.     Mental Status: She is alert. Mental status is at baseline.  Psychiatric:        Mood and Affect: Mood normal.        Behavior: Behavior normal.     ED Results / Procedures / Treatments   Labs (all labs ordered are listed, but only abnormal results are displayed) Labs Reviewed  BASIC METABOLIC PANEL WITH GFR - Abnormal; Notable for the following components:      Result Value   Sodium 134 (*)    Chloride 97 (*)    Glucose, Bld 121 (*)    GFR, Estimated 58 (*)    All other components within normal limits  CBC WITH DIFFERENTIAL/PLATELET    EKG None  Radiology CT ANGIO GI BLEED Result Date: 05/01/2024 CLINICAL DATA:  Rectal bleeding. Right-sided abdominal pain and diarrhea. EXAM: CTA ABDOMEN AND PELVIS WITHOUT AND WITH CONTRAST TECHNIQUE: Multidetector CT imaging of the abdomen and pelvis was performed using the standard protocol during bolus administration of intravenous contrast. Multiplanar reconstructed images and MIPs were obtained and reviewed to evaluate the vascular anatomy. RADIATION DOSE REDUCTION: This exam was performed according to the departmental dose-optimization program which includes automated exposure control, adjustment of the mA and/or kV according to patient size and/or use of iterative reconstruction technique. CONTRAST:  OMNIPAQUE  IOHEXOL  350 MG/ML SOLN COMPARISON:  April 30, 2022. FINDINGS: VASCULAR Aorta: Atherosclerosis of abdominal aorta is noted without aneurysm or dissection. Celiac: Severe narrowing is noted at origin without thrombus. SMA: Patent without evidence of aneurysm, dissection, vasculitis or significant stenosis. Renals: Both renal arteries are patent without evidence of aneurysm, dissection, vasculitis, fibromuscular dysplasia or significant stenosis. IMA: Patent without evidence of aneurysm, dissection, vasculitis or significant stenosis. Inflow: Patent without evidence of aneurysm, dissection, vasculitis or significant  stenosis. Proximal Outflow: Bilateral common femoral and visualized portions of the superficial and profunda femoral arteries are patent without evidence of aneurysm, dissection, vasculitis or significant stenosis. Veins: No obvious venous abnormality within the limitations of this arterial phase study. Review of the MIP images confirms the above findings. NON-VASCULAR Lower chest: No acute abnormality. Hepatobiliary: No focal liver abnormality is seen. Status post cholecystectomy. No biliary dilatation. Pancreas: Unremarkable. No pancreatic ductal dilatation or surrounding inflammatory changes. Spleen: Normal in size without focal abnormality. Adrenals/Urinary Tract: Adrenal glands appear normal. Bilateral renal cysts are noted. No hydronephrosis or renal obstruction is noted. Urinary bladder is unremarkable. Stomach/Bowel: The stomach is unremarkable. Status post appendectomy. There is no evidence of bowel obstruction. Sigmoid diverticulosis. Moderate wall thickening and surrounding inflammatory changes are seen involving the descending colon  suggesting infectious or inflammatory colitis. No definite contrast extravasation is seen to suggest gastrointestinal bleeding. Lymphatic: No adenopathy is noted. Reproductive: Status post hysterectomy. No adnexal masses. Other: No ascites or hernia is noted. Musculoskeletal: No acute or significant osseous findings. 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 No definite evidence of active gastrointestinal bleeding. Aortic Atherosclerosis (ICD10-I70.0). NON-VASCULAR Moderate wall thickening and inflammatory changes seen involving descending colon suggesting infectious or inflammatory colitis. Electronically Signed   By: Rosalene Colon M.D.   On: 05/01/2024 12:08    Procedures Procedures    Medications Ordered in ED Medications  iohexol  (OMNIPAQUE ) 350 MG/ML injection 100 mL (100 mLs Intravenous Contrast Given 05/01/24 1143)  sodium  chloride 0.9 % bolus 1,000 mL (0 mLs Intravenous Stopped 05/01/24 1415)  ciprofloxacin  (CIPRO ) tablet 500 mg (500 mg Oral Given 05/01/24 1313)  metroNIDAZOLE  (FLAGYL ) tablet 500 mg (500 mg Oral Given 05/01/24 1313)  ondansetron  (ZOFRAN ) injection 4 mg (4 mg Intravenous Given 05/01/24 1311)  acetaminophen  (TYLENOL ) tablet 650 mg (650 mg Oral Given 05/01/24 1313)    ED Course/ Medical Decision Making/ A&P Clinical Course as of 05/02/24 1610  Fri May 01, 2024  1042 Hemoglobin: 12.8 [MT]    Clinical Course User Index [MT] Arvilla Birmingham, MD                                 Medical Decision Making Amount and/or Complexity of Data Reviewed Labs: ordered. Decision-making details documented in ED Course. Radiology: ordered.  Risk OTC drugs. Prescription drug management.   This patient presents to the ED with concern for lower GI bleed suspected, abdominal pain. This involves an extensive number of treatment options, and is a complaint that carries with it a high risk of complications and morbidity.  The differential diagnosis includes medication side effect versus colitis versus diverticulitis versus other  External records from outside source obtained and reviewed including urgent care evaluation and physician's office note changing the patient to ciprofloxacin  250 mg BID x 3 days yesterday.  I ordered and personally interpreted labs.  The pertinent results include:  no emergent findings.  I ordered imaging studies including CT angio of the abdomen for GI bleed protocol I independently visualized and interpreted imaging which showed possible colitis/bowel inflammation I agree with the radiologist interpretation  I ordered medication including cipro  + flagyl  for colitis, zofran  and tylenol  for nausea and pain, IV fluids for hydration  I have reviewed the patients home medicines and have made adjustments as needed  After the interventions noted above, I reevaluated the patient and found that  they have: improved  Hospitalization was considered.  However, given that there patient was tolerating PO, had stable vitals and labs, no evidence of sepsis, no anemia, and wanted to go home, I thought home management was reasonable.  We'll switch her to cipro  500 mg BID and flagyl  for colitis.  Encouraged to drink fluids.  Abdominal pain should improve in next 1-3 days with antibiotics, including bleeding concerns.  If failing to improve, she knows to return to the ED.  Her family was present for this conversation and in agreement.  Dispostion:  After consideration of the diagnostic results and the patients response to treatment, I feel that the patent would benefit from close outpatient follow up.         Final Clinical Impression(s) / ED Diagnoses Final diagnoses:  Rectal bleeding  Colitis  Rx / DC Orders ED Discharge Orders          Ordered    ciprofloxacin  (CIPRO ) 500 MG tablet  2 times daily        05/01/24 1251    metroNIDAZOLE  (FLAGYL ) 500 MG tablet  3 times daily        05/01/24 1251              Arvilla Birmingham, MD 05/02/24 [phone]

## 2024-05-01 NOTE — ED Notes (Signed)
 Patient transported to CT

## 2024-05-01 NOTE — Discharge Instructions (Addendum)
 You will begin taking the 2 new antibiotics prescribed, and he will no longer take any of the old antibiotics.  These medications will treat for both urine infection as well as possible infection or inflammation of your bowels.  Do not drink alcohol with taking these medicines.

## 2024-05-13 ENCOUNTER — Other Ambulatory Visit: Payer: Medicare Other

## 2024-05-13 ENCOUNTER — Ambulatory Visit: Payer: Medicare Other | Admitting: Hematology and Oncology

## 2024-05-18 ENCOUNTER — Other Ambulatory Visit: Payer: Self-pay | Admitting: *Deleted

## 2024-05-18 DIAGNOSIS — D509 Iron deficiency anemia, unspecified: Secondary | ICD-10-CM

## 2024-05-18 DIAGNOSIS — C50211 Malignant neoplasm of upper-inner quadrant of right female breast: Secondary | ICD-10-CM

## 2024-05-19 ENCOUNTER — Inpatient Hospital Stay: Attending: Hematology and Oncology | Admitting: Hematology and Oncology

## 2024-05-19 ENCOUNTER — Other Ambulatory Visit: Payer: Self-pay | Admitting: *Deleted

## 2024-05-19 ENCOUNTER — Inpatient Hospital Stay

## 2024-05-19 ENCOUNTER — Other Ambulatory Visit: Payer: Self-pay

## 2024-05-19 VITALS — BP 131/60 | HR 54 | Temp 98.3°F | Resp 17 | Wt 226.1 lb

## 2024-05-19 DIAGNOSIS — C50211 Malignant neoplasm of upper-inner quadrant of right female breast: Secondary | ICD-10-CM | POA: Diagnosis not present

## 2024-05-19 DIAGNOSIS — D509 Iron deficiency anemia, unspecified: Secondary | ICD-10-CM

## 2024-05-19 DIAGNOSIS — Z170 Estrogen receptor positive status [ER+]: Secondary | ICD-10-CM | POA: Diagnosis not present

## 2024-05-19 DIAGNOSIS — Z1722 Progesterone receptor negative status: Secondary | ICD-10-CM | POA: Diagnosis not present

## 2024-05-19 DIAGNOSIS — Z79899 Other long term (current) drug therapy: Secondary | ICD-10-CM | POA: Insufficient documentation

## 2024-05-19 DIAGNOSIS — Z9011 Acquired absence of right breast and nipple: Secondary | ICD-10-CM | POA: Insufficient documentation

## 2024-05-19 DIAGNOSIS — E538 Deficiency of other specified B group vitamins: Secondary | ICD-10-CM

## 2024-05-19 DIAGNOSIS — Z1721 Progesterone receptor positive status: Secondary | ICD-10-CM | POA: Insufficient documentation

## 2024-05-19 DIAGNOSIS — Z79811 Long term (current) use of aromatase inhibitors: Secondary | ICD-10-CM | POA: Diagnosis not present

## 2024-05-19 LAB — CBC WITH DIFFERENTIAL (CANCER CENTER ONLY)
Abs Immature Granulocytes: 0.04 10*3/uL (ref 0.00–0.07)
Basophils Absolute: 0 10*3/uL (ref 0.0–0.1)
Basophils Relative: 1 %
Eosinophils Absolute: 0.1 10*3/uL (ref 0.0–0.5)
Eosinophils Relative: 2 %
HCT: 36.2 % (ref 36.0–46.0)
Hemoglobin: 12.4 g/dL (ref 12.0–15.0)
Immature Granulocytes: 1 %
Lymphocytes Relative: 24 %
Lymphs Abs: 2 10*3/uL (ref 0.7–4.0)
MCH: 29.9 pg (ref 26.0–34.0)
MCHC: 34.3 g/dL (ref 30.0–36.0)
MCV: 87.2 fL (ref 80.0–100.0)
Monocytes Absolute: 0.6 10*3/uL (ref 0.1–1.0)
Monocytes Relative: 8 %
Neutro Abs: 5.5 10*3/uL (ref 1.7–7.7)
Neutrophils Relative %: 64 %
Platelet Count: 301 10*3/uL (ref 150–400)
RBC: 4.15 MIL/uL (ref 3.87–5.11)
RDW: 13.1 % (ref 11.5–15.5)
WBC Count: 8.3 10*3/uL (ref 4.0–10.5)
nRBC: 0 % (ref 0.0–0.2)

## 2024-05-19 LAB — IRON AND IRON BINDING CAPACITY (CC-WL,HP ONLY)
Iron: 50 ug/dL (ref 28–170)
Saturation Ratios: 16 % (ref 10.4–31.8)
TIBC: 316 ug/dL (ref 250–450)
UIBC: 266 ug/dL (ref 148–442)

## 2024-05-19 LAB — CMP (CANCER CENTER ONLY)
ALT: 9 U/L (ref 0–44)
AST: 14 U/L — ABNORMAL LOW (ref 15–41)
Albumin: 4.1 g/dL (ref 3.5–5.0)
Alkaline Phosphatase: 67 U/L (ref 38–126)
Anion gap: 6 (ref 5–15)
BUN: 15 mg/dL (ref 8–23)
CO2: 27 mmol/L (ref 22–32)
Calcium: 9.5 mg/dL (ref 8.9–10.3)
Chloride: 95 mmol/L — ABNORMAL LOW (ref 98–111)
Creatinine: 0.89 mg/dL (ref 0.44–1.00)
GFR, Estimated: >60 mL/min (ref >60)
Glucose, Bld: 143 mg/dL — ABNORMAL HIGH (ref 70–99)
Potassium: 4.3 mmol/L (ref 3.5–5.1)
Sodium: 128 mmol/L — ABNORMAL LOW (ref 135–145)
Total Bilirubin: 0.4 mg/dL (ref 0.0–1.2)
Total Protein: 7.2 g/dL (ref 6.5–8.1)

## 2024-05-19 LAB — VITAMIN B12: Vitamin B-12: 336 pg/mL (ref 180–914)

## 2024-05-19 LAB — FERRITIN: Ferritin: 59 ng/mL (ref 11–307)

## 2024-05-19 NOTE — Progress Notes (Signed)
 [facility]  [phone]) [phone] Fax:([phone]) [phone]     ID: Kiara Brown DOB: 23-Jun-1941  MR#: [id number]  RDW#:[id number]  Patient Care Team: Kiara Darnelle BRAVO, MD as PCP - General (Internal Medicine) Kiara Katz, MD (Inactive) as Consulting Physician (General Surgery) Kiara Agent, MD as Consulting Physician (Radiation Oncology) Kiara Lamar CHRISTELLA, MD as Referring Physician (Internal Medicine) Bensimhon, Kiara SAUNDERS, MD as Consulting Physician (Cardiology) Steen Kiara Brown., MD as Consulting Physician (Urology) Brown, Kiara Von Mealing, MD as Consulting Physician (Pain Medicine) Kiara Ash, MD as Consulting Physician (Hematology and Oncology)  CHIEF COMPLAINT: 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  INTERVAL HISTORY:  History of Present Illness    Discussed the use of AI scribe software for clinical note transcription with the patient, who gave verbal consent to proceed.  History of Present Illness Kiara Brown is an 83 year old female who presents for follow-up after breast cancer treatment.  She was diagnosed with breast cancer in 2018 and underwent a right mastectomy. Initially, she received Herceptin  but experienced poor tolerance. She was then on lapatinib  briefly and subsequently anastrozole  for five years, which she has now discontinued. She reports occasional chest pain on the side of her mastectomy.  She has a history of iron  deficiency anemia and received venofer . Her current hemoglobin level is 12.4, indicating improvement.  She recently had a urinary tract infection for which she completed a course of antibiotics two weeks ago. No hospitalization was required, and she reports no current symptoms.  She experiences chronic back pain that limits her mobility. She underwent a back ablation a few months ago, which provided partial relief. She takes B complex vitamins  and 2000 units of vitamin D  daily.  Rest of the pertinent 10 point ROS reviewed and neg.    COVID 19 VACCINATION STATUS: Status post Pfizer x2+ booster September 2021 MI: Also had COVID August 2022   BREAST CANCER HISTORY: From the original intake note:  Kiara Brown herself noted a change around her nipple on the right and brought it to medical attention. She was set up for bilateral diagnostic mammography with tomography and right breast ultrasonography at [facility] 05/23/2017. The breast density was category C. In the right breast upper inner quadrant there was a 2.5 cm area of grouped pleomorphic calcifications correlating with the palpable mass. Also there was a 1.2 cm macrolobulated mass in the upper inner quadrant also in the palpable region. These masses were identifiable by ultrasound with the same dimensions. The right axilla was sonographically negative.  On 05/27/2017 the patient underwent biopsy of the 2 masses in question. 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  The patient's subsequent history is as detailed below.   PAST MEDICAL HISTORY: Past Medical History:  Diagnosis Date   Anemia    Arthritis    osteo, rheumatoid   Back pain    Breast cancer (HCC)    Cancer (HCC)    Depression 02/22/2020   Diabetes (HCC)    Diverticulosis    Dysrhythmia    hx rapid heartbeat, no cardiologist  Family history of breast cancer    Family history of prostate cancer    Fibromyalgia    peripheral neuropathy   Fibromyalgia    Fibromyalgia    Gallbladder problem    GERD  (gastroesophageal reflux disease)    H/O hiatal hernia    Hyperlipidemia    Joint pain    Lower extremity edema    Obesity    Osteoarthritis    PONV (postoperative nausea and vomiting)    THROAT WITH EXTREME [id number], ulnar nerve surgery   Rheumatoid arthritis (HCC)    RLS (restless legs syndrome)    Sciatica    Shortness of breath dyspnea    with exertion   Slipped disc in neck    UTI (lower urinary tract infection)     PAST SURGICAL HISTORY: Past Surgical History:  Procedure Laterality Date   ABDOMINAL HYSTERECTOMY     APPENDECTOMY     back injection      July 2018- x1   BREAST SURGERY Left 1986   biopsy, Recent 05/2017 at [facility]- had biopsy on R breast- malignant    CARDIAC CATHETERIZATION  2009   no blockage per pt   CHOLECYSTECTOMY     CYSTOSCOPY WITH BIOPSY     INCONTINENCE SURGERY     IRRIGATION AND DEBRIDEMENT ABSCESS Right 01/28/2018   Procedure: IRRIGATION AND DEBRIDEMENT AXILLARY ABSCESS;  Surgeon: Kiara Katz, MD;  Location: [facility];  Service: General;  Laterality: Right;   JOINT REPLACEMENT     KNEE ARTHROSCOPY     bilateral   KNEE ARTHROSCOPY Left 03/17/2014   Procedure: ARTHROSCOPY LEFT KNEE WITH SYNOVECTOMY;  Surgeon: Kiara LULLA Moan, MD;  Location: [facility];  Service: Orthopedics;  Laterality: Left;   PORTACATH PLACEMENT Left 07/02/2017   Procedure: INSERTION PORT-A-CATH;  Surgeon: Kiara Katz, MD;  Location: [facility];  Service: General;  Laterality: Left;   RIGHT/LEFT HEART CATH AND CORONARY ANGIOGRAPHY N/A 08/30/2017   Procedure: RIGHT/LEFT HEART CATH AND CORONARY ANGIOGRAPHY;  Surgeon: Kiara Ezra RAMAN, MD;  Location: [facility] INVASIVE CV LAB;  Service: Cardiovascular;  Laterality: N/A;   ROTATOR CUFF REPAIR     right   SIMPLE MASTECTOMY WITH AXILLARY SENTINEL NODE BIOPSY Right 07/02/2017   Procedure: RIGHT TOTAL  MASTECTOMY WITH AXILLARY SENTINEL LYMPH  NODE BIOPSY;  Surgeon: Kiara Katz, MD;  Location: [facility];  Service: General;  Laterality:  Right;   TOTAL KNEE ARTHROPLASTY  08/04/2012   Procedure: TOTAL KNEE ARTHROPLASTY;  Surgeon: Kiara LULLA Moan, MD;  Location: [facility];  Service: Orthopedics;  Laterality: Left;   TOTAL KNEE ARTHROPLASTY Right 04/11/2015   Procedure: RIGHT TOTAL KNEE ARTHROPLASTY;  Surgeon: Kiara Moan, MD;  Location: [facility];  Service: Orthopedics;  Laterality: Right;   ulnar nerve surgery Right 1986    FAMILY HISTORY Family History  Problem Relation Age of Onset   Hypertension Mother    Stroke Mother    Breast cancer Mother 38       died at 41   Diabetes Mother    Hyperlipidemia Mother    Heart disease Mother    Cancer Mother    Obesity Mother    Hypertension Father    Heart attack Father        died at 45   Sudden death Father    Hypertension Brother        MI   Prostate cancer Brother 85   Hypertension Brother        MI   Lung cancer Brother  possible lung cancer, spot found on lung being monitored   Hypertension Brother        MI   Non-Hodgkin's lymphoma Sister 51   Breast cancer Other 43       is currently 58   Breast cancer Other 55   Cancer Cousin 62       type of cancer unk.    Cancer Other        type unknown, eye/face    Cancer Other 65       died from cancer in his 56's, type unknown  The patient's father died from a heart attack at age 57. The patient's mother was diagnosed with breast cancer at age 94 and died at age 10. The patient had 5 brothers, 3 sisters. One sister had breast cancer at age 21. One brother was diagnosed with prostate cancer at age 63. One Brother died from lung cancer at age 30. One sister had non-Hodgkin's lymphoma diagnosed at age 48.    GYNECOLOGIC HISTORY:  No LMP recorded. Patient has had a hysterectomy.  menarche age 80, first live birth age 66. All 3 of her births were premature including 1 set of twins. One of the twins died a few hours after birth. She underwent hysterectomy in 1981 with unilateral salpingo-oophorectomy. 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  She has led groups as far away as [place]  and [place] and has led many in the [place] cruises as well.  Her husband, Elna began a business installing fences which is now carried on by his son Norman. 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 Son, Caron is a truck Hospital doctor in [employer]. The patient has one grandchild. She is a International aid/development worker.     ADVANCED DIRECTIVES: In place. The patient's husband is her healthcare power of attorney   HEALTH MAINTENANCE: Social History   Tobacco Use   Smoking status: Never   Smokeless tobacco: Never  Vaping Use   Vaping status: Never Used  Substance Use Topics   Alcohol use: No   Drug use: No     Colonoscopy:2017 / [place]   PAP:  Bone density: 2019/ [facility] normal   Allergies  Allergen Reactions   Gabapentin  Anxiety   Pyridium  [Phenazopyridine  Hcl]     Nausea  Vomiting diarrhea   Atorvastatin  Other (See Comments)    myalgias    Duloxetine  Other (See Comments)    UNSPECIFIED REACTION    Nsaids Rash   Oseltamivir Diarrhea and Other (See Comments)    Abdominal pain     Current Outpatient Medications  Medication Sig Dispense Refill   acetaminophen  (TYLENOL ) 500 MG tablet Take 500 mg by mouth every 6 (six) hours as needed for moderate pain or headache.      aspirin  EC 81 MG tablet Take 81 mg by mouth daily after breakfast.      atenolol  (TENORMIN ) 50 MG tablet Take 50 mg by mouth daily before breakfast.      buPROPion  (WELLBUTRIN  XL) 150 MG 24 hr tablet Take 150 mg by mouth daily before breakfast.     Docusate Calcium  (STOOL SOFTENER PO) Take by mouth as needed.     fexofenadine  (ALLEGRA ) 180 MG tablet Take 1 tablet (180 mg total) by mouth daily. 30 tablet 0   folic acid  (FOLVITE ) 1 MG tablet Take 1 mg by mouth daily after breakfast.  Meth-Hyo-M Bl-Na Phos-Ph Sal (URO-MP PO) Take by mouth as  needed.     omeprazole  (PRILOSEC) 40 MG capsule Take 40 mg by mouth every morning.     polyethylene glycol (MIRALAX ) 17 g packet Take 17 g by mouth daily. 14 each 0   traMADol  (ULTRAM ) 50 MG tablet Take 50 mg by mouth every 6 (six) hours as needed for moderate pain or severe pain.      traZODone  (DESYREL ) 50 MG tablet Take 50 mg by mouth at bedtime.      No current facility-administered medications for this visit.    OBJECTIVE: White woman who appears stated age  Vitals:   05/19/24 1140 05/19/24 1141  BP: (!) 151/63 131/60  Pulse: (!) 54   Resp: 17   Temp: 98.3 F (36.8 C)   SpO2: 99%        Body mass index is 41.35 kg/m.   Wt Readings from Last 3 Encounters:  05/19/24 226 lb 1.6 oz (102.6 kg)  05/01/24 220 lb 0.3 oz (99.8 kg)  04/29/24 220 lb (99.8 kg)   Sclerae unicteric, EOMs intact Neck: No palpable adenopathy. Chest: CTA Heart: RRR Lower extremities: No lower extremity edema Breast: No concern for breast cancer recurrence. No palpable masses or regional adenopathy  LAB RESULTS:  CMP     Component Value Date/Time   NA 134 (L) 05/01/2024 1028   NA 131 (L) 09/06/2017 1018   K 4.0 05/01/2024 1028   K 4.2 09/06/2017 1018   CL 97 (L) 05/01/2024 1028   CO2 26 05/01/2024 1028   CO2 25 09/06/2017 1018   GLUCOSE 121 (H) 05/01/2024 1028   GLUCOSE 187 (H) 09/06/2017 1018   BUN 10 05/01/2024 1028   BUN 11.2 09/06/2017 1018   CREATININE 0.97 05/01/2024 1028   CREATININE 1.04 (H) 11/13/2023 1343   CREATININE 1.2 (H) 09/06/2017 1018   CALCIUM  9.4 05/01/2024 1028   CALCIUM  9.3 09/06/2017 1018   PROT 6.8 11/13/2023 1343   PROT 6.9 09/06/2017 1018   ALBUMIN  3.8 11/13/2023 1343   ALBUMIN  3.4 (L) 09/06/2017 1018   AST 14 (L) 11/13/2023 1343   AST 20 09/06/2017 1018   ALT 11 11/13/2023 1343   ALT 14 09/06/2017 1018   ALKPHOS 85 11/13/2023 1343   ALKPHOS 103 09/06/2017 1018   BILITOT 0.5 11/13/2023 1343   BILITOT 0.57 09/06/2017 1018   GFRNONAA 58 (L) 05/01/2024 1028    GFRNONAA 54 (L) 11/13/2023 1343   GFRAA >60 02/24/2020 0536   GFRAA 59 (L) 02/21/2018 1139    No results found for: TOTALPROTELP, ALBUMINELP, A1GS, A2GS, BETS, BETA2SER, GAMS, MSPIKE, SPEI  No results found for: JONATHAN BONG, [facility]  Lab Results  Component Value Date   WBC 8.3 05/19/2024   NEUTROABS 5.5 05/19/2024   HGB 12.4 05/19/2024   HCT 36.2 05/19/2024   MCV 87.2 05/19/2024   PLT 301 05/19/2024      Chemistry      Component Value Date/Time   NA 134 (L) 05/01/2024 1028   NA 131 (L) 09/06/2017 1018   K 4.0 05/01/2024 1028   K 4.2 09/06/2017 1018   CL 97 (L) 05/01/2024 1028   CO2 26 05/01/2024 1028   CO2 25 09/06/2017 1018   BUN 10 05/01/2024 1028   BUN 11.2 09/06/2017 1018   CREATININE 0.97 05/01/2024 1028   CREATININE 1.04 (H) 11/13/2023 1343   CREATININE 1.2 (H) 09/06/2017 1018      Component Value Date/Time   CALCIUM  9.4 05/01/2024  1028   CALCIUM  9.3 09/06/2017 1018   ALKPHOS 85 11/13/2023 1343   ALKPHOS 103 09/06/2017 1018   AST 14 (L) 11/13/2023 1343   AST 20 09/06/2017 1018   ALT 11 11/13/2023 1343   ALT 14 09/06/2017 1018   BILITOT 0.5 11/13/2023 1343   BILITOT 0.57 09/06/2017 1018       No results found for: LABCA2  No components found for: [id number]  No results for input(s): INR in the last 168 hours.  Urinalysis    Component Value Date/Time   COLORURINE YELLOW 04/30/2022 1027   APPEARANCEUR CLEAR 04/30/2022 1027   LABSPEC 1.015 04/30/2022 1027   PHURINE 7.0 04/30/2022 1027   GLUCOSEU NEGATIVE 04/30/2022 1027   HGBUR TRACE (A) 04/30/2022 1027   BILIRUBINUR negative 04/29/2024 1514   KETONESUR negative 04/29/2024 1514   KETONESUR NEGATIVE 04/30/2022 1027   PROTEINUR negative 04/29/2024 1514   PROTEINUR NEGATIVE 04/30/2022 1027   UROBILINOGEN 0.2 04/29/2024 1514   UROBILINOGEN 0.2 04/06/2015 1332   NITRITE Negative 04/29/2024 1514   NITRITE NEGATIVE 04/30/2022 1027   LEUKOCYTESUR  Moderate (2+) (A) 04/29/2024 1514   LEUKOCYTESUR TRACE (A) 04/30/2022 1027    STUDIES: CT ANGIO GI BLEED Result Date: 05/01/2024 CLINICAL DATA:  Rectal bleeding. Right-sided abdominal pain and diarrhea. EXAM: CTA ABDOMEN AND PELVIS WITHOUT AND WITH CONTRAST TECHNIQUE: Multidetector CT imaging of the abdomen and pelvis was performed using the standard protocol during bolus administration of intravenous contrast. Multiplanar reconstructed images and MIPs were obtained and reviewed to evaluate the vascular anatomy. RADIATION DOSE REDUCTION: This exam was performed according to the departmental dose-optimization program which includes automated exposure control, adjustment of the mA and/or kV according to patient size and/or use of iterative reconstruction technique. CONTRAST:  OMNIPAQUE  IOHEXOL  350 MG/ML SOLN COMPARISON:  April 30, 2022. FINDINGS: VASCULAR Aorta: Atherosclerosis of abdominal aorta is noted without aneurysm or dissection. Celiac: Severe narrowing is noted at origin without thrombus. SMA: Patent without evidence of aneurysm, dissection, vasculitis or significant stenosis. Renals: Both renal arteries are patent without evidence of aneurysm, dissection, vasculitis, fibromuscular dysplasia or significant stenosis. IMA: Patent without evidence of aneurysm, dissection, vasculitis or significant stenosis. Inflow: Patent without evidence of aneurysm, dissection, vasculitis or significant stenosis. Proximal Outflow: Bilateral common femoral and visualized portions of the superficial and profunda femoral arteries are patent without evidence of aneurysm, dissection, vasculitis or significant stenosis. Veins: No obvious venous abnormality within the limitations of this arterial phase study. Review of the MIP images confirms the above findings. NON-VASCULAR Lower chest: No acute abnormality. Hepatobiliary: No focal liver abnormality is seen. Status post cholecystectomy. No biliary dilatation. Pancreas:  Unremarkable. No pancreatic ductal dilatation or surrounding inflammatory changes. Spleen: Normal in size without focal abnormality. Adrenals/Urinary Tract: Adrenal glands appear normal. Bilateral renal cysts are noted. No hydronephrosis or renal obstruction is noted. Urinary bladder is unremarkable. Stomach/Bowel: The stomach is unremarkable. Status post appendectomy. There is no evidence of bowel obstruction. Sigmoid diverticulosis. Moderate wall thickening and surrounding inflammatory changes are seen involving the descending colon suggesting infectious or inflammatory colitis. No definite contrast extravasation is seen to suggest gastrointestinal bleeding. Lymphatic: No adenopathy is noted. Reproductive: Status post hysterectomy. No adnexal masses. Other: No ascites or hernia is noted. Musculoskeletal: No acute or significant osseous findings. 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 No definite evidence of active gastrointestinal bleeding. Aortic Atherosclerosis (ICD10-I70.0). NON-VASCULAR Moderate wall thickening and inflammatory changes seen involving descending colon suggesting infectious or inflammatory colitis. Electronically Signed  By: Lynwood Landy Raddle M.D.   On: 05/01/2024 12:08     ELIGIBLE FOR AVAILABLE RESEARCH PROTOCOL: No  ASSESSMENT: 83 y.o. [place] woman status post right breast upper inner quadrant biopsy 2 on 05/27/2017 showing a clinical T1c N0, stage IA invasive ductal carcinoma, grade 3, estrogen and progesterone receptor positive, HER-2 amplified, with an MIB-1 of 60%  (1) genetics testing 07/14/2017 through the Multi-Cancer panel + Breast Cancer Panel found no deleterious mutations in  ALK, APC,AKT1, ATM, AXIN2,BAP1,  BARD1, BLM, BMPR1A, BRCA1, BRCA2, BRIP1, CASR, CDC73, CDH1, CDK4, CDKN1B, CDKN1C, CDKN2A (p14ARF), CDKN2A (p16INK4a), CEBPA, CHEK2, CTNNA1, DICER1, DIS3L2, EGFR (c.2369C>T, p.Thr790Met variant only), EPCAM (Deletion/duplication  testing only), FH, FAM175A, FLCN, GATA2, GPC3, GREM1 (Promoter region deletion/duplication testing only), HOXB13 (c.251G>A, p.Gly84Glu), HRAS, KIT, MAX, MEN1, MET, MITF (c.952G>A, p.Glu318Lys variant only), MLH1, MSH2, MSH3, MSH6, MUTYH, NBN, NF1, NF2, NTHL1, PALB2, PDGFRA, PHOX2B, PMS2, POLD1, POLE, POT1, PRKAR1A, PTCH1, PTEN, RAD50, RAD51C, RAD51D, RB1, RECQL4, RET, RUNX1, SDHAF2, SDHA (sequence changes only), SDHB, SDHC, SDHD, SMAD4, SMARCA4, SMARCB1, SMARCE1, STK11, SUFU, TERC, TERT, TMEM127, TP53, TSC1, TSC2, VHL, WRN, WT1, FANCC, MRE11, PIK3CA, RINT1, XRCC2.  (a)  3 Variants of Uncertain significance (VUS) were identified.  BRCA1 c.2551G>A (p.Glu851Lys)----CHEK2 c.679G>A (p.Gly227Arg).  The CHECK 2 variant has been reported as possibly mosaic--- SDHA c.704T>C (p.Ile235Thr).  (2) anastrozole  started 06/05/2017, to be continued and minimum of 5 years   (a) bone density 06/03/2018 showed a T score of -0.5 (normal).  (3) status post right mastectomy and sentinel lymph node sampling 07/02/2017 for an mpT2 pN0, stage IB invasive ductal carcinoma, grade 3, with negative margins.  (a) the patient opted against reconstruction  (4) planned trastuzumab  for 6 months started 08/09/2017, discontinued after one dose with significant reaction  (a) baseline echocardiogram 06/19/2017 shows an excellent ejection fraction  (b) Echocardiogram 09/16/2017 showed an ejection fraction of 60-65%  (5) started lapatinib  12/02/2017-- continued 6 months (completed July 2019)  (a) echocardiogram 01/21/2018 shows an ejection fraction in the 60-65%.  (b) echocardiogram 06/02/2018 showed an ejection fraction in the 55-60% range   PLAN:  Assessment and Plan Assessment & Plan Breast cancer Diagnosed in 2018, treated with mastectomy and medications.  - No concern for recurrence on ROS/PE - Schedule mammogram for September 2025. - RTC in 1 yr or sooner as needed.  Back pain Chronic pain persists despite ablation,  affecting mobility. - Consider use of a cane for balance.  IDA, Hb normal today Ferritin pending  B12 deficiency, on multivitamins. B12 level normal today  Time spent: 30 min  *Total Encounter Time as defined by the Centers for Medicare and Medicaid Services includes, in addition to the face-to-face time of a patient visit (documented in the note above) non-face-to-face time: obtaining and reviewing outside history, ordering and reviewing medications, tests or procedures, care coordination (communications with other health care professionals or caregivers) and documentation in the medical record.

## 2024-05-20 ENCOUNTER — Ambulatory Visit: Payer: Self-pay | Admitting: Hematology and Oncology

## 2024-07-02 ENCOUNTER — Telehealth: Payer: Self-pay | Admitting: Urology

## 2024-07-02 NOTE — Telephone Encounter (Signed)
 Patient has an appointment on 07/16/2024. Has a UTI right now. Finished her 10 days of antibiotics and then 3 days later has an infection again. Wants to know what she should do.

## 2024-07-16 ENCOUNTER — Ambulatory Visit: Admitting: Urology

## 2024-07-16 ENCOUNTER — Encounter: Payer: Self-pay | Admitting: Urology

## 2024-07-16 VITALS — BP 189/73 | HR 62 | Ht 63.0 in | Wt 220.0 lb

## 2024-07-16 DIAGNOSIS — Z87440 Personal history of urinary (tract) infections: Secondary | ICD-10-CM | POA: Diagnosis not present

## 2024-07-16 DIAGNOSIS — Z09 Encounter for follow-up examination after completed treatment for conditions other than malignant neoplasm: Secondary | ICD-10-CM | POA: Diagnosis not present

## 2024-07-16 DIAGNOSIS — N390 Urinary tract infection, site not specified: Secondary | ICD-10-CM

## 2024-07-16 LAB — URINALYSIS, ROUTINE W REFLEX MICROSCOPIC
Bilirubin, UA: NEGATIVE
Glucose, UA: NEGATIVE
Ketones, UA: NEGATIVE
Nitrite, UA: NEGATIVE
Protein,UA: NEGATIVE
Specific Gravity, UA: 1.015 (ref 1.005–1.030)
Urobilinogen, Ur: 0.2 mg/dL (ref 0.2–1.0)
pH, UA: 5.5 (ref 5.0–7.5)

## 2024-07-16 LAB — MICROSCOPIC EXAMINATION: Casts: POSITIVE /LPF — AB

## 2024-07-16 LAB — BLADDER SCAN AMB NON-IMAGING

## 2024-07-16 MED ORDER — METHENAMINE HIPPURATE 1 G PO TABS: 1.0000 g | ORAL_TABLET | Freq: Two times a day (BID) | ORAL | 5 refills | Status: AC

## 2024-07-16 NOTE — Progress Notes (Signed)
 Assessment: 1. Frequent UTI     Plan: I personally reviewed the patient's chart including provider notes, lab results. I reviewed records from [facility]. Methods to reduce the risk of UTIs discussed with the patient including timed and double voiding, increase fluid intake, daily cranberry supplement, and daily probiotic. Given her history of breast cancer, we will avoid use of vaginal hormone cream. I discussed management of recurrent UTIs with daily antibiotic prophylaxis.  She would like to avoid this if possible. Recommend methenamine  1 g twice daily. Timed and double voiding to ensure bladder emptying. Return to office in 6 weeks.  Chief Complaint:  Chief Complaint  Patient presents with   Frequent UTI    History of Present Illness:  Kiara Brown is a 83 y.o. female who is seen in consultation from Otho Darnelle BRAVO, MD for evaluation of history of UTIs. She was previously seen by Dr. Steen with [facility] for evaluation of recurrent UTIs.  She was managed with self start Macrobid.  She was last seen in June 2025. She is status post a retropubic mid urethral sling for management of stress urinary incontinence.  She had vaginal extrusion of the sling resulting in occasional vaginal spotting.  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  She has been managed with Arimidex . Typical UTI symptoms include dysuria, frequency, urgency, and low back pain.  CT abdomen pelvis from 6/23 showed no renal or ureteral calculi, no evidence of obstruction, bilateral renal cyst.  Urine culture results: 2/25 >100 K E. Coli 4/25 >100 K E. Coli 7/25 60-100 K E. Coli  She recently completed a course of Macrobid for treatment of a UTI.  She is not having any UTI symptoms today.  She continues on a probiotic.  No dysuria or gross hematuria.  Past Medical  History:  Past Medical History:  Diagnosis Date   Anemia    Arthritis    osteo, rheumatoid   Back pain    Breast cancer (HCC)    Cancer (HCC)    Depression 02/22/2020   Diabetes (HCC)    Diverticulosis    Dysrhythmia    hx rapid heartbeat, no cardiologist   Family history of breast cancer    Family history of prostate cancer    Fibromyalgia    peripheral neuropathy   Fibromyalgia    Fibromyalgia    Gallbladder problem    GERD (gastroesophageal reflux disease)    H/O hiatal hernia    Hyperlipidemia    Joint pain    Lower extremity edema    Obesity    Osteoarthritis    PONV (postoperative nausea and vomiting)    THROAT WITH EXTREME [id number], ulnar nerve surgery   Rheumatoid arthritis (HCC)    RLS (restless legs syndrome)    Sciatica    Shortness of breath dyspnea    with exertion   Slipped disc in neck    UTI (lower urinary tract infection)     Past Surgical History:  Past Surgical History:  Procedure Laterality Date   ABDOMINAL HYSTERECTOMY     APPENDECTOMY     back injection      July 2018- x1   BREAST SURGERY Left 1986   biopsy, Recent 05/2017 at [facility]- had biopsy on R breast- malignant    CARDIAC CATHETERIZATION  2009   no blockage per pt   CHOLECYSTECTOMY     CYSTOSCOPY WITH  BIOPSY     INCONTINENCE SURGERY     IRRIGATION AND DEBRIDEMENT ABSCESS Right 01/28/2018   Procedure: IRRIGATION AND DEBRIDEMENT AXILLARY ABSCESS;  Surgeon: Mikell Katz, MD;  Location: [facility];  Service: General;  Laterality: Right;   JOINT REPLACEMENT     KNEE ARTHROSCOPY     bilateral   KNEE ARTHROSCOPY Left 03/17/2014   Procedure: ARTHROSCOPY LEFT KNEE WITH SYNOVECTOMY;  Surgeon: Dempsey LULLA Moan, MD;  Location: [facility];  Service: Orthopedics;  Laterality: Left;   PORTACATH PLACEMENT Left 07/02/2017   Procedure: INSERTION PORT-A-CATH;  Surgeon: Mikell Katz, MD;  Location: [facility];  Service: General;  Laterality: Left;   RIGHT/LEFT HEART CATH AND CORONARY ANGIOGRAPHY N/A  08/30/2017   Procedure: RIGHT/LEFT HEART CATH AND CORONARY ANGIOGRAPHY;  Surgeon: Rolan Ezra RAMAN, MD;  Location: [facility] INVASIVE CV LAB;  Service: Cardiovascular;  Laterality: N/A;   ROTATOR CUFF REPAIR     right   SIMPLE MASTECTOMY WITH AXILLARY SENTINEL NODE BIOPSY Right 07/02/2017   Procedure: RIGHT TOTAL  MASTECTOMY WITH AXILLARY SENTINEL LYMPH  NODE BIOPSY;  Surgeon: Mikell Katz, MD;  Location: [facility];  Service: General;  Laterality: Right;   TOTAL KNEE ARTHROPLASTY  08/04/2012   Procedure: TOTAL KNEE ARTHROPLASTY;  Surgeon: Dempsey LULLA Moan, MD;  Location: [facility];  Service: Orthopedics;  Laterality: Left;   TOTAL KNEE ARTHROPLASTY Right 04/11/2015   Procedure: RIGHT TOTAL KNEE ARTHROPLASTY;  Surgeon: Dempsey Moan, MD;  Location: [facility];  Service: Orthopedics;  Laterality: Right;   ulnar nerve surgery Right 1986    Allergies:  Allergies  Allergen Reactions   Gabapentin  Anxiety   Pyridium  [Phenazopyridine  Hcl]     Nausea  Vomiting diarrhea   Atorvastatin  Other (See Comments)    myalgias    Duloxetine  Other (See Comments)    UNSPECIFIED REACTION    Nsaids Rash   Oseltamivir Diarrhea and Other (See Comments)    Abdominal pain     Family History:  Family History  Problem Relation Age of Onset   Hypertension Mother    Stroke Mother    Breast cancer Mother 33       died at 57   Diabetes Mother    Hyperlipidemia Mother    Heart disease Mother    Cancer Mother    Obesity Mother    Hypertension Father    Heart attack Father        died at 36   Sudden death Father    Hypertension Brother        MI   Prostate cancer Brother 60   Hypertension Brother        MI   Lung cancer Brother        possible lung cancer, spot found on lung being monitored   Hypertension Brother        MI   Non-Hodgkin's lymphoma Sister 11   Breast cancer Other 22       is currently 46   Breast cancer Other 46   Cancer Cousin 62       type of cancer unk.    Cancer Other        type unknown,  eye/face    Cancer Other 65       died from cancer in his 39's, type unknown    Social History:  Social History   Tobacco Use   Smoking status: Never   Smokeless tobacco: Never  Vaping Use   Vaping status: Never Used  Substance Use Topics  Alcohol use: No   Drug use: No    Review of symptoms:  Constitutional:  Negative for unexplained weight loss, night sweats, fever, chills ENT:  Negative for nose bleeds, sinus pain, painful swallowing CV:  Negative for chest pain, shortness of breath, exercise intolerance, palpitations, loss of consciousness Resp:  Negative for cough, wheezing, shortness of breath GI:  Negative for nausea, vomiting, diarrhea, bloody stools GU:  Positives noted in HPI; otherwise negative for gross hematuria, dysuria Neuro:  Negative for seizures, poor balance, limb weakness, slurred speech Psych:  Negative for lack of energy, depression, anxiety Endocrine:  Negative for polydipsia, polyuria, symptoms of hypoglycemia (dizziness, hunger, sweating) Hematologic:  Negative for anemia, purpura, petechia, prolonged or excessive bleeding, use of anticoagulants  Allergic:  Negative for difficulty breathing or choking as a result of exposure to anything; no shellfish allergy; no allergic response (rash/itch) to materials, foods  Physical exam: BP (!) 189/73   Pulse 62   Ht 5' 3 (1.6 m)   Wt 220 lb (99.8 kg)   BMI 38.97 kg/m  GENERAL APPEARANCE:  Well appearing, well developed, well nourished, NAD HEENT: Atraumatic, Normocephalic, oropharynx clear. NECK: Supple without lymphadenopathy or thyromegaly. LUNGS: Clear to auscultation bilaterally. HEART: Regular Rate and Rhythm without murmurs, gallops, or rubs. ABDOMEN: Soft, non-tender, No Masses. EXTREMITIES: Moves all extremities well.  Without clubbing, cyanosis, or edema. NEUROLOGIC:  Alert and oriented x 3,  CN II-XII grossly intact.  MENTAL STATUS:  Appropriate. BACK:  Non-tender to palpation.  No CVAT SKIN:   Warm, dry and intact.    Results: U/A: 0-5 WBCs, 3-10 RBCs, few bacteria  PVR = 158 ml

## 2024-08-14 ENCOUNTER — Emergency Department (HOSPITAL_BASED_OUTPATIENT_CLINIC_OR_DEPARTMENT_OTHER)

## 2024-08-14 ENCOUNTER — Emergency Department (HOSPITAL_BASED_OUTPATIENT_CLINIC_OR_DEPARTMENT_OTHER)
Admission: EM | Admit: 2024-08-14 | Discharge: 2024-08-14 | Disposition: A | Discharge status: Home / Self Care | Attending: Emergency Medicine | Admitting: Emergency Medicine

## 2024-08-14 ENCOUNTER — Other Ambulatory Visit: Payer: Self-pay

## 2024-08-14 ENCOUNTER — Encounter (HOSPITAL_BASED_OUTPATIENT_CLINIC_OR_DEPARTMENT_OTHER): Payer: Self-pay

## 2024-08-14 DIAGNOSIS — W07XXXA Fall from chair, initial encounter: Secondary | ICD-10-CM | POA: Diagnosis not present

## 2024-08-14 DIAGNOSIS — W19XXXA Unspecified fall, initial encounter: Secondary | ICD-10-CM

## 2024-08-14 DIAGNOSIS — S0993XA Unspecified injury of face, initial encounter: Secondary | ICD-10-CM | POA: Diagnosis present

## 2024-08-14 DIAGNOSIS — Z7982 Long term (current) use of aspirin: Secondary | ICD-10-CM | POA: Insufficient documentation

## 2024-08-14 DIAGNOSIS — R58 Hemorrhage, not elsewhere classified: Secondary | ICD-10-CM

## 2024-08-14 DIAGNOSIS — S0083XA Contusion of other part of head, initial encounter: Secondary | ICD-10-CM | POA: Diagnosis not present

## 2024-08-14 NOTE — ED Provider Notes (Signed)
 [place] [facility]PARTMENT AT [facility] Provider Note   CSN: [id number] Arrival date & time: 08/14/24  0045     Patient presents with: Head Injury   Kiara Brown is a 83 y.o. female.   The history is provided by the patient.  Head Injury Location:  Frontal Time since incident: hours. Mechanism of injury: fall   Mechanism of injury comment:  Out of chair striking head on ground Fall:    Impact surface:  Hard floor   Point of impact:  Head   Entrapped after fall: no   Pain details:    Severity:  Mild   Progression:  Unchanged Chronicity:  New Relieved by:  Nothing Worsened by:  Nothing Ineffective treatments:  Ice Associated symptoms: no blurred vision, no difficulty breathing, no neck pain and no vomiting   Risk factors: being elderly        Prior to Admission medications   Medication Sig Start Date End Date Taking? Authorizing Provider  acetaminophen  (TYLENOL ) 500 MG tablet Take 500 mg by mouth every 6 (six) hours as needed for moderate pain or headache.     [provider]  aspirin  EC 81 MG tablet Take 81 mg by mouth daily after breakfast.     [provider]  atenolol  (TENORMIN ) 50 MG tablet Take 50 mg by mouth daily before breakfast.     [provider]  buPROPion  (WELLBUTRIN  XL) 150 MG 24 hr tablet Take 150 mg by mouth daily before breakfast.    [provider]  Docusate Calcium  (STOOL SOFTENER PO) Take by mouth as needed.    [provider]  fexofenadine  (ALLEGRA ) 180 MG tablet Take 1 tablet (180 mg total) by mouth daily. Patient not taking: Reported on 07/16/2024 08/21/21   Midge Sober, DO  folic acid  (FOLVITE ) 1 MG tablet Take 1 mg by mouth daily after breakfast.     [provider]  Meth-Hyo-M Bl-Na Phos-Ph Sal (URO-MP PO) Take by mouth as needed.    [provider]  methenamine  (HIPREX ) 1 g tablet Take 1 tablet (1 g total) by mouth 2 (two) times daily with a meal. 07/16/24    Stoneking, Adine PARAS., MD  omeprazole  (PRILOSEC) 40 MG capsule Take 40 mg by mouth every morning.    [provider]  polyethylene glycol (MIRALAX ) 17 g packet Take 17 g by mouth daily. 04/30/22   Kommor, Madison, MD  traMADol  (ULTRAM ) 50 MG tablet Take 50 mg by mouth every 6 (six) hours as needed for moderate pain or severe pain.     [provider]  traZODone  (DESYREL ) 50 MG tablet Take 50 mg by mouth at bedtime.  04/09/19   [provider]    Allergies: Gabapentin , Pyridium  [phenazopyridine  hcl], Atorvastatin , Duloxetine , Nsaids, and Oseltamivir    Review of Systems  Constitutional:  Negative for fever.  Eyes:  Negative for blurred vision.  Gastrointestinal:  Negative for vomiting.  Musculoskeletal:  Negative for neck pain.  Neurological:  Negative for weakness.  All other systems reviewed and are negative.   Updated Vital Signs BP (!) 187/75   Pulse (!) 52   Temp (P) 98 F (36.7 C) (Oral)   Resp 16   SpO2 97%   Physical Exam Vitals and nursing note reviewed. Exam conducted with a chaperone present.  Constitutional:      General: She is not in acute distress.    Appearance: She is well-developed.  HENT:     Head: Normocephalic.  Nose: Nose normal.  Eyes:     Pupils: Pupils are equal, round, and reactive to light.  Cardiovascular:     Rate and Rhythm: Normal rate and regular rhythm.     Pulses: Normal pulses.     Heart sounds: Normal heart sounds.  Pulmonary:     Effort: Pulmonary effort is normal. No respiratory distress.     Breath sounds: Normal breath sounds.  Abdominal:     General: Bowel sounds are normal. There is no distension.     Palpations: Abdomen is soft.     Tenderness: There is no abdominal tenderness. There is no guarding or rebound.  Musculoskeletal:        General: Normal range of motion.     Cervical back: Normal range of motion and neck supple. No tenderness.     Right knee: No MCL laxity, ACL laxity or PCL laxity.  Normal patellar mobility.     Left knee: No MCL laxity, ACL laxity or PCL laxity.Normal patellar mobility.     Right lower leg: Normal.     Left lower leg: Normal.     Right ankle:     Right Achilles Tendon: Normal.     Left ankle:     Left Achilles Tendon: Normal.  Skin:    General: Skin is warm and dry.     Capillary Refill: Capillary refill takes less than 2 seconds.     Findings: No erythema or rash.  Neurological:     General: No focal deficit present.     Mental Status: She is oriented to person, place, and time.     Deep Tendon Reflexes: Reflexes normal.     (all labs ordered are listed, but only abnormal results are displayed) Labs Reviewed - No data to display  EKG: None  Radiology: No results found.   Procedures   Medications Ordered in the ED - No data to display                                  Medical Decision Making Patient fell out of a chair at hospice and hit head.  No thinners no LOC  Amount and/or Complexity of Data Reviewed Independent Historian:     Details: Daughter see above  External Data Reviewed: notes.    Details: Previous notes reviewed  Radiology: ordered and independent interpretation performed.    Details: Negative head CT  Risk Risk Details: Patient is well appearing with negative imaging.  Alternate tylenol  and ibuprofen.  Ice the area.  Stable for discharge with close follow up     Final diagnoses:  Fall, initial encounter  Ecchymosis   No signs of systemic illness or infection. The patient is nontoxic-appearing on exam and vital signs are within normal limits.  I have reviewed the triage vital signs and the nursing notes. Pertinent labs & imaging results that were available during my care of the patient were reviewed by me and considered in my medical decision making (see chart for details). After history, exam, and medical workup I feel the patient has been appropriately medically screened and is safe for discharge home.  Pertinent diagnoses were discussed with the patient. Patient was given return precautions.    ED Discharge Orders     None          Adalaya Irion, MD 08/14/24 [phone]

## 2024-08-14 NOTE — ED Notes (Signed)
 ED Provider at bedside.

## 2024-08-14 NOTE — ED Triage Notes (Signed)
 Pt is coming in after she had fallen asleep in the chair while she was with her husband in hospice. She has a large goose egg bump on her forehead with some pain to the area. She is not on blood thinners, and has taken tylenol  PTA around ago.

## 2024-08-21 ENCOUNTER — Other Ambulatory Visit

## 2024-08-21 ENCOUNTER — Encounter: Payer: Self-pay | Admitting: Urology

## 2024-08-21 ENCOUNTER — Other Ambulatory Visit: Payer: Self-pay

## 2024-08-21 ENCOUNTER — Telehealth: Payer: Self-pay

## 2024-08-21 DIAGNOSIS — N390 Urinary tract infection, site not specified: Secondary | ICD-10-CM

## 2024-08-21 MED ORDER — NITROFURANTOIN MONOHYD MACRO 100 MG PO CAPS: 100.0000 mg | ORAL_CAPSULE | Freq: Two times a day (BID) | ORAL | 0 refills | Status: AC

## 2024-08-21 NOTE — Telephone Encounter (Signed)
 Pt called stating she feels like she has another UTI. Pt c/o dysuria and frequency. Pt stated she would like to give a urine today so she can get relief soon. Pt added to lab schedule. Reinforced with pt Dr. Roseann is not here today and therefore she will not be able to be seen but can do a lab visit. Pt voiced understanding.

## 2024-08-25 ENCOUNTER — Ambulatory Visit: Payer: Self-pay | Admitting: Urology

## 2024-08-25 LAB — URINE CULTURE

## 2024-08-27 LAB — URINALYSIS, ROUTINE W REFLEX MICROSCOPIC
Bilirubin, UA: NEGATIVE
Glucose, UA: NEGATIVE
Ketones, UA: NEGATIVE
Nitrite, UA: NEGATIVE
Specific Gravity, UA: 1.015 (ref 1.005–1.030)
Urobilinogen, Ur: 0.2 mg/dL (ref 0.2–1.0)
pH, UA: 6 (ref 5.0–7.5)

## 2024-08-27 LAB — MICROSCOPIC EXAMINATION: WBC, UA: >30 /HPF — AB (ref 0–5)

## 2024-09-03 ENCOUNTER — Ambulatory Visit: Admitting: Urology

## 2024-09-03 ENCOUNTER — Encounter: Payer: Self-pay | Admitting: Urology

## 2024-09-03 VITALS — BP 201/80 | HR 62

## 2024-09-03 DIAGNOSIS — Z87440 Personal history of urinary (tract) infections: Secondary | ICD-10-CM | POA: Diagnosis not present

## 2024-09-03 DIAGNOSIS — Z09 Encounter for follow-up examination after completed treatment for conditions other than malignant neoplasm: Secondary | ICD-10-CM

## 2024-09-03 DIAGNOSIS — N390 Urinary tract infection, site not specified: Secondary | ICD-10-CM

## 2024-09-03 LAB — MICROSCOPIC EXAMINATION

## 2024-09-03 LAB — URINALYSIS, ROUTINE W REFLEX MICROSCOPIC
Bilirubin, UA: NEGATIVE
Glucose, UA: NEGATIVE
Ketones, UA: NEGATIVE
Nitrite, UA: NEGATIVE
Protein,UA: NEGATIVE
RBC, UA: NEGATIVE
Specific Gravity, UA: <=1.005 — AB (ref 1.005–1.030)
Urobilinogen, Ur: 0.2 mg/dL (ref 0.2–1.0)
pH, UA: 5.5 (ref 5.0–7.5)

## 2024-09-03 MED ORDER — CEFDINIR 300 MG PO CAPS: 300.0000 mg | ORAL_CAPSULE | Freq: Two times a day (BID) | ORAL | 1 refills | Status: DC

## 2024-09-03 NOTE — Progress Notes (Signed)
 Assessment: 1. Frequent UTI     Plan: Continue methods to reduce the risk of UTIs  including timed and double voiding, increase fluid intake, daily cranberry supplement, and daily probiotic. Given her history of breast cancer, we will avoid use of vaginal hormone cream. Continue methenamine  1 g twice daily. Continue timed and double voiding to ensure bladder emptying. Prescription for cefdinir 300 mg twice daily x 7 days provided for as needed use. Return to office in 6 weeks.  Chief Complaint:  Chief Complaint  Patient presents with   Frequent UTI    History of Present Illness:  Kiara Brown is a 83 y.o. female who is seen for further evaluation of history of UTIs. She was previously seen by Dr. Steen with [facility] for evaluation of recurrent UTIs.  She was managed with self start Macrobid .  She was last seen there in June 2025. She is status post a retropubic mid urethral sling for management of stress urinary incontinence.  She had vaginal extrusion of the sling resulting in occasional vaginal spotting.  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  She has been managed with Arimidex . Typical UTI symptoms include dysuria, frequency, urgency, and low back pain.  CT abdomen pelvis from 6/23 showed no renal or ureteral calculi, no evidence of obstruction, bilateral renal cyst.  Urine culture results: 2/25 >100 K E. Coli 4/25 >100 K E. Coli 7/25 60-100 K E. Coli  At the time of her initial visit in August 2025, she had recently completed a course of Macrobid  for treatment of a UTI.  She was not having any UTI symptoms.  She continued on a probiotic.  No dysuria or gross hematuria.  She was started on methenamine  1 g twice daily in August 2025. She developed UTI symptoms with dysuria and frequency in late September 2025. Urine culture from 08/21/2024:  >100 K E. coli.  Treated with Macrobid .  She returns today for follow-up.  She completed the Macrobid .  She has resumed the methenamine  twice daily.  No UTI symptoms today.  She does continue to have symptoms of frequency and urgency.   Portions of the above documentation were copied from a prior visit for review purposes only.   Past Medical History:  Past Medical History:  Diagnosis Date   Anemia    Arthritis    osteo, rheumatoid   Back pain    Breast cancer (HCC)    Cancer (HCC)    Depression 02/22/2020   Diabetes (HCC)    Diverticulosis    Dysrhythmia    hx rapid heartbeat, no cardiologist   Family history of breast cancer    Family history of prostate cancer    Fibromyalgia    peripheral neuropathy   Fibromyalgia    Fibromyalgia    Gallbladder problem    GERD (gastroesophageal reflux disease)    H/O hiatal hernia    Hyperlipidemia    Joint pain    Lower extremity edema    Obesity    Osteoarthritis    PONV (postoperative nausea and vomiting)    THROAT WITH EXTREME [id number], ulnar nerve surgery   Rheumatoid arthritis (HCC)    RLS (restless legs syndrome)    Sciatica    Shortness of breath dyspnea    with exertion   Slipped disc in neck    UTI (lower urinary tract infection)     Past Surgical History:  Past Surgical History:  Procedure Laterality Date   ABDOMINAL HYSTERECTOMY     APPENDECTOMY     back injection      July 2018- x1   BREAST SURGERY Left 1986   biopsy, Recent 05/2017 at [facility]- had biopsy on R breast- malignant    CARDIAC CATHETERIZATION  2009   no blockage per pt   CHOLECYSTECTOMY     CYSTOSCOPY WITH BIOPSY     INCONTINENCE SURGERY     IRRIGATION AND DEBRIDEMENT ABSCESS Right 01/28/2018   Procedure: IRRIGATION AND DEBRIDEMENT AXILLARY ABSCESS;  Surgeon: Mikell Katz, MD;  Location: [facility];  Service: General;  Laterality: Right;   JOINT REPLACEMENT     KNEE ARTHROSCOPY     bilateral   KNEE ARTHROSCOPY Left 03/17/2014    Procedure: ARTHROSCOPY LEFT KNEE WITH SYNOVECTOMY;  Surgeon: Dempsey LULLA Moan, MD;  Location: [facility];  Service: Orthopedics;  Laterality: Left;   PORTACATH PLACEMENT Left 07/02/2017   Procedure: INSERTION PORT-A-CATH;  Surgeon: Mikell Katz, MD;  Location: [facility];  Service: General;  Laterality: Left;   RIGHT/LEFT HEART CATH AND CORONARY ANGIOGRAPHY N/A 08/30/2017   Procedure: RIGHT/LEFT HEART CATH AND CORONARY ANGIOGRAPHY;  Surgeon: Rolan Ezra RAMAN, MD;  Location: [facility] INVASIVE CV LAB;  Service: Cardiovascular;  Laterality: N/A;   ROTATOR CUFF REPAIR     right   SIMPLE MASTECTOMY WITH AXILLARY SENTINEL NODE BIOPSY Right 07/02/2017   Procedure: RIGHT TOTAL  MASTECTOMY WITH AXILLARY SENTINEL LYMPH  NODE BIOPSY;  Surgeon: Mikell Katz, MD;  Location: [facility];  Service: General;  Laterality: Right;   TOTAL KNEE ARTHROPLASTY  08/04/2012   Procedure: TOTAL KNEE ARTHROPLASTY;  Surgeon: Dempsey LULLA Moan, MD;  Location: [facility];  Service: Orthopedics;  Laterality: Left;   TOTAL KNEE ARTHROPLASTY Right 04/11/2015   Procedure: RIGHT TOTAL KNEE ARTHROPLASTY;  Surgeon: Dempsey Moan, MD;  Location: [facility];  Service: Orthopedics;  Laterality: Right;   ulnar nerve surgery Right 1986    Allergies:  Allergies  Allergen Reactions   Gabapentin  Anxiety   Pyridium  [Phenazopyridine  Hcl]     Nausea  Vomiting diarrhea   Atorvastatin  Other (See Comments)    myalgias    Duloxetine  Other (See Comments)    UNSPECIFIED REACTION    Nsaids Rash   Oseltamivir Diarrhea and Other (See Comments)    Abdominal pain     Family History:  Family History  Problem Relation Age of Onset   Hypertension Mother    Stroke Mother    Breast cancer Mother 50       died at 47   Diabetes Mother    Hyperlipidemia Mother    Heart disease Mother    Cancer Mother    Obesity Mother    Hypertension Father    Heart attack Father        died at 20   Sudden death Father    Hypertension Brother        MI   Prostate cancer Brother  92   Hypertension Brother        MI   Lung cancer Brother        possible lung cancer, spot found on lung being monitored   Hypertension Brother        MI   Non-Hodgkin's lymphoma Sister 85   Breast cancer Other 60       is currently 89   Breast cancer Other 79   Cancer Cousin 62       type of cancer unk.  Cancer Other        type unknown, eye/face    Cancer Other 65       died from cancer in his 28's, type unknown    Social History:  Social History   Tobacco Use   Smoking status: Never   Smokeless tobacco: Never  Vaping Use   Vaping status: Never Used  Substance Use Topics   Alcohol use: No   Drug use: No    ROS: Constitutional:  Negative for fever, chills, weight loss CV: Negative for chest pain, previous MI, hypertension Respiratory:  Negative for shortness of breath, wheezing, sleep apnea, frequent cough GI:  Negative for nausea, vomiting, bloody stool, GERD  Physical exam: BP (!) 201/80   Pulse 62  GENERAL APPEARANCE:  Well appearing, well developed, well nourished, NAD HEENT:  Atraumatic, normocephalic, oropharynx clear NECK:  Supple without lymphadenopathy or thyromegaly ABDOMEN:  Soft, non-tender, no masses EXTREMITIES:  Moves all extremities well, without clubbing, cyanosis, or edema NEUROLOGIC:  Alert and oriented x 3, normal gait, CN II-XII grossly intact MENTAL STATUS:  appropriate BACK:  Non-tender to palpation, No CVAT SKIN:  Warm, dry, and intact  Results: U/A: 0-5 to BCs, 0-2 RBCs

## 2024-10-15 ENCOUNTER — Encounter: Payer: Self-pay | Admitting: Urology

## 2024-10-15 ENCOUNTER — Ambulatory Visit: Admitting: Urology

## 2024-10-15 VITALS — BP 170/80 | HR 56

## 2024-10-15 DIAGNOSIS — Z09 Encounter for follow-up examination after completed treatment for conditions other than malignant neoplasm: Secondary | ICD-10-CM

## 2024-10-15 DIAGNOSIS — Z87440 Personal history of urinary (tract) infections: Secondary | ICD-10-CM | POA: Diagnosis not present

## 2024-10-15 DIAGNOSIS — N390 Urinary tract infection, site not specified: Secondary | ICD-10-CM

## 2024-10-15 LAB — URINALYSIS, ROUTINE W REFLEX MICROSCOPIC
Bilirubin, UA: NEGATIVE
Glucose, UA: NEGATIVE
Ketones, UA: NEGATIVE
Leukocytes,UA: NEGATIVE
Nitrite, UA: NEGATIVE
Protein,UA: NEGATIVE
RBC, UA: NEGATIVE
Specific Gravity, UA: 1.01 (ref 1.005–1.030)
Urobilinogen, Ur: 0.2 mg/dL (ref 0.2–1.0)
pH, UA: 5.5 (ref 5.0–7.5)

## 2024-10-15 NOTE — Progress Notes (Signed)
 Assessment: 1. Frequent UTI     Plan: Continue methods to reduce the risk of UTIs  including timed and double voiding, increase fluid intake, daily cranberry supplement, and daily probiotic. Given her history of breast cancer, we will avoid use of vaginal hormone cream. Continue methenamine  1 g twice daily. Continue timed and double voiding to ensure bladder emptying. Return to office in 3 months  Chief Complaint:  Chief Complaint  Patient presents with   Frequent UTI    History of Present Illness:  Kiara Brown is a 83 y.o. female who is seen for further evaluation of history of UTIs. She was previously seen by Dr. Steen with [facility] for evaluation of recurrent UTIs.  She was managed with self start Macrobid .  She was last seen there in June 2025. She is status post a retropubic mid urethral sling for management of stress urinary incontinence.  She had vaginal extrusion of the sling resulting in occasional vaginal spotting.  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  She has been managed with Arimidex . Typical UTI symptoms include dysuria, frequency, urgency, and low back pain.  CT abdomen pelvis from 6/23 showed no renal or ureteral calculi, no evidence of obstruction, bilateral renal cyst.  Urine culture results: 2/25 >100 K E. Coli 4/25 >100 K E. Coli 7/25 60-100 K E. Coli  At the time of her initial visit in August 2025, she had recently completed a course of Macrobid  for treatment of a UTI.  She was not having any UTI symptoms.  She continued on a probiotic.  No dysuria or gross hematuria.  She was started on methenamine  1 g twice daily in August 2025. She developed UTI symptoms with dysuria and frequency in late September 2025. Urine culture from 08/21/2024: >100 K E. coli.  Treated with Macrobid .  She returns today for follow-up.  She  continues on methenamine  twice daily.  She has not had any recent UTI symptoms.  She continues with frequency and urgency.  No dysuria or gross hematuria.  She has not used the cefdinir since her last visit.  Portions of the above documentation were copied from a prior visit for review purposes only.   Past Medical History:  Past Medical History:  Diagnosis Date   Anemia    Arthritis    osteo, rheumatoid   Back pain    Breast cancer (HCC)    Cancer (HCC)    Depression 02/22/2020   Diabetes (HCC)    Diverticulosis    Dysrhythmia    hx rapid heartbeat, no cardiologist   Family history of breast cancer    Family history of prostate cancer    Fibromyalgia    peripheral neuropathy   Fibromyalgia    Fibromyalgia    Gallbladder problem    GERD (gastroesophageal reflux disease)    H/O hiatal hernia    Hyperlipidemia    Joint pain    Lower extremity edema    Obesity    Osteoarthritis    PONV (postoperative nausea and vomiting)    THROAT WITH EXTREME [id number], ulnar nerve surgery   Rheumatoid arthritis (HCC)    RLS (restless legs syndrome)    Sciatica    Shortness of breath dyspnea    with exertion   Slipped disc in neck    UTI (lower urinary tract infection)     Past Surgical History:  Past Surgical History:  Procedure Laterality Date   ABDOMINAL HYSTERECTOMY     APPENDECTOMY     back injection      July 2018- x1   BREAST SURGERY Left 1986   biopsy, Recent 05/2017 at [facility]- had biopsy on R breast- malignant    CARDIAC CATHETERIZATION  2009   no blockage per pt   CHOLECYSTECTOMY     CYSTOSCOPY WITH BIOPSY     INCONTINENCE SURGERY     IRRIGATION AND DEBRIDEMENT ABSCESS Right 01/28/2018   Procedure: IRRIGATION AND DEBRIDEMENT AXILLARY ABSCESS;  Surgeon: Mikell Katz, MD;  Location: [facility];  Service: General;  Laterality: Right;   JOINT REPLACEMENT     KNEE ARTHROSCOPY     bilateral   KNEE ARTHROSCOPY Left 03/17/2014   Procedure: ARTHROSCOPY LEFT KNEE WITH  SYNOVECTOMY;  Surgeon: Dempsey LULLA Moan, MD;  Location: [facility];  Service: Orthopedics;  Laterality: Left;   PORTACATH PLACEMENT Left 07/02/2017   Procedure: INSERTION PORT-A-CATH;  Surgeon: Mikell Katz, MD;  Location: [facility];  Service: General;  Laterality: Left;   RIGHT/LEFT HEART CATH AND CORONARY ANGIOGRAPHY N/A 08/30/2017   Procedure: RIGHT/LEFT HEART CATH AND CORONARY ANGIOGRAPHY;  Surgeon: Rolan Ezra RAMAN, MD;  Location: [facility] INVASIVE CV [facility];  Service: Cardiovascular;  Laterality: N/A;   ROTATOR CUFF REPAIR     right   SIMPLE MASTECTOMY WITH AXILLARY SENTINEL NODE BIOPSY Right 07/02/2017   Procedure: RIGHT TOTAL  MASTECTOMY WITH AXILLARY SENTINEL LYMPH  NODE BIOPSY;  Surgeon: Mikell Katz, MD;  Location: [facility];  Service: General;  Laterality: Right;   TOTAL KNEE ARTHROPLASTY  08/04/2012   Procedure: TOTAL KNEE ARTHROPLASTY;  Surgeon: Dempsey LULLA Moan, MD;  Location: [facility];  Service: Orthopedics;  Laterality: Left;   TOTAL KNEE ARTHROPLASTY Right 04/11/2015   Procedure: RIGHT TOTAL KNEE ARTHROPLASTY;  Surgeon: Dempsey Moan, MD;  Location: [facility];  Service: Orthopedics;  Laterality: Right;   ulnar nerve surgery Right 1986    Allergies:  Allergies  Allergen Reactions   Gabapentin  Anxiety   Pyridium  [Phenazopyridine  Hcl]     Nausea  Vomiting diarrhea   Atorvastatin  Other (See Comments)    myalgias    Duloxetine  Other (See Comments)    UNSPECIFIED REACTION    Sulfamethoxazole -Trimethoprim  Diarrhea and Nausea Only   Nsaids Rash   Oseltamivir Diarrhea and Other (See Comments)    Abdominal pain     Family History:  Family History  Problem Relation Age of Onset   Hypertension Mother    Stroke Mother    Breast cancer Mother 80       died at 41   Diabetes Mother    Hyperlipidemia Mother    Heart disease Mother    Cancer Mother    Obesity Mother    Hypertension Father    Heart attack Father        died at 20   Sudden death Father    Hypertension Brother        MI    Prostate cancer Brother 66   Hypertension Brother        MI   Lung cancer Brother        possible lung cancer, spot found on lung being monitored   Hypertension Brother        MI   Non-Hodgkin's lymphoma Sister 52   Breast cancer Other 81       is currently 55   Breast cancer Other 74   Cancer Cousin 62       type of  cancer unk.    Cancer Other        type unknown, eye/face    Cancer Other 65       died from cancer in his 9's, type unknown    Social History:  Social History   Tobacco Use   Smoking status: Never   Smokeless tobacco: Never  Vaping Use   Vaping status: Never Used  Substance Use Topics   Alcohol use: No   Drug use: No    ROS: Constitutional:  Negative for fever, chills, weight loss CV: Negative for chest pain, previous MI, hypertension Respiratory:  Negative for shortness of breath, wheezing, sleep apnea, frequent cough GI:  Negative for nausea, vomiting, bloody stool, GERD  Physical exam: BP (!) 170/80   Pulse (!) 56  GENERAL APPEARANCE:  Well appearing, well developed, well nourished, NAD HEENT:  Atraumatic, normocephalic, oropharynx clear NECK:  Supple without lymphadenopathy or thyromegaly ABDOMEN:  Soft, non-tender, no masses EXTREMITIES:  Moves all extremities well, without clubbing, cyanosis, or edema NEUROLOGIC:  Alert and oriented x 3, normal gait, CN II-XII grossly intact MENTAL STATUS:  appropriate BACK:  Non-tender to palpation, No CVAT SKIN:  Warm, dry, and intact  Results: U/A: negative

## 2024-10-21 ENCOUNTER — Other Ambulatory Visit: Payer: Self-pay

## 2024-10-21 ENCOUNTER — Emergency Department (HOSPITAL_BASED_OUTPATIENT_CLINIC_OR_DEPARTMENT_OTHER)

## 2024-10-21 ENCOUNTER — Emergency Department (HOSPITAL_BASED_OUTPATIENT_CLINIC_OR_DEPARTMENT_OTHER): Admission: EM | Admit: 2024-10-21 | Discharge: 2024-10-21 | Disposition: A | Discharge status: Home / Self Care

## 2024-10-21 ENCOUNTER — Encounter (HOSPITAL_BASED_OUTPATIENT_CLINIC_OR_DEPARTMENT_OTHER): Payer: Self-pay

## 2024-10-21 DIAGNOSIS — Z7982 Long term (current) use of aspirin: Secondary | ICD-10-CM | POA: Diagnosis not present

## 2024-10-21 DIAGNOSIS — W01198A Fall on same level from slipping, tripping and stumbling with subsequent striking against other object, initial encounter: Secondary | ICD-10-CM | POA: Insufficient documentation

## 2024-10-21 DIAGNOSIS — W19XXXA Unspecified fall, initial encounter: Secondary | ICD-10-CM

## 2024-10-21 DIAGNOSIS — S0990XA Unspecified injury of head, initial encounter: Secondary | ICD-10-CM | POA: Diagnosis present

## 2024-10-21 DIAGNOSIS — S0003XA Contusion of scalp, initial encounter: Secondary | ICD-10-CM | POA: Diagnosis not present

## 2024-10-21 NOTE — Discharge Instructions (Addendum)
 You have some chronic changes in the cervical spine but this nothing to do for this at this point in time.  There is no evidence of acute fracture of your cervical spine.  No evidence of a Bleed in your head.  If anything changes come back  to the ED for further evaluation.

## 2024-10-21 NOTE — ED Provider Notes (Signed)
 [place] [facility] Provider Note   CSN: [id number] Arrival date & time: 10/21/24  1742     Patient presents with: Kiara Brown is a 83 y.o. female.    Fall Pertinent negatives include no chest pain, no abdominal pain and no shortness of breath.   Patient presents because of fall.  Patient states that she just tripped over her own feet.  Hit her head.  Takes no anticoagulation.  Denies any cervical thoracic or lumbar pain.  No loss of conscious.  Able to ambulate following this incident.  Denies any chest pain or shortness of breath.  No nausea from diarrhea.  Been feeling normal state of health.  Patient's adamant that she just tripped on her feet.    Previous medical history reviewed : Patient last seen in the ED on August 14, 2024.  Seen because of head injury after fall.   Prior to Admission medications   Medication Sig Start Date End Date Taking? Authorizing Provider  acetaminophen  (TYLENOL ) 500 MG tablet Take 500 mg by mouth every 6 (six) hours as needed for moderate pain or headache.     [provider]  aspirin  EC 81 MG tablet Take 81 mg by mouth daily after breakfast.     [provider]  atenolol  (TENORMIN ) 50 MG tablet Take 50 mg by mouth daily before breakfast.     [provider]  buPROPion  (WELLBUTRIN  XL) 150 MG 24 hr tablet Take 150 mg by mouth daily before breakfast.    [provider]  cefdinir  (OMNICEF ) 300 MG capsule Take 1 capsule (300 mg total) by mouth 2 (two) times daily. Patient not taking: Reported on 10/15/2024 09/03/24   Roseann Adine PARAS., MD  Docusate Calcium  (STOOL SOFTENER PO) Take by mouth as needed.    [provider]  fexofenadine  (ALLEGRA ) 180 MG tablet Take 1 tablet (180 mg total) by mouth daily. 08/21/21   Opalski, Barnie, DO  folic acid  (FOLVITE ) 1 MG tablet Take 1 mg by mouth daily after breakfast.     [provider]  methenamine   (HIPREX ) 1 g tablet Take 1 tablet (1 g total) by mouth 2 (two) times daily with a meal. 07/16/24   Stoneking, Adine PARAS., MD  omeprazole  (PRILOSEC) 40 MG capsule Take 40 mg by mouth every morning.    [provider]  polyethylene glycol (MIRALAX ) 17 g packet Take 17 g by mouth daily. 04/30/22   Kommor, Madison, MD  traMADol  (ULTRAM ) 50 MG tablet Take 50 mg by mouth every 6 (six) hours as needed for moderate pain or severe pain.     [provider]  traZODone  (DESYREL ) 50 MG tablet Take 50 mg by mouth at bedtime.  04/09/19   [provider]    Allergies: Gabapentin , Pyridium  [phenazopyridine  hcl], Atorvastatin , Duloxetine , Sulfamethoxazole -trimethoprim , Nsaids, and Oseltamivir    Review of Systems  Constitutional:  Negative for chills and fever.  HENT:  Negative for ear pain and sore throat.   Eyes:  Negative for pain and visual disturbance.  Respiratory:  Negative for cough and shortness of breath.   Cardiovascular:  Negative for chest pain and palpitations.  Gastrointestinal:  Negative for abdominal pain and vomiting.  Genitourinary:  Negative for dysuria and hematuria.  Musculoskeletal:  Negative for arthralgias and back pain.  Skin:  Negative for color change and rash.  Neurological:  Negative for seizures and syncope.  All other systems reviewed and are negative.  Updated Vital Signs BP (!) 188/72   Pulse (!) 56   Temp 98.4 F (36.9 C)   Resp 17   Ht 5' 3 (1.6 m)   Wt 99.8 kg   SpO2 97%   BMI 38.97 kg/m   Physical Exam Vitals and nursing note reviewed.  Constitutional:      General: She is not in acute distress.    Appearance: She is well-developed.  HENT:     Head: Normocephalic and atraumatic.   Eyes:     Conjunctiva/sclera: Conjunctivae normal.  Cardiovascular:     Rate and Rhythm: Normal rate and regular rhythm.     Heart sounds: No murmur heard. Pulmonary:     Effort: Pulmonary effort is normal. No respiratory distress.     Breath  sounds: Normal breath sounds.  Abdominal:     Palpations: Abdomen is soft.     Tenderness: There is no abdominal tenderness.  Musculoskeletal:        General: No swelling.     Cervical back: Neck supple.  Skin:    General: Skin is warm and dry.     Capillary Refill: Capillary refill takes less than 2 seconds.  Neurological:     Mental Status: She is alert.  Psychiatric:        Mood and Affect: Mood normal.     (all labs ordered are listed, but only abnormal results are displayed) Labs Reviewed - No data to display  EKG: EKG Interpretation Date/Time:  Wednesday October 21 2024 17:59:10 EST Ventricular Rate:  62 PR Interval:  197 QRS Duration:  98 QT Interval:  419 QTC Calculation: 426 R Axis:   6  Text Interpretation: Sinus rhythm Confirmed by Simon Rea [phone]) on 10/21/2024 6:00:50 PM  Radiology: CT Cervical Spine Wo Contrast Result Date: 10/21/2024 EXAM: CT CERVICAL SPINE WITHOUT CONTRAST 10/21/2024 06:34:29 PM TECHNIQUE: CT of the cervical spine was performed without the administration of intravenous contrast. Multiplanar reformatted images are provided for review. Automated exposure control, iterative reconstruction, and/or weight based adjustment of the mA/kV was utilized to reduce the radiation dose to as low as reasonably achievable. COMPARISON: Comparison made with prior CT from 08/14/2024. CLINICAL HISTORY: blunt trauma FINDINGS: CERVICAL SPINE: BONES AND ALIGNMENT: No acute fracture or traumatic malalignment. Straightening with mild reversal of the normal cervical lordosis. Trace degenerative anterolisthesis of C4 on C5 and T7 on T1, with trace retrolisthesis of C5 on C6. DEGENERATIVE CHANGES: Moderate multilevel cervical spondylosis, most pronounced at C5-C6 and C6-C7. SOFT TISSUES: No prevertebral soft tissue swelling. IMPRESSION: 1. No acute osseous abnormality within the cervical spine. 2. Moderate multilevel cervical spondylosis, most pronounced at C5-C6 and  C6-C7. Electronically signed by: Morene Hoard MD 10/21/2024 07:23 PM EST RP Workstation: [id number]   CT Head Wo Contrast Result Date: 10/21/2024 EXAM: CT HEAD WITHOUT 10/21/2024 06:34:29 PM TECHNIQUE: CT of the head was performed without the administration of intravenous contrast. Automated exposure control, iterative reconstruction, and/or weight based adjustment of the mA/kV was utilized to reduce the radiation dose to as low as reasonably achievable. COMPARISON: 08/14/2024 CLINICAL HISTORY: blunt trauma FINDINGS: BRAIN AND VENTRICLES: No acute intracranial hemorrhage. No mass effect or midline shift. No extra-axial fluid collection. No evidence of acute infarct. No hydrocephalus. Generalized cerebral volume loss. Mild periventricular white matter disease. Moderate calcific atheromatous disease. ORBITS: No acute abnormality. Status post bilateral lens replacement. SINUSES AND MASTOIDS: Mild mucosal disease within right ethmoid sinus. SOFT TISSUES AND SKULL: No acute skull fracture. Mild right frontal scalp hematoma.  IMPRESSION: 1. No acute intracranial abnormality related to blunt trauma. 2. Mild right frontal scalp hematoma. Electronically signed by: Morene Hoard MD 10/21/2024 07:10 PM EST RP Workstation: [id number]     Procedures   Medications Ordered in the ED - No data to display                                  Medical Decision Making Amount and/or Complexity of Data Reviewed Radiology: ordered.     HPI:    Patient presents because of fall.  Patient states that she just tripped over her own feet.  Hit her head.  Takes no anticoagulation.  Denies any cervical thoracic or lumbar pain.  No loss of conscious.  Able to ambulate following this incident.  Denies any chest pain or shortness of breath.  No nausea from diarrhea.  Been feeling normal state of health.  Patient's adamant that she just tripped on her feet.    Previous medical history reviewed : Patient last seen  in the ED on August 14, 2024.  Seen because of head injury after fall.   MDM:   Upon exam, patient ANO x 3 GCS 15.  Slightly hypertensive.  Otherwise, vital signs stable.  Purely a mechanical fall.  Patient tripped her own feet.  No prodromal symptoms.  No concerns for cardiogenic syncope or other syncope.  No indication for labs at this time.  EKG shows sinus rhythm at a rate of 62.  No change compared to prior.  Given fall with bruising to the forehead and head strike, will obtain CT head to rule out subdural epidural.  CT C-spine given mechanism of action as well as age.  Neurologically intact.  No concerns for central cord syndrome.  No indication for thoracic or lumbar imaging at this point time given no pain to palpation.  No concerns of compression fracture of this area.   Reevaluation:   Upon reexamination, patient hemodynamically stable.  Remains A&O x 3 with GCS 15.  CT imaging unremarkable.  Sinus rhythm on cardiac telemetry.   EKG sinus rhythm.  No STEMI arrhythmia.  Heart rate in the high 50s to low 60s which is baseline for her.  Patient will follow-up with PCP.        Final diagnoses:  Fall, initial encounter  Contusion of scalp, initial encounter    ED Discharge Orders     None          Simon Lavonia SAILOR, MD 10/21/24 [phone]

## 2024-10-21 NOTE — ED Notes (Signed)
 Pt. Reports she fell onto concrete today hitting the R side of her forehead with no LOC and is slightly dizzy and nauseated.  No vomiting or visual disturbances... Pt. Is able to recall the entire accident and has no trouble with talking,

## 2024-10-21 NOTE — ED Triage Notes (Signed)
 Pt fell at home about 45 minutes ago. Headstrike, no LOC, right forehead hematoma. No dizziness, just tripped. No other injuries.

## 2024-12-25 ENCOUNTER — Other Ambulatory Visit: Payer: Self-pay

## 2024-12-25 ENCOUNTER — Other Ambulatory Visit

## 2024-12-25 ENCOUNTER — Ambulatory Visit: Payer: Self-pay | Admitting: Urology

## 2024-12-25 DIAGNOSIS — N390 Urinary tract infection, site not specified: Secondary | ICD-10-CM

## 2024-12-25 LAB — URINALYSIS, ROUTINE W REFLEX MICROSCOPIC
Bilirubin, UA: NEGATIVE
Glucose, UA: NEGATIVE
Ketones, UA: NEGATIVE
Nitrite, UA: NEGATIVE
Protein,UA: NEGATIVE
Specific Gravity, UA: 1.01 (ref 1.005–1.030)
Urobilinogen, Ur: 0.2 mg/dL (ref 0.2–1.0)
pH, UA: 6 (ref 5.0–7.5)

## 2024-12-25 LAB — MICROSCOPIC EXAMINATION: WBC, UA: >30 /HPF — AB (ref 0–5)

## 2024-12-25 MED ORDER — CEFDINIR 300 MG PO CAPS: 300.0000 mg | ORAL_CAPSULE | Freq: Two times a day (BID) | ORAL | 0 refills | Status: AC

## 2024-12-29 LAB — URINE CULTURE

## 2025-01-06 ENCOUNTER — Ambulatory Visit: Admitting: Urology

## 2025-05-19 ENCOUNTER — Ambulatory Visit: Admitting: Hematology and Oncology
